# Patient Record
Sex: Female | Born: 1940
Health system: Southern US, Community
[De-identification: ages and names within clinical notes are randomized; demographics above are authoritative.]

## PROBLEM LIST (undated history)

## (undated) DIAGNOSIS — M25561 Pain in right knee: Secondary | ICD-10-CM

## (undated) DIAGNOSIS — I5181 Takotsubo syndrome: Secondary | ICD-10-CM

## (undated) DIAGNOSIS — R002 Palpitations: Secondary | ICD-10-CM

## (undated) DIAGNOSIS — H269 Unspecified cataract: Secondary | ICD-10-CM

## (undated) DIAGNOSIS — M1990 Unspecified osteoarthritis, unspecified site: Secondary | ICD-10-CM

## (undated) DIAGNOSIS — T7840XA Allergy, unspecified, initial encounter: Secondary | ICD-10-CM

## (undated) DIAGNOSIS — D649 Anemia, unspecified: Secondary | ICD-10-CM

## (undated) DIAGNOSIS — N3000 Acute cystitis without hematuria: Secondary | ICD-10-CM

## (undated) DIAGNOSIS — R778 Other specified abnormalities of plasma proteins: Secondary | ICD-10-CM

## (undated) DIAGNOSIS — E785 Hyperlipidemia, unspecified: Secondary | ICD-10-CM

## (undated) DIAGNOSIS — G9389 Other specified disorders of brain: Secondary | ICD-10-CM

## (undated) DIAGNOSIS — I10 Essential (primary) hypertension: Secondary | ICD-10-CM

## (undated) DIAGNOSIS — A86 Unspecified viral encephalitis: Secondary | ICD-10-CM

## (undated) DIAGNOSIS — I340 Nonrheumatic mitral (valve) insufficiency: Secondary | ICD-10-CM

## (undated) DIAGNOSIS — I071 Rheumatic tricuspid insufficiency: Secondary | ICD-10-CM

## (undated) DIAGNOSIS — I2510 Atherosclerotic heart disease of native coronary artery without angina pectoris: Secondary | ICD-10-CM

## (undated) DIAGNOSIS — J45909 Unspecified asthma, uncomplicated: Secondary | ICD-10-CM

## (undated) DIAGNOSIS — R7989 Other specified abnormal findings of blood chemistry: Secondary | ICD-10-CM

## (undated) HISTORY — DX: Other specified disorders of brain: G93.89

## (undated) HISTORY — DX: Nonrheumatic mitral (valve) insufficiency: I34.0

## (undated) HISTORY — DX: Unspecified asthma, uncomplicated: J45.909

## (undated) HISTORY — PX: EYE SURGERY: SHX253

## (undated) HISTORY — PX: CHOLECYSTECTOMY: SHX55

## (undated) HISTORY — DX: Allergy, unspecified, initial encounter: T78.40XA

## (undated) HISTORY — DX: Takotsubo syndrome: I51.81

## (undated) HISTORY — DX: Anemia, unspecified: D64.9

## (undated) HISTORY — DX: Pain in right knee: M25.561

## (undated) HISTORY — DX: Acute cystitis without hematuria: N30.00

## (undated) HISTORY — DX: Unspecified cataract: H26.9

## (undated) HISTORY — PX: TUBAL LIGATION: SHX77

## (undated) HISTORY — PX: APPENDECTOMY: SHX54

## (undated) HISTORY — DX: Rheumatic tricuspid insufficiency: I07.1

## (undated) HISTORY — DX: Palpitations: R00.2

## (undated) HISTORY — DX: Essential (primary) hypertension: I10

## (undated) HISTORY — DX: Unspecified viral encephalitis: A86

---

## 1998-08-10 ENCOUNTER — Ambulatory Visit (HOSPITAL_COMMUNITY): Admission: RE | Admit: 1998-08-10 | Discharge: 1998-08-10 | Payer: Self-pay | Admitting: *Deleted

## 1998-08-10 ENCOUNTER — Encounter: Payer: Self-pay | Admitting: *Deleted

## 1998-08-25 ENCOUNTER — Encounter: Payer: Self-pay | Admitting: Surgery

## 1998-08-27 ENCOUNTER — Encounter: Payer: Self-pay | Admitting: Surgery

## 1998-08-27 ENCOUNTER — Observation Stay (HOSPITAL_COMMUNITY): Admission: RE | Admit: 1998-08-27 | Discharge: 1998-08-28 | Payer: Self-pay | Admitting: Surgery

## 1998-08-28 ENCOUNTER — Encounter: Payer: Self-pay | Admitting: Surgery

## 2009-01-09 HISTORY — PX: DOPPLER ECHOCARDIOGRAPHY: SHX263

## 2011-04-29 ENCOUNTER — Ambulatory Visit (INDEPENDENT_AMBULATORY_CARE_PROVIDER_SITE_OTHER): Payer: PRIVATE HEALTH INSURANCE | Admitting: Family Medicine

## 2011-04-29 VITALS — BP 104/70 | HR 102 | Temp 98.6°F | Resp 16 | Ht 59.0 in | Wt 94.0 lb

## 2011-04-29 DIAGNOSIS — J45909 Unspecified asthma, uncomplicated: Secondary | ICD-10-CM

## 2011-04-29 DIAGNOSIS — J683 Other acute and subacute respiratory conditions due to chemicals, gases, fumes and vapors: Secondary | ICD-10-CM

## 2011-04-29 DIAGNOSIS — J019 Acute sinusitis, unspecified: Secondary | ICD-10-CM

## 2011-04-29 DIAGNOSIS — J329 Chronic sinusitis, unspecified: Secondary | ICD-10-CM

## 2011-04-29 MED ORDER — METHYLPREDNISOLONE 4 MG PO TABS: 4.0000 mg | ORAL_TABLET | Freq: Two times a day (BID) | ORAL | Status: AC

## 2011-04-29 MED ORDER — LEVOFLOXACIN 500 MG PO TABS: 500.0000 mg | ORAL_TABLET | Freq: Every day | ORAL | Status: AC

## 2011-04-29 MED ORDER — HYDROCODONE-HOMATROPINE 5-1.5 MG/5ML PO SYRP: 5.0000 mL | ORAL_SOLUTION | Freq: Three times a day (TID) | ORAL | Status: AC | PRN

## 2011-04-29 NOTE — Progress Notes (Signed)
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  Coughs to point of retching. h/o asthma O: NAD HEENT:  Unremarkable except for nasal congestion and small amt left epistaxis Neck: supple Chest:  Exp wheezes Heart:  Reg Ext: no edema  A: sinusitis, allergies with asthma  P: levaquin 500 mg qd x 7 Medrol 4 mg bid x 5 Hydromet

## 2011-04-29 NOTE — Patient Instructions (Signed)
Asthma, Acute Bronchospasm Your exam shows you have asthma, or acute bronchospasm that acts like asthma. Bronchospasm means your air passages become narrowed. These conditions are due to inflammation and airway spasm that cause narrowing of the bronchial tubes in the lungs. This causes you to have wheezing and shortness of breath. CAUSES  Respiratory infections and allergies most often bring on these attacks. Smoking, air pollution, cold air, emotional upsets, and vigorous exercise can also bring them on.  TREATMENT   Treatment is aimed at making the narrowed airways larger. Mild asthma/bronchospasm is usually controlled with inhaled medicines. Albuterol is a common medicine that you breathe in to open spastic or narrowed airways. Some trade names for albuterol are Ventolin or Proventil. Steroid medicine is also used to reduce the inflammation when an attack is moderate or severe. Antibiotics (medications used to kill germs) are only used if a bacterial infection is present.   If you are pregnant and need to use Albuterol (Ventolin or Proventil), you can expect the baby to move more than usual shortly after the medicine is used.  HOME CARE INSTRUCTIONS   Rest.   Drink plenty of liquids. This helps the mucus to remain thin and easily coughed up. Do not use caffeine or alcohol.   Do not smoke. Avoid being exposed to second-hand smoke.   You play a critical role in keeping yourself in good health. Avoid exposure to things that cause you to wheeze. Avoid exposure to things that cause you to have breathing problems. Keep your medications up-to-date and available. Carefully follow your doctor's treatment plan.   When pollen or pollution is bad, keep windows closed and use an air conditioner go to places with air conditioning. If you are allergic to furry pets or birds, find new homes for them or keep them outside.   Take your medicine exactly as prescribed.   Asthma requires careful medical  attention. See your caregiver for follow-up as advised. If you are more than [redacted] weeks pregnant and you were prescribed any new medications, let your Obstetrician know about the visit and how you are doing. Arrange a recheck.  SEEK IMMEDIATE MEDICAL CARE IF:   You are getting worse.   You have trouble breathing. If severe, call 911.   You develop chest pain or discomfort.   You are throwing up or not drinking fluids.   You are not getting better within 24 hours.   You are coughing up yellow, green, brown, or bloody sputum.   You develop a fever over 102 F (38.9 C).   You have trouble swallowing.  MAKE SURE YOU:   Understand these instructions.   Will watch your condition.   Will get help right away if you are not doing well or get worse.  Document Released: 04/12/2006 Document Revised: 12/15/2010 Document Reviewed: 12/10/2006 [facility] Patient Information 2012 [place], [place].Sinusitis Sinuses are air pockets within the bones of your face. The growth of bacteria within a sinus leads to infection. The infection prevents the sinuses from draining. This infection is called sinusitis. SYMPTOMS  There will be different areas of pain depending on which sinuses have become infected.  The maxillary sinuses often produce pain beneath the eyes.   Frontal sinusitis may cause pain in the middle of the forehead and above the eyes.  Other problems (symptoms) include:  Toothaches.   Colored, pus-like (purulent) drainage from the nose.   Swelling, warmth, and tenderness over the sinus areas may be signs of infection.  TREATMENT  Sinusitis  is most often determined by an exam.X-rays may be taken. If x-rays have been taken, make sure you obtain your results or find out how you are to obtain them. Your caregiver may give you medications (antibiotics). These are medications that will help kill the bacteria causing the infection. You may also be given a medication (decongestant) that helps to  reduce sinus swelling.  HOME CARE INSTRUCTIONS   Only take over-the-counter or prescription medicines for pain, discomfort, or fever as directed by your caregiver.   Drink extra fluids. Fluids help thin the mucus so your sinuses can drain more easily.   Applying either moist heat or ice packs to the sinus areas may help relieve discomfort.   Use saline nasal sprays to help moisten your sinuses. The sprays can be found at your local drugstore.  SEEK IMMEDIATE MEDICAL CARE IF:  You have a fever.   You have increasing pain, severe headaches, or toothache.   You have nausea, vomiting, or drowsiness.   You develop unusual swelling around the face or trouble seeing.  MAKE SURE YOU:   Understand these instructions.   Will watch your condition.   Will get help right away if you are not doing well or get worse.  Document Released: 12/26/2004 Document Revised: 12/15/2010 Document Reviewed: 07/25/2006 [facility] Patient Information 2012 [place], [place].

## 2011-07-12 ENCOUNTER — Ambulatory Visit: Payer: PRIVATE HEALTH INSURANCE

## 2011-07-12 ENCOUNTER — Ambulatory Visit (INDEPENDENT_AMBULATORY_CARE_PROVIDER_SITE_OTHER): Payer: PRIVATE HEALTH INSURANCE | Admitting: Family Medicine

## 2011-07-12 VITALS — BP 118/82 | HR 82 | Temp 97.9°F | Resp 16 | Ht <= 58 in | Wt 92.2 lb

## 2011-07-12 DIAGNOSIS — M25559 Pain in unspecified hip: Secondary | ICD-10-CM

## 2011-07-12 DIAGNOSIS — M79673 Pain in unspecified foot: Secondary | ICD-10-CM

## 2011-07-12 DIAGNOSIS — M79609 Pain in unspecified limb: Secondary | ICD-10-CM

## 2011-07-12 DIAGNOSIS — M25579 Pain in unspecified ankle and joints of unspecified foot: Secondary | ICD-10-CM

## 2011-07-12 NOTE — Patient Instructions (Signed)
Your xrays did not indicate a fracture today, however an MRI may be needed to completely exclude this.  Use a cane and keep as much weight off of the R leg for the next 4-5 days, then follow up to discuss next step in treatment.  If any worsening of pain - return sooner to myself or Dr. Cleta Alberts.   Your foot swelling could be a flair of arthritis or sprain. Occasionally superficial thrombophlebitis, or inflammation of the veins on the top of the foot can cause these symptoms. You cna try relative rest, elevation as needed, and warm compresses to this area.  If needed, tylenol or Ibuprofen over the counter temporarily is ok.  Return to the clinic or go to the nearest emergency room if any of your symptoms worsen or new symptoms occur.

## 2011-07-12 NOTE — Progress Notes (Signed)
  Subjective:    Patient ID: Julie Durham, female    DOB: [birth year], 71 y.o.   MRN: [id number]  HPI Julie Durham is a 71 y.o. female S/p fall 1 week ago - fell about 10 days ago working in yard.  Fell backward, landed on side of R hip and caught self with R arm. Foot may may have been turned out when fell.  Straightened out on own.   Able to get up and walk  - no initial pain. Some bruising next day. Soreness in R hip started about a week later.  Pain in front of hip - moves down part of . Has been using cane at times.  No known osteoporosis, last bone density 5-10 years ago.  Very active.    L ankle/foot swelling, sore on top of L foot past 4-5 days.  More swollen yesterday, and sore in top of foot since yesterday.  No known hx of Gout. No known injury.  No calf pain.    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   Review of Systems     Objective:   Physical Exam  Constitutional: She is oriented to person, place, and time. She appears well-developed and well-nourished.  HENT:  Head: Normocephalic and atraumatic.  Cardiovascular: Normal rate, regular rhythm, normal heart sounds and intact distal pulses.   Pulmonary/Chest: Effort normal and breath sounds normal.  Musculoskeletal:       Right hip: She exhibits tenderness. She exhibits normal range of motion (however has pain with terminal hip flexion and internal rotation - reproduces radiating pain from hip to upper leg.) and normal strength.       Legs:      Feet:  Neurological: She is alert and oriented to person, place, and time. No sensory deficit.       NVI distally.   Skin: Skin is warm and dry. No rash noted. No erythema.  Psychiatric: She has a normal mood and affect. Her behavior is normal. Thought content normal.     [facility] reading (PRIMARY) by  Dr. Neva Seat:  R hip - faint linear marking in femoral neck-  perpendicular to cortex.  No apparent fx or displacement otherwise.   L foot/ankle: negative for acute fx.    X ray reports  reviewed.  No fracture.      Assessment & Plan:  Julie Durham is a 71 y.o. female 1. Foot pain  DG Foot Complete Left  2. Hip pain  DG Hip Complete Right  3. Ankle pain  DG Ankle Complete Left   Hip pain. Concerning for fracture based on symptoms, however has been walking on this for the past week to 10 days with minimal limp. Initial x-ray appears negative,  But recommended MRI to rule out fracture.  Patient like to wait for 4-5 more days and if symptoms not improving consider MRI at that point. Discussed risks of possible fracture without treatment.,  understanding expressed.  She plans on minimal activity for the next 4 or 5 days and then a recheck, but will return to clinic in the next few days if any increase in pain.   Ankle pain, foot pain and swelling. Possible flare of degenerative joint disease or strain. Doubt gout no apparent fracture. Trial of postop shoe Tylenol and/or ibuprofen over-the-counter short-term. Recheck for 5 days

## 2011-07-17 ENCOUNTER — Telehealth: Payer: Self-pay

## 2011-07-17 NOTE — Telephone Encounter (Signed)
PT STATES DR Neva Seat WANTED HER TO CALL HIM ABOUT HER HAVING AN MRI AND ALSO ABOUT HER HIP AND FOOT ARE DURING PLEASE CALL [phone]

## 2011-07-18 NOTE — Telephone Encounter (Signed)
Pt reports that she is doing better than when she was here. She is able to put some weight on her hip now, but is still using the cane to continue to give it rest. Also her swelling is gone in her foot, but still has some pain on the top of her foot and will cont to wear the foot support for at least a week. Pt wanted to req that Dr Neva Seat look at her xray again since he thought he saw a possible hairline fx even thought the radiologist didn't see one, and would like a copy of her xray. She would like a CB after Dr Neva Seat rechecks her xray and to let her know if he has to recheck her even though she has improved (she only remembered that she was supposed to call him w/update, not that he wanted to have her RTC. Asking XRAY for a copy for pt.

## 2011-07-18 NOTE — Telephone Encounter (Signed)
LMOM to CB. Xray and report are ready for p/up, and give her message from Dr Neva Seat.

## 2011-07-18 NOTE — Telephone Encounter (Signed)
Pt CB. Gave her message from Dr Neva Seat and she agreed to RTC if she conts to have pain to be assessed for need for MRI. Advised pt that her xray and report are ready for p/up

## 2011-07-18 NOTE — Telephone Encounter (Signed)
X ray report did not indicate a fracture, but with where she was having pain, that is why I recommended an MRI.  If she is still having pain in this area - especially with weightbearing - I would recommend an MRI.  We can provide her a copy of the last xray and report if needed.

## 2011-07-20 ENCOUNTER — Telehealth: Payer: Self-pay

## 2011-07-20 NOTE — Telephone Encounter (Signed)
LMOM for pt at [facility] that handicapped parking application is ready for p/up

## 2012-04-13 ENCOUNTER — Ambulatory Visit (INDEPENDENT_AMBULATORY_CARE_PROVIDER_SITE_OTHER): Payer: PRIVATE HEALTH INSURANCE | Admitting: Emergency Medicine

## 2012-04-13 VITALS — BP 123/82 | HR 112 | Temp 98.0°F | Resp 18 | Wt 85.0 lb

## 2012-04-13 DIAGNOSIS — I479 Paroxysmal tachycardia, unspecified: Secondary | ICD-10-CM

## 2012-04-13 LAB — COMPREHENSIVE METABOLIC PANEL
ALT: 13 U/L (ref 0–35)
AST: 18 U/L (ref 0–37)
Albumin: 4.3 g/dL (ref 3.5–5.2)
Alkaline Phosphatase: 106 U/L (ref 39–117)
BUN: 12 mg/dL (ref 6–23)
CO2: 30 mEq/L (ref 19–32)
Calcium: 9.5 mg/dL (ref 8.4–10.5)
Chloride: 99 mEq/L (ref 96–112)
Creat: 0.65 mg/dL (ref 0.50–1.10)
Glucose, Bld: 93 mg/dL (ref 70–99)
Potassium: 3.8 mEq/L (ref 3.5–5.3)
Sodium: 139 mEq/L (ref 135–145)
Total Bilirubin: 0.4 mg/dL (ref 0.3–1.2)
Total Protein: 7 g/dL (ref 6.0–8.3)

## 2012-04-13 LAB — POCT CBC
Granulocyte percent: 66.9 %G (ref 37–80)
HCT, POC: 43.4 % (ref 37.7–47.9)
Hemoglobin: 13.9 g/dL (ref 12.2–16.2)
Lymph, poc: 1.9 (ref 0.6–3.4)
MCH, POC: 28.8 pg (ref 27–31.2)
MCHC: 32 g/dL (ref 31.8–35.4)
MCV: 89.8 fL (ref 80–97)
MID (cbc): 0.5 (ref 0–0.9)
MPV: 8.3 fL (ref 0–99.8)
POC Granulocyte: 4.7 (ref 2–6.9)
POC LYMPH PERCENT: 26.5 %L (ref 10–50)
POC MID %: 6.6 %M (ref 0–12)
Platelet Count, POC: 339 10*3/uL (ref 142–424)
RBC: 4.83 M/uL (ref 4.04–5.48)
RDW, POC: 12 %
WBC: 7 10*3/uL (ref 4.6–10.2)

## 2012-04-13 LAB — TSH: TSH: 2.265 u[IU]/mL (ref 0.350–4.500)

## 2012-04-13 NOTE — Patient Instructions (Addendum)
Nonspecific Tachycardia  Tachycardia is a faster than normal heartbeat (more than 100 beats per minute). In adults, the heart normally beats between 60 and 100 times a minute. A fast heartbeat may be a normal response to exercise or stress. It does not necessarily mean that something is wrong. However, sometimes when your heart beats too fast it may not be able to pump enough blood to the rest of your body. This can result in chest pain, shortness of breath, dizziness, and even fainting. Nonspecific tachycardia means that the specific cause or pattern of your tachycardia is unknown.  CAUSES   Tachycardia may be harmless or it may be due to a more serious underlying cause. Possible causes of tachycardia include:   Exercise or exertion.   Fever.   Pain or injury.   Infection.   Loss of body fluids (dehydration).   Overactive thyroid.   Lack of red blood cells (anemia).   Anxiety and stress.   Alcohol.   Caffeine.   Tobacco products.   Diet pills.   Illegal drugs.   Heart disease.  SYMPTOMS   Rapid or irregular heartbeat (palpitations).   Suddenly feeling your heart beating (cardiac awareness).   Dizziness.   Tiredness (fatigue).   Shortness of breath.   Chest pain.   Nausea.   Fainting.  DIAGNOSIS   Your caregiver will perform a physical exam and take your medical history. In some cases, a heart specialist (cardiologist) may be consulted. Your caregiver may also order:   Blood tests.   Electrocardiography. This test records the electrical activity of your heart.   A heart monitoring test.  TREATMENT   Treatment will depend on the likely cause of your tachycardia. The goal is to treat the underlying cause of your tachycardia. Treatment methods may include:   Replacement of fluids or blood through an intravenous (IV) tube for moderate to severe dehydration or anemia.   New medicines or changes in your current medicines.   Diet and lifestyle changes.   Treatment for certain  infections.   Stress relief or relaxation methods.  HOME CARE INSTRUCTIONS    Rest.   Drink enough fluids to keep your urine clear or pale yellow.   Do not smoke.   Avoid:   Caffeine.   Tobacco.   Alcohol.   Chocolate.   Stimulants such as over-the-counter diet pills or pills that help you stay awake.   Situations that cause anxiety or stress.   Illegal drugs such as marijuana, phencyclidine (PCP), and cocaine.   Only take medicine as directed by your caregiver.   Keep all follow-up appointments as directed by your caregiver.  SEEK IMMEDIATE MEDICAL CARE IF:    You have pain in your chest, upper arms, jaw, or neck.   You become weak, dizzy, or feel faint.   You have palpitations that will not go away.   You vomit, have diarrhea, or pass blood in your stool.   Your skin is cool, pale, and wet.   You have a fever that will not go away with rest, fluids, and medicine.  MAKE SURE YOU:    Understand these instructions.   Will watch your condition.   Will get help right away if you are not doing well or get worse.  Document Released: 02/03/2004 Document Revised: 03/20/2011 Document Reviewed: 12/06/2010  [employer] Patient Information 2013 [employer].

## 2012-04-13 NOTE — Progress Notes (Signed)
[facility] [address] [place] [address] [phone]  Date:  04/13/2012   Name:  Julie Durham   DOB:  February 20, 1940   MRN:  [id number]  PCP:  Elvina Sidle, MD    Chief Complaint: Hypertension   History of Present Illness:  Julie Durham is a 72 y.o. very pleasant female patient who presents with the following:  Says has intermittent episodes of tachycardia and increased blood pressure.  Says that she has experienced increased symptoms of asthma and shortness of breath. Relies on her rescue inhaler more frequently and thinks that may be related to her symptoms.  Denies antecedent illness or fever or chills. Denies cough or sputum production.  No nausea or vomiting.  No stool change or rash.  No improvement with over the counter medications or other home remedies. Denies other complaint or health concern today.   There is no problem list on file for this patient.   No past medical history on file.  No past surgical history on file.  History  Substance Use Topics  . Smoking status: Never Smoker   . Smokeless tobacco: Not on file  . Alcohol Use: Not on file    No family history on file.  Allergies  Allergen Reactions  . Demerol   . Sulfa Antibiotics     Medication list has been reviewed and updated.  Current Outpatient Prescriptions on File Prior to Visit  Medication Sig Dispense Refill  . albuterol (PROVENTIL HFA;VENTOLIN HFA) 108 (90 BASE) MCG/ACT inhaler Inhale 2 puffs into the lungs every 6 (six) hours as needed.      Marland Kitchen amLODipine (NORVASC) 2.5 MG tablet Take 2.5 mg by mouth daily.      . beclomethasone (QVAR) 40 MCG/ACT inhaler Inhale 2 puffs into the lungs 2 (two) times daily.      . hydrochlorothiazide (MICROZIDE) 12.5 MG capsule Take 12.5 mg by mouth daily.      . valsartan (DIOVAN) 160 MG tablet Take 160 mg by mouth daily.       No current facility-administered medications on file prior to visit.    Review of Systems:  As  per HPI, otherwise negative.    Physical Examination: Filed Vitals:   04/13/12 1106  BP: 123/82  Pulse: 112  Temp: 98 F (36.7 C)  Resp: 18   Filed Vitals:   04/13/12 1106  Weight: 85 lb (38.556 kg)   Body mass index is 17.77 kg/(m^2). Ideal Body Weight:    GEN: WDWN, NAD, Non-toxic, A & O x 3 HEENT: Atraumatic, Normocephalic. Neck supple. No masses, No LAD. Ears and Nose: No external deformity. CV: RRR, No M/G/R. No JVD. No thrill. No extra heart sounds. PULM: CTA B, no wheezes, crackles, rhonchi. No retractions. No resp. distress. No accessory muscle use. ABD: S, NT, ND, +BS. No rebound. No HSM. EXTR: No c/c/e NEURO Normal gait.  PSYCH: Normally interactive. Conversant. Not depressed or anxious appearing.  Calm demeanor.    Assessment and Plan: Sinus tachycardia Follow up after lab results   Signed,  Phillips Odor, MD   Results for orders placed in visit on 04/13/12  POCT CBC      Result Value Range   WBC 7.0  4.6 - 10.2 K/uL   Lymph, poc 1.9  0.6 - 3.4   POC LYMPH PERCENT 26.5  10 - 50 %L   MID (cbc) 0.5  0 - 0.9   POC MID % 6.6  0 - 12 %M  POC Granulocyte 4.7  2 - 6.9   Granulocyte percent 66.9  37 - 80 %G   RBC 4.83  4.04 - 5.48 M/uL   Hemoglobin 13.9  12.2 - 16.2 g/dL   HCT, POC 16.1  09.6 - 47.9 %   MCV 89.8  80 - 97 fL   MCH, POC 28.8  27 - 31.2 pg   MCHC 32.0  31.8 - 35.4 g/dL   RDW, POC 04.5     Platelet Count, POC 339  142 - 424 K/uL   MPV 8.3  0 - 99.8 fL

## 2012-04-15 ENCOUNTER — Telehealth: Payer: Self-pay

## 2012-04-15 NOTE — Telephone Encounter (Signed)
Pt is returning our call, she thinks we may have called her with her lab results Please call pt to advise

## 2012-04-15 NOTE — Telephone Encounter (Signed)
Labs normal, patient to follow up with cardiology. She has appt on Wed for this.

## 2012-04-24 ENCOUNTER — Other Ambulatory Visit (HOSPITAL_COMMUNITY): Payer: Self-pay | Admitting: Physician Assistant

## 2012-04-24 DIAGNOSIS — R0789 Other chest pain: Secondary | ICD-10-CM

## 2012-05-01 ENCOUNTER — Ambulatory Visit (HOSPITAL_COMMUNITY)
Admission: RE | Admit: 2012-05-01 | Discharge: 2012-05-01 | Disposition: A | Discharge status: Home / Self Care | Payer: PRIVATE HEALTH INSURANCE | Source: Ambulatory Visit | Attending: Physician Assistant | Admitting: Physician Assistant

## 2012-05-01 DIAGNOSIS — R0789 Other chest pain: Secondary | ICD-10-CM

## 2012-05-01 MED ORDER — TECHNETIUM TC 99M SESTAMIBI GENERIC - CARDIOLITE
10.5000 | Freq: Once | INTRAVENOUS | Status: AC | PRN
  Administered 2012-05-01: 11 via INTRAVENOUS

## 2012-05-01 MED ORDER — TECHNETIUM TC 99M SESTAMIBI GENERIC - CARDIOLITE
31.7000 | Freq: Once | INTRAVENOUS | Status: AC | PRN
  Administered 2012-05-01: 31.7 via INTRAVENOUS

## 2012-05-01 NOTE — Procedures (Addendum)
[facility] [address] [place] [address] [place] [address] [phone]  Cardiology Nuclear Med Study  Julie Durham is a 72 y.o. female     MRN : [id number]     DOB: 03-27-1940  Procedure Date: 05/01/2012  Nuclear Med Background Indication for Stress Test:  Evaluation for Ischemia and Abnormal EKG History:  Asthma Cardiac Risk Factors: Family History - CAD, Hypertension and Lipids  Symptoms:  Chest Pain, Dizziness, Fatigue, Light-Headedness, Palpitations and SOB   Nuclear Pre-Procedure Caffeine/Decaff Intake:  7:00pm NPO After: 5:00am   IV Site: R Antecubital  IV 0.9% NS with Angio Cath:  22g  Chest Size (in):  N/A IV Started by: Emmit Pomfret, RN  Height: 5' (1.524 m)  Cup Size: B  BMI:  Body mass index is 16.99 kg/(m^2). Weight:  87 lb (39.463 kg)   Tech Comments:  N/A    Nuclear Med Study 1 or 2 day study: 1 day  Stress Test Type:  Stress  Order Authorizing Provider:  Nanetta Batty, MD   Resting Radionuclide: Technetium 80m Sestamibi  Resting Radionuclide Dose: 10.5 mCi   Stress Radionuclide:  Technetium 64m Sestamibi  Stress Radionuclide Dose: 31.7 mCi           Stress Protocol Rest HR: 93 Stress HR:157  Rest BP: 152/76 Stress BP: 162/93  Exercise Time (min): 5:01 METS: 7.00   Predicted Max HR: 148 bpm % Max HR: 106.08 bpm Rate Pressure Product: 21308  Dose of Adenosine (mg):  n/a Dose of Lexiscan: n/a mg  Dose of Atropine (mg): n/a Dose of Dobutamine: n/a mcg/kg/min (at max HR)  Stress Test Technologist: Ernestene Mention, CCT Nuclear Technologist: Gonzella Lex, CNMT   Rest Procedure:  Myocardial perfusion imaging was performed at rest 45 minutes following the intravenous administration of Technetium 63m Sestamibi. Stress Procedure:  The patient performed treadmill exercise using a Bruce  Protocol for 5 minutes and 1 second. The patient stopped due to shortness of breath and fatigue. Patient denied any chest pain.   There were no significant ST-T wave changes.  Technetium 57m Sestamibi was injected at peak exercise and myocardial perfusion imaging was performed after a brief delay.  Transient Ischemic Dilatation (Normal <1.22):  1.07 Lung/Heart Ratio (Normal <0.45):  0.28 QGS EDV:  28 ml QGS ESV:  2 ml LV Ejection Fraction: 92%  Signed by       Rest ECG: NSR - Normal EKG  Stress ECG: No significant change from baseline ECG  QPS Raw Data Images:  Normal; no motion artifact; normal heart/lung ratio. Stress Images:  Normal homogeneous uptake in all areas of the myocardium. Rest Images:  Normal homogeneous uptake in all areas of the myocardium. Subtraction (SDS):  No evidence of ischemia.  Impression Exercise Capacity:  Good exercise capacity. BP Response:  Normal blood pressure response. Clinical Symptoms:  No significant symptoms noted. ECG Impression:  No significant ST segment change suggestive of ischemia. Comparison with Prior Nuclear Study: No significant change from previous study  Overall Impression:  Normal stress nuclear study.  LV Wall Motion:  NL LV Function; NL Wall Motion   Runell Gess, MD  05/01/2012 3:58 PM

## 2012-08-12 ENCOUNTER — Ambulatory Visit (INDEPENDENT_AMBULATORY_CARE_PROVIDER_SITE_OTHER): Payer: PRIVATE HEALTH INSURANCE | Admitting: Emergency Medicine

## 2012-08-12 VITALS — BP 102/68 | HR 74 | Temp 98.3°F | Resp 16 | Ht 58.25 in | Wt 83.2 lb

## 2012-08-12 DIAGNOSIS — H9209 Otalgia, unspecified ear: Secondary | ICD-10-CM

## 2012-08-12 DIAGNOSIS — H6990 Unspecified Eustachian tube disorder, unspecified ear: Secondary | ICD-10-CM

## 2012-08-12 DIAGNOSIS — H6991 Unspecified Eustachian tube disorder, right ear: Secondary | ICD-10-CM

## 2012-08-12 DIAGNOSIS — H9202 Otalgia, left ear: Secondary | ICD-10-CM

## 2012-08-12 MED ORDER — FLUTICASONE PROPIONATE 50 MCG/ACT NA SUSP: 2.0000 | Freq: Every day | NASAL | Status: DC

## 2012-08-12 NOTE — Patient Instructions (Addendum)
Barotitis Media Barotitis media is soreness (inflammation) of the area behind the eardrum (middle ear). This occurs when the auditory tube (Eustachian tube) leading from the back of the throat to the eardrum is blocked. When it is blocked air cannot move in and out of the middle ear to equalize pressure changes. These pressure changes come from changes in altitude when:  Flying.  Driving in the mountains.  Diving. Problems are more likely to occur with pressure changes during times when you are congested as from:  Hay fever.  Upper respiratory infection.  A cold. Damage or hearing loss (barotrauma) caused by this may be permanent. HOME CARE INSTRUCTIONS   Use medicines as recommended by your caregiver. Over the counter medicines will help unblock the canal and can help during times of air travel.  Do not put anything into your ears to clean or unplug them. Eardrops will not be helpful.  Do not swim, dive, or fly until your caregiver says it is all right to do so. If these activities are necessary, chewing gum with frequent swallowing may help. It is also helpful to hold your nose and gently blow to pop your ears for equalizing pressure changes. This forces air into the Eustachian tube.  For little ones with problems, give your baby a bottle of water or juice during periods when pressure changes would be anticipated such as during take offs and landings associated with air travel.  Only take over-the-counter or prescription medicines for pain, discomfort, or fever as directed by your caregiver.  A decongestant may be helpful in de-congesting the middle ear and make pressure equalization easier. This can be even more effective if the drops (spray) are delivered with the head lying over the edge of a bed with the head tilted toward the ear on the affected side.  If your caregiver has given you a follow-up appointment, it is very important to keep that appointment. Not keeping the  appointment could result in a chronic or permanent injury, pain, hearing loss and disability. If there is any problem keeping the appointment, you must call back to this facility for assistance. SEEK IMMEDIATE MEDICAL CARE IF:   You develop a severe headache, dizziness, severe ear pain, or bloody or pus-like drainage from your ears.  An oral temperature above 102 F (38.9 C) develops.  Your problems do not improve or become worse. MAKE SURE YOU:   Understand these instructions.  Will watch your condition.  Will get help right away if you are not doing well or get worse. Document Released: 12/24/1999 Document Revised: 03/20/2011 Document Reviewed: 08/01/2007 [employer] Patient Information 2014 [employer].  

## 2012-08-12 NOTE — Progress Notes (Signed)
[facility] [address] [place] [address] [phone]  Date:  08/12/2012   Name:  Julie Durham   DOB:  06/06/1940   MRN:  [id number]  PCP:  Elvina Sidle, MD    Chief Complaint: Otalgia   History of Present Illness:  Julie Durham is a 72 y.o. very pleasant female patient who presents with the following:  Pain in LEFT ear.  No cough or coryza nor fever or chills. No nausea or vomiting.  No improvement with over the counter medications or other home remedies. Denies other complaint or health concern today.   There are no active problems to display for this patient.   Past Medical History  Diagnosis Date  . Hypertension   . Allergy   . Anemia   . Asthma   . Cataract     Past Surgical History  Procedure Laterality Date  . Appendectomy    . Cholecystectomy    . Eye surgery    . Tubal ligation      History  Substance Use Topics  . Smoking status: Never Smoker   . Smokeless tobacco: Not on file  . Alcohol Use: Not on file    Family History  Problem Relation Age of Onset  . Alzheimer's disease Mother   . Heart attack Father   . Heart disease Brother   . Diabetes Son   . Sensorineural hearing loss Sister   . Heart disease Brother   . Heart disease Brother   . Thyroid disease Brother     Allergies  Allergen Reactions  . Shellfish Allergy Anaphylaxis  . Demerol   . Sulfa Antibiotics     Medication list has been reviewed and updated.  Current Outpatient Prescriptions on File Prior to Visit  Medication Sig Dispense Refill  . albuterol (PROVENTIL HFA;VENTOLIN HFA) 108 (90 BASE) MCG/ACT inhaler Inhale 2 puffs into the lungs every 6 (six) hours as needed.      Marland Kitchen amLODipine (NORVASC) 2.5 MG tablet Take 2.5 mg by mouth daily.      . beclomethasone (QVAR) 40 MCG/ACT inhaler Inhale 2 puffs into the lungs 2 (two) times daily.      . hydrochlorothiazide (MICROZIDE) 12.5 MG capsule Take 12.5 mg by mouth daily.      . valsartan  (DIOVAN) 160 MG tablet Take 160 mg by mouth daily.       No current facility-administered medications on file prior to visit.    Review of Systems:  As per HPI, otherwise negative.    Physical Examination: Filed Vitals:   08/12/12 1021  BP: 102/68  Pulse: 74  Temp: 98.3 F (36.8 C)  Resp: 16   Filed Vitals:   08/12/12 1021  Height: 4' 10.25" (1.48 m)  Weight: 83 lb 3.2 oz (37.739 kg)   Body mass index is 17.23 kg/(m^2). Ideal Body Weight: Weight in (lb) to have BMI = 25: 120.4  GEN: WDWN, NAD, Non-toxic, A & O x 3 HEENT: Atraumatic, Normocephalic. Neck supple. No masses, No LAD. Ears and Nose: No external deformity.  RIGHT valsalva negative CV: RRR, No M/G/R. No JVD. No thrill. No extra heart sounds. PULM: CTA B, no wheezes, crackles, rhonchi. No retractions. No resp. distress. No accessory muscle use. ABD: S, NT, ND, +BS. No rebound. No HSM. EXTR: No c/c/e NEURO Normal gait.  PSYCH: Normally interactive. Conversant. Not depressed or anxious appearing.  Calm demeanor.    Assessment and Plan: Eustachian tube dysfunction Coricidin HBP flonase  Signed,  Ellison Carwin, MD

## 2012-08-27 ENCOUNTER — Other Ambulatory Visit: Payer: Self-pay | Admitting: Cardiovascular Disease

## 2012-10-05 ENCOUNTER — Other Ambulatory Visit: Payer: Self-pay | Admitting: Cardiovascular Disease

## 2012-10-07 NOTE — Telephone Encounter (Signed)
Rx was sent to pharmacy electronically. 

## 2012-11-18 ENCOUNTER — Encounter: Payer: Self-pay | Admitting: Cardiovascular Disease

## 2012-11-18 ENCOUNTER — Ambulatory Visit (INDEPENDENT_AMBULATORY_CARE_PROVIDER_SITE_OTHER): Payer: PRIVATE HEALTH INSURANCE | Admitting: Cardiovascular Disease

## 2012-11-18 VITALS — BP 160/88 | HR 67 | Ht 59.0 in | Wt 84.7 lb

## 2012-11-18 DIAGNOSIS — I10 Essential (primary) hypertension: Secondary | ICD-10-CM | POA: Insufficient documentation

## 2012-11-18 DIAGNOSIS — R002 Palpitations: Secondary | ICD-10-CM | POA: Insufficient documentation

## 2012-11-18 MED ORDER — VALSARTAN 160 MG PO TABS: 160.0000 mg | ORAL_TABLET | Freq: Every day | ORAL | Status: DC

## 2012-11-18 MED ORDER — METOPROLOL TARTRATE 25 MG PO TABS: 12.5000 mg | ORAL_TABLET | Freq: Two times a day (BID) | ORAL | Status: DC

## 2012-11-18 NOTE — Assessment & Plan Note (Signed)
She has been off her Norvasc and hydrochlorothiazide since August. Blood pressure is under reasonable control on low dose beta blocker and Diovan.

## 2012-11-18 NOTE — Patient Instructions (Signed)
Your physician wants you to follow-up in: 1 year with Dr Berry. You will receive a reminder letter in the mail two months in advance. If you don't receive a letter, please call our office to schedule the follow-up appointment.  

## 2012-11-18 NOTE — Progress Notes (Signed)
11/18/2012 Julie Durham   01/25/1940  [id number]  Primary Physician Elvina Sidle, MD Primary Cardiologist: Runell Gess MD Julie Durham   HPI:  The patient is a 72 year old, thin and fit appearing, Caucasian female mother of 2, grandmother to 2 grandchildren who has worked in [employer] department of [employer] for the last 20 years. I saw her a year ago. Her problems include mild hyperlipidemia, hypertension and a strong family history for heart disease. Her father died of an MI at age 95, and 2 brothers had MIs in their 72s and 100s, as well as a sister who had stents. She has never had a heart attack or stroke and denies chest pain or shortness of breath. She had a Myoview performed a year ago which was normal, and Dopplers involving her renal arteries, carotids and lower extremities all of which were normal as well. Recent lab work revealed a total cholesterol of 173, LDL of 101, HDL of 48.since I saw her back a year ago she's been asymptomatic patient hasn't tachypalpitations that was begun a low-dose beta blocker. She has not taken her hydrochlorothiazide or low-dose amlodipine since August and feels currently better.     Current Outpatient Prescriptions  Medication Sig Dispense Refill  . albuterol (PROVENTIL HFA;VENTOLIN HFA) 108 (90 BASE) MCG/ACT inhaler Inhale 2 puffs into the lungs every 6 (six) hours as needed.      Marland Kitchen aspirin EC 81 MG tablet Take 81 mg by mouth daily.      . beclomethasone (QVAR) 40 MCG/ACT inhaler Inhale 2 puffs into the lungs 2 (two) times daily.      . fluticasone (FLONASE) 50 MCG/ACT nasal spray Place 2 sprays into the nose daily.  16 g  12  . loratadine (CLARITIN) 10 MG tablet Take 10 mg by mouth daily as needed for allergies.      . metoprolol tartrate (LOPRESSOR) 25 MG tablet Take 12.5 mg by mouth 2 (two) times daily.      . Multiple Vitamins-Minerals (PRESERVISION AREDS 2) CAPS Take 1 capsule by mouth 2 (two) times daily.      .  naproxen sodium (ANAPROX) 220 MG tablet Take 220 mg by mouth as needed.      Marland Kitchen amLODipine (NORVASC) 2.5 MG tablet TAKE 1 TABLET DAILY.  30 tablet  6  . hydrochlorothiazide (MICROZIDE) 12.5 MG capsule TAKE 1 CAPSULE ONCE DAILY.  30 capsule  6   No current facility-administered medications for this visit.    Allergies  Allergen Reactions  . Shellfish Allergy Anaphylaxis  . Demerol   . Sulfa Antibiotics     History   Social History  . Marital Status: Legally Separated    Spouse Name: N/A    Number of Children: N/A  . Years of Education: N/A   Occupational History  . Not on file.   Social History Main Topics  . Smoking status: Never Smoker   . Smokeless tobacco: Never Used  . Alcohol Use: Not on file  . Drug Use: Not on file  . Sexual Activity: Not on file   Other Topics Concern  . Not on file   Social History Narrative  . No narrative on file     Review of Systems: General: negative for chills, fever, night sweats or weight changes.  Cardiovascular: negative for chest pain, dyspnea on exertion, edema, orthopnea, palpitations, paroxysmal nocturnal dyspnea or shortness of breath Dermatological: negative for rash Respiratory: negative for cough or wheezing Urologic: negative for hematuria  Abdominal: negative for nausea, vomiting, diarrhea, bright red blood per rectum, melena, or hematemesis Neurologic: negative for visual changes, syncope, or dizziness All other systems reviewed and are otherwise negative except as noted above.    Blood pressure 160/88, pulse 67, height 4\' 11"  (1.499 m), weight 84 lb 11.2 oz (38.42 kg).  General appearance: alert and no distress Neck: no adenopathy, no carotid bruit, no JVD, supple, symmetrical, trachea midline and thyroid not enlarged, symmetric, no tenderness/mass/nodules Lungs: clear to auscultation bilaterally Heart: regular rate and rhythm, S1, S2 normal, no murmur, click, rub or gallop Extremities: extremities normal,  atraumatic, no cyanosis or edema  EKG normal sinus rhythm at 67 with septal Q waves  ASSESSMENT AND PLAN:   Palpitations clinically improved on low-dose beta blocker  Essential hypertension She has been off her Norvasc and hydrochlorothiazide since August. Blood pressure is under reasonable control on low dose beta blocker and Diovan.      Runell Gess MD FACP,FACC,FAHA, [facility] 11/18/2012 5:13 PM

## 2012-11-18 NOTE — Assessment & Plan Note (Signed)
clinically improved on low-dose beta blocker

## 2012-11-19 ENCOUNTER — Encounter: Payer: Self-pay | Admitting: Cardiovascular Disease

## 2013-08-20 ENCOUNTER — Other Ambulatory Visit: Payer: Self-pay | Admitting: Emergency Medicine

## 2013-11-01 ENCOUNTER — Other Ambulatory Visit: Payer: Self-pay | Admitting: Cardiovascular Disease

## 2013-11-03 NOTE — Telephone Encounter (Signed)
Rx was sent to pharmacy electronically. 

## 2013-11-18 ENCOUNTER — Other Ambulatory Visit: Payer: Self-pay | Admitting: Cardiovascular Disease

## 2013-11-28 ENCOUNTER — Other Ambulatory Visit: Payer: Self-pay | Admitting: Cardiovascular Disease

## 2013-11-28 NOTE — Telephone Encounter (Signed)
Rx was sent to pharmacy electronically. OV 01/23/2014

## 2013-12-03 ENCOUNTER — Ambulatory Visit: Payer: PRIVATE HEALTH INSURANCE | Admitting: Cardiovascular Disease

## 2013-12-16 ENCOUNTER — Other Ambulatory Visit: Payer: Self-pay | Admitting: Cardiovascular Disease

## 2013-12-16 NOTE — Telephone Encounter (Signed)
Rx was sent to pharmacy electronically. OV 01/13/14

## 2014-01-18 ENCOUNTER — Other Ambulatory Visit: Payer: Self-pay | Admitting: Cardiovascular Disease

## 2014-01-22 ENCOUNTER — Encounter: Payer: Self-pay | Admitting: Cardiovascular Disease

## 2014-01-22 ENCOUNTER — Ambulatory Visit (INDEPENDENT_AMBULATORY_CARE_PROVIDER_SITE_OTHER): Payer: Medicare Other | Admitting: Cardiovascular Disease

## 2014-01-22 DIAGNOSIS — I10 Essential (primary) hypertension: Secondary | ICD-10-CM | POA: Diagnosis not present

## 2014-01-22 MED ORDER — METOPROLOL TARTRATE 25 MG PO TABS: 12.5000 mg | ORAL_TABLET | Freq: Two times a day (BID) | ORAL | Status: DC

## 2014-01-22 MED ORDER — VALSARTAN 160 MG PO TABS: ORAL_TABLET | ORAL | Status: DC

## 2014-01-22 NOTE — Patient Instructions (Signed)
Your physician wants you to follow-up in 1 year with Dr. Berry. You will receive a reminder letter in the mail 2 months in advance. If you do not receive a letter, please call our office to schedule the follow-up appointment.  

## 2014-01-22 NOTE — Progress Notes (Signed)
01/22/2014 Julie Durham   [birth year]  [id number]  Primary Physician Elvina Sidle, MD Primary Cardiologist: Runell Gess MD [name]   HPI:  The patient is a 74 year old, thin and fit appearing, Caucasian female mother of 2, grandmother to 2 grandchildren who has worked in [employer] department of [employer] for the last 23 years and she recently retired.. I saw her a year ago. Her problems include mild hyperlipidemia, hypertension and a strong family history for heart disease. Her father died of an MI at age 34, and 2 brothers had MIs in their 47s and 68s, as well as a sister who had stents. She has never had a heart attack or stroke and denies chest pain or shortness of breath. She had a Myoview performed a year ago which was normal, and Dopplers involving her renal arteries, carotids and lower extremities all of which were normal as well. Since I saw her back a year ago she's been asymptomatic patient hasn't tachypalpitations that was begun a low-dose beta blocker. She has not taken her hydrochlorothiazide or low-dose amlodipine since August and feels currently better.   Current Outpatient Prescriptions  Medication Sig Dispense Refill  . aspirin EC 81 MG tablet Take 81 mg by mouth daily.    Marland Kitchen DIOVAN 160 MG tablet TAKE 1 TABLET (160 MG TOTAL) BY MOUTH DAILY. 30 tablet 1  . loratadine (CLARITIN) 10 MG tablet Take 10 mg by mouth daily as needed for allergies.    . metoprolol tartrate (LOPRESSOR) 25 MG tablet Take 0.5 tablets (12.5 mg total) by mouth 2 (two) times daily. 30 tablet 1  . Multiple Vitamins-Minerals (PRESERVISION AREDS 2) CAPS Take 1 capsule by mouth 2 (two) times daily.     No current facility-administered medications for this visit.    Allergies  Allergen Reactions  . Shellfish Allergy Anaphylaxis  . Demerol   . Sulfa Antibiotics     History   Social History  . Marital Status: Legally Separated    Spouse Name: N/A    Number of  Children: N/A  . Years of Education: N/A   Occupational History  . Not on file.   Social History Main Topics  . Smoking status: Never Smoker   . Smokeless tobacco: Never Used  . Alcohol Use: Not on file  . Drug Use: Not on file  . Sexual Activity: Not on file   Other Topics Concern  . Not on file   Social History Narrative     Review of Systems: General: negative for chills, fever, night sweats or weight changes.  Cardiovascular: negative for chest pain, dyspnea on exertion, edema, orthopnea, palpitations, paroxysmal nocturnal dyspnea or shortness of breath Dermatological: negative for rash Respiratory: negative for cough or wheezing Urologic: negative for hematuria Abdominal: negative for nausea, vomiting, diarrhea, bright red blood per rectum, melena, or hematemesis Neurologic: negative for visual changes, syncope, or dizziness All other systems reviewed and are otherwise negative except as noted above.    Blood pressure 142/80, pulse 78, height  (1.499 m), weight 84 lb 11.2 oz (38.42 kg).  General appearance: alert and no distress Neck: no adenopathy, no carotid bruit, no JVD, supple, symmetrical, trachea midline and thyroid not enlarged, symmetric, no tenderness/mass/nodules Lungs: clear to auscultation bilaterally Heart: regular rate and rhythm, S1, S2 normal, no murmur, click, rub or gallop Extremities: extremities normal, atraumatic, no cyanosis or edema  EKG normal sinus rhythm at 78 with occasional PVC. I personally reviewed this EKG  ASSESSMENT  AND PLAN:   Palpitations On low-dose beta blocker. Occasional PVCs.   Essential hypertension Her blood pressure measured today was 142/80. She has a history of hypertension on Diovan and metoprolol. Continue current meds at current dosing       Runell GessJonathan J. Kelsen Celona MD [facility] HospitalFACP,FACC,FAHA, [facility] HospitalFSCAI 01/22/2014 4:06 PM

## 2014-01-22 NOTE — Assessment & Plan Note (Signed)
Her blood pressure measured today was 142/80. She has a history of hypertension on Diovan and metoprolol. Continue current meds at current dosing

## 2014-01-22 NOTE — Assessment & Plan Note (Signed)
On low-dose beta blocker. Occasional PVCs.

## 2014-01-23 ENCOUNTER — Ambulatory Visit: Payer: PRIVATE HEALTH INSURANCE | Admitting: Cardiovascular Disease

## 2014-04-06 DIAGNOSIS — H3531 Nonexudative age-related macular degeneration: Secondary | ICD-10-CM | POA: Diagnosis not present

## 2014-05-11 ENCOUNTER — Encounter: Payer: Self-pay | Admitting: *Deleted

## 2014-10-06 ENCOUNTER — Other Ambulatory Visit: Payer: Self-pay | Admitting: Cardiovascular Disease

## 2014-10-06 NOTE — Telephone Encounter (Signed)
REFILL 

## 2014-11-22 ENCOUNTER — Other Ambulatory Visit: Payer: Self-pay | Admitting: Cardiovascular Disease

## 2014-12-26 ENCOUNTER — Other Ambulatory Visit: Payer: Self-pay | Admitting: Cardiovascular Disease

## 2015-01-24 ENCOUNTER — Other Ambulatory Visit: Payer: Self-pay | Admitting: Cardiovascular Disease

## 2015-01-25 NOTE — Telephone Encounter (Signed)
REFILL 

## 2015-01-26 ENCOUNTER — Ambulatory Visit (INDEPENDENT_AMBULATORY_CARE_PROVIDER_SITE_OTHER): Payer: Medicare Other | Admitting: Cardiovascular Disease

## 2015-01-26 ENCOUNTER — Encounter: Payer: Self-pay | Admitting: Cardiovascular Disease

## 2015-01-26 VITALS — BP 124/80 | HR 74 | Ht 60.0 in | Wt 90.0 lb

## 2015-01-26 DIAGNOSIS — I10 Essential (primary) hypertension: Secondary | ICD-10-CM | POA: Diagnosis not present

## 2015-01-26 DIAGNOSIS — R002 Palpitations: Secondary | ICD-10-CM

## 2015-01-26 NOTE — Patient Instructions (Signed)
Medication Instructions:  Your physician recommends that you continue on your current medications as directed. Please refer to the Current Medication list given to you today.   Labwork: Your physician recommends that you return for lab work in: FASTING (lipid/liver) The lab can be found on the FIRST FLOOR of out building in [address]   Testing/Procedures: none  Follow-Up: Your physician wants you to follow-up in: 12 months with Dr. Berry. You will receive a reminder letter in the mail two months in advance. If you don't receive a letter, please call our office to schedule the follow-up appointment.   Any Other Special Instructions Will Be Listed Below (If Applicable).     If you need a refill on your cardiac medications before your next appointment, please call your pharmacy.   

## 2015-01-26 NOTE — Progress Notes (Signed)
01/26/2015 Julie Durham   01/17/1940  [id number]  Primary Physician Elvina Sidle, MD Primary Cardiologist: Runell Gess MD [name]   HPI:   The patient is a 75 year old, thin and fit appearing, Caucasian female mother of 2, grandmother to 2 grandchildren who has worked in [employer] department of [employer] for the last 23 years and she recently retired.. I saw her a year ago. Her problems include mild hyperlipidemia, hypertension and a strong family history for heart disease. Her father died of an MI at age 79, and 2 brothers had MIs in their 79s and 84s, as well as a sister who had stents. She has never had a heart attack or stroke and denies chest pain or shortness of breath. She had a Myoview performed a year ago which was normal, and Dopplers involving her renal arteries, carotids and lower extremities all of which were normal as well. Since I saw her back a year ago she's had 4-5 episodes of brief tachypalpitations which either spontaneously resolve resolve with taking a when necessary metoprolol.. She has not taken her hydrochlorothiazide or low-dose amlodipine since August and feels currently better.   Current Outpatient Prescriptions  Medication Sig Dispense Refill  . aspirin EC 81 MG tablet Take 81 mg by mouth daily.    Marland Kitchen loratadine (CLARITIN) 10 MG tablet Take 10 mg by mouth daily as needed for allergies.    . metoprolol tartrate (LOPRESSOR) 25 MG tablet TAKE 1/2 TABLET(12.5 MG) BY MOUTH TWICE DAILY 30 tablet 1  . Multiple Vitamins-Minerals (PRESERVISION AREDS 2) CAPS Take 1 capsule by mouth 2 (two) times daily.    . valsartan (DIOVAN) 160 MG tablet Take 1 tablet (160 mg total) by mouth daily. KEEP OV. 30 tablet 0   No current facility-administered medications for this visit.    Allergies  Allergen Reactions  . Shellfish Allergy Anaphylaxis  . Demerol   . Sulfa Antibiotics     Social History   Social History  . Marital Status: Legally  Separated    Spouse Name: N/A  . Number of Children: N/A  . Years of Education: N/A   Occupational History  . Not on file.   Social History Main Topics  . Smoking status: Never Smoker   . Smokeless tobacco: Never Used  . Alcohol Use: Not on file  . Drug Use: Not on file  . Sexual Activity: Not on file   Other Topics Concern  . Not on file   Social History Narrative     Review of Systems: General: negative for chills, fever, night sweats or weight changes.  Cardiovascular: negative for chest pain, dyspnea on exertion, edema, orthopnea, palpitations, paroxysmal nocturnal dyspnea or shortness of breath Dermatological: negative for rash Respiratory: negative for cough or wheezing Urologic: negative for hematuria Abdominal: negative for nausea, vomiting, diarrhea, bright red blood per rectum, melena, or hematemesis Neurologic: negative for visual changes, syncope, or dizziness All other systems reviewed and are otherwise negative except as noted above.    Blood pressure 124/80, pulse 74, height 5' (1.524 m), weight 90 lb (40.824 kg).  General appearance: alert and no distress Neck: no adenopathy, no carotid bruit, no JVD, supple, symmetrical, trachea midline and thyroid not enlarged, symmetric, no tenderness/mass/nodules Lungs: clear to auscultation bilaterally Heart: regular rate and rhythm, S1, S2 normal, no murmur, click, rub or gallop Extremities: extremities normal, atraumatic, no cyanosis or edema  EKG sinus rhythm at 74 with nonspecific ST and T-wave changes. I personally reviewed  this EKG  ASSESSMENT AND PLAN:   Palpitations History of palpitations somewhat improved with low-dose beta blocker. She has had 3-4 episodes of tachycardia palpitations over the last year. These usually resolve with taking a when necessary metoprolol.  Essential hypertension History of hypertension blood pressure measured at 124/80. She is on metoprolol and valsartan. Continue current meds  at current dosing      Runell Gess MD [facility], [facility] 01/26/2015 11:27 AM

## 2015-01-26 NOTE — Assessment & Plan Note (Signed)
History of hypertension blood pressure measured at 124/80. She is on metoprolol and valsartan. Continue current meds at current dosing

## 2015-01-26 NOTE — Assessment & Plan Note (Signed)
History of palpitations somewhat improved with low-dose beta blocker. She has had 3-4 episodes of tachycardia palpitations over the last year. These usually resolve with taking a when necessary metoprolol.

## 2015-01-27 LAB — LIPID PANEL
Cholesterol: 208 mg/dL — ABNORMAL HIGH (ref 125–200)
HDL: 47 mg/dL (ref >=46)
LDL Cholesterol: 139 mg/dL — ABNORMAL HIGH (ref <130)
Total CHOL/HDL Ratio: 4.4 Ratio (ref <=5.0)
Triglycerides: 108 mg/dL (ref <150)
VLDL: 22 mg/dL (ref <30)

## 2015-01-27 LAB — HEPATIC FUNCTION PANEL
ALT: 12 U/L (ref 6–29)
AST: 19 U/L (ref 10–35)
Albumin: 3.8 g/dL (ref 3.6–5.1)
Alkaline Phosphatase: 154 U/L — ABNORMAL HIGH (ref 33–130)
Bilirubin, Direct: 0.1 mg/dL (ref <=0.2)
Indirect Bilirubin: 0.4 mg/dL (ref 0.2–1.2)
Total Bilirubin: 0.5 mg/dL (ref 0.2–1.2)
Total Protein: 6.4 g/dL (ref 6.1–8.1)

## 2015-01-29 ENCOUNTER — Telehealth: Payer: Self-pay | Admitting: Cardiovascular Disease

## 2015-01-29 DIAGNOSIS — Z79899 Other long term (current) drug therapy: Secondary | ICD-10-CM

## 2015-01-29 MED ORDER — METOPROLOL TARTRATE 25 MG PO TABS: 12.5000 mg | ORAL_TABLET | Freq: Two times a day (BID) | ORAL | Status: DC

## 2015-01-29 MED ORDER — ATORVASTATIN CALCIUM 20 MG PO TABS: 20.0000 mg | ORAL_TABLET | Freq: Every day | ORAL | Status: DC

## 2015-01-29 MED ORDER — VALSARTAN 160 MG PO TABS: 160.0000 mg | ORAL_TABLET | Freq: Every day | ORAL | Status: DC

## 2015-01-29 NOTE — Addendum Note (Signed)
Addended by: Ladell Heads on: 01/29/2015 05:14 PM   Modules accepted: Orders

## 2015-01-29 NOTE — Telephone Encounter (Signed)
°*  STAT* If patient is at the pharmacy, call can be transferred to refill team.   1. Which medications need to be refilled? (please list name of each medication and dose if known) Diovan and Metoprolol  2. Which pharmacy/location (including street and city if local pharmacy) is medication to be sent to? [facility]   3. Do they need a 30 day or 90 day supply? 30  * She needs new prescriptions*

## 2015-01-29 NOTE — Telephone Encounter (Signed)
Pt advised of refills being sent, also results given for lipid tests - atorva started per Dr. Hazle Coca instructions, order sent, pt aware of how to take and advised to call if new concerns. Recheck for labs ordered, pt aware to present in March for this as well.

## 2015-03-02 ENCOUNTER — Other Ambulatory Visit: Payer: Self-pay | Admitting: Cardiovascular Disease

## 2015-03-02 NOTE — Telephone Encounter (Signed)
Rx request sent to pharmacy.  

## 2015-03-22 DIAGNOSIS — E785 Hyperlipidemia, unspecified: Secondary | ICD-10-CM | POA: Diagnosis not present

## 2015-03-22 DIAGNOSIS — M80051A Age-related osteoporosis with current pathological fracture, right femur, initial encounter for fracture: Secondary | ICD-10-CM | POA: Diagnosis not present

## 2015-03-22 DIAGNOSIS — M19041 Primary osteoarthritis, right hand: Secondary | ICD-10-CM | POA: Diagnosis not present

## 2015-03-22 DIAGNOSIS — S8991XA Unspecified injury of right lower leg, initial encounter: Secondary | ICD-10-CM | POA: Diagnosis not present

## 2015-03-22 DIAGNOSIS — S99921A Unspecified injury of right foot, initial encounter: Secondary | ICD-10-CM | POA: Diagnosis not present

## 2015-03-22 DIAGNOSIS — M8580 Other specified disorders of bone density and structure, unspecified site: Secondary | ICD-10-CM | POA: Diagnosis not present

## 2015-03-22 DIAGNOSIS — M79673 Pain in unspecified foot: Secondary | ICD-10-CM | POA: Diagnosis not present

## 2015-03-22 DIAGNOSIS — M25551 Pain in right hip: Secondary | ICD-10-CM | POA: Diagnosis not present

## 2015-03-22 DIAGNOSIS — Z791 Long term (current) use of non-steroidal anti-inflammatories (NSAID): Secondary | ICD-10-CM | POA: Diagnosis not present

## 2015-03-22 DIAGNOSIS — Z602 Problems related to living alone: Secondary | ICD-10-CM | POA: Diagnosis not present

## 2015-03-22 DIAGNOSIS — Z7982 Long term (current) use of aspirin: Secondary | ICD-10-CM | POA: Diagnosis not present

## 2015-03-22 DIAGNOSIS — S93601A Unspecified sprain of right foot, initial encounter: Secondary | ICD-10-CM | POA: Diagnosis not present

## 2015-03-22 DIAGNOSIS — M19042 Primary osteoarthritis, left hand: Secondary | ICD-10-CM | POA: Diagnosis not present

## 2015-03-22 DIAGNOSIS — M1711 Unilateral primary osteoarthritis, right knee: Secondary | ICD-10-CM | POA: Diagnosis not present

## 2015-03-22 DIAGNOSIS — Z882 Allergy status to sulfonamides status: Secondary | ICD-10-CM | POA: Diagnosis not present

## 2015-03-22 DIAGNOSIS — I10 Essential (primary) hypertension: Secondary | ICD-10-CM | POA: Diagnosis not present

## 2015-03-22 DIAGNOSIS — Z91013 Allergy to seafood: Secondary | ICD-10-CM | POA: Diagnosis not present

## 2015-03-22 DIAGNOSIS — S32591A Other specified fracture of right pubis, initial encounter for closed fracture: Secondary | ICD-10-CM | POA: Diagnosis not present

## 2015-03-22 DIAGNOSIS — S329XXA Fracture of unspecified parts of lumbosacral spine and pelvis, initial encounter for closed fracture: Secondary | ICD-10-CM | POA: Diagnosis not present

## 2015-03-22 DIAGNOSIS — R9082 White matter disease, unspecified: Secondary | ICD-10-CM | POA: Diagnosis not present

## 2015-03-22 DIAGNOSIS — M25471 Effusion, right ankle: Secondary | ICD-10-CM | POA: Diagnosis not present

## 2015-03-22 DIAGNOSIS — E876 Hypokalemia: Secondary | ICD-10-CM | POA: Diagnosis not present

## 2015-03-22 DIAGNOSIS — S60222A Contusion of left hand, initial encounter: Secondary | ICD-10-CM | POA: Diagnosis not present

## 2015-03-22 DIAGNOSIS — G319 Degenerative disease of nervous system, unspecified: Secondary | ICD-10-CM | POA: Diagnosis not present

## 2015-03-22 DIAGNOSIS — S6991XA Unspecified injury of right wrist, hand and finger(s), initial encounter: Secondary | ICD-10-CM | POA: Diagnosis not present

## 2015-03-22 DIAGNOSIS — T148 Other injury of unspecified body region: Secondary | ICD-10-CM | POA: Diagnosis not present

## 2015-03-22 DIAGNOSIS — I7090 Unspecified atherosclerosis: Secondary | ICD-10-CM | POA: Diagnosis not present

## 2015-03-22 DIAGNOSIS — H3530 Unspecified macular degeneration: Secondary | ICD-10-CM | POA: Diagnosis not present

## 2015-03-22 DIAGNOSIS — Z8249 Family history of ischemic heart disease and other diseases of the circulatory system: Secondary | ICD-10-CM | POA: Diagnosis not present

## 2015-03-22 DIAGNOSIS — S0990XA Unspecified injury of head, initial encounter: Secondary | ICD-10-CM | POA: Diagnosis not present

## 2015-03-22 NOTE — Nursing Note (Signed)
Adult Patient History Form-Text       Adult Patient History Entered On:  03/22/2015 15:29 EDT    Performed On:  03/22/2015 15:20 EDT by Rubye Oaks, RN, SUSAN               General Info   Preferred Name :   Kendra Dominguez   Admitted From :   ED   Mode of Arrival on Unit :   Stretcher   Accompanied By :   Family member   In Charge of News (ICON) Name :   Dixie Dials   [phone]   Information Given By :   Self   Primary Language :   English   Pregnancy Status :   N/A   Has the patient received chemotherapy or biotherapy within the last 48 hours? :   No   Is the patient currently (2-3 days) receiving radiation treatment? :   No   DICKERSON, RN, SUSAN - 03/22/2015 15:20 EDT   Problem History   (As Of: 03/22/2015 15:29:59 EDT)   Problems(Active)    Arthritis (SNOMED CT  :[id number] )  Name of Problem:   Arthritis ; Recorder:   GREGOR, RN, NANCY A; Confirmation:   Confirmed ; Classification:   Medical ; Code:   [id number] ; Contributor System:   PowerChart ; Last Updated:   03/22/2015 7:07 EDT ; Life Cycle Date:   03/22/2015 ; Life Cycle Status:   Active ; Vocabulary:   SNOMED CT        Hypertension (SNOMED CT  :[id number] )  Name of Problem:   Hypertension ; Recorder:   GREGOR, RN, NANCY A; Confirmation:   Confirmed ; Classification:   Medical ; Code:   88416606 ; Contributor System:   PowerChart ; Last Updated:   03/22/2015 6:59 EDT ; Life Cycle Date:   03/22/2015 ; Life Cycle Status:   Active ; Vocabulary:   SNOMED CT        Macular degeneration (SNOMED CT  :AZEGxwEmLzUcjdjvwKgAAg )  Name of Problem:   Macular degeneration ; Recorder:   GREGOR, RN, NANCY A; Confirmation:   Confirmed ; Classification:   Medical ; Code:   AZEGxwEmLzUcjdjvwKgAAg ; Contributor System:   Dietitian ; Last Updated:   03/22/2015 6:59 EDT ; Life Cycle Date:   03/22/2015 ; Life Cycle Status:   Active ; Vocabulary:   SNOMED CT        PVC (premature ventricular contraction) (SNOMED CT  :3016010932 )   Name of Problem:   PVC (premature ventricular contraction) ; Recorder:   Rubye Oaks, RN, SUSAN; Confirmation:   Confirmed ; Classification:   Patient Stated ; Code:   [id number] ; Contributor System:   Dietitian ; Last Updated:   03/22/2015 15:26 EDT ; Life Cycle Date:   03/22/2015 ; Life Cycle Status:   Active ; Vocabulary:   SNOMED CT          Diagnoses(Active)    Closed pelvic fracture  Date:   03/22/2015 ; Diagnosis Type:   Discharge ; Confirmation:   Confirmed ; Clinical Dx:   Closed pelvic fracture ; Classification:   Medical ; Clinical Service:   Non-Specified ; Code:   ICD-10-CM ; Probability:   0 ; Diagnosis Code:   S32.9XXA      Fall  Date:   03/22/2015 ; Diagnosis Type:   Reason For Visit ; Confirmation:   Complaint of ; Clinical Dx:   Fall ; Classification:   Medical ; Clinical  Service:   Emergency medicine ; Code:   PNED ; Probability:   0 ; Diagnosis Code:   [id number]      Fall  Date:   03/22/2015 ; Diagnosis Type:   Discharge ; Confirmation:   Confirmed ; Clinical Dx:   Fall ; Classification:   Medical ; Clinical Service:   Non-Specified ; Code:   ICD-10-CM ; Probability:   0 ; Diagnosis Code:   M84W19.XXXA      Foot injury - Minor  Date:   03/22/2015 ; Diagnosis Type:   Reason For Visit ; Confirmation:   Complaint of ; Clinical Dx:   Foot injury - Minor ; Classification:   Medical ; Clinical Service:   Emergency medicine ; Code:   PNED ; Probability:   0 ; Diagnosis Code:   [id number]      Hand contusion  Date:   03/22/2015 ; Diagnosis Type:   Discharge ; Confirmation:   Confirmed ; Clinical Dx:   Hand contusion ; Classification:   Medical ; Clinical Service:   Non-Specified ; Code:   ICD-10-CM ; Probability:   0 ; Diagnosis Code:   E33.295JS60.229A      Hip injury - Minor  Date:   03/22/2015 ; Diagnosis Type:   Reason For Visit ; Confirmation:   Complaint of ; Clinical Dx:   Hip injury - Minor ; Classification:   Medical ; Clinical Service:   Emergency medicine ;  Code:   PNED ; Probability:   0 ; Diagnosis Code:   [id number]      Inability to walk  Date:   03/22/2015 ; Diagnosis Type:   Discharge ; Confirmation:   Confirmed ; Clinical Dx:   Inability to walk ; Classification:   Medical ; Clinical Service:   Non-Specified ; Code:   ICD-10-CM ; Probability:   0 ; Diagnosis Code:   R26.2      Pain in right knee  Date:   03/22/2015 ; Diagnosis Type:   Discharge ; Confirmation:   Confirmed ; Clinical Dx:   Pain in right knee ; Classification:   Medical ; Clinical Service:   Non-Specified ; Code:   ICD-10-CM ; Probability:   0 ; Diagnosis Code:   M25.561      Right foot sprain  Date:   03/22/2015 ; Diagnosis Type:   Discharge ; Confirmation:   Confirmed ; Clinical Dx:   Right foot sprain ; Classification:   Medical ; Clinical Service:   Non-Specified ; Code:   ICD-10-CM ; Probability:   0 ; Diagnosis Code:   S93.601A        Allergy   (As Of: 03/22/2015 15:29:59 EDT)   Allergies (Active)   shellfish  Estimated Onset Date:   Unspecified ; Reactions:   ANAPHYLACTIC ; Created By:   Karolee OhsGREGOR, RN, NANCY A; Reaction Status:   Active ; Category:   Drug ; Substance:   shellfish ; Type:   Allergy ; Severity:   Severe ; Updated By:   Karolee OhsGREGOR, RN, Erma HeritageNANCY A; Reviewed Date:   03/22/2015 7:02 EDT      sulfa drugs  Estimated Onset Date:   Unspecified ; Reactions:   HIVES ; Created By:   Karolee OhsGREGOR, RN, NANCY A; Reaction Status:   Active ; Category:   Drug ; Substance:   sulfa drugs ; Type:   Allergy ; Severity:   Moderate ; Updated By:   Karolee OhsGREGOR, RN, Erma HeritageNANCY A; Reviewed Date:   03/22/2015 7:02 EDT  Immunizations   Last Tetanus :   Unknown   Influenza Vaccine Status :   Received prior to admission, during current flu season, Not received   Influenza Vaccine Refused :   No   DICKERSON, RN, SUSAN - 03/22/2015 15:20 EDT   ID Risk Screen   Patient Recent Travel History :   No recent travel   Family Member Travel History :   No recent travel   MRSA/VRE Screening :   No   DICKERSON, RN,  SUSAN - 03/22/2015 15:20 EDT   Chills :   No   Cough (Any Duration) :   No   Fever :   No   Hemoptysis (Blood in Sputum) :   No   Night Sweats :   No   Weight Loss Greater Than 10 Pounds :   No   Hx of TB Now or at Any Time In the Past (Even if on Meds) :   No   DICKERSON, RN, SUSAN - 03/22/2015 15:20 EDT   Does the Patient Have any of the Following Conditions That Compromise the Immune System :   None   DICKERSON, RN, SUSAN - 03/22/2015 15:20 EDT   C. diff Screen   Have you had 3 or more loose/watery stool in 24 hours? :   No   DICKERSON, RN, SUSAN - 03/22/2015 15:20 EDT   Procedure History        -    Procedure History   (As Of: 03/22/2015 15:29:59 EDT)     Anesthesia Minutes:   0 ; Procedure Name:   Appendectomy ; Procedure Minutes:   0            Anesthesia Minutes:   0 ; Procedure Name:   Cholecystectomy ; Procedure Minutes:   0            Anesthesia/Sedation   Anesthesia History :   Prior general anesthesia   Anesthesia Reaction :   None   Moderate Sedation History :   Prior sedation for procedure   Previous Problem With Sedation :   None   DICKERSON, RN, SUSAN - 03/22/2015 15:20 EDT   Transfusion/Bloodless Med   Transfusion History :   No prior transfusion   Will Patient Accept Blood Transfusion and/or Blood Products :   Yes   DICKERSON, RN, SUSAN - 03/22/2015 15:20 EDT   Nutrition   Home Diet :   Regular   Appetite :   Good   Feeding Ability :   Complete independence   DICKERSON, RN, SUSAN - 03/22/2015 15:20 EDT   Functional   Cognitive Function Concerns Prior to Admission :   None reported   Ability to Ambulate Prior to Admission :   Independent   Ability to Move in Bed Prior to Admission :   Independent   ADLs :   Complete assist   DICKERSON, RN, SUSAN - 03/22/2015 15:20 EDT   Functional Assessment   Bathing :   Dependent (0)   Dressing :   Dependent (0)   Toileting :   Dependent (0)   Transferring Bed or Chair :   Requires assistance (1)   Continence :   Requires assistance (1)   Feeding :   Independent (2)   ADL  Index Score :   4    DICKERSON, RN, SUSAN - 03/22/2015 15:20 EDT   Living and Resources   Living Situation :   Home independently   DICKERSON, RN, SUSAN - 03/22/2015 15:20  EDT   Social History   Social History   (As Of: 03/22/2015 15:29:59 EDT)   Tobacco:        Never smoker, Cigarettes   (Last Updated: 03/22/2015 07:14:38 EDT by Neil Crouch, RN, NICOLA)          Alcohol:        Denies   (Last Updated: 03/22/2015 15:28:10 EDT by Rubye Oaks, RN, SUSAN)          Substance Abuse:        Denies   (Last Updated: 03/22/2015 15:28:18 EDT by Rubye Oaks, RN, SUSAN)            Sexual Assault/Domestic Violence Screen   Have You Ever Been Emotionally or Physically Abused by Your Partner :   No   Have You Been Physically Hurt by Someone Within the Past Year :   No   Within the Last Year, Has Anyone Forced You to Have Sexual Activity :   No   DICKERSON, RN, SUSAN - 03/22/2015 15:20 EDT   Spiritual   Do You Receive Comfort From Spiritual Practices :   No   Religious Preference :   [employer]   Spiritual Advisor/Minister to be Notified :   No   DICKERSON, RN, SUSAN - 03/22/2015 15:20 EDT   Advance Directive   *Advance Directive :   No   Patient Wishes to Receive Further Information on Advance Directives :   No   DICKERSON, RN, SUSAN - 03/22/2015 15:20 EDT   Educ Needs   Patient/Family Learning Style Preferences   Patient :   None   Family :   None   DICKERSON, RN, SUSAN - 03/22/2015 15:20 EDT   Barriers to Learning :   None evident   Preventative Measures Information Given :   Hand Hygiene, Respiratory Hygiene, Environmental Safety, Rapid Response Team (RRT) program, Pain Scale, Unit/Room Orientation   DICKERSON, RN, SUSAN - 03/22/2015 15:20 EDT   DC Needs   Discharge To :   Home with family support, Skilled Nursing Facility - Short Term   Home Caregiver Name/Relationship :     Hermelinda Medicus- Sister   Home Caregiver Phone Number :     # [phone]   Needs Assistance with Transportation :   No   Needs Assistance At Home Upon Discharge :   No   Rubye Oaks,  RN, SUSAN - 03/22/2015 15:20 EDT

## 2015-03-22 NOTE — Nursing Note (Signed)
Basic Admission Information - Text       Basic Admission Information Adult Entered On:  03/22/2015 15:20 EDT    Performed On:  03/22/2015 15:18 EDT by Rubye OaksICKERSON, RN, SUSAN               Height/Weight   Height/Length Measured :   154 cm(Converted to: 5 ft 1 in, 5.05 ft, 60.63 in)    Ideal Body Weight Calculated :   46.949 kg   DICKERSON, RN, SUSAN - 03/22/2015 15:18 EDT   Safety   Environmental Safety in Place :   Adequate room lighting, Bed exit alarm, Bed in low position, Call device within reach, Personal items within reach   Demonstrates Ability to Use Call Light :   Yes   Patient Identified :   Identification band   Room Orientation/Facility Policy Reviewed :   Yes   Room Orientation/Facility Policy Reviewed With :   Patient   Rubye OaksDICKERSON, RDarl Pikes, SUSAN - 03/22/2015 15:18 EDT   Valuables/Belongings   Patient Search Completed :   Not done   Rubye OaksICKERSON, RN, SUSAN - 03/22/2015 15:18 EDT   Rubye OaksICKERSON, RN, SUSAN - 03/22/2015 15:18 EDT   Clothing Valuables Belongings Grid     Electronic Devices, Patient Valuables  Personal Devices, Patient Valuables        At Bedside :    1   (Comment: CELL PHONE [place][DICKERSON, RN, SUSAN - 03/22/2015 15:30 EDT] )  1   (Comment: glasses Rubye Oaks[DICKERSON, RN, SUSAN - 03/22/2015 15:18 EDT] )            Rubye OaksICKERSON, RN, SUSAN - 03/22/2015 15:30 EDT  Rubye OaksICKERSON, RN, SUSAN - 03/22/2015 15:18 EDT        Total Number :   1    DICKERSON, RN, SUSAN - 03/22/2015 15:18 EDT

## 2015-03-22 NOTE — Nursing Note (Signed)
Basic Admission Information - Text       Basic Admission Information Adult Entered On:  03/22/2015 17:03 EDT    Performed On:  03/22/2015 17:02 EDT by Janey GreaserFOREMAN, RN, REBECCA H               Vital Signs   Temperature Oral :   36.8 degC   Peripheral Pulse Rate :   102 bpm (HI)    Respiratory Rate :   18 br/min   Systolic/ Diastolic BP :   139 mmHg   Diastolic Blood Pressure :   77 mmHg   SpO2 :   94 %   O2 Therapy :   Room air   FOREMAN, RN, REBECCA H - 03/22/2015 17:02 EDT   Height/Weight   Weight Measured :   40.9 kg(Converted to: 90 lb 3 oz, 90.169 lb)    Height/Length Measured :   154 cm(Converted to: 5 ft 1 in, 5.05 ft, 60.63 in)    BSA Measured :   1.34 m2   Body Mass Index Measured :   17.25 kg/m2   Ideal Body Weight Calculated :   46.949 kg   FOREMAN, RN, REBECCA H - 03/22/2015 17:02 EDT   Safety   Environmental Safety in Place :   Adequate room lighting, Attend patient with toileting, Bed exit alarm, Bed in low position   Demonstrates Ability to Use Call Light :   Yes   Patient Identified :   Identification band   Room Orientation/Facility Policy Reviewed :   Yes   Room Orientation/Facility Policy Reviewed With :   Patient, Family   Janey GreaserFOREMAN, RN, REBECCA H - 03/22/2015 17:02 EDT

## 2015-03-22 NOTE — Case Communication (Signed)
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  EQUIPMENT/TREATMENT NEEDS:No discharge data available.     Previously Documented Benefits Information :   No discharge data available.     Discharge To :   Home with family support, Skilled Nursing Facility - Short Term   Home Caregiver Name/Relationship :     Hermelinda MedicusHazel Shull- Sister   Home Caregiver Phone Number :     # [phone]   CM Initial Note :   03/22/2015  I met with patient at bedside. Pt is a [age] year old lady visiting [place] with her sister. She lives in [place] and states that she is independant with her ADL's, walks on her own and drives. She fell down a flight of steps and has been unable to walk. She has a closed Pelvic fracture amd Dr Judith BlonderHawk is admitting her for this. Patient is awaiting bed assignment and I will SBAR the appropriate CM.  Gwenith SpitzIone Sack LMSW     Gwenith SpitzSACK,  IONE - 03/22/2015 12:08 EDT   Discharge Needs II   Needs Assistance with Transportation :   No   Needs Assistance at Home Upon Discharge :   No   Discharge Planning Time Spent :   20 minutes   SACK,  IONE - 03/22/2015 12:08 EDT   Benefits   Insurance Information :   Medicare   Gwenith SpitzSACK,  IONE - 03/22/2015 12:08 EDT   Advance Directive   *Advance  Directive :   No   Medical Decision Maker :   Son Onalee HuaDavid has HPOA ( Lives in [place])   Patient Wishes to Receive Further Information on Advance Directives :   No   SACK,  IONE - 03/22/2015 12:08 EDT

## 2015-03-22 NOTE — H&P (Signed)
History and Physical                 [facility]. Judith Blonder, MD  Admission Date: 03/22/2015    CHIEF COMPLAINT:  Fall.    HISTORY OF PRESENT ILLNESS:  Kendra Dominguez is a pleasant 75 year old  woman who is generally healthy and active.  She is visiting [place]  from [place], [place].  She fell from a height of 2 steps in  her vacation rental last night.  This happened in the evening, and her  sister helped her to the sofa.  In the middle of the night, she was  unable to get to the bathroom on her own, essentially her sister  dragged her there.  She subsequently was unable to stand and so EMS  was activated.  After extensive series of x-rays, she was found to  have a pelvis fracture, and was referred to the hospitalist service  for admission.  The patient continues to complain of pain mostly in  the area of the right groin and right buttocks region.  She is fairly  comfortable at rest, but with weightbearing reports extreme pain.  She  has not urinated or had a bowel movement since 2:00 a.m., but has not  been given any food yet.  She denies dizziness or syncope during this  fall, her knee \\E\\"gave out\\E\\" while she walked down stairs.    PAST MEDICAL HISTORY:  1.  Hypertension.  2.  "Irregular" heart rate, possibly PVCs.  3.  Reportedly normal previous stress test.  4.  Osteoarthritis.    5.  Macular degeneration.    PAST SURGICAL HISTORY:  1.  Cholecystectomy.  2.  Appendectomy.  3.  Esophageal dilation.    SOCIAL HISTORY:  She lives alone in [place].  She  was visiting her sister in [place] and they came to  [place] together for a trip.    FAMILY HISTORY:  She and her sister are the only 2 living siblings out  of an initial group of 7 with coronary artery disease being prominent  in the family.  Denies other familial medical disease.    ALLERGIES:  SULFA AND SHELLFISH.    HOME MEDICATIONS:  1.  Metoprolol tartrate 12.5 mg p.o. b.i.d.  2.  Atorvastatin 20 mg  daily.  3.  Aspirin 81 mg daily.  4.  Multivitamin b.i.d.  5.  Valsartan 160 mg daily.    REVIEW OF SYSTEMS:  Complete review of systems was performed.   Positives noted above.  Generally she is active working in the garden  and driving.  Denies significant visual problems currently or  cognitive problems.  No dizziness or lightheadedness or syncope.  No  GI complaints such as nausea, vomiting or diarrhea.  No rashes or skin  lesions.  No fevers or chills.  No lower extremity edema.  She has  chronic arthritis especially affecting her hands bilaterally, also her  right knee.  Review of systems otherwise negative.    PHYSICAL EXAMINATION:  VITAL SIGNS:  Temperature 37.1, heart rate  initial 107, current 86, respirations 16, blood pressure 170/98  initial, current 136/75, O2 sat 97% on room air.  GENERAL:   Well-developed, well-nourished woman in no distress, at rest.  HEAD:   Atraumatic, normocephalic.  EYES:  PERRLA, EOMI, anicteric.  ENT:   Good dentition with no oral lesions.  NECK:  Supple, with good range  of motion and no masses or thyromegaly.  CARDIOVASCULAR:  Regular rate  and rhythm.  No audible murmur.  LUNGS:  Clear bilaterally without  rhonchi or wheezing.  ABDOMEN:  Scaphoid, soft and nontender.   EXTREMITIES:  She has got some swelling and edema of the right foot  and ankle, with good range of motion in that ankle.  MUSCULOSKELETAL:   Good range of motion in the bilateral knees and hips, but right  buttock and groin pain with weightbearing on the right leg.  There is  some bruising over the right hip.  NEUROLOGIC:  She is awake, alert  and oriented.    DIAGNOSTIC STUDIES:  1.  CT scan of the hip:  Nondisplaced fracture of the right superior  pubic ramus and mildly displaced fracture of the right inferior pubic  ramus.  There is no evidence of fracture of the hip.  2.  CT scan of the head shows no acute abnormality.  Mild atrophy and  white matter disease.  3.  X-ray of the right knee shows no acute  fracture, extensive  osteoarthritis is present.  4.  X-ray of the left hand also shows no acute fracture but extensive  osteoarthritis.  5.  X-ray of the foot is negative for fracture.  6.  A 12-lead EKG:  Sinus rhythm.  Q-wave in V1 and V2, age unknown.    IMPRESSION:  661.  A 75 year old woman with acute pelvis fracture after a fall,  mildly displaced on inferior pubic ramus.  2.  Acute pelvic fractures:  She will need to be admitted to the  hospital for pain management and to initiate rehabilitation efforts.   Clearly, she is unable to stand or bear significant weight in her  current state.  We will schedule Tylenol and then have additional  analgesics as needed.  Physical therapy and rehabilitation  consultation.  I will request an orthopedic consultation to evaluate  the pelvis, rule out any need for surgical management, and also to  evaluate her right foot and ankle which is quite swollen but is  negative for fracture.  This is a fragility fracture and she will need  to start vitamin D and calcium supplements.  We will check vitamin D  levels.  3.  Hypertension:  Continue her home medications.    NEXT OF KIN:  Her sister Bubba CampHazell Shull, phone number [phone].   FULL code.      Orson GearJames S Norely Schlick, MD  TR: *n DD: 03/22/2015 15:42 TD: 03/22/2015 16:16 Job#: [id number]  \\[id number]\\DOC#: [id number]  \\[id number]\\\\[id number]\\    Signature Line     Electronically Signed on 03/30/2015 06:56 PM EDT   ________________________________________________   Colleen CanHAWK-MD,  Marizol Borror S               Modified by: Colleen CanHAWK-MD,  Ennio Houp S on 03/30/2015 06:56 PM EDT

## 2015-03-23 ENCOUNTER — Telehealth: Payer: Self-pay | Admitting: Family Medicine

## 2015-03-23 DIAGNOSIS — S32591A Other specified fracture of right pubis, initial encounter for closed fracture: Secondary | ICD-10-CM | POA: Diagnosis not present

## 2015-03-23 DIAGNOSIS — M25571 Pain in right ankle and joints of right foot: Secondary | ICD-10-CM | POA: Diagnosis not present

## 2015-03-23 DIAGNOSIS — E785 Hyperlipidemia, unspecified: Secondary | ICD-10-CM | POA: Diagnosis not present

## 2015-03-23 DIAGNOSIS — M25551 Pain in right hip: Secondary | ICD-10-CM | POA: Diagnosis not present

## 2015-03-23 DIAGNOSIS — I10 Essential (primary) hypertension: Secondary | ICD-10-CM | POA: Diagnosis not present

## 2015-03-23 DIAGNOSIS — S329XXA Fracture of unspecified parts of lumbosacral spine and pelvis, initial encounter for closed fracture: Secondary | ICD-10-CM | POA: Diagnosis not present

## 2015-03-23 DIAGNOSIS — E876 Hypokalemia: Secondary | ICD-10-CM | POA: Diagnosis not present

## 2015-03-23 NOTE — Telephone Encounter (Signed)
Left a message for patent to return call about the flu shot.  If they have had it where and when, if not they need to come by and receive it. 

## 2015-03-23 NOTE — Nursing Note (Signed)
Medication Administration Follow Up-Text       Medication Administration Follow Up Entered On:  03/23/2015 23:16 EDT    Performed On:  03/23/2015 8:32 EDT by Hollice EspyGIBSON, RN, LYNDSEY R      Intervention Information:     morphine  Performed by Midge MiniumLANTZ, RN, Alycia RossettiYAN T on 03/23/2015 08:17:00 EDT       morphine,2mg   IV Push,Antecubital, Left,severe pain (8-10)       Medication Effectiveness Evaluation   Medication Administration Reason :   Pain   Medication Effective :   Yes   Medication Response :   Symptoms improved, Continue to observe for symptoms   Hollice EspyGIBSON, RN, Jorge NyLYNDSEY R - 03/23/2015 23:15 EDT

## 2015-03-23 NOTE — Progress Notes (Signed)
PT Time Spent with Patient Acute/OP-Text       PT Time Spent With Patient Outpatient/Acute Entered On:  03/23/2015 10:23 EDT    Performed On:  03/23/2015 10:23 EDT by Vedia PereyraWALTERS,  WHITNEY C               Time Spent With Patient   PT Units Cancelled Missed     PT Units Lost #1          Reason :    Other: Pt with 2 pubic fx not assessed by ortho yet. Awaiting ortho consult and weight bearing status to proceed with safe evaluation. Will check back tomorrow or as soon as possible                Vedia PereyraWALTERS,  WHITNEY C - 03/23/2015 10:23 EDT

## 2015-03-23 NOTE — Case Communication (Signed)
CM Discharge Planning Assessment - Text       CM Discharge Planning Assessment Entered On:  03/23/2015 15:47 EDT    Performed On:  03/23/2015 15:35 EDT by Kelby Aline               Home Environment   Living Environment :   Living Situation: Home independently  Current Home Treatments:   Home Devices/Equipment   Professional Skilled Services:   Therapist, sports and Community Resources:   Sensory Deficits:   Performed by: Drema Balzarine A-03/23/15 09:55:00    Living Situation: Home independently  Current Home Treatments:   Home Devices/Equipment   Professional Skilled Services:   Therapist, sports and Community Resources:   Sensory Deficits:   Performed byEvelena Leyden,  IONE-03/22/15 12:08:00       Affect/Behavior :   Appropriate   Lives With :   Alone   Lives In :   Single level home   Living Situation :   Home independently   Job Responsibilities :     Patient is retired   Armed forces technical officer Information :   Has a PCP in [place] where she lives   Home Health Provider :   N/A   Barriers at Home :   None   Kelby Aline - 03/23/2015 15:35 EDT   Home Environment   ADLs :   Moderate assist   Kelby Aline - 03/23/2015 15:35 EDT   Discharge Needs I   Previously Documented Discharge Needs :   DISCHARGE PLAN/NEEDS:  Discharge To, Anticipated: Home with family support, Skilled Nursing Facility - Short Term - Rubye Oaks, RN, [place] - 03/22/15 15:20:00  Needs Assistance with Transportation: No - Rubye Oaks, RN, [place] - 03/22/15 15:20:00  EQUIPMENT/TREATMENT NEEDS:  Needs Assistance at Home Upon Discharge: No Rubye Oaks, RN, [place] - 03/22/15 15:20:00       Previously Documented Benefits Information :   Performed By: Gwenith Spitz  - 03/22/15 12:08:00       Anticipated Discharge Date :   03/25/2015 EDT   Anticipated Discharge Time Slot :   1400-1600   Discharge To :   Home with family support   Home Caregiver Name/Relationship :     Hermelinda Medicus- Sister   Home Caregiver Phone Number :     # [phone]   Kelby Aline  - 03/23/2015 15:35 EDT   CM Initial Note :   03/22/2015  I met with patient at bedside. Pt is a 75 year old lady visiting [place] with her sister. She lives in [place] and states that she is independant with her ADL's, walks on her own and drives. She fell down a flight of steps and has been unable to walk. She has a closed Pelvic fracture amd Dr Judith Blonder is admitting her for this. Patient is awaiting bed assignment and I will SBAR the appropriate CM.  Gwenith Spitz LMSW      03/23/2015  - TS - Met with patient and her sister this AM - to discuss DC plans. Currently, patient is waiting for orto eval so that PT can eval her. CM provided her with a list of Acute and Sub Acute rehab list. Patient states she is hopeful she will get well enough to go home to [place] and have OP therapy with her kids assistance. CM will follow up again after eval and PT eval. Patient will need at the least possibly a walker at time of DC  if she decides to go home. CM is following, Referral was [facility] to [facility] for review.      Kelby Aline - 03/23/2015 15:47 EDT     Discharge Needs II   Home Equipment Rehab :   Therapist, music Skilled Services :   Physical Therapy   Needs Assistance with Transportation :   Yes   Needs Assistance at Home Upon Discharge :   Yes   Discharge Planning Time Spent :   30 minutes   Kelby Aline - 03/23/2015 15:35 EDT   Benefits   Insurance Information :   Medicare   Kelby Aline - 03/23/2015 15:35 EDT   Advance Directive   *Advance Directive :   No   Patient Wishes to Receive Further Information on Advance Directives :   No   Kelby Aline - 03/23/2015 15:35 EDT

## 2015-03-23 NOTE — Progress Notes (Signed)
Chaplaincy Note - Text       Chaplaincy Note Entered On:  03/23/2015 14:47 EDT    Performed On:  03/23/2015 13:55 EDT by ARP,  ROBERT K               Chaplaincy Consult   Faith/Denomination :   [employer]   Importance of Faith :   Moderately important   Religious Spiritual Resources :   Hope, Spiritual well being   Religious Spiritual Practices :   Prayer   Religious Spiritual Concerns :   Loss of control   Religious Family Issues :   Pt had a close friend with her for support.   Additional Information, Chaplaincy :   Pt was pleasant and alert. Engaged chaplain. Pt was familar with hospital chaplaincy. Pt was consoled- chaplain provided empathic listening and informed pt and her care giver of pastoral care services.   ARP,  ROBERT K - 03/23/2015 14:43 EDT

## 2015-03-23 NOTE — Nursing Note (Signed)
Medication Administration Follow Up-Text       Medication Administration Follow Up Entered On:  03/23/2015 6:45 EDT    Performed On:  03/22/2015 22:00 EDT by Hollice EspyGIBSON, RN, LYNDSEY R      Intervention Information:     ibuprofen  Performed by Hollice EspyGIBSON, RN, LYNDSEY R on 03/22/2015 21:00:00 EDT       ibuprofen,400mg   Oral,mild pain (1-3)       Medication Effectiveness Evaluation   Medication Administration Reason :   Pain   Medication Effective :   Yes   Medication Response :   Symptoms improved   Hollice EspyGIBSON, RN, LYNDSEY R - 03/23/2015 6:44 EDT

## 2015-03-23 NOTE — Progress Notes (Signed)
 Inpatient OT Evaluation - Text       Inpatient OT Evaluation Entered On:  03/23/2015 13:32 EDT    Performed On:  03/23/2015 9:55 EDT by NICHOLAUS ROSELLA A               Reason for Treatment   Subjective Statement :   pt reporting I PTA, she is from [place], [place] and was vacationing with her sister. My leg just gave out     pt lives alone in [place], but reporting supportive family, has cane she uses for longer distances      *Reason for Referral :   s/o fall w/ pelvic fx, R ankle sprain, R knee pain    pt with activity orders QID to recliner or BSC, no WBing, so had pt complete toe touch WBing with rolling walker. Will need further clarification for WBing, per RN plan is no surgery for pelvis.       hx of arthritis (B hands & knees)      DAVIS,  CHERYL A - 03/23/2015 13:12 EDT   General Information   Occupational Therapy Orders :   OT Evaluation and Treatment Inpatient Acute - 03/22/15 16:00:00 EDT, Stop date 03/22/15 16:00:00 EDT     Precautions RTF :   No qualifying data available.     Pain Present :   Yes actual or suspected pain   DAVIS,  CHERYL A - 03/23/2015 13:12 EDT   Pain Assessment   Pain Location :   Other: with bed mobility, pt unable to tolerate rolling to R side, pt reporting no pain while sitting EBO or in recliner.    Self Report Pain :   Numeric rating scale   DAVIS,  CHERYL A - 03/23/2015 13:12 EDT   Home Environment   Living Environment :   Home Environment  Living Situation:  Home independently  Performed By:  AISHA, RN, SUSAN 03/22/2015  Lives In:  Single level home  Performed By:  JACKLINE OLDS 03/22/2015  Lives With:  Alone  Performed By:  SACK,  IONE 03/22/2015     Living Situation :   Home independently   Lives With :   Alone   Lives In :   Single level home   DAVIS,  CHERYL A - 03/23/2015 13:12 EDT   OT Basic ADL   Basic ADL Grid   Grooming :   Supervision or setup   (Comment: EOB  [DAVIS,  CHERYL A - 03/23/2015 13:12 EDT] )   Bathing :   Minimal contact assistance   UE Dressing :   Supervision or  setup   (Comment: EOB  [DAVIS,  CHERYL A - 03/23/2015 13:12 EDT] )   LE Dressing :   Minimal contact assistance   Toileting :   Minimal contact assistance   Transfer Toilet :   Minimal contact assistance   DAVIS,  CHERYL A - 03/23/2015 13:12 EDT   ADL Comments :   pt supine>sit w/ Min A and increased time, EOB sitting able to tolerate UB & grooming ADLs with items in reach at bedside table, pt educated in use of rolling walker and completed toe touch WBing for walking around bed to recliner chair. Min A needed to don slippers, anticipate Min A for pulling up in LB dressing 2' need to maintain contact with RW. Pt in recliner at end of session w/ call light, TABs on, RN notified      Limiting Factors :   Pain  DAVIS,  CHERYL A - 03/23/2015 13:12 EDT   Cognition   Cognition :   Within functional limits   DAVIS,  CHERYL A - 03/23/2015 13:12 EDT   UE Strength/ROM   Upper Extremity Overall ROM Grid   Left Upper Extremity Passive Range :   Within functional limits   Left Upper Extremity Active Range :   Within functional limits   Right Upper Extremity Passive Range :   Within functional limits   Right Upper Extremity Active Range :   Within functional limits   DAVIS,  CHERYL A - 03/23/2015 13:12 EDT   Left Upper Extremity Strength Grid   Scapular Elevation :   4+   Shoulder Flexion :   4+   Shoulder Extension :   4+   Shoulder Abduction :   4+   Shoulder Adduction :   4+   Shoulder External Rotation :   4+   Shoulder Internal Rotation :   4+   Elbow Flexion :   4+   Elbow Extension :   4+   Forearm Pronation :   4+   Forearm Supination :   4+   Wrist Flexion :   4+   Wrist Extension :   4+   Finger Flexion :   4+   Finger Extension :   4+   DAVIS,  CHERYL A - 03/23/2015 13:12 EDT   Right Upper Extremity Strength Grid   Scapular Elevation :   4+   Shoulder Flexion :   4+   Shoulder Extension :   4+   Shoulder Abduction :   4+   Shoulder Adduction :   4+   Shoulder External Rotation :   4+   Shoulder Internal Rotation :   4+    Elbow Flexion :   4+   Elbow Extension :   4+   Forearm Pronation :   4+   Forearm Supination :   4+   Wrist Flexion :   4+   Wrist Extension :   4+   Finger Flexion :   4+   Finger Extension :   4+   DAVIS,  CHERYL A - 03/23/2015 13:12 EDT   UE Sensation   Left Sensation Grid   Light Touch :   Intact   DAVIS,  CHERYL A - 03/23/2015 13:12 EDT   Right Sensation Grid   Light Touch :   Intact   DAVIS,  CHERYL A - 03/23/2015 13:12 EDT   Education   Teaching Method :   Demonstration, Explanation   DAVIS,  CHERYL A - 03/23/2015 13:12 EDT   Occupational Therapy Education Grid   Activity of Daily Living Training :   Needs further teaching, Needs practice/supervision   Bed Positioning :   Needs practice/supervision, Needs further teaching   Bed to Chair Transfers :   Needs further teaching, Needs practice/supervision   Body Mechanics :   Needs further teaching, Needs practice/supervision   Exercise Program :   Needs further teaching, Needs practice/supervision   Home Safety :   Needs further teaching, Needs practice/supervision   Plan of Care :   Verbalizes understanding   DAVIS,  CHERYL A - 03/23/2015 13:12 EDT   Long Term Goals   Patient/Caregiver Goals :   Return to prior level of I.    OT LT Goals Reviewed :   Yes   DAVIS,  CHERYL A - 03/23/2015 13:12 EDT   Short Term  Goals   Other OT Goal Grid     Goal #1  Goal #2  Goal #3  Goal #4    Goal :    LB dressing, SPV with AE PRN   LB bathing, SPV with AE PRN   toileting & toilet t/f, SPV   Pt willl demo ability to complete x15 min standing activity, rest breaks as needed, SPV.      Status :    Initial   Initial   Initial   Initial       DAVIS,  CHERYL A - 03/31/2015 13:12 EDT  DAVIS,  CHERYL A - 03-31-15 13:12 EDT  DAVIS,  CHERYL A - 03/31/15 13:12 EDT  DAVIS,  CHERYL A - Mar 31, 2015 13:12 EDT      OT ST Goals Reviewed :   Yes   DAVIS,  CHERYL A - 31-Mar-2015 13:12 EDT   Plan   OT Frequency Acute :   Mon/Tu/We/Th/Fr   Duration :   2    Duration Unit :   Weeks   Planned Treatments :    Balance training, Basic Activities of Daily Living, Energy conservation training, Mobility training, Patient education, Safety education, Therapeutic activities, Therapeutic exercises, Therapeutic exercises for strengthening and ROM   Treatment Plan/Goals Established With Patient/Caregiver :   Yes   OT Evaluation Complete :   Yes   DAVIS,  CHERYL A - 03/31/2015 13:12 EDT   Time Spent With Patient   OT Evaluation Units, Low Complexity :   1 Unit   OT Individual Eval Time, Low Complexity :   15 minutes   OT ADL TRAINING 15 MIN :   2    OT ADL Training Minutes :   30 minutes   OT Total Individual Therapy Time :   45 minutes   OT Total Timed Code Treatment Units :   2 units   OT Total Timed Code Treatment Minutes :   30 minutes   OT Total Untimed Code Treatment Minutes :   15 minutes   OT Total Treatment Time Rehab :   45 minutes   DAVIS,  CHERYL A - 2015-03-31 13:12 EDT   Assessment   OT Impairments or Limitations :   Balance deficits, Basic activity of daily living deficits, Endurance deficits, Mobility deficits   OT Discharge Recommendations :   Pt would benefit from d/c to rehab prior to home for improved I in ADLs & functional mobility.      OT Treatment Recommendations :   Pt s/p fall with pelvic fx, presenting with impaired balance, decreased ADL tolerance, needing Min A for LB ADL tasks. Pt would benefit from cont OT during IP stay for maximizing I.      DAVIS,  CHERYL A - Mar 31, 2015 13:12 EDT   OT G-Codes and Modifiers   Primary Functional Limitation Current Status (ref) :   Self-Care   Self-Care Current Status (H-1012) :   CK At least 40% but less than 60% impaired   Self-Care Goal Status (H-1011) :   CI At least 1% but less than 20% impaired   DAVIS,  CHERYL A - Mar 31, 2015 13:12 EDT

## 2015-03-24 DIAGNOSIS — I10 Essential (primary) hypertension: Secondary | ICD-10-CM | POA: Diagnosis not present

## 2015-03-24 DIAGNOSIS — M25571 Pain in right ankle and joints of right foot: Secondary | ICD-10-CM | POA: Diagnosis not present

## 2015-03-24 DIAGNOSIS — E785 Hyperlipidemia, unspecified: Secondary | ICD-10-CM | POA: Diagnosis not present

## 2015-03-24 DIAGNOSIS — M25474 Effusion, right foot: Secondary | ICD-10-CM | POA: Diagnosis not present

## 2015-03-24 DIAGNOSIS — S329XXA Fracture of unspecified parts of lumbosacral spine and pelvis, initial encounter for closed fracture: Secondary | ICD-10-CM | POA: Diagnosis not present

## 2015-03-24 DIAGNOSIS — E876 Hypokalemia: Secondary | ICD-10-CM | POA: Diagnosis not present

## 2015-03-24 NOTE — Progress Notes (Signed)
Inpatient PT Examination - Text       Inpatient PT Examination Entered On:  03/24/2015 12:03 EDT    Performed On:  03/24/2015 9:14 EDT by Vedia Pereyra C               Reason for Treatment   Subjective Statement :   RN and pt agreeable to evluation. Pt finishing up withOT and was just medicated. She reports 8/10 pain, increase near end of walk to 9/10 pain. Eager to get home safely    Pt is visiting [place] from [place]. She [place] a couple [place]. Sister saw this happen and assisted her to the couch. She was fine, but when she attempted to get up to go to the bathroom in the middle of the night she couldn't walk. Sister had to drag her on a towel to bathroom and then called EMS     *Reason for Referral :   WB status clarified under consults for PT - Lowery orders full weight bearing as tolerated     PT consult - weakness, fall risk    osteopenic occult fx to R foot (Ortho MD order post op shoe for comfort)  superior and inferior pubic rami fx on R side  possible sacral fx    PMH: macular degeneration     Vedia Pereyra C - 03/24/2015 11:43 EDT   General Info   Physical Therapy Orders :   PT Evaluation and Treatment Acute - 03/23/15 9:18:00 EDT, AM performed - prior to noon, Eval for SNF/Rehab, Stop date 03/23/15 9:18:00 EDT, please see patient today for roper rehab referral     Precautions RTF :    Communication Order, 03/24/15 10:19:00 EDT, Please get patient a postoperative shoe from the recovery room on the first floor. This is for her right foot.    I will notify them and try to help out., 03/24/15 10:19:00 EDT, 03/24/15 10:19:00 EDT, Ordered   Weight Bearing, 03/23/15 13:35:00 EDT, Right Lower Extremity, please clarify weightbearing status for PT & OT. Thank you!, Discontinued   Home Med Hx Update Notification, 03/22/15 17:14:00 EDT, 03/22/15 17:14:00 EDT, 03/22/15 17:14:00 EDT, Ordered   Communication Order, 03/22/15 15:59:00 EDT, RD may manage/modify diet order and/or  enteral nutrition per approved MNT protocol, 03/22/15 15:59:00 EDT, 03/22/15 15:59:00 EDT, Ordered   Hospitalist Team Color, 03/22/15 15:59:00 EDT, Blue, Constant Indicator, Ordered   Notify Rapid Response Team, 03/22/15 15:59:00 EDT, For concerns regarding patient condition & notify MD, 03/22/15 15:59:00 EDT, 03/22/15 15:59:00 EDT, Ordered   Change attending to, 03/22/15 11:49:00 EDT, HAWK-MD,  Adelene Amas, Ordered     Orientation Assessment :   Oriented x 4   Affect/Behavior :   Appropriate, Cooperative   Basic Command Following :   Intact   Safety/Judgment :   Impaired   (Comment: cues for safety [WALTERS,  WHITNEY C - 03/24/2015 11:43 EDT] )   Pain Present :   Yes actual or suspected pain   Laurine Blazer,  WHITNEY C - 03/24/2015 11:43 EDT   Problem List   (As Of: 03/24/2015 12:03:17 EDT)   Problems(Active)    Arthritis (SNOMED CT  :[id number] )  Name of Problem:   Arthritis ; Recorder:   GREGOR, RN, NANCY A; Confirmation:   Confirmed ; Classification:   Medical ; Code:   [id number] ; Contributor System:   PowerChart ; Last Updated:   03/22/2015 7:07 EDT ; Life Cycle Date:   03/22/2015 ;  Life Cycle Status:   Active ; Vocabulary:   SNOMED CT        At risk of pressure ulcer (SNOMED CT  :[id number] )  Name of Problem:   At risk of pressure ulcer ; Recorder:   SYSTEM,  SYSTEM; Confirmation:   Confirmed ; Classification:   Nursing ; Code:   [id number] ; Last Updated:   03/24/2015 9:18 EDT ; Life Cycle Date:   03/24/2015 ; Life Cycle Status:   Active ; Vocabulary:   SNOMED CT        Fall (SNOMED CT  :[id number] )  Name of Problem:   Fall ; Onset Date:   03/24/2015 ; Recorder:   SYSTEM,  SYSTEM; Confirmation:   Confirmed ; Code:   [id number]:   [id number] ; Last Updated:   03/24/2015 9:18 EDT ; Life Cycle Date:   03/24/2015 ; Life Cycle Status:   Active ; Vocabulary:   SNOMED CT   ; Comments:        03/24/2015 9:18 - SYSTEM,  SYSTEM  Problem added by Discern Expert      Hypertension (SNOMED CT  :[id number] )   Name of Problem:   Hypertension ; Recorder:   GREGOR, RN, NANCY A; Confirmation:   Confirmed ; Classification:   Medical ; Code:   [id number] ; Contributor System:   PowerChart ; Last Updated:   03/22/2015 6:59 EDT ; Life Cycle Date:   03/22/2015 ; Life Cycle Status:   Active ; Vocabulary:   SNOMED CT        Macular degeneration (SNOMED CT  :AZEGxwEmLzUcjdjvwKgAAg )  Name of Problem:   Macular degeneration ; Recorder:   GREGOR, RN, NANCY A; Confirmation:   Confirmed ; Classification:   Medical ; Code:   AZEGxwEmLzUcjdjvwKgAAg ; Contributor System:   DietitianowerChart ; Last Updated:   03/22/2015 6:59 EDT ; Life Cycle Date:   03/22/2015 ; Life Cycle Status:   Active ; Vocabulary:   SNOMED CT        PVC (premature ventricular contraction) (SNOMED CT  :[phone] )  Name of Problem:   PVC (premature ventricular contraction) ; Recorder:   Rubye OaksICKERSON, RN, SUSAN; Confirmation:   Confirmed ; Classification:   Patient Stated ; Code:   [phone] ; Contributor System:   DietitianowerChart ; Last Updated:   03/22/2015 15:26 EDT ; Life Cycle Date:   03/22/2015 ; Life Cycle Status:   Active ; Vocabulary:   SNOMED CT          Diagnoses(Active)    Closed pelvic fracture  Date:   03/22/2015 ; Diagnosis Type:   Discharge ; Confirmation:   Confirmed ; Clinical Dx:   Closed pelvic fracture ; Classification:   Medical ; Clinical Service:   Non-Specified ; Code:   ICD-10-CM ; Probability:   0 ; Diagnosis Code:   S32.9XXA      Fall  Date:   03/22/2015 ; Diagnosis Type:   Reason For Visit ; Confirmation:   Complaint of ; Clinical Dx:   Fall ; Classification:   Medical ; Clinical Service:   Emergency medicine ; Code:   PNED ; Probability:   0 ; Diagnosis Code:   [id number]      Fall  Date:   03/22/2015 ; Diagnosis Type:   Discharge ; Confirmation:   Confirmed ; Clinical Dx:   Fall ; Classification:   Medical ; Clinical Service:   Non-Specified ; Code:   ICD-10-CM ; Probability:   0 ; Diagnosis Code:   [id number]  Foot injury - Minor   Date:   03/22/2015 ; Diagnosis Type:   Reason For Visit ; Confirmation:   Complaint of ; Clinical Dx:   Foot injury - Minor ; Classification:   Medical ; Clinical Service:   Emergency medicine ; Code:   PNED ; Probability:   0 ; Diagnosis Code:   [id number]      Gait instability  Date:   03/24/2015 ; Diagnosis Type:   Reason For Visit ; Confirmation:   Confirmed ; Clinical Dx:   Gait instability ; Classification:   Interdisciplinary ; Clinical Service:   Non-Specified ; Code:   ICD-10-CM ; Probability:   0 ; Diagnosis Code:   R26.81      Hand contusion  Date:   03/22/2015 ; Diagnosis Type:   Discharge ; Confirmation:   Confirmed ; Clinical Dx:   Hand contusion ; Classification:   Medical ; Clinical Service:   Non-Specified ; Code:   ICD-10-CM ; Probability:   0 ; Diagnosis Code:   Q46.962X      Hip injury - Minor  Date:   03/22/2015 ; Diagnosis Type:   Reason For Visit ; Confirmation:   Complaint of ; Clinical Dx:   Hip injury - Minor ; Classification:   Medical ; Clinical Service:   Emergency medicine ; Code:   PNED ; Probability:   0 ; Diagnosis Code:   [id number]      Inability to walk  Date:   03/22/2015 ; Diagnosis Type:   Discharge ; Confirmation:   Confirmed ; Clinical Dx:   Inability to walk ; Classification:   Medical ; Clinical Service:   Non-Specified ; Code:   ICD-10-CM ; Probability:   0 ; Diagnosis Code:   R26.2      Pain in right knee  Date:   03/22/2015 ; Diagnosis Type:   Discharge ; Confirmation:   Confirmed ; Clinical Dx:   Pain in right knee ; Classification:   Medical ; Clinical Service:   Non-Specified ; Code:   ICD-10-CM ; Probability:   0 ; Diagnosis Code:   M25.561      Right foot sprain  Date:   03/22/2015 ; Diagnosis Type:   Discharge ; Confirmation:   Confirmed ; Clinical Dx:   Right foot sprain ; Classification:   Medical ; Clinical Service:   Non-Specified ; Code:   ICD-10-CM ; Probability:   0 ; Diagnosis Code:   S93.601A        Pain  Assessment   Duration :   points to R ischium as point of most pain. Reports she didn't have pain in R foot until after ortho MD palpated (not overly painful with weight bearing). pain slightly increased with mobility   Self Report Pain :   Numeric rating scale   Dene Gentry - 03/24/2015 11:43 EDT   Home Environment   Living Environment :   Home Environment  Devices/Equipment at Home:  Dan Humphreys - Rolling  Performed By:  Kelby Aline 03/23/2015  Lives In:  Single level home  Performed By:  Kelby Aline 03/23/2015  Lives With:  Alone  Performed By:  Kelby Aline 03/23/2015  Living Situation:  Home independently  Performed By:  Kelby Aline 03/23/2015     Living Situation :   Home independently   Lives With :   Alone   Lives In :   Single level home   Patient's Responsibilities :  Community mobility, Hospital doctor, Leisure/Play/Hobbies, Sales executive, Personal ADL, Shopping   Detail Areas of Responsibilities :   Pt is visiting [place] on vacation with her sister from [place]. Pt normally lives in [place] in [facility] with 4 steps+ rail to enter. She doesn't use an AD for ambulation and doesn't require assist for ADLs. She is still driving and is a community    (Comment: ambulator. Son lives nearby and comes over a couple times a week. She has a very supportive group of friends/neighbors as well.  Vedia Pereyra C - 03/24/2015 11:43 EDT] )   Vedia Pereyra C - 03/24/2015 11:43 EDT   Home Environment II   Living Environment :   Living Situation: Home independently  Current Home Treatments:   Home Devices/Equipment   Professional Skilled Services:   Therapist, sports and Community Resources:   Sensory Deficits:   Performed by: Drema Balzarine A-03/23/15 09:55:00    Living Situation: Home independently  Current Home Treatments:   Home Devices/Equipment   Professional Skilled Services:   Therapist, sports and Community Resources:   Sensory Deficits:   Performed byEvelena Leyden,  IONE-03/22/15  12:08:00       Devices/Equipment :   Cathren Laine,  WHITNEY C - 03/24/2015 11:43 EDT   Prior Functional Status   ADL :   Independent   Mobility :   Independent   Instrumental ADL :   Independent   Cognitive-Communication Skills :   Prince Rome C - 03/24/2015 11:43 EDT   LE Range/Strength   LE Overall Range of Motion Grid   Left Lower Extremity Passive Range :   Within functional limits   Left Lower Extremity Active Range :   Within functional limits   Right Lower Extremity Passive Range :   Within functional limits   Right Lower Extremity Active Range :   Within functional limits   WALTERS,  WHITNEY C - 03/24/2015 11:43 EDT   Left Lower Extremity Strength Grid   Hip Flexion :   4   Knee Extension :   4   Ankle Dorsiflexion :   4   Ankle Plantarflexion :   4   WALTERS,  WHITNEY C - 03/24/2015 11:43 EDT   Right Lower Extremity Strength Grid   Hip Flexion :   3   Knee Extension :   3+   Ankle Dorsiflexion :   4-   Ankle Plantarflexion :   4-   (Comment: able to tolerate mild resistance. no heavy resistance applied.  Laurine Blazer,  WHITNEY C - 03/24/2015 11:43 EDT] )   UE Strength/ROM   Upper Extremity Overall ROM Grid   Left Upper Extremity Passive Range :   Within functional limits   Left Upper Extremity Active Range :   Within functional limits   Right Upper Extremity Passive Range :   Within functional limits   Right Upper Extremity Active Range :   Within functional limits   Laurine Blazer,  WHITNEY C - 03/24/2015 11:43 EDT   Mobility   Mobility Grid   Transfer Sit to Stand :   Rehab Minimal assistance   (Comment: cues for hand placement with RW [WALTERS,  WHITNEY C - 03/24/2015 11:43 EDT] )   Transfer Stand to Sit :   Rehab Minimal assistance   (Comment: cues to reach back to sit down [WALTERS,  WHITNEY C - 03/24/2015 11:43 EDT] )   Vedia Pereyra C - 03/24/2015 11:43  EDT   Ambulation Level :   Minimal contact assistance   Ambulation Quality :   minA for ambulation with RW. extremely small, short  steps, but not antalgic gait pattern. Able to support herself with upper body. no buckling or LOB   Ambulation Distance :   75 ft   Device :   Gait belt, Rolling walker   WALTERS,  WHITNEY C - 03/24/2015 11:43 EDT   Assessment   PT Impairments or Limitations :   Ambulation deficits, Balance deficits, Endurance deficits, Equipment training, Pain limiting function, Range of motion deficits, Safety awareness deficits, Strength deficits, Transfer deficits   Barriers to Safe Discharge PT :   Safety awareness, Severity of deficits   Discharge Recommendations :   Pt would benefit form short rehab stay to improve safety and moblity independnce prior to returning home.     Pt is wanting to go home rather than go to rehab since she is from out of town and will have increased family/social support there. If she decides to do so, she will require 24/7 assist, RW and some sort of physical therapy services at d/c. SHe may also benefit from using w/c for long distances and community mobiltiy (MD visits) due to significant pain increased with even short mobility. Per CM Lenor Derrick, she will have daughter and granddaughter to assist her at home 24/7     PT Treatment Recommendations :   TREATMENT: Pt found sitting EOB with OT finishing session; continue to sit EOB with SBA for balance for PT assesment and history; sit to stand with minA and cues for hand placement with RW; amb 75 ft with RW minA with cues for limited twisting/pivoting, upright posture and RW use; return to room and seated on recliner with waffle cushion; BLE elevated, instruct 10xB ankle pumps, glut/quad sets. All needs in reach    ASSESSMENT: Pt s/p mechanical fall down stairs with resulting R foot fx, 2 pelvic fx on R, and possible sacral fx. Ortho has declared her non-op and WBAT. She is able to tolerate ambulation and mobility in room and hallway with RW to assist in offloading RLE for pain relief. She is steady with RW, but extremely slow and taking short  steps to avoid painful movements. SHe would continue to benefit from skilled IPPT to progress strength, gait/transfer/stair training, safety education, caregiver training, and balance training. Pt will require increased assist from family and friends once home after rehab.      Vedia Pereyra C - 03/24/2015 11:43 EDT   DME   PT Equipment Anticipated or Recommended :   Rolling walker   Vedia Pereyra C - 03/24/2015 11:43 EDT   Long Term Goals   Bed, Chair, Wheelchair Goal   Bed, Chair, Wheelchair Goal :   Supervision or setup   Toilet Transfer Goal :   Supervision or setup   Ambulation Level Surfaces Goal :   Supervision or setup   Wheelchair Mobility Level Surfaces Goal :   Supervision or setup   Stairs Ambulation :   Supervision or setup   Dene Gentry - 03/24/2015 11:43 EDT   Mode of Locomotion Goal :   Walk   PT LT Goals Reviewed :   Loletta Parish,  WHITNEY C - 03/24/2015 11:43 EDT   Short Term Goals   Other PT Goals Grid     Goal #1  Goal #2  Goal #3  Goal #4    Goal :  bed mobility SBA   transfers SBA RW   amb 200 ft, RW SBA WBAT   stairs CGA     Status :    Initial   Initial   Initial   Initial       Laurine Blazer,  WHITNEY C - 04/05/2015 11:43 EDT  Vedia Pereyra C - 04/05/15 11:43 EDT  Vedia Pereyra C - 05-Apr-2015 11:43 EDT  Vedia Pereyra C - 04-05-15 11:43 EDT      PT ST Goals Reviewed :   Loletta Parish,  WHITNEY C - 04-05-15 11:43 EDT   Plan   PT Frequency Acute :   Daily   Duration :   7    PT Duration Unit Rehab :   Days   Treatments Planned :   Balance training, Basic activities of daily living, Bed mobility training, Caregiver training, Equipment training, Functional training, Gait training, Posture/Body mechanics training, Patient education, Stair training, Therapeutic activities, Therapeutic exercises, Transfer training   Treatment Plan/Goals Established With Patient/Caregiver :   Yes   Evaluation Complete :   Yes   Vedia Pereyra C - 2015-04-05 11:43 EDT   Time Spent With Patient    PT Evaluation Units, Low Complexity :   1 Unit   PT Individual Eval Time, Low Complexity :   14 minutes   PT Therapeutic Activity Units :   1 units   PT Therapeutic Activity Time :   20 minutes   PT Total Individual Min :   14    PT Total Timed Code Treatment Units :   1 units   PT Total Timed Code Min :   20    PT Total Untimed Min Ac/Outp :   14    PT Total Treatment Time Ac/Outp :   34    WALTERS,  WHITNEY C - 2015-04-05 11:43 EDT   PT G-Codes and Modifiers   Primary Functional Limitation Current Status (ref) :   Mobility   Mobility Current Status [phone]) :   CK At least 40% but less than 60% impaired   Current Status Selection Method :   Used clinical judgment   Primary Functional Limitation Goal Status (ref) :   Mobility   Mobility Goal Status [phone]) :   CI At least 1% but less than 20% impaired   WALTERS,  WHITNEY C - Apr 05, 2015 11:43 EDT

## 2015-03-24 NOTE — Nursing Note (Signed)
Medication Administration Follow Up-Text       Medication Administration Follow Up Entered On:  03/24/2015 21:46 EDT    Performed On:  03/24/2015 9:23 EDT by Earlene PlaterAVIS, RN, Luellen PuckerJANIE M      Intervention Information:     morphine  Performed by Paris LoreWILEY, RN, Cristy FriedlanderVALERIE M on 03/24/2015 09:08:00 EDT       morphine,2mg   IV Push,Antecubital, Left,severe pain (8-10)       Medication Effectiveness Evaluation   Medication Administration Reason :   Pain   Medication Effective :   Yes   Medication Response :   Symptoms improved   DAVIS, RN, Luellen PuckerJANIE M - 03/24/2015 21:46 EDT

## 2015-03-24 NOTE — Progress Notes (Signed)
Inpatient OT Daily Documentation - Text       Inpatient OT Daily Documentation Entered On:  03/24/2015 9:54 EDT    Performed On:  03/24/2015 9:48 EDT by Drema Balzarine A               Review/Treatments Provided   OT Goals :   OT Short Term Goals    03/23/2015  Other Goal #1: LB dressing, SPV with AE PRN; Initial  Other Goal #2: LB bathing, SPV with AE PRN; Initial  Other Goal #3: toileting & toilet t/f, SPV; Initial  Other Goal #4: Pt willl demo ability to complete x15 min standing activity, rest breaks as needed, SPV.; Initial     OT Plan :   Treatment Duration: 2 Performed By: Drema Balzarine A   03/23/2015  Planned Treatments: Balance training, Basic Activities of Daily Living, Energy conservation training, Mobility training, Patient education, Safety education, Therapeutic activities, Therapeutic exercises, Therapeutic exercises for strengthening and ROM Performed By: Drema Balzarine A   03/23/2015     Short Term Goals Reviewed :   Yes   Pain Present :   No actual or suspected pain   OT Therapeutic Exercise :   Yes   DAVIS,  CHERYL A - 03/24/2015 9:48 EDT   Therapeutic Exercise   OT Therapeutic Exercise Grid     Exercise 1  Exercise 2        Exercise :    Upper extremity strengthening   Cardiovascular/Conditioning           Completed :    Yes   Yes           Comments :    for increased BUE strengthening, pt completed bed level isometric strengthening 3x1 min shld abduction holds, 3x1 min outreached to midline shld holds at 90*, 3x1 min shld abduction alternating pronatio/supination   pt supine>sit w/ SPV to R side of bed  and sitting unsupported. Min A to don grippy socks. PT entering room & OT d/c'd to allow for further ambulation after medication just administered, pt with PT in room & all needs met             DAVIS,  CHERYL A - 03/24/2015 9:48 EDT  DAVIS,  CHERYL A - 03/24/2015 9:48 EDT        Short Term Goals   Other OT Goal Grid     Goal #1  Goal #2  Goal #3  Goal #4    Goal :    LB dressing, SPV with AE PRN   LB  bathing, SPV with AE PRN   toileting & toilet t/f, SPV   Pt willl demo ability to complete x15 min standing activity, rest breaks as needed, SPV.     Status :    Initial   Initial   Initial   Initial       DAVIS,  CHERYL A - 03/24/2015 9:48 EDT  DAVIS,  CHERYL A - 03/24/2015 9:48 EDT  DAVIS,  CHERYL A - 03/24/2015 9:48 EDT  DAVIS,  CHERYL A - 03/24/2015 9:48 EDT      OT ST Goals Reviewed :   Yes   DAVIS,  CHERYL A - 03/24/2015 9:48 EDT   Time Spent With Patient   OT Therapeutic Exercise Units :   2 units   OT Therapeutic Exercise Time :   24 minutes   OT Total Individual Therapy Time :   24 minutes   OT  Total Timed Code Treatment Units :   2 units   OT Total Timed Code Treatment Minutes :   24 minutes   OT Total Treatment Time Rehab :   24 minutes   DAVIS,  CHERYL A - 03/24/2015 9:48 EDT   Assessment   OT Impairments or Limitations :   Balance deficits, Basic activity of daily living deficits, Endurance deficits, Mobility deficits   OT Discharge Recommendations :   Pt would benefit from d/c to rehab prior to home for improved I in ADLs & functional mobility.      OT Treatment Recommendations :   Pt continue to progress with bed mobility & very motivated for return to I. Good tolerance & return demo of isometric BUE strengthening today, given pt will need increased reliance on upper body strength in using walker during recovery. Cont w/ POC.      Drema BalzarineDAVIS,  CHERYL A - 03/24/2015 9:48 EDT   OT G-Codes and Modifiers   G-Code Required :   No   DAVIS,  CHERYL A - 03/24/2015 9:48 EDT

## 2015-03-24 NOTE — Nursing Note (Signed)
PPD Reading - Text       PPD Reading Entered On:  03/24/2015 16:53 EDT    Performed On:  03/24/2015 16:53 EDT by Paris Lore RN, Cristy Friedlander               PPD Reading   PPD Interpretation :   Francia Greaves, RN, Cristy Friedlander - 03/24/2015 16:53 EDT

## 2015-03-24 NOTE — Case Communication (Signed)
CM Discharge Planning Assessment - Text       CM Discharge Planning Ongoing Assessment Entered On:  03/24/2015 14:34 EDT    Performed On:  03/24/2015 14:33 EDT by Adela Ports L               Discharge Needs I   Previously Documented Discharge Needs :   DISCHARGE PLAN/NEEDS:  Anticipated Discharge Date: 03/25/2015 - Kelby Aline - 03/23/15 15:35:00  Discharge To, Anticipated: Home with family support - Kelby Aline - 03/23/15 15:35:00  Needs Assistance with Transportation: Yes Kelby Aline - 03/23/15 15:35:00  EQUIPMENT/TREATMENT NEEDS:  Needs Assistance at Home Upon Discharge: Yes Kelby Aline - 03/23/15 15:35:00  Professional Skilled Services: Physical Therapy - Kelby Aline - 03/23/15 15:35:00       Previously Documented Benefits Information :   Performed By: Kelby Aline  - 03/23/15 15:35:00    Performed By: Gwenith Spitz  - 03/22/15 12:08:00       Anticipated Discharge Date :   03/25/2015 EDT   Anticipated Discharge Time Slot :   1400-1600   Discharge To :   Home with family support   Home Caregiver Name/Relationship :     Hermelinda Medicus- Sister   Home Caregiver Phone Number :     # [phone]   CM Initial Note :   03/22/2015  I met with patient at bedside. Pt is a 75 year old lady visiting [place] with her sister. She lives in [place] and states that she is independant with her ADL's, walks on her own and drives. She fell down a flight of steps and has been unable to walk. She has a closed Pelvic fracture amd Dr Judith Blonder is admitting her for this. Patient is awaiting bed assignment and I will SBAR the appropriate CM.  Gwenith Spitz LMSW      03/23/2015  - TS - Met with patient and her sister this AM - to discuss DC plans. Currently, patient is waiting for orto eval so that PT can eval her. CM provided her with a list of Acute and Sub Acute rehab list. Patient states she is hopeful she will get well enough to go home to [place] and have OP therapy with her kids  assistance. CM will follow up again after eval and PT eval. Patient will need at the least possibly a walker at time of DC if she decides to go home. CM is following, Referral was sent to [facility] for review.     03/24/15--JH:  patient seen by pt and ok to go home with sister to [place].  expect discharge in am. has followup visits with md arranged. will follow.      Adela Ports L - 03/24/2015 14:33 EDT

## 2015-03-25 DIAGNOSIS — E876 Hypokalemia: Secondary | ICD-10-CM | POA: Diagnosis not present

## 2015-03-25 DIAGNOSIS — I10 Essential (primary) hypertension: Secondary | ICD-10-CM | POA: Diagnosis not present

## 2015-03-25 DIAGNOSIS — S329XXA Fracture of unspecified parts of lumbosacral spine and pelvis, initial encounter for closed fracture: Secondary | ICD-10-CM | POA: Diagnosis not present

## 2015-03-25 DIAGNOSIS — E785 Hyperlipidemia, unspecified: Secondary | ICD-10-CM | POA: Diagnosis not present

## 2015-03-25 NOTE — Discharge Summary (Signed)
 Inpatient Patient Summary       ;        [facility]  [address]  [place], [place] [address]  [phone]  Patient Discharge Instructions     Name: Kendra Dominguez, Kendra Dominguez [facility]  Current Date: 03/25/2015 10:38:32  DOB: [birth year] MRN: [id number] FIN: [phone]  Patient Address: [address] [place] [address]  Patient Phone: [phone]  Primary Care Provider:  Name: Pcp, None  Phone:    Immunizations Provided:        Immunization(s) Given This Visit   Name Date   tuberculin purified protein derivative 03/22/15 17:18:00           Discharge Diagnosis: Closed pelvic fracture; Fall; Hand contusion; Inability to walk; Pain in right knee; Right foot sprain  Discharged To: TO, ANTICIPATED%>Home with family support  Home Treatments: TREATMENTS, ANTICIPATED%>  Devices/Equipment: EQUIPMENT REHAB%>[employer] - Clinical research associate Hospital Services: HOSPITAL SERVICES%>  Professional Skilled Services: SKILLED SERVICES%>  Therapist, sports and Community Resources:                SERV AND COMM RES, ANTICIPATED%>  Mode of Discharge Transportation: TRANSPORTATION%>  Discharge Orders         Discharge Patient 03/25/15 9:27:00 EDT         Comment:      Medications   During the course of your visit, your medication list was updated with the most current information. The details of those changes are reflected below:         New Medications  Other Medications  calcium carbonate (calcium carbonate 500 mg (200 mg elemental calcium) oral tablet, chewable) 1 Tabs Oral (given by mouth) 2 times a day.  Last Dose:____________________  HYDROcodone-acetaminophen (HYDROcodone-acetaminophen 5 mg-325 mg oral tablet) 1 Tabs Oral (given by mouth) every 6 hours as needed.  Last Dose:____________________  Medications That Were Updated - Follow Current Instructions  Other Medications  Current: aspirin (aspirin 81 mg oral tablet) 1 Tabs Oral (given by mouth) every day.  Last Dose:____________________    Current: metoprolol (Metoprolol Tartrate 25  mg oral tablet) 0.5 Tabs Oral (given by mouth) 2 times a day.  Last Dose:____________________    Current: valsartan (valsartan 160 mg oral tablet) 1 Tabs Oral (given by mouth) every day.  Last Dose:____________________    Medications That Have Not Changed  Other Medications  atorvastatin (atorvastatin 20 mg oral tablet) 1 Tabs Oral (given by mouth) every day.  Last Dose:____________________  cholecalciferol (cholecalciferol oral tablet) unknown dose Oral (given by mouth) every day. patient states she takes 1 tablet by mouth daily.  Last Dose:____________________  multivitamin with minerals (PreserVision AREDS 2) 1 Capsules Oral (given by mouth) 2 times a day.  Last Dose:____________________  naproxen (Aleve 220 mg oral tablet) 1 Tabs Oral (given by mouth) 2 times a day as needed.  Last Dose:____________________  ocular lubricant (Systane ophthalmic solution) 1 Drops Both eyes once a day (in the morning) as needed.,  THIS IS A PATIENT SPECIFIC MEDICATION IN YOUR PYXIS MACHINE   Last Dose:____________________  trolamine salicylate topical (Aspercreme Back & Body) 1 Patches Topical (on the skin) every day as needed.  Last Dose:____________________         [facility] would like to thank you for allowing us  to assist you with your healthcare needs. The following includes patient education materials and information regarding your injury/illness.     Dominguez, Kendra BRICKLE has been given the following list of follow-up instructions, prescriptions, and patient education materials:  Follow-up Instructions             With: Address: When:   Patient has received all documents to take to her appointment on Monday         With: Address: When:   Please call Hospitalist Services at [phone] with any questons concerning discharge instructions         With: Address: When:   Patient has a follow up at [facility] on Mar 29, 2015 at 3:45 pm phone is [phone]         With: Address: When:   None Pcp                          It is important to always keep an active list of medications available so that you can share with other providers and manage your medications appropriately. As an additional courtesy, we are also providing you with your final active medications list that you can keep with you.           aspirin (aspirin 81 mg oral tablet) 1 Tabs Oral (given by mouth) every day.  atorvastatin (atorvastatin 20 mg oral tablet) 1 Tabs Oral (given by mouth) every day.  calcium carbonate (calcium carbonate 500 mg (200 mg elemental calcium) oral tablet, chewable) 1 Tabs Oral (given by mouth) 2 times a day.  cholecalciferol (cholecalciferol oral tablet) unknown dose Oral (given by mouth) every day. patient states she takes 1 tablet by mouth daily.  HYDROcodone-acetaminophen (HYDROcodone-acetaminophen 5 mg-325 mg oral tablet) 1 Tabs Oral (given by mouth) every 6 hours as needed.  metoprolol (Metoprolol Tartrate 25 mg oral tablet) 0.5 Tabs Oral (given by mouth) 2 times a day.  multivitamin with minerals (PreserVision AREDS 2) 1 Capsules Oral (given by mouth) 2 times a day.  naproxen (Aleve 220 mg oral tablet) 1 Tabs Oral (given by mouth) 2 times a day as needed.  ocular lubricant (Systane ophthalmic solution) 1 Drops Both eyes once a day (in the morning) as needed.,  THIS IS A PATIENT SPECIFIC MEDICATION IN YOUR PYXIS MACHINE   trolamine salicylate topical (Aspercreme Back & Body) 1 Patches Topical (on the skin) every day as needed.  valsartan (valsartan 160 mg oral tablet) 1 Tabs Oral (given by mouth) every day.      Take only the medications listed above. Contact your doctor prior to taking any medications not on this list.        Discharge instructions, if any, will display below     Instructions for Diet: INSTRUCTIONS FOR DIET%>   Instructions for Supplements: SUPPLEMENT INSTRUCTIONS%>   Instructions for Activity: INSTRUCTIONS FOR ACTIVITY%>   Instructions for Wound Care: INSTRUCTIONS FOR WOUND CARE%>      Medication leaflets, if any, will display below         Patient education materials, if any, will display below        Pelvic Fracture, Stable   You have a break (fracture) of the pelvic bone. Your fracture is stable because the bones are not out of place and there are no signs of serious internal bleeding. No surgery or other special treatment will be needed. As long as your pain is controlled by oral medicine, you can be treated at home. A broken pelvis will take about 6 to 8 weeks to heal and can be painful with movement for the first three to four weeks.      Home Care:    Bed rest and  pain medicine is the only treatment required. Stay in bed for the first two to three days to reduce pain with movement. During this time, you will need help with bathing, toilet needs and meals. A bedpan or bedside commode may be easier to use than getting up to the bathroom. As soon as possible, begin sitting or walking to avoid problems with prolonged bed rest (muscle weakness, worsening back stiffness and pain, blood clots in the legs). A walker, crutches or cane will make walking easier in the first three weeks.    Home health care may be available to provide in-home nursing services. Check with your doctor, the hospital's social service department or through private nursing agencies to see if your insurance will cover this kind of care.    During the first two days after the injury there will probably be localized swelling and bruising on the skin over the pelvis. During this time apply an ICE PACK to the painful area for 20 minutes every 2-4 hours to reduce swelling and pain.    Take pain medicine as directed. Call your doctor if your pain is not well controlled. A dose change or stronger medicine may be needed.   Follow Up   with your doctor in one week, or as advised by our staff, to be sure the bone is healing properly.   [NOTE: If X-rays were taken, they will be reviewed by a radiologist. You will be notified of  any new findings that may affect your care.]   Get Prompt Medical Attention   if any of the following occur:    Pain becomes worse or you are unable to walk with assistance for more than three days.    Blood in your urine or bleeding from the urethra (the opening where urine comes out)    Difficulty passing urine or unable to pass stool due to pain    Fever of 100.45F (38C) or higher, or as directed by your healthcare provider    Swelling, pain or redness below your knee    Chest pain or shortness of breath      2000-2015 [employer] [address], [place], [place] [address]. All rights reserved. This information is not intended as a substitute for professional medical care. Always follow your healthcare professional's instructions.               IS IT A STROKE?  Act FAST and Check for these signs:     FACE                  Does the face look uneven?     ARM                    Does one arm drift down?     SPEECH             Does their speech sound strange?     TIME                   Call 9-1-1 at any sign of stroke  Heart Attack Signs  Chest discomfort: Most heart attacks involve discomfort in the center of the chest and lasts more than a few minutes, or goes away and comes back. It can feel like uncomfortable pressure, squeezing, fullness or pain.  Discomfort in upper body: Symptoms can include pain or discomfort in one or both arms, back, neck, jaw or stomach.  Shortness of breath: With or without  discomfort.  Other signs: Breaking out in a cold sweat, nausea, or lightheaded.  Remember, MINUTES DO MATTER. If you experience any of these heart attack warning signs, call 9-1-1 to get immediate medical attention!             Yes - Patient/Family/Caregiver demonstrates understanding of instructions given  ______________________________ ___________ ___________________ ___________  Patient/Family/ Caregiver Signature Date/Time          Provider Signature Date/Time

## 2015-03-25 NOTE — Discharge Summary (Signed)
 Inpatient Clinical Summary             [facility]  Post-Acute Care Transfer Instructions  PERSON INFORMATION   Name: Kendra Dominguez, Kendra Dominguez   MRN: [id number]    FIN#: [id number]   PHYSICIANS  Admitting Physician: KATY LYNWOOD RAMAN  Attending Physician: KATY LYNWOOD RAMAN   PCP: Pcp, None  Discharge Diagnosis: Closed pelvic fracture; Fall; Hand contusion; Inability to walk; Pain in right knee; Right foot sprain  Comment:       PATIENT EDUCATION INFORMATION  Instructions:             PELVIC FRACTURE  Medication Leaflets:               Follow-up:                          With: Address: When:   Patient has received all documents to take to her appointment on Monday         With: Address: When:   Please call Hospitalist Services at [phone] with any questons concerning discharge instructions         With: Address: When:   Patient has a follow up at [facility] on Mar 29, 2015 at 3:45 pm phone is [phone]         With: Address: When:   None Pcp                             MEDICATION LIST  Medication Reconciliation at Discharge:         New Medications  Other Medications  calcium carbonate (calcium carbonate 500 mg (200 mg elemental calcium) oral tablet, chewable) 1 Tabs Oral (given by mouth) 2 times a day.  Last Dose:____________________  HYDROcodone-acetaminophen (HYDROcodone-acetaminophen 5 mg-325 mg oral tablet) 1 Tabs Oral (given by mouth) every 6 hours as needed.  Last Dose:____________________  Medications That Were Updated - Follow Current Instructions  Other Medications  Current: aspirin (aspirin 81 mg oral tablet) 1 Tabs Oral (given by mouth) every day.  Last Dose:____________________    Current: metoprolol (Metoprolol Tartrate 25 mg oral tablet) 0.5 Tabs Oral (given by mouth) 2 times a day.  Last Dose:____________________    Current: valsartan (valsartan 160 mg oral tablet) 1 Tabs Oral (given by mouth) every day.  Last Dose:____________________    Medications That Have Not  Changed  Other Medications  atorvastatin (atorvastatin 20 mg oral tablet) 1 Tabs Oral (given by mouth) every day.  Last Dose:____________________  cholecalciferol (cholecalciferol oral tablet) unknown dose Oral (given by mouth) every day. patient states she takes 1 tablet by mouth daily.  Last Dose:____________________  multivitamin with minerals (PreserVision AREDS 2) 1 Capsules Oral (given by mouth) 2 times a day.  Last Dose:____________________  naproxen (Aleve 220 mg oral tablet) 1 Tabs Oral (given by mouth) 2 times a day as needed.  Last Dose:____________________  ocular lubricant (Systane ophthalmic solution) 1 Drops Both eyes once a day (in the morning) as needed.,  THIS IS A PATIENT SPECIFIC MEDICATION IN YOUR PYXIS MACHINE   Last Dose:____________________  trolamine salicylate topical (Aspercreme Back & Body) 1 Patches Topical (on the skin) every day as needed.  Last Dose:____________________         Patient's Final Home Medication List Upon Discharge:          aspirin (aspirin 81 mg oral tablet) 1 Tabs Oral (given by mouth)  every day.  atorvastatin (atorvastatin 20 mg oral tablet) 1 Tabs Oral (given by mouth) every day.  calcium carbonate (calcium carbonate 500 mg (200 mg elemental calcium) oral tablet, chewable) 1 Tabs Oral (given by mouth) 2 times a day.  cholecalciferol (cholecalciferol oral tablet) unknown dose Oral (given by mouth) every day. patient states she takes 1 tablet by mouth daily.  HYDROcodone-acetaminophen (HYDROcodone-acetaminophen 5 mg-325 mg oral tablet) 1 Tabs Oral (given by mouth) every 6 hours as needed.  metoprolol (Metoprolol Tartrate 25 mg oral tablet) 0.5 Tabs Oral (given by mouth) 2 times a day.  multivitamin with minerals (PreserVision AREDS 2) 1 Capsules Oral (given by mouth) 2 times a day.  naproxen (Aleve 220 mg oral tablet) 1 Tabs Oral (given by mouth) 2 times a day as needed.  ocular lubricant (Systane ophthalmic solution) 1 Drops Both eyes once a day (in the morning) as  needed.,  THIS IS A PATIENT SPECIFIC MEDICATION IN YOUR PYXIS MACHINE   trolamine salicylate topical (Aspercreme Back & Body) 1 Patches Topical (on the skin) every day as needed.  valsartan (valsartan 160 mg oral tablet) 1 Tabs Oral (given by mouth) every day.         Comment:       ORDERS         Order Name Order Details   Discharge Patient 03/25/15 9:27:00 EDT

## 2015-03-29 DIAGNOSIS — S329XXA Fracture of unspecified parts of lumbosacral spine and pelvis, initial encounter for closed fracture: Secondary | ICD-10-CM | POA: Diagnosis not present

## 2015-04-06 DIAGNOSIS — M79671 Pain in right foot: Secondary | ICD-10-CM | POA: Diagnosis not present

## 2015-04-06 DIAGNOSIS — S329XXD Fracture of unspecified parts of lumbosacral spine and pelvis, subsequent encounter for fracture with routine healing: Secondary | ICD-10-CM | POA: Diagnosis not present

## 2015-04-06 DIAGNOSIS — S329XXA Fracture of unspecified parts of lumbosacral spine and pelvis, initial encounter for closed fracture: Secondary | ICD-10-CM | POA: Diagnosis not present

## 2015-04-06 DIAGNOSIS — S86011A Strain of right Achilles tendon, initial encounter: Secondary | ICD-10-CM | POA: Diagnosis not present

## 2015-04-13 DIAGNOSIS — S86011D Strain of right Achilles tendon, subsequent encounter: Secondary | ICD-10-CM | POA: Diagnosis not present

## 2015-04-13 DIAGNOSIS — M79671 Pain in right foot: Secondary | ICD-10-CM | POA: Diagnosis not present

## 2015-04-30 DIAGNOSIS — S329XXS Fracture of unspecified parts of lumbosacral spine and pelvis, sequela: Secondary | ICD-10-CM | POA: Diagnosis not present

## 2015-05-24 DIAGNOSIS — S329XXS Fracture of unspecified parts of lumbosacral spine and pelvis, sequela: Secondary | ICD-10-CM | POA: Diagnosis not present

## 2015-06-14 DIAGNOSIS — H353132 Nonexudative age-related macular degeneration, bilateral, intermediate dry stage: Secondary | ICD-10-CM | POA: Diagnosis not present

## 2015-06-14 DIAGNOSIS — H43813 Vitreous degeneration, bilateral: Secondary | ICD-10-CM | POA: Diagnosis not present

## 2015-06-14 DIAGNOSIS — H524 Presbyopia: Secondary | ICD-10-CM | POA: Diagnosis not present

## 2015-07-16 ENCOUNTER — Ambulatory Visit (INDEPENDENT_AMBULATORY_CARE_PROVIDER_SITE_OTHER): Payer: Medicare Other | Admitting: Physician Assistant

## 2015-07-16 VITALS — BP 150/70 | HR 73 | Temp 97.7°F | Resp 16 | Ht <= 58 in | Wt 86.0 lb

## 2015-07-16 DIAGNOSIS — L03113 Cellulitis of right upper limb: Secondary | ICD-10-CM | POA: Diagnosis not present

## 2015-07-16 MED ORDER — CEPHALEXIN 500 MG PO CAPS: 500.0000 mg | ORAL_CAPSULE | Freq: Three times a day (TID) | ORAL | Status: DC

## 2015-07-16 NOTE — Progress Notes (Signed)
   07/16/2015 10:16 AM   DOB: 1940/09/20 / MRN: [id number]  SUBJECTIVE:  Julie Durham is a 75 y.o. female presenting for a wasp sting sustained to the right hand yesterday.  Reports she placed benadryl ointment on the hand after the event and this helped with itching.  States that she woke up this morning with a swollen, red, hot and painful (see picture below).  She came in because she was having pain about the hand.  She denies lip, throat, and tongue swelling.   She is allergic to shellfish allergy; demerol; and sulfa antibiotics.   She  has a past medical history of Hypertension; Allergy; Anemia; Asthma; Cataract; and Palpitations.    She  reports that she has never smoked. She has never used smokeless tobacco. She  has no sexual activity history on file. The patient  has past surgical history that includes Appendectomy; Cholecystectomy; Eye surgery; Tubal ligation; and doppler echocardiography (2011).  Her family history includes Alzheimer's disease in her mother; Diabetes in her son; Heart attack in her father; Heart disease in her brother, brother, and brother; Sensorineural hearing loss in her sister; Thyroid disease in her brother.  Review of Systems  Constitutional: Negative for fever and chills.  Musculoskeletal: Positive for joint pain.  Skin: Positive for rash. Negative for itching.  Neurological: Negative for dizziness.    Problem list and medications reviewed and updated by myself where necessary, and exist elsewhere in the encounter.   OBJECTIVE:  BP 150/70 mmHg  Pulse 73  Temp(Src) 97.7 F (36.5 C) (Oral)  Resp 16  Ht 4' 9.5" (1.461 m)  Wt 86 lb (39.009 kg)  BMI 18.28 kg/m2  SpO2 98%  Physical Exam  Constitutional: She is oriented to person, place, and time. She appears well-nourished. No distress.  Eyes: EOM are normal. Pupils are equal, round, and reactive to light.  Cardiovascular: Normal rate.   Pulses:      Radial pulses are 2+ on the right side, and 2+  on the left side.  Pulmonary/Chest: Effort normal.  Abdominal: She exhibits no distension.  Neurological: She is alert and oriented to person, place, and time. She has normal strength. No cranial nerve deficit. Gait normal.  Skin: Skin is dry. She is not diaphoretic.  Psychiatric: She has a normal mood and affect.  Vitals reviewed.       No results found.  ASSESSMENT AND PLAN  Julie Durham was seen today for insect bite.  Diagnoses and all orders for this visit:  Cellulitis of right upper extremity: Given the pain, warmth and redness I will prescribe.  She is to return tomorrow if not improving.   -     cephALEXin (KEFLEX) 500 MG capsule; Take 1 capsule (500 mg total) by mouth 3 (three) times daily. Take with food.    The patient was advised to call or return to clinic if she does not see an improvement in symptoms, or to seek the care of the closest emergency department if she worsens with the above plan.   Deliah BostonMichael Clark, MHS, PA-C [facility] 07/16/2015 10:16 AM

## 2015-07-16 NOTE — Patient Instructions (Addendum)
Come back to clinic if you are not improving in 24 hours.     IF you received an x-ray today, you will receive an invoice from [facility]. Please contact [facility] at [phone] with questions or concerns regarding your invoice.   IF you received labwork today, you will receive an invoice from [facility]. Please contact [facility] at [phone] with questions or concerns regarding your invoice.   Our billing staff will not be able to assist you with questions regarding bills from these companies.  You will be contacted with the lab results as soon as they are available. The fastest way to get your results is to activate your My Chart account. Instructions are located on the last page of this paperwork. If you have not heard from us regarding the results in 2 weeks, please contact this office.

## 2015-08-13 DIAGNOSIS — H1133 Conjunctival hemorrhage, bilateral: Secondary | ICD-10-CM | POA: Diagnosis not present

## 2016-01-06 ENCOUNTER — Other Ambulatory Visit: Payer: Self-pay | Admitting: Cardiovascular Disease

## 2016-01-06 NOTE — Telephone Encounter (Signed)
REFILL 

## 2016-02-02 ENCOUNTER — Other Ambulatory Visit: Payer: Self-pay | Admitting: Cardiovascular Disease

## 2016-02-08 ENCOUNTER — Ambulatory Visit: Payer: Medicare Other | Admitting: Cardiovascular Disease

## 2016-02-29 ENCOUNTER — Ambulatory Visit (INDEPENDENT_AMBULATORY_CARE_PROVIDER_SITE_OTHER): Payer: Medicare Other | Admitting: Cardiovascular Disease

## 2016-02-29 ENCOUNTER — Encounter: Payer: Self-pay | Admitting: Cardiovascular Disease

## 2016-02-29 ENCOUNTER — Other Ambulatory Visit: Payer: Self-pay | Admitting: Cardiovascular Disease

## 2016-02-29 VITALS — BP 158/90 | HR 89 | Ht <= 58 in | Wt 90.0 lb

## 2016-02-29 DIAGNOSIS — Z79899 Other long term (current) drug therapy: Secondary | ICD-10-CM

## 2016-02-29 DIAGNOSIS — I10 Essential (primary) hypertension: Secondary | ICD-10-CM

## 2016-02-29 NOTE — Patient Instructions (Signed)

## 2016-02-29 NOTE — Progress Notes (Signed)
02/29/2016 Julie Durham   Jun 26, 1940  [id number]  Primary Physician Julie Sidle, MD Primary Cardiologist: Julie Gess MD Julie Durham  HPI:  The patient is a 76 year old, thin and fit appearing, Caucasian female mother of 2, grandmother to 2 grandchildren who has worked in [employer] department of [employer] for the last 23 years and she recently retired.. I saw her in the office 01/26/15. Her problems include mild hyperlipidemia, hypertension and a strong family history for heart disease. Her father died of an MI at age 21, and 2 brothers had MIs in their 6s and 71s, as well as a sister who had stents. She has never had a heart attack or stroke and denies chest pain or shortness of breath. She had a Myoview performed a year ago which was normal, and Dopplers involving her renal arteries, carotids and lower extremities all of which were normal as well. Since I saw her back a year ago she's had 4-5 episodes of brief tachypalpitations which either spontaneously resolve resolve with taking a when necessary metoprolol.. She has not taken her hydrochlorothiazide or low-dose amlodipine since August and feels currently better. Since I saw her last she has fallen down while on vacation in [place] injuring her hip, fracturing several ribs and her foot. These have all resolved in the last several months.   Current Outpatient Prescriptions  Medication Sig Dispense Refill  . aspirin EC 81 MG tablet Take 81 mg by mouth 2 (two) times daily.     Marland Kitchen atorvastatin (LIPITOR) 20 MG tablet TAKE 1 TABLET(20 MG) BY MOUTH DAILY 30 tablet 0  . BIOTIN PO Take 1 tablet by mouth daily.    . Cholecalciferol (VITAMIN D PO) Take 1 tablet by mouth daily.    Marland Kitchen loratadine (CLARITIN) 10 MG tablet Take 10 mg by mouth daily as needed for allergies.    Marland Kitchen MAGNESIUM PO Take 1 tablet by mouth daily.    . metoprolol tartrate (LOPRESSOR) 25 MG tablet TAKE 1/2 TABLET(12.5 MG) BY MOUTH TWICE DAILY 90 tablet  3  . Multiple Vitamins-Minerals (PRESERVISION AREDS 2) CAPS Take 1 capsule by mouth 2 (two) times daily.    . TURMERIC PO Take 1 tablet by mouth daily.    . valsartan (DIOVAN) 160 MG tablet Take 1 tablet (160 mg total) by mouth daily. 90 tablet 3   No current facility-administered medications for this visit.     Allergies  Allergen Reactions  . Shellfish Allergy Anaphylaxis  . Demerol   . Sulfa Antibiotics     Social History   Social History  . Marital status: Legally Separated    Spouse name: N/A  . Number of children: N/A  . Years of education: N/A   Occupational History  . Not on file.   Social History Main Topics  . Smoking status: Never Smoker  . Smokeless tobacco: Never Used  . Alcohol use Not on file  . Drug use: Unknown  . Sexual activity: Not on file   Other Topics Concern  . Not on file   Social History Narrative  . No narrative on file     Review of Systems: General: negative for chills, fever, night sweats or weight changes.  Cardiovascular: negative for chest pain, dyspnea on exertion, edema, orthopnea, palpitations, paroxysmal nocturnal dyspnea or shortness of breath Dermatological: negative for rash Respiratory: negative for cough or wheezing Urologic: negative for hematuria Abdominal: negative for nausea, vomiting, diarrhea, bright red blood per rectum, melena,  or hematemesis Neurologic: negative for visual changes, syncope, or dizziness All other systems reviewed and are otherwise negative except as noted above.    Blood pressure (!) 158/90, pulse 89, height 4\' 9"  (1.448 m), weight 90 lb (40.8 kg).  General appearance: alert and no distress Neck: no adenopathy, no carotid bruit, no JVD, supple, symmetrical, trachea midline and thyroid not enlarged, symmetric, no tenderness/mass/nodules Lungs: clear to auscultation bilaterally Heart: regular rate and rhythm, S1, S2 normal, no murmur, click, rub or gallop Extremities: extremities normal,  atraumatic, no cyanosis or edema  EKG sinus rhythm at 89 without ST or T-wave changes. I personally reviewed this EKG  ASSESSMENT AND PLAN:   Essential hypertension History of hypertension blood pressure measured at 158/90. She is on Lopressor and valsartan. Current meds at current dose      Julie GessJonathan J. Emon Miggins MD [facility]FACP,FACC,FAHA, [facility] 02/29/2016 9:49 AM

## 2016-02-29 NOTE — Assessment & Plan Note (Signed)
History of hypertension blood pressure measured at 158/90. She is on Lopressor and valsartan. Current meds at current dose

## 2016-03-01 DIAGNOSIS — Z79899 Other long term (current) drug therapy: Secondary | ICD-10-CM | POA: Diagnosis not present

## 2016-03-01 LAB — LIPID PANEL
Cholesterol: 122 mg/dL (ref <200)
HDL: 46 mg/dL — ABNORMAL LOW (ref >50)
LDL Cholesterol: 56 mg/dL (ref <100)
Total CHOL/HDL Ratio: 2.7 Ratio (ref <5.0)
Triglycerides: 98 mg/dL (ref <150)
VLDL: 20 mg/dL (ref <30)

## 2016-03-01 LAB — HEPATIC FUNCTION PANEL
ALT: 17 U/L (ref 6–29)
AST: 18 U/L (ref 10–35)
Albumin: 3.9 g/dL (ref 3.6–5.1)
Alkaline Phosphatase: 148 U/L — ABNORMAL HIGH (ref 33–130)
Bilirubin, Direct: 0.1 mg/dL (ref <=0.2)
Indirect Bilirubin: 0.3 mg/dL (ref 0.2–1.2)
Total Bilirubin: 0.4 mg/dL (ref 0.2–1.2)
Total Protein: 6.5 g/dL (ref 6.1–8.1)

## 2016-03-01 NOTE — Telephone Encounter (Signed)
Rx request sent to pharmacy.  

## 2016-03-02 ENCOUNTER — Encounter: Payer: Self-pay | Admitting: Cardiovascular Disease

## 2016-03-09 ENCOUNTER — Telehealth: Payer: Self-pay | Admitting: Cardiovascular Disease

## 2016-03-09 NOTE — Telephone Encounter (Signed)
Returned call to patient-made aware of results. Patient aware and verbalized understanding.  Notes Recorded by Runell GessJonathan J Berry, MD on 03/01/2016 at 1:18 PM EST Markedly improved test and lipid profile compared to one year ago. Excellent!

## 2016-03-09 NOTE — Telephone Encounter (Signed)
New message    Pt is calling for blood work results from last week.

## 2016-03-15 ENCOUNTER — Other Ambulatory Visit: Payer: Self-pay | Admitting: Cardiovascular Disease

## 2016-03-16 NOTE — Telephone Encounter (Signed)
Rx(s) sent to pharmacy electronically.  

## 2016-04-24 ENCOUNTER — Ambulatory Visit (INDEPENDENT_AMBULATORY_CARE_PROVIDER_SITE_OTHER): Payer: Medicare Other | Admitting: Urgent Care

## 2016-04-24 VITALS — BP 162/84 | HR 78 | Temp 98.0°F | Resp 17 | Ht <= 58 in | Wt 91.0 lb

## 2016-04-24 DIAGNOSIS — M79642 Pain in left hand: Secondary | ICD-10-CM | POA: Diagnosis not present

## 2016-04-24 DIAGNOSIS — R030 Elevated blood-pressure reading, without diagnosis of hypertension: Secondary | ICD-10-CM

## 2016-04-24 DIAGNOSIS — T63461A Toxic effect of venom of wasps, accidental (unintentional), initial encounter: Secondary | ICD-10-CM

## 2016-04-24 DIAGNOSIS — I10 Essential (primary) hypertension: Secondary | ICD-10-CM | POA: Diagnosis not present

## 2016-04-24 DIAGNOSIS — R2232 Localized swelling, mass and lump, left upper limb: Secondary | ICD-10-CM | POA: Diagnosis not present

## 2016-04-24 NOTE — Patient Instructions (Addendum)
Please schedule Tylenol 510m every 6 hours. Take this with benadryl 263mevery 6 hours. If you develop fever, redness, worsening hand swelling do not wait until tomorrow to come back for a recheck. Report to the ER if it is past 5:00pm or you can come back here if it is before then. We will re-evaluate you tomorrow otherwise and decide if you need more treatment.    Bee, Wasp, or [employer] Cowasps, and hornets are part of a family of insects that can sting people. These stings can cause pain and inflammation, but they are usually not serious. However, some people may have an allergic reaction to a sting. This can cause the symptoms to be more severe.  SYMPTOMS  Common symptoms of this condition include:   A red lump in the skin that sometimes has a tiny hole in the center. In some cases, a stinger may be in the center of the wound.  Pain and itching at the sting site.  Redness and swelling around the sting site. If you have an allergic reaction (localized allergic reaction), the swelling and redness may spread out from the sting site. In some cases, this reaction can continue to develop over the next 12-36 hours. In rare cases, a person may have a severe allergic reaction (anaphylactic reaction) to a sting. Symptoms of an anaphylactic reaction may include:   Wheezing or difficulty breathing.  Raised, itchy, red patches on the skin.  Nausea or vomiting.  Abdominal cramping.  Diarrhea.  Chest pain.  Fainting.  Redness of the face (flushing). DIAGNOSIS  This condition is usually diagnosed based on symptoms, medical history, and a physical exam. TREATMENT  Most stings can be treated with:   Icing to reduce swelling.  Medicines (antihistamines) to treat itching or an allergic reaction.  Medicines to help reduce pain. These may be medicines that you take by mouth, or medicated creams or lotions that you apply to your skin. If you were stung by a bee, the stinger and a small  sac of poison may be in the wound. This may be removed by brushing across it with a flat card, such as a credit card. Another method is to pinch the area and pull it out. These methods can help reduce the severity of the body's reaction to the sting.  HOME CARE INSTRUCTIONS   Wash the sting site daily with soap and water as told by your health care provider.  Apply or take over-the-counter and prescription medicines only as told by your health care provider.  If directed, apply ice to the sting area.  Put ice in a plastic bag.  Place a towel between your skin and the bag.  Leave the ice on for 20 minutes, 2-3 times per day.  Do not scratch the sting area.  To lessen pain, try using a paste that is made of water and baking soda. Rub the paste on the sting area and leave it on for 5 minutes.  If you had a severe allergic reaction to a sting, you may need:  To wear a medical bracelet or necklace that lists the allergy.  To learn when and how to use an anaphylaxis kit or epinephrine injection. Your family members may also need to learn this.  To carry an anaphylaxis kit with you at all times. SEEK MEDICAL CARE IF:   Your symptoms do not get better in 2-3 days.  You have redness, swelling, or pain that spreads beyond the area of the  sting.  You have a fever. SEEK IMMEDIATE MEDICAL CARE IF:  You have symptoms of a severe allergic reaction. These include:   Wheezing or difficulty breathing.  Chest pain.  Light-headedness or fainting.  Itchy, raised, red patches on the skin.  Nausea or vomiting.  Abdominal cramping.  Diarrhea. This information is not intended to replace advice given to you by your health care provider. Make sure you discuss any questions you have with your health care provider. Document Released: 12/26/2004 Document Revised: 09/16/2014 Document Reviewed: 05/13/2014 Elsevier Interactive Patient Education  2017 [employer].     IF you received an  x-ray today, you will receive an invoice from [facility]. Please contact [facility] at [phone] with questions or concerns regarding your invoice.   IF you received labwork today, you will receive an invoice from [facility]. Please contact [facility] at [phone] with questions or concerns regarding your invoice.   Our billing staff will not be able to assist you with questions regarding bills from these companies.  You will be contacted with the lab results as soon as they are available. The fastest way to get your results is to activate your My Chart account. Instructions are located on the last page of this paperwork. If you have not heard from [place] regarding the results in 2 weeks, please contact this office.

## 2016-04-24 NOTE — Progress Notes (Signed)
  MRN: [id number] DOB: 1940/12/17  Subjective:   Julie Durham is a 76 y.o. female presenting for chief complaint of Insect Bite (This am at 10:00/ left hand)  Reports suffering a wasp sting to her left hand today. She cleaned her hand with soap and water. However, she is very concerned that the stinger is still there, has a foreign body sensation. Started taking benadryl. Admits history of severe allergic reaction to wasp sting. Denies fever, chest tightness, facial swelling, tongue swelling, shob, n/v, abdominal pain.   Julie Durham has a current medication list which includes the following prescription(s): aspirin ec, atorvastatin, biotin, cholecalciferol, loratadine, magnesium, metoprolol tartrate, preservision areds 2, turmeric, and valsartan. Also is allergic to shellfish allergy; demerol; and sulfa antibiotics.  Julie Durham  has a past medical history of Allergy; Anemia; Asthma; Cataract; Hypertension; and Palpitations. Also  has a past surgical history that includes Appendectomy; Cholecystectomy; Eye surgery; Tubal ligation; and doppler echocardiography (2011).  Objective:   Vitals: BP (!) 160/82 (BP Location: Left Arm, Patient Position: Sitting, Cuff Size: Normal)   Pulse 78   Temp 98 F (36.7 C) (Oral)   Resp 17   Ht  (1.448 m)   Wt 91 lb (41.3 kg)   SpO2 96%   BMI 19.69 kg/m   Physical Exam  Constitutional: She is oriented to person, place, and time. She appears well-developed and well-nourished.  HENT:  Mouth/Throat: Oropharynx is clear and moist.  Cardiovascular: Normal rate, regular rhythm and intact distal pulses.  Exam reveals no gallop and no friction rub.   No murmur heard. Pulmonary/Chest: No respiratory distress. She has no wheezes. She has no rales.  Neurological: She is alert and oriented to person, place, and time. Coordination normal.  Skin: Skin is warm and dry. Capillary refill takes less than 2 seconds.  Psychiatric: She has a normal mood and affect.    PROCEDURE - Verbal consent obtained. Alcohol prep pad used, 1cc of 2% lidocaine injected for local anaesthesia. An 18g needle was used for foreign body removal over site of wound. No foreign body was removed. Hemostasis achieved with pressure. Cleansed and dressed.  Assessment and Plan :   1. Wasp sting, accidental or unintentional, initial encounter 2. Left hand pain 3. Localized swelling on left hand - Will manage as contact dermatitis, localized allergic reaction to wasp sting. Return-to-clinic precautions discussed, patient verbalized understanding. Consider antibiotic course if signs and symptoms of infection develop.  4. Essential hypertension 5. Elevated blood pressure reading - Monitor, maintain blood pressure medication regimen.  Wallis Bamberg, PA-C Primary Care at [facility] [phone] 04/24/2016  11:25 AM

## 2016-04-25 ENCOUNTER — Ambulatory Visit (INDEPENDENT_AMBULATORY_CARE_PROVIDER_SITE_OTHER): Payer: Medicare Other | Admitting: Physician Assistant

## 2016-04-25 VITALS — BP 162/84 | HR 70 | Temp 97.7°F | Resp 16 | Ht <= 58 in | Wt 91.0 lb

## 2016-04-25 DIAGNOSIS — R030 Elevated blood-pressure reading, without diagnosis of hypertension: Secondary | ICD-10-CM | POA: Diagnosis not present

## 2016-04-25 DIAGNOSIS — M79642 Pain in left hand: Secondary | ICD-10-CM | POA: Diagnosis not present

## 2016-04-25 DIAGNOSIS — M7989 Other specified soft tissue disorders: Secondary | ICD-10-CM

## 2016-04-25 DIAGNOSIS — T63461A Toxic effect of venom of wasps, accidental (unintentional), initial encounter: Secondary | ICD-10-CM

## 2016-04-25 MED ORDER — CLOBETASOL PROPIONATE 0.05 % EX CREA: 1.0000 "application " | TOPICAL_CREAM | Freq: Two times a day (BID) | CUTANEOUS | 0 refills | Status: DC

## 2016-04-25 NOTE — Progress Notes (Signed)
PRIMARY CARE AT [facility] [address] [place] [address] [phone]  Date:  04/25/2016   Name:  Julie Durham   DOB:  1940/11/07   MRN:  [id number]  PCP:  No PCP Per Patient    History of Present Illness:  Julie Durham is a 76 y.o. female patient who presents to PCP with  Chief Complaint  Patient presents with  . Follow-up    bee sting     --patient is following up of hand insect bite and swelling.  Patient was seen by [facility] yesterday after a wasp bite.  Advised tylenol and benadryl q6 hours.  She states that it is doing ok.  She noticed increased hand swelling this morning when she woke up, however she applied ice and it quickly dissipated.  She has no numbness or tingling, red streaking, nausea, or fever.  Denies drowsiness to the benadryl.   No sob or dyspnea  Currently taking both anti-hypertensives.  Generally is much lower, she reports.  Patient Active Problem List   Diagnosis Date Noted  . Essential hypertension 11/18/2012  . Palpitations 11/18/2012    Past Medical History:  Diagnosis Date  . Allergy   . Anemia   . Asthma   . Cataract   . Hypertension   . Palpitations     Past Surgical History:  Procedure Laterality Date  . APPENDECTOMY    . CHOLECYSTECTOMY    . DOPPLER ECHOCARDIOGRAPHY  2011  . EYE SURGERY    . TUBAL LIGATION      Social History  Substance Use Topics  . Smoking status: Never Smoker  . Smokeless tobacco: Never Used  . Alcohol use Not on file    Family History  Problem Relation Age of Onset  . Alzheimer's disease Mother   . Heart attack Father   . Heart disease Brother   . Diabetes Son   . Sensorineural hearing loss Sister   . Heart disease Brother   . Heart disease Brother   . Thyroid disease Brother     Allergies  Allergen Reactions  . Shellfish Allergy Anaphylaxis  . Demerol   . Sulfa Antibiotics     Medication list has been reviewed and updated.  Current Outpatient Prescriptions on File Prior to Visit   Medication Sig Dispense Refill  . aspirin EC 81 MG tablet Take 81 mg by mouth 2 (two) times daily.     Marland Kitchen atorvastatin (LIPITOR) 20 MG tablet TAKE 1 TABLET(20 MG) BY MOUTH DAILY 30 tablet 11  . BIOTIN PO Take 1 tablet by mouth daily.    . Cholecalciferol (VITAMIN D PO) Take 1 tablet by mouth daily.    Marland Kitchen loratadine (CLARITIN) 10 MG tablet Take 10 mg by mouth daily as needed for allergies.    Marland Kitchen MAGNESIUM PO Take 1 tablet by mouth daily.    . metoprolol tartrate (LOPRESSOR) 25 MG tablet TAKE 1/2 TABLET(12.5 MG) BY MOUTH TWICE DAILY 90 tablet 3  . Multiple Vitamins-Minerals (PRESERVISION AREDS 2) CAPS Take 1 capsule by mouth 2 (two) times daily.    . TURMERIC PO Take 1 tablet by mouth daily.    . valsartan (DIOVAN) 160 MG tablet TAKE 1 TABLET(160 MG) BY MOUTH DAILY 30 tablet 10   No current facility-administered medications on file prior to visit.     ROS ROS otherwise unremarkable unless listed above.  Physical Examination: BP (!) 162/84 (BP Location: Right Arm, Patient Position: Sitting, Cuff Size: Normal)   Pulse 70  Temp 97.7 F (36.5 C) (Oral)   Resp 16   Ht  (1.448 m)   Wt 91 lb (41.3 kg)   SpO2 95%   BMI 19.69 kg/m  Ideal Body Weight: Weight in (lb) to have BMI = 25: 115.3  Physical Exam  Constitutional: She is oriented to person, place, and time. She appears well-developed and well-nourished. No distress.  HENT:  Head: Normocephalic and atraumatic.  Right Ear: External ear normal.  Left Ear: External ear normal.  Eyes: Conjunctivae and EOM are normal. Pupils are equal, round, and reactive to light.  Cardiovascular: Normal rate.   Pulses:      Radial pulses are 2+ on the right side, and 2+ on the left side.  Pulmonary/Chest: Effort normal. No respiratory distress.  Musculoskeletal:  Left hand swelling at the 5th metacarpal near punctured site.  Sanguinous appearing papule present without drianage.  Swelling mildly spreads at the dorsum for 5th to 3rd.  Normal rom.   No pain upon palpation.  Strength appears baseline with arthritic deformities.    Neurological: She is alert and oriented to person, place, and time.  Skin: She is not diaphoretic.  Psychiatric: She has a normal mood and affect. Her behavior is normal.     Assessment and Plan: [name] is a 76 y.o. female who is here today for follow up of the wasp sting and hand swelling. This appears as a local reaction.  No drowsiness with the benadryl.  She can continue this with the tylenol.  Given topical clobetasol for short term use as discussed.  Advised elevation and cold compresses.  She voiced understanding.  rtc if bp does not reach below 140/90, after 2 weeks rtc as needed.  Hand pain, left - Plan: clobetasol cream (TEMOVATE) 0.05 %  Wasp sting, accidental or unintentional, initial encounter - Plan: clobetasol cream (TEMOVATE) 0.05 %  Swelling of left hand  Trena Platt, PA-C [facility] 4/17/20181:51 PM

## 2016-04-25 NOTE — Patient Instructions (Addendum)
I want you to continue the tylenol and the benadryl that [facility] had instructed yesterday.  Continue to do the ice compressions. You can also elevate the hand over head.   Return tomorrow if your symptoms are not improving.   You can apply the hand cream around the site, but not on the open wound.     IF you received an x-ray today, you will receive an invoice from [facility]. Please contact [facility] at [phone] with questions or concerns regarding your invoice.   IF you received labwork today, you will receive an invoice from [place]. Please contact [facility] at [phone] with questions or concerns regarding your invoice.   Our billing staff will not be able to assist you with questions regarding bills from these companies.  You will be contacted with the lab results as soon as they are available. The fastest way to get your results is to activate your My Chart account. Instructions are located on the last page of this paperwork. If you have not heard from [place] regarding the results in 2 weeks, please contact this office.

## 2016-05-11 ENCOUNTER — Other Ambulatory Visit: Payer: Self-pay | Admitting: Cardiovascular Disease

## 2016-05-29 DIAGNOSIS — H353112 Nonexudative age-related macular degeneration, right eye, intermediate dry stage: Secondary | ICD-10-CM | POA: Diagnosis not present

## 2016-05-30 DIAGNOSIS — H353221 Exudative age-related macular degeneration, left eye, with active choroidal neovascularization: Secondary | ICD-10-CM | POA: Diagnosis not present

## 2016-05-30 DIAGNOSIS — H43813 Vitreous degeneration, bilateral: Secondary | ICD-10-CM | POA: Diagnosis not present

## 2016-05-30 DIAGNOSIS — H353112 Nonexudative age-related macular degeneration, right eye, intermediate dry stage: Secondary | ICD-10-CM | POA: Diagnosis not present

## 2016-05-30 DIAGNOSIS — H35033 Hypertensive retinopathy, bilateral: Secondary | ICD-10-CM | POA: Diagnosis not present

## 2016-06-27 DIAGNOSIS — H353112 Nonexudative age-related macular degeneration, right eye, intermediate dry stage: Secondary | ICD-10-CM | POA: Diagnosis not present

## 2016-06-27 DIAGNOSIS — H43813 Vitreous degeneration, bilateral: Secondary | ICD-10-CM | POA: Diagnosis not present

## 2016-06-27 DIAGNOSIS — H353221 Exudative age-related macular degeneration, left eye, with active choroidal neovascularization: Secondary | ICD-10-CM | POA: Diagnosis not present

## 2016-06-27 DIAGNOSIS — H35033 Hypertensive retinopathy, bilateral: Secondary | ICD-10-CM | POA: Diagnosis not present

## 2016-06-29 ENCOUNTER — Ambulatory Visit (INDEPENDENT_AMBULATORY_CARE_PROVIDER_SITE_OTHER): Payer: Medicare Other | Admitting: Emergency Medicine

## 2016-06-29 ENCOUNTER — Encounter: Payer: Self-pay | Admitting: Emergency Medicine

## 2016-06-29 VITALS — BP 177/73 | HR 68 | Temp 98.6°F | Resp 16 | Ht <= 58 in | Wt 88.2 lb

## 2016-06-29 DIAGNOSIS — M795 Residual foreign body in soft tissue: Secondary | ICD-10-CM

## 2016-06-29 DIAGNOSIS — W57XXXA Bitten or stung by nonvenomous insect and other nonvenomous arthropods, initial encounter: Secondary | ICD-10-CM | POA: Diagnosis not present

## 2016-06-29 MED ORDER — DOXYCYCLINE HYCLATE 100 MG PO TABS: 100.0000 mg | ORAL_TABLET | Freq: Two times a day (BID) | ORAL | 1 refills | Status: DC

## 2016-06-29 NOTE — Patient Instructions (Addendum)
   IF you received an x-ray today, you will receive an invoice from [facility]. Please contact [facility] at [phone] with questions or concerns regarding your invoice.   IF you received labwork today, you will receive an invoice from [facility]. Please contact [facility] at [phone] with questions or concerns regarding your invoice.   Our billing staff will not be able to assist you with questions regarding bills from these companies.  You will be contacted with the lab results as soon as they are available. The fastest way to get your results is to activate your My Chart account. Instructions are located on the last page of this paperwork. If you have not heard from us regarding the results in 2 weeks, please contact this office.     Wound Care, Adult Taking care of your wound properly can help to prevent pain and infection. It can also help your wound to heal more quickly. How is this treated? Wound care  Follow instructions from your health care provider about how to take care of your wound. Make sure you: ? Wash your hands with soap and water before you change the bandage (dressing). If soap and water are not available, use hand sanitizer. ? Change your dressing as told by your health care provider. ? Leave stitches (sutures), skin glue, or adhesive strips in place. These skin closures may need to stay in place for 2 weeks or longer. If adhesive strip edges start to loosen and curl up, you may trim the loose edges. Do not remove adhesive strips completely unless your health care provider tells you to do that.  Check your wound area every day for signs of infection. Check for: ? More redness, swelling, or pain. ? More fluid or blood. ? Warmth. ? Pus or a bad smell.  Ask your health care provider if you should clean the wound with mild soap and water. Doing this may include: ? Using a clean towel to pat the wound dry after cleaning it. Do not rub or scrub  the wound. ? Applying a cream or ointment. Do this only as told by your health care provider. ? Covering the incision with a clean dressing.  Ask your health care provider when you can leave the wound uncovered. Medicines   If you were prescribed an antibiotic medicine, cream, or ointment, take or use the antibiotic as told by your health care provider. Do not stop taking or using the antibiotic even if your condition improves.  Take over-the-counter and prescription medicines only as told by your health care provider. If you were prescribed pain medicine, take it at least 30 minutes before doing any wound care or as told by your health care provider. General instructions  Return to your normal activities as told by your health care provider. Ask your health care provider what activities are safe.  Do not scratch or pick at the wound.  Keep all follow-up visits as told by your health care provider. This is important.  Eat a diet that includes protein, vitamin A, vitamin C, and other nutrient-rich foods. These help the wound heal: ? Protein-rich foods include meat, dairy, beans, nuts, and other sources. ? Vitamin A-rich foods include carrots and dark green, leafy vegetables. ? Vitamin C-rich foods include citrus, tomatoes, and other fruits and vegetables. ? Nutrient-rich foods have protein, carbohydrates, fat, vitamins, or minerals. Eat a variety of healthy foods including vegetables, fruits, and whole grains. Contact a health care provider if:  You received a   tetanus shot and you have swelling, severe pain, redness, or bleeding at the injection site.  Your pain is not controlled with medicine.  You have more redness, swelling, or pain around the wound.  You have more fluid or blood coming from the wound.  Your wound feels warm to the touch.  You have pus or a bad smell coming from the wound.  You have a fever or chills.  You are nauseous or you vomit.  You are dizzy. Get  help right away if:  You have a red streak going away from your wound.  The edges of the wound open up and separate.  Your wound is bleeding and the bleeding does not stop with gentle pressure.  You have a rash.  You faint.  You have trouble breathing. This information is not intended to replace advice given to you by your health care provider. Make sure you discuss any questions you have with your health care provider. Document Released: 10/05/2007 Document Revised: 08/25/2015 Document Reviewed: 07/13/2015 Elsevier Interactive Patient Education  2017 [employer]  

## 2016-06-29 NOTE — Progress Notes (Signed)
Julie Durham 76 y.o.   Chief Complaint  Patient presents with  . Insect Bite    Wasp sting on left hand in april - spot on hand red/hard     HISTORY OF PRESENT ILLNESS: This is a 76 y.o. female stung by wasp last April; got better but now c/o red and tender spot where she got bit; suspecting retained FB.  HPI   Prior to Admission medications   Medication Sig Start Date End Date Taking? Authorizing Provider  aspirin EC 81 MG tablet Take 81 mg by mouth 2 (two) times daily.    Yes [provider]  atorvastatin (LIPITOR) 20 MG tablet TAKE 1 TABLET(20 MG) BY MOUTH DAILY 03/01/16  Yes Runell Gess, MD  BIOTIN PO Take 1 tablet by mouth daily.   Yes [provider]  Cholecalciferol (VITAMIN D PO) Take 1 tablet by mouth daily.   Yes [provider]  loratadine (CLARITIN) 10 MG tablet Take 10 mg by mouth daily as needed for allergies.   Yes [provider]  MAGNESIUM PO Take 1 tablet by mouth daily.   Yes [provider]  metoprolol tartrate (LOPRESSOR) 25 MG tablet TAKE 1/2 TABLET(12.5 MG) BY MOUTH TWICE DAILY 05/11/16  Yes Runell Gess, MD  Multiple Vitamins-Minerals (PRESERVISION AREDS 2) CAPS Take 1 capsule by mouth 2 (two) times daily.   Yes [provider]  TURMERIC PO Take 1 tablet by mouth daily.   Yes [provider]  valsartan (DIOVAN) 160 MG tablet TAKE 1 TABLET(160 MG) BY MOUTH DAILY 03/16/16  Yes Runell Gess, MD  clobetasol cream (TEMOVATE) 0.05 % Apply 1 application topically 2 (two) times daily. Patient not taking: Reported on 06/29/2016 04/25/16   Trena Platt D, PA  doxycycline (VIBRA-TABS) 100 MG tablet Take 1 tablet (100 mg total) by mouth 2 (two) times daily. 06/29/16 07/06/16  Georgina Quint, MD    Allergies  Allergen Reactions  . Shellfish Allergy Anaphylaxis  . Demerol   . Sulfa Antibiotics     Patient Active Problem List   Diagnosis Date Noted  . Infected insect bite  06/29/2016  . Retained foreign body in soft tissue 06/29/2016  . Essential hypertension 11/18/2012  . Palpitations 11/18/2012    Past Medical History:  Diagnosis Date  . Allergy   . Anemia   . Asthma   . Cataract   . Hypertension   . Palpitations     Past Surgical History:  Procedure Laterality Date  . APPENDECTOMY    . CHOLECYSTECTOMY    . DOPPLER ECHOCARDIOGRAPHY  2011  . EYE SURGERY    . TUBAL LIGATION      Social History   Social History  . Marital status: Legally Separated    Spouse name: N/A  . Number of children: N/A  . Years of education: N/A   Occupational History  . Not on file.   Social History Main Topics  . Smoking status: Never Smoker  . Smokeless tobacco: Never Used  . Alcohol use Not on file  . Drug use: Unknown  . Sexual activity: Not on file   Other Topics Concern  . Not on file   Social History Narrative  . No narrative on file    Family History  Problem Relation Age of Onset  . Alzheimer's disease Mother   . Heart attack Father   . Heart disease Brother   . Diabetes Son   . Sensorineural hearing loss Sister   .  Heart disease Brother   . Heart disease Brother   . Thyroid disease Brother      Review of Systems  Constitutional: Negative for chills and fever.  HENT: Negative.   Eyes: Negative.   Respiratory: Negative.  Negative for shortness of breath.   Cardiovascular: Negative.  Negative for chest pain.  Gastrointestinal: Negative.  Negative for nausea and vomiting.  Neurological: Negative.  Negative for dizziness and headaches.  Endo/Heme/Allergies: Negative.   All other systems reviewed and are negative.  Vitals:   06/29/16 1019  BP: (!) 177/73  Pulse: 68  Resp: 16  Temp: 98.6 F (37 C)     Physical Exam  Constitutional: She is oriented to person, place, and time. She appears well-developed and well-nourished.  HENT:  Head: Normocephalic and atraumatic.  Eyes: EOM are normal. Pupils are equal, round, and  reactive to light.  Neck: Normal range of motion.  Cardiovascular: Normal rate.   Pulmonary/Chest: Effort normal.  Musculoskeletal:  Left hand: ulnar palmar side lateral aspect shows red spot suspicious for FB reaction.  Neurological: She is alert and oriented to person, place, and time.  Skin: Skin is warm and dry. Capillary refill takes less than 2 seconds.  Psychiatric: She has a normal mood and affect. Her behavior is normal.  Vitals reviewed.  Area injected with 1 cc Lidocaine, opened, and explored; small amount of debri removed with forceps; irrigated and Bactroban and Bandaid applied.   ASSESSMENT & PLAN: Larina was seen today for insect bite.  Diagnoses and all orders for this visit:  Retained foreign body in soft tissue  Bug bite with infection, initial encounter  Other orders -     doxycycline (VIBRA-TABS) 100 MG tablet; Take 1 tablet (100 mg total) by mouth 2 (two) times daily.    Patient Instructions       IF you received an x-ray today, you will receive an invoice from [facility]. Please contact [facility] at [phone] with questions or concerns regarding your invoice.   IF you received labwork today, you will receive an invoice from [facility]. Please contact [facility] at [phone] with questions or concerns regarding your invoice.   Our billing staff will not be able to assist you with questions regarding bills from these companies.  You will be contacted with the lab results as soon as they are available. The fastest way to get your results is to activate your My Chart account. Instructions are located on the last page of this paperwork. If you have not heard from [place] regarding the results in 2 weeks, please contact this office.      Wound Care, Adult Taking care of your wound properly can help to prevent pain and infection. It can also help your wound to heal more quickly. How is this treated? Wound care  Follow instructions  from your health care provider about how to take care of your wound. Make sure you: ? Wash your hands with soap and water before you change the bandage (dressing). If soap and water are not available, use hand sanitizer. ? Change your dressing as told by your health care provider. ? Leave stitches (sutures), skin glue, or adhesive strips in place. These skin closures may need to stay in place for 2 weeks or longer. If adhesive strip edges start to loosen and curl up, you may trim the loose edges. Do not remove adhesive strips completely unless your health care provider tells you to do that.  Check your wound area every  day for signs of infection. Check for: ? More redness, swelling, or pain. ? More fluid or blood. ? Warmth. ? Pus or a bad smell.  Ask your health care provider if you should clean the wound with mild soap and water. Doing this may include: ? Using a clean towel to pat the wound dry after cleaning it. Do not rub or scrub the wound. ? Applying a cream or ointment. Do this only as told by your health care provider. ? Covering the incision with a clean dressing.  Ask your health care provider when you can leave the wound uncovered. Medicines   If you were prescribed an antibiotic medicine, cream, or ointment, take or use the antibiotic as told by your health care provider. Do not stop taking or using the antibiotic even if your condition improves.  Take over-the-counter and prescription medicines only as told by your health care provider. If you were prescribed pain medicine, take it at least 30 minutes before doing any wound care or as told by your health care provider. General instructions  Return to your normal activities as told by your health care provider. Ask your health care provider what activities are safe.  Do not scratch or pick at the wound.  Keep all follow-up visits as told by your health care provider. This is important.  Eat a diet that includes protein,  vitamin A, vitamin C, and other nutrient-rich foods. These help the wound heal: ? Protein-rich foods include meat, dairy, beans, nuts, and other sources. ? Vitamin A-rich foods include carrots and dark green, leafy vegetables. ? Vitamin C-rich foods include citrus, tomatoes, and other fruits and vegetables. ? Nutrient-rich foods have protein, carbohydrates, fat, vitamins, or minerals. Eat a variety of healthy foods including vegetables, fruits, and whole grains. Contact a health care provider if:  You received a tetanus shot and you have swelling, severe pain, redness, or bleeding at the injection site.  Your pain is not controlled with medicine.  You have more redness, swelling, or pain around the wound.  You have more fluid or blood coming from the wound.  Your wound feels warm to the touch.  You have pus or a bad smell coming from the wound.  You have a fever or chills.  You are nauseous or you vomit.  You are dizzy. Get help right away if:  You have a red streak going away from your wound.  The edges of the wound open up and separate.  Your wound is bleeding and the bleeding does not stop with gentle pressure.  You have a rash.  You faint.  You have trouble breathing. This information is not intended to replace advice given to you by your health care provider. Make sure you discuss any questions you have with your health care provider. Document Released: 10/05/2007 Document Revised: 08/25/2015 Document Reviewed: 07/13/2015 Elsevier Interactive Patient Education  2017 Elsevier Inc.      Edwina BarthMiguel Sofya Moustafa, MD [facility]

## 2016-07-01 ENCOUNTER — Encounter: Payer: Self-pay | Admitting: Family Medicine

## 2016-07-01 ENCOUNTER — Ambulatory Visit (INDEPENDENT_AMBULATORY_CARE_PROVIDER_SITE_OTHER): Payer: Medicare Other | Admitting: Family Medicine

## 2016-07-01 VITALS — BP 192/90 | HR 68 | Resp 18 | Ht <= 58 in | Wt 87.4 lb

## 2016-07-01 DIAGNOSIS — T63461S Toxic effect of venom of wasps, accidental (unintentional), sequela: Secondary | ICD-10-CM | POA: Diagnosis not present

## 2016-07-01 DIAGNOSIS — R0789 Other chest pain: Secondary | ICD-10-CM | POA: Diagnosis not present

## 2016-07-01 DIAGNOSIS — R0602 Shortness of breath: Secondary | ICD-10-CM

## 2016-07-01 MED ORDER — DIPHENHYDRAMINE HCL 50 MG/ML IJ SOLN
12.5000 mg | Freq: Once | INTRAMUSCULAR | Status: AC
  Administered 2016-07-01: 12.5 mg via INTRAMUSCULAR

## 2016-07-01 MED ORDER — VALSARTAN 320 MG PO TABS: 320.0000 mg | ORAL_TABLET | Freq: Every day | ORAL | 1 refills | Status: DC

## 2016-07-01 MED ORDER — METHYLPREDNISOLONE SODIUM SUCC 125 MG IJ SOLR
125.0000 mg | Freq: Once | INTRAMUSCULAR | Status: AC
  Administered 2016-07-01: 125 mg via INTRAMUSCULAR

## 2016-07-01 NOTE — Patient Instructions (Addendum)
  Take valsartan 160mg  two tablets daily until you finish your current prescription Then start valsartan 320mg  from the pharmacy   It is okay to crush this pill because pills can cause esophagitis   IF you received an x-ray today, you will receive an invoice from [facility]. Please contact [facility] at [phone] with questions or concerns regarding your invoice.   IF you received labwork today, you will receive an invoice from [facility]. Please contact [facility] at [phone] with questions or concerns regarding your invoice.   Our billing staff will not be able to assist you with questions regarding bills from these companies.  You will be contacted with the lab results as soon as they are available. The fastest way to get your results is to activate your My Chart account. Instructions are located on the last page of this paperwork. If you have not heard from us regarding the results in 2 weeks, please contact this office.

## 2016-07-01 NOTE — Progress Notes (Signed)
Chief Complaint  Patient presents with  . Shortness of Breath    HPI   Patient reports that this morning she was taking her medication and felt like she could hardly swallow She reports that she felt like her throat was swelling up on her She reports that it is hard to get a deep breath She was here on 06/29/2016 for removal of a wasp stinger and was worried that she might be having difficulty swallowing her pills She swallowed her breakfast just fine She ate pancakes and sausage  She reports that right now she still has the same sensation She states that it feels like she is swallowing sand with some pressure up to the ears No chest pains  No palpitations No shortness of breath She reports that she just went to the bathroom to throw up    BP Readings from Last 3 Encounters:  07/01/16 (!) 192/90  06/29/16 (!) 177/73  04/25/16 (!) 162/84    Past Medical History:  Diagnosis Date  . Allergy   . Anemia   . Asthma   . Cataract   . Hypertension   . Palpitations     Current Outpatient Prescriptions  Medication Sig Dispense Refill  . aspirin EC 81 MG tablet Take 81 mg by mouth 2 (two) times daily.     Marland Kitchen atorvastatin (LIPITOR) 20 MG tablet TAKE 1 TABLET(20 MG) BY MOUTH DAILY 30 tablet 11  . BIOTIN PO Take 1 tablet by mouth daily.    . Cholecalciferol (VITAMIN D PO) Take 1 tablet by mouth daily.    Marland Kitchen doxycycline (VIBRA-TABS) 100 MG tablet Take 1 tablet (100 mg total) by mouth 2 (two) times daily. 14 tablet 1  . loratadine (CLARITIN) 10 MG tablet Take 10 mg by mouth daily as needed for allergies.    Marland Kitchen MAGNESIUM PO Take 1 tablet by mouth daily.    . metoprolol tartrate (LOPRESSOR) 25 MG tablet TAKE 1/2 TABLET(12.5 MG) BY MOUTH TWICE DAILY 90 tablet 2  . Multiple Vitamins-Minerals (PRESERVISION AREDS 2) CAPS Take 1 capsule by mouth 2 (two) times daily.    Marland Kitchen tobramycin (TOBREX) 0.3 % ophthalmic solution   5  . TURMERIC PO Take 1 tablet by mouth daily.    . valsartan (DIOVAN) 160  MG tablet TAKE 1 TABLET(160 MG) BY MOUTH DAILY 30 tablet 10  . clobetasol cream (TEMOVATE) 0.05 % Apply 1 application topically 2 (two) times daily. (Patient not taking: Reported on 06/29/2016) 15 g 0   Current Facility-Administered Medications  Medication Dose Route Frequency Provider Last Rate Last Dose  . diphenhydrAMINE (BENADRYL) injection 12.5 mg  12.5 mg Intramuscular Once Stallings, Zoe A, MD      . methylPREDNISolone sodium succinate (SOLU-MEDROL) 125 mg/2 mL injection 125 mg  125 mg Intramuscular Once Doristine Bosworth, MD        Allergies:  Allergies  Allergen Reactions  . Shellfish Allergy Anaphylaxis  . Demerol   . Sulfa Antibiotics     Past Surgical History:  Procedure Laterality Date  . APPENDECTOMY    . CHOLECYSTECTOMY    . DOPPLER ECHOCARDIOGRAPHY  2011  . EYE SURGERY    . TUBAL LIGATION      Social History   Social History  . Marital status: Legally Separated    Spouse name: N/A  . Number of children: N/A  . Years of education: N/A   Social History Main Topics  . Smoking status: Never Smoker  . Smokeless tobacco: Never Used  .  Alcohol use None  . Drug use: Unknown  . Sexual activity: Not Asked   Other Topics Concern  . None   Social History Narrative  . None    ROS  Objective: Vitals:   07/01/16 1135  BP: (!) 199/100  Pulse: 68  Resp: 18  SpO2: 96%  Weight: 87 lb 6.4 oz (39.6 kg)  Height: 4\' 10"  (1.473 m)    Physical Exam  General: alert, oriented, in NAD Head: normocephalic, atraumatic, no sinus tenderness Eyes: EOM intact, no scleral icterus or conjunctival injection Ears: TM clear bilaterally Nose: mucosa nonerythematous, nonedematous Throat: no pharyngeal exudate or erythema Lymph: no posterior auricular, submental or cervical lymph adenopathy Heart: normal rate, normal sinus rhythm, no murmurs Lungs: clear to auscultation bilaterally, no wheezing  Hand with superficial wound   ECG nsr, no twave changes   Assessment  and Plan Julie Durham was seen today for shortness of breath.  Diagnoses and all orders for this visit:  Shortness of breath -     EKG 12-Lead -     diphenhydrAMINE (BENADRYL) injection 12.5 mg; Inject 0.25 mLs (12.5 mg total) into the muscle once. -     methylPREDNISolone sodium succinate (SOLU-MEDROL) 125 mg/2 mL injection 125 mg; Inject 2 mLs (125 mg total) into the muscle once.  Chest tightness -     EKG 12-Lead  Wasp sting, accidental or unintentional, sequela  Normal ecg Gave treatment for allergic reaction  Likely symptoms are due to panic and anxiety but will treat with steroid and antihistamine  A total of 25 minutes were spent face-to-face with the patient during this encounter and over half of that time was spent on counseling and coordination of care.    Zoe A Stallings

## 2016-07-04 ENCOUNTER — Encounter: Payer: Self-pay | Admitting: Family Medicine

## 2016-07-04 ENCOUNTER — Ambulatory Visit (INDEPENDENT_AMBULATORY_CARE_PROVIDER_SITE_OTHER): Payer: Medicare Other | Admitting: Family Medicine

## 2016-07-04 VITALS — BP 145/70 | HR 66 | Temp 98.0°F | Resp 16 | Ht <= 58 in | Wt 86.8 lb

## 2016-07-04 DIAGNOSIS — I10 Essential (primary) hypertension: Secondary | ICD-10-CM

## 2016-07-04 DIAGNOSIS — R0789 Other chest pain: Secondary | ICD-10-CM

## 2016-07-04 DIAGNOSIS — H1132 Conjunctival hemorrhage, left eye: Secondary | ICD-10-CM

## 2016-07-04 DIAGNOSIS — K208 Other esophagitis without bleeding: Secondary | ICD-10-CM

## 2016-07-04 DIAGNOSIS — T887XXD Unspecified adverse effect of drug or medicament, subsequent encounter: Secondary | ICD-10-CM

## 2016-07-04 DIAGNOSIS — T50905A Adverse effect of unspecified drugs, medicaments and biological substances, initial encounter: Secondary | ICD-10-CM

## 2016-07-04 DIAGNOSIS — T50905D Adverse effect of unspecified drugs, medicaments and biological substances, subsequent encounter: Secondary | ICD-10-CM

## 2016-07-04 NOTE — Progress Notes (Signed)
Chief Complaint  Patient presents with  . Follow-up    HPI   Pt here to follow up for hypertension and chest tightness  Drug reaction and pill esophagitis Patient reports that she discussed the medication with the pharmacist and it was found that her chest tightness, sense of closed throat was due to doxycycline She reports that she has stopped her large capsules and supplement  Hypertension: Patient here for follow-up of elevated blood pressure. She reports that her bp has improved She is only taking valsartan and metoprolol She reports that she is feeling much better and no longer has chest pain BP Readings from Last 3 Encounters:  07/04/16 (!) 145/70  07/01/16 (!) 192/90  06/29/16 (!) 177/73   Chest tightness Pt reports that she no longer has been having the chest tightness She said after she left clinic she drank 8 eight oz glasses of water and it flushed down the swelling in the throat and calmed down her chest tightness She thinks the chest tightness was from aggravation of her gerd from vomiting from the doxycycline  Past Medical History:  Diagnosis Date  . Allergy   . Anemia   . Asthma   . Cataract   . Hypertension   . Palpitations     Current Outpatient Prescriptions  Medication Sig Dispense Refill  . aspirin EC 81 MG tablet Take 81 mg by mouth 2 (two) times daily.     Marland Kitchen atorvastatin (LIPITOR) 20 MG tablet TAKE 1 TABLET(20 MG) BY MOUTH DAILY 30 tablet 11  . BIOTIN PO Take 1 tablet by mouth daily.    . Cholecalciferol (VITAMIN D PO) Take 1 tablet by mouth daily.    . clobetasol cream (TEMOVATE) 0.05 % Apply 1 application topically 2 (two) times daily. 15 g 0  . loratadine (CLARITIN) 10 MG tablet Take 10 mg by mouth daily as needed for allergies.    Marland Kitchen MAGNESIUM PO Take 1 tablet by mouth daily.    . metoprolol tartrate (LOPRESSOR) 25 MG tablet TAKE 1/2 TABLET(12.5 MG) BY MOUTH TWICE DAILY 90 tablet 2  . Multiple Vitamins-Minerals (PRESERVISION AREDS 2) CAPS Take  1 capsule by mouth 2 (two) times daily.    Marland Kitchen tobramycin (TOBREX) 0.3 % ophthalmic solution   5  . TURMERIC PO Take 1 tablet by mouth daily.    . valsartan (DIOVAN) 320 MG tablet Take 1 tablet (320 mg total) by mouth daily. 90 tablet 1   No current facility-administered medications for this visit.     Allergies:  Allergies  Allergen Reactions  . Shellfish Allergy Anaphylaxis  . Demerol   . Doxycycline Hyclate     Closed throat, chest pain  . Sulfa Antibiotics     Past Surgical History:  Procedure Laterality Date  . APPENDECTOMY    . CHOLECYSTECTOMY    . DOPPLER ECHOCARDIOGRAPHY  2011  . EYE SURGERY    . TUBAL LIGATION      Social History   Social History  . Marital status: Legally Separated    Spouse name: N/A  . Number of children: N/A  . Years of education: N/A   Social History Main Topics  . Smoking status: Never Smoker  . Smokeless tobacco: Never Used  . Alcohol use None  . Drug use: Unknown  . Sexual activity: Not Asked   Other Topics Concern  . None   Social History Narrative  . None    ROS Review of Systems See HPI Constitution: No fevers or chills No  malaise No diaphoresis Skin: No rash or itching Eyes: no blurry vision, no double vision GU: no dysuria or hematuria Neuro: no dizziness or headaches  Objective: Vitals:   07/04/16 0942 07/04/16 1006  BP: (!) 160/78 (!) 145/70  Pulse: 66   Resp: 16   Temp: 98 F (36.7 C)   TempSrc: Oral   SpO2: 95%   Weight: 86 lb 12.8 oz (39.4 kg)   Height: 4\' 10"  (1.473 m)     Physical Exam  Constitutional: She is oriented to person, place, and time. She appears well-developed and well-nourished.  HENT:  Head: Normocephalic and atraumatic.  Eyes: EOM are normal.  Conjunctival hemorrhage to the lateral aspect of the eye after injection for macular degeneration  Cardiovascular: Normal rate, regular rhythm and normal heart sounds.   Pulmonary/Chest: Effort normal and breath sounds normal. No  respiratory distress. She has no wheezes.  Neurological: She is alert and oriented to person, place, and time.  Psychiatric: She has a normal mood and affect. Her behavior is normal. Judgment and thought content normal.    Assessment and Plan Britta MccreedyBarbara was seen today for follow-up.  Diagnoses and all orders for this visit:  Pill esophagitis Continue taking her medication but to cut them to cut down on her risk for esophagitis Discussed that she should hold her supplements for a month  Chest tightness- resolved after stopping doxycycline  Essential hypertension- bp improved with doubling of the valsartan Continue valsartan and lopressor  Adverse effect of drug, subsequent encounter-  Added doxycycline to her allergy list since it caused throat closing  Conjunctival hemorrhage, left Improving Sequelae of injection for macular degeneration Continue eye drops   Shella Lahman A Creta LevinStallings

## 2016-07-04 NOTE — Patient Instructions (Addendum)
IF you received an x-ray today, you will receive an invoice from [facility]. Please contact [facility] at [phone] with questions or concerns regarding your invoice.   IF you received labwork today, you will receive an invoice from [facility]. Please contact [facility] at [phone] with questions or concerns regarding your invoice.   Our billing staff will not be able to assist you with questions regarding bills from these companies.  You will be contacted with the lab results as soon as they are available. The fastest way to get your results is to activate your My Chart account. Instructions are located on the last page of this paperwork. If you have not heard from [place] regarding the results in 2 weeks, please contact this office.     Esophagitis Esophagitis is inflammation of the esophagus. The esophagus is the tube that carries food and liquids from your mouth to your stomach. Esophagitis can cause soreness or pain in the esophagus. This condition can make it difficult and painful to swallow. What are the causes? Most causes of esophagitis are not serious. Common causes of this condition include:  Gastroesophageal reflux disease (GERD). This is when stomach contents move back up into the esophagus (reflux).  Repeated vomiting.  An allergic-type reaction, especially caused by food allergies (eosinophilic esophagitis).  Injury to the esophagus by swallowing large pills with or without water, or swallowing certain types of medicines.  Swallowing (ingesting) harmful chemicals, such as household cleaning products.  Heavy alcohol use.  An infection of the esophagus.This most often occurs in people who have a weakened immune system.  Radiation or chemotherapy treatment for cancer.  Certain diseases such as sarcoidosis, Crohn disease, and scleroderma.  What are the signs or symptoms? Symptoms of this condition include:  Difficult or painful  swallowing.  Pain with swallowing acidic liquids, such as citrus juices.  Pain with burping.  Chest pain.  Difficulty breathing.  Nausea.  Vomiting.  Pain in the abdomen.  Weight loss.  Ulcers in the mouth.  Patches of white material in the mouth (candidiasis).  Fever.  Coughing up blood or vomiting blood.  Stool that is black, tarry, or bright red.  How is this diagnosed? Your health care provider will take a medical history and perform a physical exam. You may also have other tests, including:  An endoscopy to examine your stomach and esophagus with a small camera.  A test that measures the acidity level in your esophagus.  A test that measures how much pressure is on your esophagus.  A barium swallow or modified barium swallow to show the shape, size, and functioning of your esophagus.  Allergy tests.  How is this treated? Treatment for this condition depends on the cause of your esophagitis. In some cases, steroids or other medicines may be given to help relieve your symptoms or to treat the underlying cause of your condition. You may have to make some lifestyle changes, such as:  Avoiding alcohol.  Quitting smoking.  Changing your diet.  Exercising.  Changing your sleep habits and your sleep environment.  Follow these instructions at home: Take these actions to decrease your discomfort and to help avoid complications. Diet  Follow a diet as recommended by your health care provider. This may involve avoiding foods and drinks such as: ? Coffee and tea (with or without caffeine). ? Drinks that contain alcohol. ? Energy drinks and sports drinks. ? Carbonated drinks or sodas. ? Chocolate and cocoa. ? Peppermint and mint flavorings. ?  Garlic and onions. ? Horseradish. ? Spicy and acidic foods, including peppers, chili powder, curry powder, vinegar, hot sauces, and barbecue sauce. ? Citrus fruit juices and citrus fruits, such as oranges, lemons, and  limes. ? Tomato-based foods, such as red sauce, chili, salsa, and pizza with red sauce. ? Fried and fatty foods, such as donuts, french fries, potato chips, and high-fat dressings. ? High-fat meats, such as hot dogs and fatty cuts of red and white meats, such as rib eye steak, sausage, ham, and bacon. ? High-fat dairy items, such as whole milk, butter, and cream cheese.  Eat small, frequent meals instead of large meals.  Avoid drinking large amounts of liquid with your meals.  Avoid eating meals during the 2-3 hours before bedtime.  Avoid lying down right after you eat.  Do not exercise right after you eat.  Avoid foods and drinks that seem to make your symptoms worse. General instructions  Pay attention to any changes in your symptoms.  Take over-the-counter and prescription medicines only as told by your health care provider. Do not take aspirin, ibuprofen, or other NSAIDs unless your health care provider told you to do so.  If you have trouble taking pills, use a pill splitter to decrease the size of the pill. This will decrease the chance of the pill getting stuck or injuring your esophagus on the way down. Also, drink water after you take a pill.  Do not use any tobacco products, including cigarettes, chewing tobacco, and e-cigarettes. If you need help quitting, ask your health care provider.  Wear loose-fitting clothing. Do not wear anything tight around your waist that causes pressure on your abdomen.  Raise (elevate) the head of your bed about 6 inches (15 cm).  Try to reduce your stress, such as with yoga or meditation. If you need help reducing stress, ask your health care provider.  If you are overweight, reduce your weight to an amount that is healthy for you. Ask your health care provider for guidance about a safe weight loss goal.  Keep all follow-up visits as told by your health care provider. This is important. Contact a health care provider if:  You have new  symptoms.  You have unexplained weight loss.  You have difficulty swallowing, or it hurts to swallow.  You have wheezing or a persistent cough.  Your symptoms do not improve with treatment.  You have frequent heartburn for more than two weeks. Get help right away if:  You have severe pain in your arms, neck, jaw, teeth, or back.  You feel sweaty, dizzy, or light-headed.  You have chest pain or shortness of breath.  You vomit and your vomit looks like blood or coffee grounds.  Your stool is bloody or black.  You have a fever.  You cannot swallow, drink, or eat. This information is not intended to replace advice given to you by your health care provider. Make sure you discuss any questions you have with your health care provider. Document Released: 02/03/2004 Document Revised: 06/03/2015 Document Reviewed: 04/22/2014 Elsevier Interactive Patient Education  [employer]2018 Elsevier Inc.

## 2016-07-28 DIAGNOSIS — H353221 Exudative age-related macular degeneration, left eye, with active choroidal neovascularization: Secondary | ICD-10-CM | POA: Diagnosis not present

## 2016-07-28 DIAGNOSIS — H35033 Hypertensive retinopathy, bilateral: Secondary | ICD-10-CM | POA: Diagnosis not present

## 2016-07-28 DIAGNOSIS — H43813 Vitreous degeneration, bilateral: Secondary | ICD-10-CM | POA: Diagnosis not present

## 2016-07-28 DIAGNOSIS — H353112 Nonexudative age-related macular degeneration, right eye, intermediate dry stage: Secondary | ICD-10-CM | POA: Diagnosis not present

## 2016-08-03 ENCOUNTER — Telehealth: Payer: Self-pay | Admitting: Cardiovascular Disease

## 2016-08-03 MED ORDER — IRBESARTAN 150 MG PO TABS: 150.0000 mg | ORAL_TABLET | Freq: Every day | ORAL | 5 refills | Status: DC

## 2016-08-03 NOTE — Telephone Encounter (Signed)
Pt c/o medication issue:  1. Name of Medication: valsartan  2. How are you currently taking this medication (dosage and times per day)?  160mg  1day-am   3. Are you having a reaction (difficulty breathing--STAT)? no  4. What is your medication issue? The recall for the medication pt received the letter  Pt verbalized two weeks ago she was having issues from another medication not Valsartan   for a bee sting another physician prescribed it

## 2016-08-03 NOTE — Telephone Encounter (Signed)
Per protocol, no HF, switched to irbesartan 150 mg qd

## 2016-09-06 DIAGNOSIS — H43813 Vitreous degeneration, bilateral: Secondary | ICD-10-CM | POA: Diagnosis not present

## 2016-09-06 DIAGNOSIS — H353112 Nonexudative age-related macular degeneration, right eye, intermediate dry stage: Secondary | ICD-10-CM | POA: Diagnosis not present

## 2016-09-06 DIAGNOSIS — H353221 Exudative age-related macular degeneration, left eye, with active choroidal neovascularization: Secondary | ICD-10-CM | POA: Diagnosis not present

## 2016-09-06 DIAGNOSIS — H35033 Hypertensive retinopathy, bilateral: Secondary | ICD-10-CM | POA: Diagnosis not present

## 2016-10-04 ENCOUNTER — Encounter: Payer: Self-pay | Admitting: Family Medicine

## 2016-10-04 ENCOUNTER — Ambulatory Visit (INDEPENDENT_AMBULATORY_CARE_PROVIDER_SITE_OTHER): Payer: Medicare Other | Admitting: Family Medicine

## 2016-10-04 VITALS — BP 124/72 | HR 82 | Temp 97.7°F | Resp 16 | Ht <= 58 in | Wt 84.0 lb

## 2016-10-04 DIAGNOSIS — I10 Essential (primary) hypertension: Secondary | ICD-10-CM

## 2016-10-04 DIAGNOSIS — T50905A Adverse effect of unspecified drugs, medicaments and biological substances, initial encounter: Secondary | ICD-10-CM

## 2016-10-04 DIAGNOSIS — K208 Other esophagitis without bleeding: Secondary | ICD-10-CM | POA: Insufficient documentation

## 2016-10-04 DIAGNOSIS — R1310 Dysphagia, unspecified: Secondary | ICD-10-CM | POA: Insufficient documentation

## 2016-10-04 DIAGNOSIS — M71322 Other bursal cyst, left elbow: Secondary | ICD-10-CM

## 2016-10-04 DIAGNOSIS — R1312 Dysphagia, oropharyngeal phase: Secondary | ICD-10-CM

## 2016-10-04 HISTORY — DX: Adverse effect of unspecified drugs, medicaments and biological substances, initial encounter: T50.905A

## 2016-10-04 HISTORY — DX: Dysphagia, oropharyngeal phase: R13.12

## 2016-10-04 HISTORY — DX: Other bursal cyst, left elbow: M71.322

## 2016-10-04 NOTE — Progress Notes (Signed)
Chief Complaint  Patient presents with  . Blood Pressure Check    follow up  . left arm    has a bump on left arm she wants the doctor to check    HPI    Hypertension: Pt is here today for her bp  She is taking metoprolol and irbesartan.  Due to the valsartan recall she is no longer on this medication.  She is exercising and is adherent to low salt diet.  Blood pressure is well controlled at home. Cardiac symptoms none. Patient denies chest pain, claudication, dyspnea, exertional chest pressure/discomfort, irregular heart beat and palpitations.  Cardiovascular risk factors: advanced age (older than 58 for men, 63 for women) and hypertension. Use of agents associated with hypertension: none. History of target organ damage: none.  BP Readings from Last 3 Encounters:  10/04/16 124/72  07/04/16 (!) 145/70  07/01/16 (!) 192/90    Dysphagia Dysphagia: Patient presents with dysphagia. The patient describes multiple episode(s) with worsening onset, located in the back of mouth, high throat and mid chest, and symptoms lasting a few days. Patient reports any solid foods and pills have become stuck. Associated with the dysphagia, patient also complains of dysphagia and globus sensation. Patient denies hoarseness, post nasal drainage, hemoptysis, heartburn and aspiration.  Patient denies regurgitation of undigested food. She reports that some 20-30 years ago she had an esophageal stricture and underwent dilation  Bursal Cyst left elbow Pt with a small bump in the anticubital fossa of the left elbow that appeared after lifting a heavy bag of cat litter Nontender No weakness   Past Medical History:  Diagnosis Date  . Allergy   . Anemia   . Asthma   . Cataract   . Hypertension   . Palpitations     Current Outpatient Prescriptions  Medication Sig Dispense Refill  . aspirin EC 81 MG tablet Take 81 mg by mouth 2 (two) times daily.     Marland Kitchen atorvastatin (LIPITOR) 20 MG tablet TAKE 1 TABLET(20  MG) BY MOUTH DAILY 30 tablet 11  . BIOTIN PO Take 1 tablet by mouth daily.    . Cholecalciferol (VITAMIN D PO) Take 1 tablet by mouth daily.    . clobetasol cream (TEMOVATE) 0.05 % Apply 1 application topically 2 (two) times daily. 15 g 0  . irbesartan (AVAPRO) 150 MG tablet Take 1 tablet (150 mg total) by mouth daily. 30 tablet 5  . loratadine (CLARITIN) 10 MG tablet Take 10 mg by mouth daily as needed for allergies.    Marland Kitchen MAGNESIUM PO Take 1 tablet by mouth daily.    . metoprolol tartrate (LOPRESSOR) 25 MG tablet TAKE 1/2 TABLET(12.5 MG) BY MOUTH TWICE DAILY 90 tablet 2  . Multiple Vitamins-Minerals (PRESERVISION AREDS 2) CAPS Take 1 capsule by mouth 2 (two) times daily.    Marland Kitchen tobramycin (TOBREX) 0.3 % ophthalmic solution   5  . TURMERIC PO Take 1 tablet by mouth daily.     No current facility-administered medications for this visit.     Allergies:  Allergies  Allergen Reactions  . Shellfish Allergy Anaphylaxis  . Demerol   . Doxycycline Hyclate     Closed throat, chest pain  . Sulfa Antibiotics     Past Surgical History:  Procedure Laterality Date  . APPENDECTOMY    . CHOLECYSTECTOMY    . DOPPLER ECHOCARDIOGRAPHY  2011  . EYE SURGERY    . TUBAL LIGATION      Social History   Social History  .  Marital status: Legally Separated    Spouse name: N/A  . Number of children: N/A  . Years of education: N/A   Social History Main Topics  . Smoking status: Never Smoker  . Smokeless tobacco: Never Used  . Alcohol use None  . Drug use: Unknown  . Sexual activity: Not Asked   Other Topics Concern  . None   Social History Narrative  . None    ROS See hpi Review of Systems See HPI Constitution: No fevers or chills No malaise No diaphoresis Skin: No rash or itching Eyes: no blurry vision, no double vision GU: no dysuria or hematuria Neuro: no dizziness or headaches  Objective: Vitals:   10/04/16 0950  BP: 124/72  Pulse: 82  Resp: 16  Temp: 97.7 F (36.5 C)    TempSrc: Oral  SpO2: 93%  Weight: 84 lb (38.1 kg)  Height:  (1.473 m)    Physical Exam  Constitutional: She is oriented to person, place, and time. She appears well-developed and well-nourished.  HENT:  Head: Normocephalic and atraumatic.  Cardiovascular: Normal rate, regular rhythm and normal heart sounds.   Pulmonary/Chest: Effort normal and breath sounds normal. No respiratory distress. She has no wheezes.  Musculoskeletal: Normal range of motion. She exhibits no edema.       Left elbow: She exhibits normal range of motion, no swelling, no effusion, no deformity and no laceration. No tenderness found.       Arms: Neurological: She is alert and oriented to person, place, and time.    Assessment and Plan Dakiya was seen today for blood pressure check and left arm.  Diagnoses and all orders for this visit:  Pill esophagitis -     Ambulatory referral to Gastroenterology  Essential hypertension- bp at goal, cpm  Oropharyngeal dysphagia- will refer to GI because of dysphagia and pill esophagitis And given history of previous esphageal stricture -     Ambulatory referral to Gastroenterology  Other bursal cyst, left elbow  Other orders -     Cancel: Flu Vaccine QUAD 36+ mos IM -     Cancel: Tdap vaccine greater than or equal to 7yo IM     Zoe A [employer]

## 2016-10-04 NOTE — Patient Instructions (Addendum)
Please set patient up for a medicare wellness exam    IF you received an x-ray today, you will receive an invoice from [facility]. Please contact [facility] at [phone] with questions or concerns regarding your invoice.   IF you received labwork today, you will receive an invoice from [place]. Please contact [facility] at [phone] with questions or concerns regarding your invoice.   Our billing staff will not be able to assist you with questions regarding bills from these companies.  You will be contacted with the lab results as soon as they are available. The fastest way to get your results is to activate your My Chart account. Instructions are located on the last page of this paperwork. If you have not heard from [place] regarding the results in 2 weeks, please contact this office.     Dysphagia Dysphagia is trouble swallowing. This condition occurs when solids and liquids stick in a person's throat on the way down to the stomach, or when food takes longer to get to the stomach. You may have problems swallowing food, liquids, or both. You may also have pain while trying to swallow. It may take you more time and effort to swallow something. What are the causes? This condition is caused by:  Problems with the muscles. They may make it difficult for you to move food and liquids through the tube that connects your mouth to your stomach (esophagus). You may have ulcers, scar tissue, or inflammation that blocks the normal passage of food and liquids. Causes of these problems include: ? Acid reflux from your stomach into your esophagus (gastroesophageal reflux). ? Infections. ? Radiation treatment for cancer. ? Medicines taken without enough fluids to wash them down into your stomach.  Nerve problems. These prevent signals from being sent to the muscles of your esophagus to squeeze (contract) and move what you swallow down to your stomach.  Globus pharyngeus. This is a  common problem that involves feeling like something is stuck in the throat or a sense of trouble with swallowing even though nothing is wrong with the swallowing passages.  Stroke. This can affect the nerves and make it difficult to swallow.  Certain conditions, such as cerebral palsy or Parkinson disease.  What are the signs or symptoms? Common symptoms of this condition include:  A feeling that solids or liquids are stuck in your throat on the way down to the stomach.  Food taking too long to get to the stomach.  Other symptoms include:  Food moving back from your stomach to your mouth (regurgitation).  Noises coming from your throat.  Chest discomfort with swallowing.  A feeling of fullness when swallowing.  Drooling, especially when the throat is blocked.  Pain while swallowing.  Heartburn.  Coughing or gagging while trying to swallow.  How is this diagnosed? This condition is diagnosed by:  Barium X-ray. In this test, you swallow a white substance (contrast medium)that sticks to the inside of your esophagus. X-ray images are then taken.  Endoscopy. In this test, a flexible telescope is inserted down your throat to look at your esophagus and your stomach.  CT scans and MRI.  How is this treated? Treatment for dysphagia depends on the cause of the condition:  If the dysphagia is caused by acid reflux or infection, medicines may be used. They may include antibiotics and heartburn medicines.  If the dysphagia is caused by problems with your muscles, swallowing therapy may be used to help you strengthen your swallowing muscles.  You may have to do specific exercises to strengthen the muscles or stretch them.  If the dysphagia is caused by a blockage or mass, procedures to remove the blockage may be done. You may need surgery and a feeding tube.  You may need to make diet changes. Ask your health care provider for specific instructions. Follow these instructions at  home: Eating and drinking  Try to eat soft food that is easier to swallow.  Follow any diet changes as told by your health care provider.  Cut your food into small pieces and eat slowly.  Eat and drink only when you are sitting upright.  Do not drink alcohol or caffeine. If you need help quitting, ask your health care provider. General instructions  Check your weight every day to make sure you are not losing weight.  Take over-the-counter and prescription medicines only as told by your health care provider.  If you were prescribed an antibiotic medicine, take it as told by your health care provider. Do not stop taking the antibiotic even if you start to feel better.  Do not use any products that contain nicotine or tobacco, such as cigarettes and e-cigarettes. If you need help quitting, ask your health care provider.  Keep all follow-up visits as told by your health care provider. This is important. Contact a health care provider if:  You lose weight because you cannot swallow.  You cough when you drink liquids (aspiration).  You cough up partially digested food. Get help right away if:  You cannot swallow your saliva.  You have shortness of breath or a fever, or both.  You have a hoarse voice and also have trouble swallowing. Summary  Dysphagia is trouble swallowing. This condition occurs when solids and liquids stick in a person's throat on the way down to the stomach, or when food takes longer to get to the stomach.  Dysphagia has many possible causes and symptoms.  Treatment for dysphagia depends on the cause of the condition. This information is not intended to replace advice given to you by your health care provider. Make sure you discuss any questions you have with your health care provider. Document Released: 12/24/1999 Document Revised: 12/16/2015 Document Reviewed: 12/16/2015 Elsevier Interactive Patient Education  2017 [employer].

## 2016-11-01 DIAGNOSIS — H353112 Nonexudative age-related macular degeneration, right eye, intermediate dry stage: Secondary | ICD-10-CM | POA: Diagnosis not present

## 2016-11-01 DIAGNOSIS — H35033 Hypertensive retinopathy, bilateral: Secondary | ICD-10-CM | POA: Diagnosis not present

## 2016-11-01 DIAGNOSIS — H353221 Exudative age-related macular degeneration, left eye, with active choroidal neovascularization: Secondary | ICD-10-CM | POA: Diagnosis not present

## 2016-11-01 DIAGNOSIS — H43813 Vitreous degeneration, bilateral: Secondary | ICD-10-CM | POA: Diagnosis not present

## 2016-11-21 ENCOUNTER — Encounter: Payer: Self-pay | Admitting: Family Medicine

## 2016-12-12 ENCOUNTER — Other Ambulatory Visit: Payer: Self-pay | Admitting: Cardiovascular Disease

## 2017-01-24 ENCOUNTER — Other Ambulatory Visit: Payer: Self-pay | Admitting: Cardiovascular Disease

## 2017-01-24 NOTE — Telephone Encounter (Signed)
REFILL 

## 2017-01-25 ENCOUNTER — Other Ambulatory Visit: Payer: Self-pay | Admitting: Cardiovascular Disease

## 2017-02-28 ENCOUNTER — Ambulatory Visit: Payer: Medicare Other | Admitting: Cardiovascular Disease

## 2017-02-28 ENCOUNTER — Encounter: Payer: Self-pay | Admitting: Cardiovascular Disease

## 2017-02-28 DIAGNOSIS — E7800 Pure hypercholesterolemia, unspecified: Secondary | ICD-10-CM

## 2017-02-28 DIAGNOSIS — I10 Essential (primary) hypertension: Secondary | ICD-10-CM

## 2017-02-28 DIAGNOSIS — E785 Hyperlipidemia, unspecified: Secondary | ICD-10-CM | POA: Insufficient documentation

## 2017-02-28 NOTE — Progress Notes (Signed)
02/28/2017 JNYAH BRAZEE   August 20, 1940  [id number]  Primary Physician Doristine Bosworth, MD Primary Cardiologist: Runell Gess MD Nicholes Cal[place], [place]  HPI:  Julie Durham is a 77 y.o.  thin and fit appearing, Caucasian female mother of 2, grandmother to 2 grandchildren who has worked in [employer] department of [employer] for the last 23 years and she recently retired.. I saw her in the office 02/29/16. Her problems include mild hyperlipidemia, hypertension and a strong family history for heart disease. Her father died of an MI at age 74, and 2 brothers had MIs in their 53s and 13s, as well as a sister who had stents. She has never had a heart attack or stroke and denies chest pain or shortness of breath. She had a Myoview performed a year ago which was normal, and Dopplers involving her renal arteries, carotids and lower extremities all of which were normal as well. Since I saw her back a year ago she's had 4-5 episodes of brief tachypalpitations which either spontaneously resolve resolve with taking a when necessary metoprolol.. She has not taken her hydrochlorothiazide or low-dose amlodipine since August and feels currently better. Since I saw her last she has fallen down while on vacation in [place] injuring her hip, fracturing several ribs and her foot. These have all resolved in the last several months.  Since I saw her a year ago she's remained completely asymptomatic denying chest pain or shortness of breath. She still cuts her own lawn.    Current Meds  Medication Sig  . aspirin EC 81 MG tablet Take 81 mg by mouth 2 (two) times daily.   Marland Kitchen atorvastatin (LIPITOR) 20 MG tablet TAKE 1 TABLET(20 MG) BY MOUTH DAILY  . BIOTIN PO Take 1 tablet by mouth daily.  . Cholecalciferol (VITAMIN D PO) Take 1 tablet by mouth daily.  . irbesartan (AVAPRO) 150 MG tablet TAKE 1 TABLET(150 MG) BY MOUTH DAILY  . loratadine (CLARITIN) 10 MG tablet Take 10 mg by mouth daily as needed for  allergies.  Marland Kitchen MAGNESIUM PO Take 1 tablet by mouth daily.  . metoprolol tartrate (LOPRESSOR) 25 MG tablet Take 0.5 tablets (12.5 mg total) by mouth 2 (two) times daily. KEEP OV.  . Multiple Vitamins-Minerals (PRESERVISION AREDS 2) CAPS Take 1 capsule by mouth 2 (two) times daily.  . TURMERIC PO Take 1 tablet by mouth daily.  . [DISCONTINUED] clobetasol cream (TEMOVATE) 0.05 % Apply 1 application topically 2 (two) times daily.  . [DISCONTINUED] tobramycin (TOBREX) 0.3 % ophthalmic solution      Allergies  Allergen Reactions  . Shellfish Allergy Anaphylaxis  . Demerol   . Doxycycline Hyclate     Closed throat, chest pain  . Sulfa Antibiotics     Social History   Socioeconomic History  . Marital status: Legally Separated    Spouse name: Not on file  . Number of children: Not on file  . Years of education: Not on file  . Highest education level: Not on file  Social Needs  . Financial resource strain: Not on file  . Food insecurity - worry: Not on file  . Food insecurity - inability: Not on file  . Transportation needs - medical: Not on file  . Transportation needs - non-medical: Not on file  Occupational History  . Not on file  Tobacco Use  . Smoking status: Never Smoker  . Smokeless tobacco: Never Used  Substance and Sexual Activity  . Alcohol  use: Not on file  . Drug use: Not on file  . Sexual activity: Not on file  Other Topics Concern  . Not on file  Social History Narrative  . Not on file     Review of Systems: General: negative for chills, fever, night sweats or weight changes.  Cardiovascular: negative for chest pain, dyspnea on exertion, edema, orthopnea, palpitations, paroxysmal nocturnal dyspnea or shortness of breath Dermatological: negative for rash Respiratory: negative for cough or wheezing Urologic: negative for hematuria Abdominal: negative for nausea, vomiting, diarrhea, bright red blood per rectum, melena, or hematemesis Neurologic: negative for  visual changes, syncope, or dizziness All other systems reviewed and are otherwise negative except as noted above.    Blood pressure (!) 171/93, pulse 71, height 4\' 10"  (1.473 m), weight 82 lb 6.4 oz (37.4 kg).  General appearance: alert and no distress Neck: no adenopathy, no carotid bruit, no JVD, supple, symmetrical, trachea midline and thyroid not enlarged, symmetric, no tenderness/mass/nodules Lungs: clear to auscultation bilaterally Heart: regular rate and rhythm, S1, S2 normal, no murmur, click, rub or gallop Extremities: extremities normal, atraumatic, no cyanosis or edema Pulses: 2+ and symmetric Skin: Skin color, texture, turgor normal. No rashes or lesions Neurologic: Alert and oriented X 3, normal strength and tone. Normal symmetric reflexes. Normal coordination and gait  EKG sinus rhythm at 71 with septal Q waves and nonspecific ST and T-wave changes. Personally reviewed this EKG.  ASSESSMENT AND PLAN:   Essential hypertension History of essential hypertension with blood pressure measured 171/93. She is on enalapril and metoprolol. Continue current meds current dosing.  Hyperlipidemia History of hyperlipidemia on statin therapy. We will recheck a lipid and liver profile.      Runell GessJonathan J. Sanyla Summey MD FACP,FACC,FAHA, [facility] 02/28/2017 10:28 AM

## 2017-02-28 NOTE — Patient Instructions (Signed)

## 2017-02-28 NOTE — Assessment & Plan Note (Signed)
History of essential hypertension with blood pressure measured 171/93. She is on enalapril and metoprolol. Continue current meds current dosing.

## 2017-02-28 NOTE — Assessment & Plan Note (Signed)
History of hyperlipidemia on statin therapy. We will recheck a lipid and liver profile 

## 2017-03-01 LAB — HEPATIC FUNCTION PANEL
ALT: 20 IU/L (ref 0–32)
AST: 23 IU/L (ref 0–40)
Albumin: 4.2 g/dL (ref 3.5–4.8)
Alkaline Phosphatase: 98 IU/L (ref 39–117)
Bilirubin Total: 0.4 mg/dL (ref 0.0–1.2)
Bilirubin, Direct: 0.12 mg/dL (ref 0.00–0.40)
Total Protein: 6.6 g/dL (ref 6.0–8.5)

## 2017-03-01 LAB — LIPID PANEL
Chol/HDL Ratio: 2.2 ratio (ref 0.0–4.4)
Cholesterol, Total: 126 mg/dL (ref 100–199)
HDL: 58 mg/dL (ref >39)
LDL Calculated: 47 mg/dL (ref 0–99)
Triglycerides: 103 mg/dL (ref 0–149)
VLDL Cholesterol Cal: 21 mg/dL (ref 5–40)

## 2017-04-11 ENCOUNTER — Encounter: Payer: Self-pay | Admitting: Physician Assistant

## 2017-05-11 ENCOUNTER — Other Ambulatory Visit: Payer: Self-pay | Admitting: Cardiovascular Disease

## 2017-05-21 ENCOUNTER — Ambulatory Visit: Payer: Medicare Other | Admitting: Family Medicine

## 2017-05-21 ENCOUNTER — Encounter: Payer: Self-pay | Admitting: Family Medicine

## 2017-05-21 VITALS — BP 148/70 | HR 96 | Temp 98.0°F | Resp 16 | Ht <= 58 in | Wt 79.4 lb

## 2017-05-21 DIAGNOSIS — I999 Unspecified disorder of circulatory system: Secondary | ICD-10-CM

## 2017-05-21 DIAGNOSIS — I10 Essential (primary) hypertension: Secondary | ICD-10-CM

## 2017-05-21 NOTE — Patient Instructions (Signed)
Vascular Ultrasound An ultrasound, also called sonography or ultrasonography, uses harmless sound waves to take pictures of the inside of your body. The pictures are taken with a device called a transducer that is held up against your body. The continually changing pictures can be recorded on videotape or film. A vascular ultrasound is a painless test to see if you have blood flow problems or clots in your blood vessels. It may be done to look at blood vessels almost anywhere in the body. There are several types of ultrasounds that can be done to look at the blood vessels. They include:  Continuous wave Doppler ultrasound. This type of ultrasound uses the change in pitch of sound waves to provide information about blood flow through a blood vessel. During the test, a health care provider listens to the sounds produced by the transducer.  Duplex ultrasound. This type of ultrasound uses standard ultrasound methods to produce a picture of a blood vessel and surrounding organs. In addition, a computer provides information about the speed and direction of blood flow through the blood vessel. With this type of ultrasound it is possible to see the structures inside the body and to evaluate blood flow within those structures at the same time.  Color Doppler ultrasound. This type of ultrasound uses standard ultrasound methods to produce a picture of a blood vessel. In addition, a computer converts the Doppler sounds into colors that are overlaid on the picture of the blood vessel. These colors represent the speed and direction of blood flow through the vessel.  Power Doppler ultrasound. This type of ultrasound is up to five times more sensitive than color Doppler ultrasound. Power Doppler ultrasound can also get pictures that are difficult or impossible to get using standard color Doppler ultrasound. Power Doppler ultrasound is most commonly used to evaluate blood flow through vessels within organs, such as the  liver or kidneys.  Transcranial Doppler ultrasound. This type of ultrasound looks at blood flow in blood vessels throughout the brain. It can reveal the presence of narrow arteries, clots blocking the vessels, or malformed blood vessels.  What are the risks? There are no known risks or complications of having an ultrasound. What happens before the procedure?  If the ultrasound scan involves your upper abdomen, you may be directed not to eat, smoke, or chew gum the morning of your exam. Follow your health care provider's instructions.  During the test, a gel will be applied to your skin. Wear clothing that is easily washable in case the gel gets on your clothes. What happens during the procedure?  A gel will be applied to your skin. It may feel cool.  The transducer will be placed on the area to be examined.  Pictures will be taken. They will be displayed on one or more monitors that look like small television screens. What happens after the procedure?  You can safely drive home and return to regular activities immediately after your exam.  Keep follow-up visits as directed by your health care provider.  Ask when your test results will be ready. It is your responsibility to get your test results. This information is not intended to replace advice given to you by your health care provider. Make sure you discuss any questions you have with your health care provider. Document Released: 01/07/2004 Document Revised: 06/03/2015 Document Reviewed: 03/20/2013 Elsevier Interactive Patient Education  2018 Elsevier Inc.  

## 2017-05-21 NOTE — Progress Notes (Signed)
Chief Complaint  Patient presents with  . ? blood clots in left arm (2) of them.      family hx of blood clots, sister died of clots, per pt told they were cyst but per pt she has had cyst before and these don't look like cyst.  Per pt lightheadedness upon waking up and allergies flared.    HPI   Patient reports that she has been having small "cysts"  States that her children on Mother's Day told her it looked like a clot She states that her sister had a stroke from a blood clot She has no left arm pain, edema or erythema She reports that she has been having cold hands and feet and wondering if she had any problems with circulation    Past Medical History:  Diagnosis Date  . Allergy   . Anemia   . Asthma   . Cataract   . Hypertension   . Palpitations     Current Outpatient Medications  Medication Sig Dispense Refill  . aspirin EC 81 MG tablet Take 81 mg by mouth 2 (two) times daily.     Marland Kitchen atorvastatin (LIPITOR) 20 MG tablet TAKE 1 TABLET(20 MG) BY MOUTH DAILY 90 tablet 0  . BIOTIN PO Take 1 tablet by mouth daily.    . Cholecalciferol (VITAMIN D PO) Take 1 tablet by mouth daily.    . irbesartan (AVAPRO) 150 MG tablet TAKE 1 TABLET(150 MG) BY MOUTH DAILY 30 tablet 4  . loratadine (CLARITIN) 10 MG tablet Take 10 mg by mouth daily as needed for allergies.    Marland Kitchen MAGNESIUM PO Take 1 tablet by mouth daily.    . metoprolol tartrate (LOPRESSOR) 25 MG tablet TAKE 1/2 TABLET BY MOUTH TWICE DAILY. MUST KEEP OFFICE VISIT 90 tablet 0  . Multiple Vitamins-Minerals (PRESERVISION AREDS 2) CAPS Take 1 capsule by mouth 2 (two) times daily.    . TURMERIC PO Take 1 tablet by mouth daily.     No current facility-administered medications for this visit.     Allergies:  Allergies  Allergen Reactions  . Shellfish Allergy Anaphylaxis  . Demerol   . Doxycycline Hyclate     Closed throat, chest pain  . Sulfa Antibiotics     Past Surgical History:  Procedure Laterality Date  . APPENDECTOMY     . CHOLECYSTECTOMY    . DOPPLER ECHOCARDIOGRAPHY  2011  . EYE SURGERY    . TUBAL LIGATION      Social History   Socioeconomic History  . Marital status: Legally Separated    Spouse name: Not on file  . Number of children: Not on file  . Years of education: Not on file  . Highest education level: Not on file  Occupational History  . Not on file  Social Needs  . Financial resource strain: Not on file  . Food insecurity:    Worry: Not on file    Inability: Not on file  . Transportation needs:    Medical: Not on file    Non-medical: Not on file  Tobacco Use  . Smoking status: Never Smoker  . Smokeless tobacco: Never Used  Substance and Sexual Activity  . Alcohol use: Not on file  . Drug use: Not on file  . Sexual activity: Not on file  Lifestyle  . Physical activity:    Days per week: Not on file    Minutes per session: Not on file  . Stress: Not on file  Relationships  .  Social connections:    Talks on phone: Not on file    Gets together: Not on file    Attends religious service: Not on file    Active member of club or organization: Not on file    Attends meetings of clubs or organizations: Not on file    Relationship status: Not on file  Other Topics Concern  . Not on file  Social History Narrative  . Not on file    Family History  Problem Relation Age of Onset  . Alzheimer's disease Mother   . Heart attack Father   . Heart disease Brother   . Diabetes Son   . Sensorineural hearing loss Sister   . Heart disease Brother   . Heart disease Brother   . Thyroid disease Brother      ROS Review of Systems See HPI Constitution: No fevers or chills No malaise No diaphoresis Skin: No rash or itching Eyes: no blurry vision, no double vision GU: no dysuria or hematuria Neuro: no dizziness or headaches * all others reviewed and negative   Objective: Vitals:   05/21/17 1037 05/21/17 1145  BP: (!) 176/93 (!) 148/70  Pulse: 96   Resp: 16   Temp: 98 F  (36.7 C)   TempSrc: Oral   SpO2: 96%   Weight: 79 lb 6.4 oz (36 kg)   Height:  (1.473 m)    BP Readings from Last 3 Encounters:  05/21/17 (!) 148/70  02/28/17 (!) 171/93  10/04/16 124/72     Physical Exam  Constitutional: She is oriented to person, place, and time. She appears well-developed and well-nourished.  HENT:  Head: Normocephalic and atraumatic.  Eyes: Conjunctivae and EOM are normal.  Cardiovascular: Normal rate, regular rhythm and normal heart sounds.  Pulmonary/Chest: Effort normal and breath sounds normal. No stridor. No respiratory distress.  Musculoskeletal:       Arms: Neurological: She is alert and oriented to person, place, and time.  Psychiatric: Her behavior is normal. Judgment and thought content normal.  anxious affect  left arm evaluation: small cyst along the left arm in the antecubital fossa running along the vein    Assessment and Plan Julie Durham was seen today for ? blood clots in left arm (2) of them.  .  Diagnoses and all orders for this visit:  Disorder of blood vessel- will get ultrasound to rule out DVT  DVT is not highly likely thus outpatient referral placed -     Cancel: VAS [place] UPPER EXTREMITY VENOUS DUPLEX; Future -     VAS [place] UPPER EXTREMITY VENOUS DUPLEX; Future  Essential hypertension- bp improved on recheck after reassurance     Zoe A Creta Levin

## 2017-05-22 ENCOUNTER — Ambulatory Visit (HOSPITAL_COMMUNITY)
Admission: RE | Admit: 2017-05-22 | Discharge: 2017-05-22 | Disposition: A | Discharge status: Home / Self Care | Payer: Medicare Other | Source: Ambulatory Visit | Attending: Family Medicine | Admitting: Family Medicine

## 2017-05-22 DIAGNOSIS — I999 Unspecified disorder of circulatory system: Secondary | ICD-10-CM | POA: Insufficient documentation

## 2017-05-22 NOTE — Progress Notes (Signed)
Preliminary results by tech - Venous Duplex Upper Ext. Left Completed. Negative for deep and superficial vein thrombosis. Marilynne Halsted, BS, RDMS, RVT

## 2017-05-23 DIAGNOSIS — H353112 Nonexudative age-related macular degeneration, right eye, intermediate dry stage: Secondary | ICD-10-CM | POA: Diagnosis not present

## 2017-05-23 DIAGNOSIS — H43813 Vitreous degeneration, bilateral: Secondary | ICD-10-CM | POA: Diagnosis not present

## 2017-05-23 DIAGNOSIS — H353223 Exudative age-related macular degeneration, left eye, with inactive scar: Secondary | ICD-10-CM | POA: Diagnosis not present

## 2017-05-23 DIAGNOSIS — H35033 Hypertensive retinopathy, bilateral: Secondary | ICD-10-CM | POA: Diagnosis not present

## 2017-06-06 ENCOUNTER — Encounter: Payer: Self-pay | Admitting: Family Medicine

## 2017-06-06 ENCOUNTER — Other Ambulatory Visit: Payer: Self-pay | Admitting: Cardiovascular Disease

## 2017-08-01 ENCOUNTER — Other Ambulatory Visit: Payer: Self-pay | Admitting: Cardiovascular Disease

## 2017-08-01 NOTE — Telephone Encounter (Signed)
Rx sent to pharmacy   

## 2017-08-22 DIAGNOSIS — H353112 Nonexudative age-related macular degeneration, right eye, intermediate dry stage: Secondary | ICD-10-CM | POA: Diagnosis not present

## 2017-08-22 DIAGNOSIS — H353223 Exudative age-related macular degeneration, left eye, with inactive scar: Secondary | ICD-10-CM | POA: Diagnosis not present

## 2017-08-28 ENCOUNTER — Inpatient Hospital Stay (HOSPITAL_COMMUNITY): Admit: 2017-08-28 | Payer: Self-pay

## 2017-09-05 DIAGNOSIS — H353221 Exudative age-related macular degeneration, left eye, with active choroidal neovascularization: Secondary | ICD-10-CM | POA: Diagnosis not present

## 2017-09-12 ENCOUNTER — Ambulatory Visit (INDEPENDENT_AMBULATORY_CARE_PROVIDER_SITE_OTHER): Payer: Medicare Other

## 2017-09-12 ENCOUNTER — Ambulatory Visit: Payer: Medicare Other | Admitting: Physician Assistant

## 2017-09-12 ENCOUNTER — Encounter: Payer: Self-pay | Admitting: Physician Assistant

## 2017-09-12 ENCOUNTER — Other Ambulatory Visit: Payer: Self-pay

## 2017-09-12 VITALS — BP 152/80 | HR 95 | Temp 99.0°F | Resp 18 | Ht <= 58 in | Wt 77.2 lb

## 2017-09-12 DIAGNOSIS — M549 Dorsalgia, unspecified: Secondary | ICD-10-CM | POA: Diagnosis not present

## 2017-09-12 DIAGNOSIS — M545 Low back pain, unspecified: Secondary | ICD-10-CM

## 2017-09-12 DIAGNOSIS — S22000A Wedge compression fracture of unspecified thoracic vertebra, initial encounter for closed fracture: Secondary | ICD-10-CM | POA: Diagnosis not present

## 2017-09-12 DIAGNOSIS — M8580 Other specified disorders of bone density and structure, unspecified site: Secondary | ICD-10-CM

## 2017-09-12 DIAGNOSIS — S32020A Wedge compression fracture of second lumbar vertebra, initial encounter for closed fracture: Secondary | ICD-10-CM | POA: Diagnosis not present

## 2017-09-12 DIAGNOSIS — M546 Pain in thoracic spine: Secondary | ICD-10-CM | POA: Diagnosis not present

## 2017-09-12 MED ORDER — TRAMADOL HCL 50 MG PO TABS: 50.0000 mg | ORAL_TABLET | Freq: Every evening | ORAL | 0 refills | Status: DC | PRN

## 2017-09-12 NOTE — Patient Instructions (Signed)
° ° ° °  If you have lab work done today you will be contacted with your lab results within the next 2 weeks.  If you have not heard from us then please contact us. The fastest way to get your results is to register for My Chart. ° ° °IF you received an x-ray today, you will receive an invoice from [facility]. Please contact [facility] at [phone] with questions or concerns regarding your invoice.  ° °IF you received labwork today, you will receive an invoice from [facility]. Please contact [facility] at [phone] with questions or concerns regarding your invoice.  ° °Our billing staff will not be able to assist you with questions regarding bills from these companies. ° °You will be contacted with the lab results as soon as they are available. The fastest way to get your results is to activate your My Chart account. Instructions are located on the last page of this paperwork. If you have not heard from us regarding the results in 2 weeks, please contact this office. °  ° ° ° °

## 2017-09-12 NOTE — Progress Notes (Signed)
JHERICA KRICHBAUM  MRN: [id number] DOB: 1940/12/06  PCP: Doristine Bosworth, MD  Chief Complaint  Patient presents with  . Back Pain    right hip hurts believes she pulled a muscle picking up a box this weekend     Subjective:  Pt presents to clinic for mid back pain.  She has had no falls recently.  She has been cleaning out her house and she has been moving - pushing/pulling boxes. Movement makes her hurt the most.  Bending down causes the worse pain. Some pain into her pelvis.  She also has decreased sensation in her legs for the last week better.  It is new that when she touched her legs they felt numb.Marland Kitchen  She wears knee braces but they are not new.  She has had no specific injuries that she knows of.  Pt has rheumatoid arthritis.  History is obtained by patient.  Review of Systems  Musculoskeletal: Positive for back pain and gait problem (Walking with cane today due to unsteadiness, this is new for patient over the last week or so.).    Patient Active Problem List   Diagnosis Date Noted  . Hyperlipidemia 02/28/2017  . Oropharyngeal dysphagia 10/04/2016  . Other bursal cyst, left elbow 10/04/2016  . Pill esophagitis 10/04/2016  . Infected insect bite 06/29/2016  . Retained foreign body in soft tissue 06/29/2016  . Essential hypertension 11/18/2012  . Palpitations 11/18/2012    Current Outpatient Medications on File Prior to Visit  Medication Sig Dispense Refill  . aspirin EC 81 MG tablet Take 81 mg by mouth 2 (two) times daily.     Marland Kitchen atorvastatin (LIPITOR) 20 MG tablet TAKE 1 TABLET(20 MG) BY MOUTH DAILY 90 tablet 0  . BIOTIN PO Take 1 tablet by mouth daily.    . Cholecalciferol (VITAMIN D PO) Take 1 tablet by mouth daily.    . irbesartan (AVAPRO) 150 MG tablet TAKE 1 TABLET(150 MG) BY MOUTH DAILY 30 tablet 10  . loratadine (CLARITIN) 10 MG tablet Take 10 mg by mouth daily as needed for allergies.    Marland Kitchen MAGNESIUM PO Take 1 tablet by mouth daily.    . metoprolol tartrate  (LOPRESSOR) 25 MG tablet Take 0.5 tablets (12.5 mg total) by mouth 2 (two) times daily. 90 tablet 1  . Multiple Vitamins-Minerals (PRESERVISION AREDS 2) CAPS Take 1 capsule by mouth 2 (two) times daily.    . TURMERIC PO Take 1 tablet by mouth daily.     No current facility-administered medications on file prior to visit.     Allergies  Allergen Reactions  . Shellfish Allergy Anaphylaxis  . Demerol   . Doxycycline Hyclate     Closed throat, chest pain  . Sulfa Antibiotics     Past Medical History:  Diagnosis Date  . Allergy   . Anemia   . Asthma   . Cataract   . Hypertension   . Palpitations    Social History   Social History Narrative  . Not on file   Social History   Tobacco Use  . Smoking status: Never Smoker  . Smokeless tobacco: Never Used  Substance Use Topics  . Alcohol use: Not on file  . Drug use: Not on file   family history includes Alzheimer's disease in her mother; Diabetes in her son; Heart attack in her father; Heart disease in her brother, brother, and brother; Sensorineural hearing loss in her sister; Thyroid disease in her brother.     Objective:  BP (!) 152/80   Pulse 95   Temp 99 F (37.2 C) (Oral)   Resp 18   Ht 4\' 10"  (1.473 m)   Wt 77 lb 3.2 oz (35 kg)   SpO2 98%   BMI 16.13 kg/m  Body mass index is 16.13 kg/m.  Wt Readings from Last 3 Encounters:  09/12/17 77 lb 3.2 oz (35 kg)  05/21/17 79 lb 6.4 oz (36 kg)  02/28/17 82 lb 6.4 oz (37.4 kg)    Physical Exam  Musculoskeletal:       Right hip: She exhibits tenderness (Over muscles of lateral thigh upper anterior thigh). She exhibits normal range of motion and normal strength.       Left hip: She exhibits tenderness (With palpation along anterior and lateral thigh). She exhibits normal range of motion and normal strength.       Thoracic back: She exhibits tenderness (Along the right para thoracic lumbar spine) and bony tenderness (Lower T-spine). She exhibits normal range of motion  and no spasm.       Lumbar back: She exhibits tenderness (Along lumbar spine).  Neurological: She has normal strength and normal reflexes. No sensory deficit (Monofilament testing on lower extremities equal bilateral and patient can feel sensation).  Good bilateral dorsalis pedis pulses   Dg Thoracic Spine 2 View  Result Date: 09/12/2017 CLINICAL DATA:  77 year old female with 5 days of back pain which increases with activity. Tender to palpation over the lower thoracic region. EXAM: THORACIC SPINE 2 VIEWS COMPARISON:  Lumbar radiographs today reported separately. FINDINGS: Thoracic segmentation appears normal. There are multiple thoracic compression fractures noted at T6, T8, and to a lesser extent T9. There is also an L2 compression fracture, reported separately. Underlying osteopenia. Exaggerated thoracic kyphosis. Relatively preserved thoracic disc spaces. Grossly intact posterior ribs and negative thoracic visceral contours aside from evidence of left atrial enlargement. Calcified aortic atherosclerosis. Cholecystectomy clips in the right upper quadrant. Negative visible bowel gas pattern. IMPRESSION: 1. Osteopenia with multilevel thoracic compression fractures that are age indeterminate: T6, T8, T9. If specific therapy such as vertebroplasty is desired, thoracic MRI or Nuclear Medicine Whole-body Bone Scan would best determine acuity. 2.  Aortic Atherosclerosis (ICD10-I70.0). Electronically Signed   By: Odessa Fleming M.D.   On: 09/12/2017 17:33   Dg Lumbar Spine Complete  Result Date: 09/12/2017 CLINICAL DATA:  77 year old female with 5 days of back pain which increases with activity. Tender to palpation over the lower thoracic region. EXAM: LUMBAR SPINE - COMPLETE 4+ VIEW COMPARISON:  Thoracic radiographs today reported separately. FINDINGS: Normal lumbar segmentation. There is a moderate L2 superior endplate compression fracture with mild endplate sclerosis. Minimal retropulsion suspected. The remaining  lumbar vertebral bodies appear intact. Underlying osteopenia. Grossly intact sacral ala and SI joints. Calcified aortic atherosclerosis. Cholecystectomy clips. Negative visible bowel gas pattern. IMPRESSION: 1. Age indeterminate moderate L2 compression fracture with minimal retropulsion. If specific therapy such as vertebroplasty is desired, Lumbar MRI or Nuclear Medicine Whole-body Bone Scan would best determine acuity. 2. Osteopenia.  Calcified aortic atherosclerosis. Electronically Signed   By: Odessa Fleming M.D.   On: 09/12/2017 17:34    Assessment and Plan :  Mid-back pain, acute - Plan: DG Thoracic Spine 2 View, traMADol (ULTRAM) 50 MG tablet  Acute midline low back pain without sciatica - Plan: DG Lumbar Spine Complete, traMADol (ULTRAM) 50 MG tablet  Osteopenia, unspecified location  Closed compression fracture of thoracic vertebra, initial encounter ([name])  Closed compression fracture of second lumbar  vertebra, initial encounter [facility])   Patient is relatively active, she is recently increased her activity while cleaning out of her house she has a lot of muscle soreness related to that.  Her back pain may be related to multiple compression fractures that were seen on x-ray, though unsure if these are acute.  Will contact patient tomorrow to see how she would like to proceed in regards of MRI for possible kyphoplasty versus monitoring it.  She also has osteopenia and likely osteoporosis with fractire which she did not know about and she needs a bone density -- she was instructed to f/u with PCP.  Patient verbalized to me that they understand the following: diagnosis, what is being done for them, what to expect and what should be done at home.  Their questions have been answered.  See after visit summary for patient specific instructions.  Benny Lennert PA-C  Primary Care at [facility] 09/12/2017 8:55 PM  Please note: Portions of this report may have been transcribed using  dragon voice recognition software. Every effort was made to ensure accuracy; however, inadvertent computerized transcription errors may be present.

## 2017-09-14 ENCOUNTER — Telehealth: Payer: Self-pay | Admitting: Family Medicine

## 2017-09-14 NOTE — Telephone Encounter (Signed)
Copied from CRM [id number]. Topic: Quick Communication - See Telephone Encounter >> Sep 14, 2017 12:35 PM Terisa Starr wrote: CRM for notification. See Telephone encounter for: 09/14/17.  Patient called and said that Dr Sallee Lange @ [facility] would like to see her on Monday @ 3:30. He would like to have her xray results before then. If she needs to pick then up she will be glad to pick them up but needs to know in advance as she can not drive right now. Please advise.

## 2017-09-17 ENCOUNTER — Telehealth: Payer: Self-pay | Admitting: Family Medicine

## 2017-09-17 DIAGNOSIS — M545 Low back pain: Secondary | ICD-10-CM | POA: Diagnosis not present

## 2017-09-17 NOTE — Telephone Encounter (Signed)
Called pt this morning  and informed her that I have sent copies of X-ray to [facility] today and received confirmation. She verbalized understanding.

## 2017-09-17 NOTE — Telephone Encounter (Signed)
Called pt to let her know that [facility] would like for her to set up an appt. With Dr. Creta Levin to discuss  discuss bone density and osteopenia and compression fracture. Left VM for her to call the office and set the appt up at her convenience.   When pt calls back, please set her up for an OV with Dr. Creta Levin to:  discuss bone density and osteopenia and compression fracture.   Thank you!

## 2017-10-08 ENCOUNTER — Other Ambulatory Visit: Payer: Self-pay | Admitting: Cardiovascular Disease

## 2017-10-31 DIAGNOSIS — H353112 Nonexudative age-related macular degeneration, right eye, intermediate dry stage: Secondary | ICD-10-CM | POA: Diagnosis not present

## 2017-10-31 DIAGNOSIS — H35033 Hypertensive retinopathy, bilateral: Secondary | ICD-10-CM | POA: Diagnosis not present

## 2017-10-31 DIAGNOSIS — H43813 Vitreous degeneration, bilateral: Secondary | ICD-10-CM | POA: Diagnosis not present

## 2017-10-31 DIAGNOSIS — H353221 Exudative age-related macular degeneration, left eye, with active choroidal neovascularization: Secondary | ICD-10-CM | POA: Diagnosis not present

## 2017-12-26 DIAGNOSIS — H353112 Nonexudative age-related macular degeneration, right eye, intermediate dry stage: Secondary | ICD-10-CM | POA: Diagnosis not present

## 2017-12-26 DIAGNOSIS — H43813 Vitreous degeneration, bilateral: Secondary | ICD-10-CM | POA: Diagnosis not present

## 2017-12-26 DIAGNOSIS — H353221 Exudative age-related macular degeneration, left eye, with active choroidal neovascularization: Secondary | ICD-10-CM | POA: Diagnosis not present

## 2017-12-26 DIAGNOSIS — H35033 Hypertensive retinopathy, bilateral: Secondary | ICD-10-CM | POA: Diagnosis not present

## 2018-01-07 ENCOUNTER — Other Ambulatory Visit: Payer: Self-pay | Admitting: Cardiovascular Disease

## 2018-03-06 DIAGNOSIS — H353113 Nonexudative age-related macular degeneration, right eye, advanced atrophic without subfoveal involvement: Secondary | ICD-10-CM | POA: Diagnosis not present

## 2018-03-06 DIAGNOSIS — H353221 Exudative age-related macular degeneration, left eye, with active choroidal neovascularization: Secondary | ICD-10-CM | POA: Diagnosis not present

## 2018-03-06 DIAGNOSIS — H35033 Hypertensive retinopathy, bilateral: Secondary | ICD-10-CM | POA: Diagnosis not present

## 2018-03-06 DIAGNOSIS — H43813 Vitreous degeneration, bilateral: Secondary | ICD-10-CM | POA: Diagnosis not present

## 2018-04-16 ENCOUNTER — Other Ambulatory Visit: Payer: Self-pay | Admitting: Cardiovascular Disease

## 2018-04-16 NOTE — Telephone Encounter (Signed)
Atorvastatin refilled.  

## 2018-04-28 ENCOUNTER — Other Ambulatory Visit: Payer: Self-pay | Admitting: Cardiovascular Disease

## 2018-04-29 NOTE — Telephone Encounter (Signed)
Irbesartan 150 mg refilled.

## 2018-05-29 DIAGNOSIS — H353221 Exudative age-related macular degeneration, left eye, with active choroidal neovascularization: Secondary | ICD-10-CM | POA: Diagnosis not present

## 2018-06-06 ENCOUNTER — Other Ambulatory Visit: Payer: Self-pay | Admitting: Cardiovascular Disease

## 2018-08-06 ENCOUNTER — Other Ambulatory Visit: Payer: Self-pay | Admitting: Cardiovascular Disease

## 2018-08-08 DIAGNOSIS — H43813 Vitreous degeneration, bilateral: Secondary | ICD-10-CM | POA: Diagnosis not present

## 2018-08-08 DIAGNOSIS — H35033 Hypertensive retinopathy, bilateral: Secondary | ICD-10-CM | POA: Diagnosis not present

## 2018-08-08 DIAGNOSIS — H353231 Exudative age-related macular degeneration, bilateral, with active choroidal neovascularization: Secondary | ICD-10-CM | POA: Diagnosis not present

## 2018-08-10 DIAGNOSIS — K5730 Diverticulosis of large intestine without perforation or abscess without bleeding: Secondary | ICD-10-CM | POA: Diagnosis not present

## 2018-08-10 DIAGNOSIS — R011 Cardiac murmur, unspecified: Secondary | ICD-10-CM | POA: Diagnosis not present

## 2018-08-10 DIAGNOSIS — R2681 Unsteadiness on feet: Secondary | ICD-10-CM | POA: Diagnosis not present

## 2018-08-10 DIAGNOSIS — R21 Rash and other nonspecific skin eruption: Secondary | ICD-10-CM | POA: Diagnosis not present

## 2018-08-10 DIAGNOSIS — Z8669 Personal history of other diseases of the nervous system and sense organs: Secondary | ICD-10-CM | POA: Diagnosis not present

## 2018-08-10 DIAGNOSIS — R531 Weakness: Secondary | ICD-10-CM | POA: Diagnosis not present

## 2018-08-10 DIAGNOSIS — Z7982 Long term (current) use of aspirin: Secondary | ICD-10-CM | POA: Diagnosis not present

## 2018-08-10 DIAGNOSIS — Z79899 Other long term (current) drug therapy: Secondary | ICD-10-CM | POA: Diagnosis not present

## 2018-08-10 DIAGNOSIS — Z881 Allergy status to other antibiotic agents status: Secondary | ICD-10-CM | POA: Diagnosis not present

## 2018-08-10 DIAGNOSIS — B459 Cryptococcosis, unspecified: Secondary | ICD-10-CM | POA: Diagnosis not present

## 2018-08-10 DIAGNOSIS — H3530 Unspecified macular degeneration: Secondary | ICD-10-CM | POA: Diagnosis not present

## 2018-08-10 DIAGNOSIS — R918 Other nonspecific abnormal finding of lung field: Secondary | ICD-10-CM | POA: Diagnosis not present

## 2018-08-10 DIAGNOSIS — J90 Pleural effusion, not elsewhere classified: Secondary | ICD-10-CM | POA: Diagnosis not present

## 2018-08-10 DIAGNOSIS — G939 Disorder of brain, unspecified: Secondary | ICD-10-CM | POA: Diagnosis not present

## 2018-08-10 DIAGNOSIS — I10 Essential (primary) hypertension: Secondary | ICD-10-CM | POA: Diagnosis not present

## 2018-08-10 DIAGNOSIS — J9811 Atelectasis: Secondary | ICD-10-CM | POA: Diagnosis not present

## 2018-08-10 DIAGNOSIS — Z91013 Allergy to seafood: Secondary | ICD-10-CM | POA: Diagnosis not present

## 2018-08-10 DIAGNOSIS — B020 Zoster encephalitis: Secondary | ICD-10-CM | POA: Diagnosis not present

## 2018-08-10 DIAGNOSIS — K8689 Other specified diseases of pancreas: Secondary | ICD-10-CM | POA: Diagnosis not present

## 2018-08-10 DIAGNOSIS — B029 Zoster without complications: Secondary | ICD-10-CM | POA: Diagnosis not present

## 2018-08-10 DIAGNOSIS — G319 Degenerative disease of nervous system, unspecified: Secondary | ICD-10-CM | POA: Diagnosis not present

## 2018-08-10 DIAGNOSIS — I6782 Cerebral ischemia: Secondary | ICD-10-CM | POA: Diagnosis not present

## 2018-08-10 DIAGNOSIS — N390 Urinary tract infection, site not specified: Secondary | ICD-10-CM | POA: Diagnosis not present

## 2018-08-10 DIAGNOSIS — G9340 Encephalopathy, unspecified: Secondary | ICD-10-CM | POA: Diagnosis not present

## 2018-08-10 DIAGNOSIS — N281 Cyst of kidney, acquired: Secondary | ICD-10-CM | POA: Diagnosis not present

## 2018-08-11 DIAGNOSIS — I10 Essential (primary) hypertension: Secondary | ICD-10-CM | POA: Diagnosis not present

## 2018-08-11 DIAGNOSIS — B029 Zoster without complications: Secondary | ICD-10-CM | POA: Diagnosis not present

## 2018-08-11 DIAGNOSIS — R836 Abnormal cytological findings in cerebrospinal fluid: Secondary | ICD-10-CM | POA: Diagnosis not present

## 2018-08-11 DIAGNOSIS — H3530 Unspecified macular degeneration: Secondary | ICD-10-CM | POA: Diagnosis not present

## 2018-08-11 DIAGNOSIS — N390 Urinary tract infection, site not specified: Secondary | ICD-10-CM | POA: Diagnosis not present

## 2018-08-11 DIAGNOSIS — R011 Cardiac murmur, unspecified: Secondary | ICD-10-CM | POA: Diagnosis not present

## 2018-08-11 DIAGNOSIS — B459 Cryptococcosis, unspecified: Secondary | ICD-10-CM | POA: Diagnosis not present

## 2018-08-11 DIAGNOSIS — Z7982 Long term (current) use of aspirin: Secondary | ICD-10-CM | POA: Diagnosis not present

## 2018-08-11 DIAGNOSIS — Z79899 Other long term (current) drug therapy: Secondary | ICD-10-CM | POA: Diagnosis not present

## 2018-08-11 DIAGNOSIS — J90 Pleural effusion, not elsewhere classified: Secondary | ICD-10-CM | POA: Diagnosis not present

## 2018-08-11 DIAGNOSIS — K5730 Diverticulosis of large intestine without perforation or abscess without bleeding: Secondary | ICD-10-CM | POA: Diagnosis not present

## 2018-08-11 DIAGNOSIS — G319 Degenerative disease of nervous system, unspecified: Secondary | ICD-10-CM | POA: Diagnosis not present

## 2018-08-11 DIAGNOSIS — R2681 Unsteadiness on feet: Secondary | ICD-10-CM | POA: Diagnosis not present

## 2018-08-11 DIAGNOSIS — G9340 Encephalopathy, unspecified: Secondary | ICD-10-CM | POA: Diagnosis not present

## 2018-08-11 DIAGNOSIS — I6782 Cerebral ischemia: Secondary | ICD-10-CM | POA: Diagnosis not present

## 2018-08-11 DIAGNOSIS — R531 Weakness: Secondary | ICD-10-CM | POA: Diagnosis not present

## 2018-08-11 DIAGNOSIS — Z881 Allergy status to other antibiotic agents status: Secondary | ICD-10-CM | POA: Diagnosis not present

## 2018-08-11 DIAGNOSIS — G939 Disorder of brain, unspecified: Secondary | ICD-10-CM | POA: Diagnosis not present

## 2018-08-11 DIAGNOSIS — Z8669 Personal history of other diseases of the nervous system and sense organs: Secondary | ICD-10-CM | POA: Diagnosis not present

## 2018-08-11 DIAGNOSIS — N281 Cyst of kidney, acquired: Secondary | ICD-10-CM | POA: Diagnosis not present

## 2018-08-11 DIAGNOSIS — Z91013 Allergy to seafood: Secondary | ICD-10-CM | POA: Diagnosis not present

## 2018-08-12 DIAGNOSIS — J90 Pleural effusion, not elsewhere classified: Secondary | ICD-10-CM | POA: Diagnosis not present

## 2018-08-12 DIAGNOSIS — Z881 Allergy status to other antibiotic agents status: Secondary | ICD-10-CM | POA: Diagnosis not present

## 2018-08-12 DIAGNOSIS — J9811 Atelectasis: Secondary | ICD-10-CM | POA: Diagnosis not present

## 2018-08-12 DIAGNOSIS — Z8669 Personal history of other diseases of the nervous system and sense organs: Secondary | ICD-10-CM | POA: Diagnosis not present

## 2018-08-12 DIAGNOSIS — I10 Essential (primary) hypertension: Secondary | ICD-10-CM | POA: Diagnosis not present

## 2018-08-12 DIAGNOSIS — K8689 Other specified diseases of pancreas: Secondary | ICD-10-CM | POA: Diagnosis not present

## 2018-08-12 DIAGNOSIS — N281 Cyst of kidney, acquired: Secondary | ICD-10-CM | POA: Diagnosis not present

## 2018-08-12 DIAGNOSIS — B029 Zoster without complications: Secondary | ICD-10-CM | POA: Diagnosis not present

## 2018-08-12 DIAGNOSIS — R531 Weakness: Secondary | ICD-10-CM | POA: Diagnosis not present

## 2018-08-12 DIAGNOSIS — Z91013 Allergy to seafood: Secondary | ICD-10-CM | POA: Diagnosis not present

## 2018-08-12 DIAGNOSIS — K5730 Diverticulosis of large intestine without perforation or abscess without bleeding: Secondary | ICD-10-CM | POA: Diagnosis not present

## 2018-08-12 DIAGNOSIS — R2681 Unsteadiness on feet: Secondary | ICD-10-CM | POA: Diagnosis not present

## 2018-08-12 DIAGNOSIS — G939 Disorder of brain, unspecified: Secondary | ICD-10-CM | POA: Diagnosis not present

## 2018-08-12 DIAGNOSIS — R011 Cardiac murmur, unspecified: Secondary | ICD-10-CM | POA: Diagnosis not present

## 2018-08-12 DIAGNOSIS — G319 Degenerative disease of nervous system, unspecified: Secondary | ICD-10-CM | POA: Diagnosis not present

## 2018-08-12 DIAGNOSIS — H3530 Unspecified macular degeneration: Secondary | ICD-10-CM | POA: Diagnosis not present

## 2018-08-12 DIAGNOSIS — G9340 Encephalopathy, unspecified: Secondary | ICD-10-CM | POA: Diagnosis not present

## 2018-08-12 DIAGNOSIS — R918 Other nonspecific abnormal finding of lung field: Secondary | ICD-10-CM | POA: Diagnosis not present

## 2018-08-12 DIAGNOSIS — N390 Urinary tract infection, site not specified: Secondary | ICD-10-CM | POA: Diagnosis not present

## 2018-08-12 DIAGNOSIS — Z79899 Other long term (current) drug therapy: Secondary | ICD-10-CM | POA: Diagnosis not present

## 2018-08-12 DIAGNOSIS — Z7982 Long term (current) use of aspirin: Secondary | ICD-10-CM | POA: Diagnosis not present

## 2018-08-12 DIAGNOSIS — I6782 Cerebral ischemia: Secondary | ICD-10-CM | POA: Diagnosis not present

## 2018-08-13 DIAGNOSIS — R011 Cardiac murmur, unspecified: Secondary | ICD-10-CM | POA: Diagnosis not present

## 2018-08-13 DIAGNOSIS — Z8669 Personal history of other diseases of the nervous system and sense organs: Secondary | ICD-10-CM | POA: Diagnosis not present

## 2018-08-13 DIAGNOSIS — Z7982 Long term (current) use of aspirin: Secondary | ICD-10-CM | POA: Diagnosis not present

## 2018-08-13 DIAGNOSIS — N281 Cyst of kidney, acquired: Secondary | ICD-10-CM | POA: Diagnosis not present

## 2018-08-13 DIAGNOSIS — R531 Weakness: Secondary | ICD-10-CM | POA: Diagnosis not present

## 2018-08-13 DIAGNOSIS — G9389 Other specified disorders of brain: Secondary | ICD-10-CM | POA: Diagnosis not present

## 2018-08-13 DIAGNOSIS — K5730 Diverticulosis of large intestine without perforation or abscess without bleeding: Secondary | ICD-10-CM | POA: Diagnosis not present

## 2018-08-13 DIAGNOSIS — G9340 Encephalopathy, unspecified: Secondary | ICD-10-CM | POA: Diagnosis not present

## 2018-08-13 DIAGNOSIS — R2681 Unsteadiness on feet: Secondary | ICD-10-CM | POA: Diagnosis not present

## 2018-08-13 DIAGNOSIS — B029 Zoster without complications: Secondary | ICD-10-CM | POA: Diagnosis not present

## 2018-08-13 DIAGNOSIS — J90 Pleural effusion, not elsewhere classified: Secondary | ICD-10-CM | POA: Diagnosis not present

## 2018-08-13 DIAGNOSIS — Z7189 Other specified counseling: Secondary | ICD-10-CM | POA: Diagnosis not present

## 2018-08-13 DIAGNOSIS — I6782 Cerebral ischemia: Secondary | ICD-10-CM | POA: Diagnosis not present

## 2018-08-13 DIAGNOSIS — G319 Degenerative disease of nervous system, unspecified: Secondary | ICD-10-CM | POA: Diagnosis not present

## 2018-08-13 DIAGNOSIS — I10 Essential (primary) hypertension: Secondary | ICD-10-CM | POA: Diagnosis not present

## 2018-08-13 DIAGNOSIS — Z515 Encounter for palliative care: Secondary | ICD-10-CM | POA: Diagnosis not present

## 2018-08-13 DIAGNOSIS — Z91013 Allergy to seafood: Secondary | ICD-10-CM | POA: Diagnosis not present

## 2018-08-13 DIAGNOSIS — N390 Urinary tract infection, site not specified: Secondary | ICD-10-CM | POA: Diagnosis not present

## 2018-08-13 DIAGNOSIS — Z881 Allergy status to other antibiotic agents status: Secondary | ICD-10-CM | POA: Diagnosis not present

## 2018-08-13 DIAGNOSIS — G939 Disorder of brain, unspecified: Secondary | ICD-10-CM | POA: Diagnosis not present

## 2018-08-13 DIAGNOSIS — Z79899 Other long term (current) drug therapy: Secondary | ICD-10-CM | POA: Diagnosis not present

## 2018-08-13 DIAGNOSIS — H3530 Unspecified macular degeneration: Secondary | ICD-10-CM | POA: Diagnosis not present

## 2018-08-17 ENCOUNTER — Other Ambulatory Visit: Payer: Self-pay | Admitting: Cardiovascular Disease

## 2018-08-20 ENCOUNTER — Telehealth: Payer: Self-pay | Admitting: Cardiovascular Disease

## 2018-08-20 NOTE — Telephone Encounter (Signed)
Could not leave a message on cell phone as voicemail is not set up.  Home phone is constantly busy.

## 2018-08-20 NOTE — Telephone Encounter (Signed)
Pt overdue for 12 month f/u. Please contact pt for future appointment. Pt needing refills. 

## 2018-08-21 DIAGNOSIS — H35033 Hypertensive retinopathy, bilateral: Secondary | ICD-10-CM | POA: Diagnosis not present

## 2018-08-21 DIAGNOSIS — H43813 Vitreous degeneration, bilateral: Secondary | ICD-10-CM | POA: Diagnosis not present

## 2018-08-21 DIAGNOSIS — H353231 Exudative age-related macular degeneration, bilateral, with active choroidal neovascularization: Secondary | ICD-10-CM | POA: Diagnosis not present

## 2018-08-22 ENCOUNTER — Other Ambulatory Visit: Payer: Self-pay | Admitting: Cardiovascular Disease

## 2018-08-29 ENCOUNTER — Telehealth: Payer: Self-pay

## 2018-08-29 NOTE — Telephone Encounter (Signed)
Fax from [facility] for refill of Tramadol.  Call to pt.  Pt states she is still pt at [facility] but hasn't been in because she's been "as healthy as a horse".  Tramadol is for back pain but she believes it was supposed to be faxed to another provider.  Discussed need for CPE, labs, appt prior to any refills form [place].  Pt will c/b to schedule once she has upcoming eval of her eye.  Discarding refill request.

## 2018-09-18 ENCOUNTER — Encounter (HOSPITAL_COMMUNITY): Payer: Self-pay | Admitting: Emergency Medicine

## 2018-09-18 ENCOUNTER — Ambulatory Visit (HOSPITAL_COMMUNITY)
Admission: EM | Admit: 2018-09-18 | Discharge: 2018-09-18 | Disposition: A | Discharge status: Home / Self Care | Payer: Medicare Other | Attending: Family Medicine | Admitting: Family Medicine

## 2018-09-18 ENCOUNTER — Other Ambulatory Visit: Payer: Self-pay

## 2018-09-18 DIAGNOSIS — M545 Low back pain, unspecified: Secondary | ICD-10-CM

## 2018-09-18 MED ORDER — MELOXICAM 7.5 MG PO TABS: 7.5000 mg | ORAL_TABLET | Freq: Every day | ORAL | 0 refills | Status: AC

## 2018-09-18 MED ORDER — TRAMADOL HCL 50 MG PO TABS: 50.0000 mg | ORAL_TABLET | Freq: Four times a day (QID) | ORAL | 0 refills | Status: DC | PRN

## 2018-09-18 MED ORDER — DEXAMETHASONE SODIUM PHOSPHATE 10 MG/ML IJ SOLN
10.0000 mg | Freq: Once | INTRAMUSCULAR | Status: AC
  Administered 2018-09-18: 10 mg via INTRAMUSCULAR

## 2018-09-18 MED ORDER — DEXAMETHASONE SODIUM PHOSPHATE 10 MG/ML IJ SOLN
INTRAMUSCULAR | Status: AC
  Filled 2018-09-18: qty 1

## 2018-09-18 MED ORDER — MELOXICAM 7.5 MG PO TABS: 7.5000 mg | ORAL_TABLET | Freq: Every day | ORAL | 0 refills | Status: DC

## 2018-09-18 NOTE — ED Triage Notes (Addendum)
Complains of pain from mid back to low back.  This is chronic pain.  Patient says it is worse today.  Says she cannot stand it any longer.  Patient has a history of falls.  [facility]

## 2018-09-18 NOTE — ED Provider Notes (Signed)
[facility]    CSN: [id number] Arrival date & time: 09/18/18  1525      History   Chief Complaint Chief Complaint  Patient presents with  . Back Pain    HPI Julie BelfastBarbara E Nusser is a 78 y.o. female history of hypertension, osteopenia, presenting today for evaluation of back pain.  Patient states that she has had chronic back pain for a while, but of recently her pain has worsened and has become less bearable.  She typically uses topical rubs and Aspercreme.  She recently went to primary care at [facility] on 9/4 and had T-spine and L-spine notable for compression fractures in T-spine as well as at L2.  States that she has the pain bilaterally and will radiate into bilateral hips and legs.  Denies issues controlling urination or bowel movements.  Denies numbness or tingling.  Denies any recent falls.  States that her last fall which she believes caused the fractures was in 2016.  HPI  Past Medical History:  Diagnosis Date  . Allergy   . Anemia   . Asthma   . Cataract   . Hypertension   . Palpitations     Patient Active Problem List   Diagnosis Date Noted  . Hyperlipidemia 02/28/2017  . Oropharyngeal dysphagia 10/04/2016  . Other bursal cyst, left elbow 10/04/2016  . Pill esophagitis 10/04/2016  . Infected insect bite 06/29/2016  . Retained foreign body in soft tissue 06/29/2016  . Essential hypertension 11/18/2012  . Palpitations 11/18/2012    Past Surgical History:  Procedure Laterality Date  . APPENDECTOMY    . CHOLECYSTECTOMY    . DOPPLER ECHOCARDIOGRAPHY  2011  . EYE SURGERY    . TUBAL LIGATION      OB History   No obstetric history on file.      Home Medications    Prior to Admission medications   Medication Sig Start Date End Date Taking? Authorizing Provider  aspirin EC 81 MG tablet Take 81 mg by mouth 2 (two) times daily.    Yes [provider]  metoprolol tartrate (LOPRESSOR) 25 MG tablet TAKE 1/2 TABLET(12.5 MG) BY MOUTH TWICE DAILY  06/06/18  Yes Runell GessBerry, Jonathan J, MD  Multiple Vitamins-Minerals (PRESERVISION AREDS 2) CAPS Take 1 capsule by mouth 2 (two) times daily.   Yes [provider]  irbesartan (AVAPRO) 150 MG tablet TAKE 1 TABLET BY MOUTH DAILY Patient taking differently: Patient not sure 08/22/18   Runell GessBerry, Jonathan J, MD  loratadine (CLARITIN) 10 MG tablet Take 10 mg by mouth daily as needed for allergies. Patient does not know    [provider]  MAGNESIUM PO Take 1 tablet by mouth daily.    [provider]  meloxicam (MOBIC) 7.5 MG tablet Take 1 tablet (7.5 mg total) by mouth daily for 10 days. Take in the morning, with food. 09/18/18 09/28/18  Wieters, Hallie C, PA-C  traMADol (ULTRAM) 50 MG tablet Take 1 tablet (50 mg total) by mouth every 6 (six) hours as needed for severe pain. 09/18/18   Wieters, Hallie C, PA-C  atorvastatin (LIPITOR) 20 MG tablet TAKE 1 TABLET BY MOUTH DAILY 04/16/18 09/18/18  Runell GessBerry, Jonathan J, MD    Family History Family History  Problem Relation Age of Onset  . Alzheimer's disease Mother   . Heart attack Father   . Heart disease Brother   . Diabetes Son   . Sensorineural hearing loss Sister   . Heart disease Brother   . Heart disease Brother   .  Thyroid disease Brother     Social History Social History   Tobacco Use  . Smoking status: Never Smoker  . Smokeless tobacco: Never Used  Substance Use Topics  . Alcohol use: Not on file  . Drug use: Not on file     Allergies   Shellfish allergy, Demerol, Doxycycline hyclate, and Sulfa antibiotics   Review of Systems Review of Systems  Constitutional: Negative for fatigue and fever.  Eyes: Negative for visual disturbance.  Respiratory: Negative for shortness of breath.   Cardiovascular: Negative for chest pain.  Gastrointestinal: Negative for abdominal pain, nausea and vomiting.  Musculoskeletal: Positive for back pain. Negative for arthralgias and joint swelling.  Skin: Negative for color change, rash and  wound.  Neurological: Negative for dizziness, weakness, light-headedness and headaches.     Physical Exam Triage Vital Signs ED Triage Vitals  Enc Vitals Group     BP 09/18/18 1613 (!) 184/87     Pulse Rate 09/18/18 1613 87     Resp 09/18/18 1613 (!) 24     Temp 09/18/18 1613 100 F (37.8 C)     Temp Source 09/18/18 1613 Temporal     SpO2 09/18/18 1613 98 %     Weight --      Height --      Head Circumference --      Peak Flow --      Pain Score 09/18/18 1607 10     Pain Loc --      Pain Edu? --      Excl. in [place]? --    No data found.  Updated Vital Signs BP (!) 186/81 (BP Location: Left Arm) Comment: small cuff  Pulse 87   Temp 100 F (37.8 C) (Temporal)   Resp (!) 24   SpO2 98%   BP rechecked 181/82  Visual Acuity Right Eye Distance:   Left Eye Distance:   Bilateral Distance:    Right Eye Near:   Left Eye Near:    Bilateral Near:     Physical Exam Vitals signs and nursing note reviewed.  Constitutional:      Appearance: She is well-developed.     Comments: No acute distress; thin elderly female  HENT:     Head: Normocephalic and atraumatic.     Nose: Nose normal.  Eyes:     Conjunctiva/sclera: Conjunctivae normal.  Neck:     Musculoskeletal: Neck supple.  Cardiovascular:     Rate and Rhythm: Normal rate.  Pulmonary:     Effort: Pulmonary effort is normal. No respiratory distress.  Abdominal:     General: There is no distension.  Musculoskeletal: Normal range of motion.     Comments: Tenderness diffusely throughout bilateral lower thoracic and lumbar spine, no specific midline tenderness  Full active range of motion of upper and lower extremities Strength at hips 5/5 and equal bilaterally in all directions Knee strength 5/5 and equal bilaterally  Skin:    General: Skin is warm and dry.  Neurological:     Mental Status: She is alert and oriented to person, place, and time.      UC Treatments / Results  Labs (all labs ordered are listed,  but only abnormal results are displayed) Labs Reviewed - No data to display  EKG   Radiology No results found. DG Lumbar Spine Complete CLINICAL DATA:  78 year old female with 5 days of back pain which increases with activity. Tender to palpation over the lower thoracic region.  EXAM: LUMBAR SPINE -  COMPLETE 4+ VIEW  COMPARISON:  Thoracic radiographs today reported separately.  FINDINGS: Normal lumbar segmentation. There is a moderate L2 superior endplate compression fracture with mild endplate sclerosis. Minimal retropulsion suspected. The remaining lumbar vertebral bodies appear intact. Underlying osteopenia. Grossly intact sacral ala and SI joints. Calcified aortic atherosclerosis. Cholecystectomy clips. Negative visible bowel gas pattern.  IMPRESSION: 1. Age indeterminate moderate L2 compression fracture with minimal retropulsion. If specific therapy such as vertebroplasty is desired, Lumbar MRI or Nuclear Medicine Whole-body Bone Scan would best determine acuity. 2. Osteopenia.  Calcified aortic atherosclerosis.  Electronically Signed   By: Julie Durham M.D.   On: 09/12/2017 17:34 DG Thoracic Spine 2 View CLINICAL DATA:  78 year old female with 5 days of back pain which increases with activity. Tender to palpation over the lower thoracic region.  EXAM: THORACIC SPINE 2 VIEWS  COMPARISON:  Lumbar radiographs today reported separately.  FINDINGS: Thoracic segmentation appears normal. There are multiple thoracic compression fractures noted at T6, T8, and to a lesser extent T9. There is also an L2 compression fracture, reported separately. Underlying osteopenia. Exaggerated thoracic kyphosis. Relatively preserved thoracic disc spaces.  Grossly intact posterior ribs and negative thoracic visceral contours aside from evidence of left atrial enlargement. Calcified aortic atherosclerosis. Cholecystectomy clips in the right upper quadrant. Negative visible bowel gas  pattern.  IMPRESSION: 1. Osteopenia with multilevel thoracic compression fractures that are age indeterminate: T6, T8, T9. If specific therapy such as vertebroplasty is desired, thoracic MRI or Nuclear Medicine Whole-body Bone Scan would best determine acuity. 2.  Aortic Atherosclerosis (ICD10-I70.0).  Electronically Signed   By: Julie Durham M.D.   On: 09/12/2017 17:33   Procedures Procedures (including critical care time)  Medications Ordered in UC Medications  dexamethasone (DECADRON) injection 10 mg (has no administration in time range)    Initial Impression / Assessment and Plan / UC Course  I have reviewed the triage vital signs and the nursing notes.  Pertinent labs & imaging results that were available during my care of the patient were reviewed by me and considered in my medical decision making (see chart for details).     Given history of osteopenia, will defer course of steroids and provide one-time dose of Decadron in clinic.  Will continue on Mobic 7.5 mg daily.  Provided tramadol to use for severe pain.  Advised to follow-up with orthopedics for further monitoring of compression fractures.  No neuro deficits.Discussed strict return precautions. Patient verbalized understanding and is agreeable with plan.  Final Clinical Impressions(s) / UC Diagnoses   Final diagnoses:  Acute bilateral low back pain without sciatica     Discharge Instructions     We gave you a shot of Decadron (steroid) Continue with mobic daily with food for 10 days May use tramadol for severe pain/night time pain- will cause drowsiness  Follow up with orthopedics for further monitoring of fractures of spine    ED Prescriptions    Medication Sig Dispense Auth. Provider   meloxicam (MOBIC) 7.5 MG tablet Take 1 tablet (7.5 mg total) by mouth daily for 10 days. Take in the morning, with food. 10 tablet Wieters, Hallie C, PA-C   traMADol (ULTRAM) 50 MG tablet Take 1 tablet (50 mg total) by  mouth every 6 (six) hours as needed for severe pain. 12 tablet Wieters, [place] C, PA-C     Controlled Substance Prescriptions [place] Controlled Substance Registry consulted? Not Applicable   Lew Dawes, [place] 09/18/18 1707

## 2018-09-18 NOTE — Discharge Instructions (Signed)
We gave you a shot of Decadron (steroid) Continue with mobic daily with food for 10 days May use tramadol for severe pain/night time pain- will cause drowsiness  Follow up with orthopedics for further monitoring of fractures of spine

## 2018-09-26 DIAGNOSIS — M545 Low back pain: Secondary | ICD-10-CM | POA: Diagnosis not present

## 2018-09-26 DIAGNOSIS — M4856XA Collapsed vertebra, not elsewhere classified, lumbar region, initial encounter for fracture: Secondary | ICD-10-CM | POA: Diagnosis not present

## 2018-10-02 DIAGNOSIS — H353231 Exudative age-related macular degeneration, bilateral, with active choroidal neovascularization: Secondary | ICD-10-CM | POA: Diagnosis not present

## 2018-10-05 DIAGNOSIS — M545 Low back pain: Secondary | ICD-10-CM | POA: Diagnosis not present

## 2018-10-11 DIAGNOSIS — M545 Low back pain: Secondary | ICD-10-CM | POA: Diagnosis not present

## 2018-10-16 ENCOUNTER — Other Ambulatory Visit: Payer: Self-pay | Admitting: Neurosurgery

## 2018-10-16 ENCOUNTER — Other Ambulatory Visit: Payer: Self-pay | Admitting: Radiation Therapy

## 2018-10-16 DIAGNOSIS — S32010A Wedge compression fracture of first lumbar vertebra, initial encounter for closed fracture: Secondary | ICD-10-CM | POA: Diagnosis not present

## 2018-10-16 DIAGNOSIS — S32000A Wedge compression fracture of unspecified lumbar vertebra, initial encounter for closed fracture: Secondary | ICD-10-CM | POA: Diagnosis not present

## 2018-10-16 DIAGNOSIS — S32040A Wedge compression fracture of fourth lumbar vertebra, initial encounter for closed fracture: Secondary | ICD-10-CM | POA: Diagnosis not present

## 2018-10-16 DIAGNOSIS — G939 Disorder of brain, unspecified: Secondary | ICD-10-CM | POA: Diagnosis not present

## 2018-10-17 ENCOUNTER — Telehealth: Payer: Self-pay | Admitting: Cardiovascular Disease

## 2018-10-17 NOTE — Telephone Encounter (Signed)
I s/w pt to let her know she will need an appt before she can be cleared for her surgery on 10/22/18. Pt asked for [place] to please reach out to her daughter Leverne Humbles. I tried x 3 to reach [employer] though a loud static comes on the line. I hung up then tried the pt's son who is also on the DPR, Darden Dates. I left a message to call back to schedule an appt. I was calling to offer Melina Copa, [facility] 10/21/18 @ either 9:30 am or 1:30 pm. This is a Dr. Gwenlyn Found pt though there are no openings at the [facility] office.

## 2018-10-17 NOTE — Telephone Encounter (Signed)
° ° °  ° °  [facility] Pre-operative Risk Assessment    Request for surgical clearance:  1. What type of surgery is being performed? KYPHOPLASTY LUMBAR 1, LUMBAR 4  2. When is this surgery scheduled? 10/22/18  3. What type of clearance is required (medical clearance vs. Pharmacy clearance to hold med vs. Both)? BOTH  4. Are there any medications that need to be held prior to surgery and how long? 5. Practice name and name of physician performing surgery? Consuella Lose, MD  6. What is your office phone number [phone] [phone]   7.   What is your office fax number [phone]  8.   Anesthesia type (None, local, MAC, general) ? GENERAL   Laurier Nancy 10/17/2018, 3:08 PM  _________________________________________________________________   (provider comments below)

## 2018-10-17 NOTE — Telephone Encounter (Signed)
   Primary Cardiologist:Jonathan Gwenlyn Found, MD  Chart reviewed as part of pre-operative protocol coverage. Because of Julie Durham's past medical history and time since last visit, he/she will require a follow-up visit in order to better assess preoperative cardiovascular risk.  Pre-op covering staff: - Please schedule appointment and call patient to inform them. - Please contact requesting surgeon's office via preferred method (i.e, phone, fax) to inform them of need for appointment prior to surgery.  If applicable, this message will also be routed to pharmacy pool and/or primary cardiologist for input on holding anticoagulant/antiplatelet agent as requested below so that this information is available at time of patient's appointment.   Kathyrn Drown, NP  10/17/2018, 3:27 PM

## 2018-10-18 ENCOUNTER — Other Ambulatory Visit: Payer: Self-pay

## 2018-10-18 ENCOUNTER — Other Ambulatory Visit: Payer: Self-pay | Admitting: Neurosurgery

## 2018-10-18 ENCOUNTER — Encounter (HOSPITAL_COMMUNITY): Payer: Self-pay | Admitting: *Deleted

## 2018-10-18 NOTE — Progress Notes (Signed)
Spoke with pt's daughter, Eustaquio Maize for pre-op call. DPR on file. Pt sees Dr. Gwenlyn Found for HTN and "palpitations". Will see on of the PA's on Monday for surgery clearance. [name] states pt is not diabetic. Pt stopped her Aspirin on 10/15/18.   Pt will go for her Covid test on Monday, 10/21/18 at 1:00 PM. Instructed [name] about quarantine.  [name] was made aware of visitation policy.

## 2018-10-18 NOTE — Telephone Encounter (Signed)
Follow Up  Appointment for preop clearance has been scheduled with Dayna Dunn PA-C on 10/21/18 at 1:30 pm at [facility].

## 2018-10-18 NOTE — Progress Notes (Addendum)
Anesthesia Chart Review: SAME DAY WORK-UP   Case: [id number] Date/Time: 10/22/18 0715   Procedure: KYPHOPLASTY LUMBAR 1, LUMBAR 4 (Bilateral ) - KYPHOPLASTY LUMBAR 1, LUMBAR 4   Anesthesia type: General   Pre-op diagnosis: COMPRESSION FRACTURE OF LUMBAR 1 VERTEBRA   Location: [facility] OR ROOM 19 / [facility]   Surgeon: Lisbeth Renshaw, MD      DISCUSSION: Patient is a 78 year old female scheduled for the above procedure.  History includes never smoker, HTN, asthma, anemia, arthritis, palpitations.  - ED visit 09/18/18 for back pain.  - Hospitalization 08/10/18-08/13/18 at [facility] for acute delirium with LUE rash--likely prodrome VZV.  LP done, cultures negative, but positive for Varicella Zoster Virus (VZV) DNA and Cryptococcus neoformans/gattii detected (Cryptococcal Antigen negative 08/10/18 and 08/11/18, so thought to be a false Cryptococcal positive). She had been treated with acyclovir for the rash and started on ceftriaxone empirically for UTI--Urine culture negative.  Mild encephalopathy on EEG. 08/11/18 brain MRI 08/11/18 showed a 1.0 x 1.0 x 1.5 cm mass of the posterior medulla oblongata and proximal cervical spinal cord concerning for a primary CNS neoplasm such as an ependymoma, oligodendroglioma, or astrocytoma. (CSF showed occasional monocytes, negative for malignancy 08/11/18.). She was referred for out-patient Neurosurgery evaluation. Discharged on Valtrex for further VZV treatment.By discharge summary, patient refused to be discharged to her son or daughter's homes and her daughter said there was no room for anyone to stay at her mom's due to "hoarder" behavior. Psychiatry apparently had to be consulted for competency evaluation and felt patient was able to make her own decisions.   Last ASA 10/15/18. Patient is being seen on 10/21/18 for cardiology pre-operative evaluation. COVID-19 pre-procedure test is also scheduled for 10/21/18. Will follow-up.   ADDENDUM 10/21/18 2:28  PM: Patient seen by Marjie Skiff, PA-C at [facility] for follow-up and preoperative evaluation. EKG done. Note is not yet signed, but preliminary details state, "Patient has had worsening chronic back pain and is lumbar kyphoplasty on 10/22/2018.  - Per Revised Cardiac Risk Index, considered very low risk for MACE. Based on ACC/AHA guidelines, patient would be at acceptable risk for the planned procedure without further cardiovascular testing. I will route this recommendation to the requesting party via Epic fax function. OK to hold Aspirin as needed prior to procedure." A follow-up echo is planned to re-evaluate for valvular disease, elevated pulmonary pressures (last done in 2011), "but this does not need to be done prior to surgery."    PROVIDERS: Patient, No Pcp Per Nanetta Batty, MD is cardiologist   LABS: She will need updated labs prior to surgery. According to labs in [facility], as of 08/13/18, H/H 11.6/34.9, PLT 237, K 3.1, Cr 0.30, Ca 8.5, ALT 12, AST 15. A1c 5.0% on 08/11/18.    OTHER: EEG (awake & drowsy) 08/12/18 ([facility]): - INTERPRETATION: This is an abnormal EEG demonstrating continuous and generalized slowing of the background in the theta frequency. - CLINICAL CORRELATION: Findings indicate a mild encephalopathy (diffuse or multifocal cerebral dysfunction), which may be attributable to toxic, metabolic, infectious, and/or vascular etiologies.   IMAGES: CT Chest 08/12/18 ([facility]): IMPRESSION: 1. No source of primary malignancy. 2.  Old granulomatous disease. 3. Trace left pleural effusion. 4. Sigmoid diverticulosis without diverticulitis. 5. Tiny bilateral renal cysts. 6. Multilevel thoracolumbar vertebral body compression deformities likely remote. Addendum by Modena Jansky, MD on 08/12/2018  3:13 PM Small pancreatic cyst measuring up to 8 mm in the uncinate process. Recommend contrast enhanced  MRI in 2 years [employer]  Recommendations, 2017).   MRI Head 08/11/18 ([facility]): IMPRESSION: 1. 1.0 x 1.0 x 1.5 cm heterogeneously enhancing exophytic intramedullary mass of the posterior junction of the medulla oblongata and proximal cervical spinal cord, with partial extension of the mass into the distal cerebral aqueduct. 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 2. No convincing evidence for hydrocephalus. 3. No acute intracranial abnormality is identified. 4. Mild cerebral atrophy. 5. Moderate small vessel ischemic disease changes.   Lumbar/Thoracic Spine Xray 09/12/17: IMPRESSION: 1. Age indeterminate moderate L2 compression fracture with minimal retropulsion. If specific therapy such as vertebroplasty is desired, Lumbar MRI or Nuclear Medicine Whole-body Bone Scan would best determine acuity. 2. Osteopenia.  Calcified aortic atherosclerosis.    EKG: Currently, last EKG is > 17 year old. Hopefully updated tracing at 10/21/18 cardiology visit.  UPDATE: According to 10/21/18 office note by Sande Rives, PA-C, EKG done that day showed: "normal sinus rhythm, rate 79 bpm, with some underlying artifact, LAFB,left axis deviation, and poor R wave progression but no acute ST/T changes compared to prior tracing."    CV: Nuclear stress test 05/01/12: Overall Impression:  Normal stress nuclear study. LV Wall Motion:  NL LV Function; NL Wall Motion. LVEF 92%.   Echo 07/19/09: Summary: LV systolic function is normal.  Proximal septal thickening is noted.  The transmitral septal Doppler flow pattern is suggestive of impaired LV relaxation. EF > 55%. Mild mitral annular calcification.  Mild mitral regurgitation. Mild tricuspid regurgitation.  RVSP is elevated at 30-40 mmHg. The aortic valve appears to be mildly sclerotic. Mild pulmonic valvular regurgitation.   Past Medical History:  Diagnosis Date  . Allergy   . Anemia   . Arthritis    . Asthma   . Cataract   . Hypertension   . Palpitations     Past Surgical History:  Procedure Laterality Date  . APPENDECTOMY    . CHOLECYSTECTOMY    . DOPPLER ECHOCARDIOGRAPHY  2011  . EYE SURGERY    . TUBAL LIGATION      MEDICATIONS: No current facility-administered medications for this encounter.    Marland Kitchen aspirin EC 81 MG tablet  . irbesartan (AVAPRO) 150 MG tablet  . loratadine (CLARITIN) 10 MG tablet  . MAGNESIUM PO  . metoprolol tartrate (LOPRESSOR) 25 MG tablet  . Multiple Vitamins-Minerals (PRESERVISION AREDS 2) CAPS  . traMADol (ULTRAM) 50 MG tablet    Myra Gianotti, PA-C Surgical Short Stay/Anesthesiology [facility] Phone [phone] [address] [facility] Phone [phone] 10/18/2018 5:51 PM

## 2018-10-18 NOTE — Telephone Encounter (Signed)
Pt has appt 10/12 with Melina Copa, PAC. I will route to PA for appt and remove from call back pool.

## 2018-10-19 NOTE — Progress Notes (Signed)
Cardiology Office Note:    Date:  10/21/2018   ID:  Julie Durham, DOB December 15, 1940, MRN [id number]  PCP:  Patient, No Pcp Per  Cardiologist:  Julie Batty, MD  Electrophysiologist:  None   Referring MD: No ref. provider found   Chief Complaint: follow-up of hypertension and pre-operative evaluation  History of Present Illness:    Julie Durham is a 78 y.o. female with a history of palpitations, hypertension, hyperlipidemia, and strong family history of heart disease who is followed by Dr. Allyson Durham and presents today for follow-up.  Patient has been followed by Dr. Allyson Durham several years for hypertension, hypertension, and a strong family history of heart disease. Her father died from a MI at the age of 13. She has 2 brothers who had Mis in their 65s and 50s. She also has a sister with CAD with stents. Patient has never had a heart attack or stroke. Patient had a normal nuclear stress test in 2014. She has also had dopplers involving her renal arteries, carotids, and lower extremities all of which have been normal. She does have a history of tachypalpitations for which she was started on a beta-blocker. She last saw Dr. Allyson Durham in 02/2017 for follow-up at which time she remained completely asymptomatic. Her BP was elevated at that visit at 171/93. She was continued her home Enalapril and Metoprolol.  Patient was recently admitted to [facility] from 08/10/2018 to 08/13/2018 for acute delirium after presenting with altered mental status and hallucinations. On presentation, she was found to have a rash on her left hand extending up medial aspect of upper arm consistent with Shingles. She was started on IV Acyclovir.   Head CT showed no acute intracranial abnormalities. 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 She underwent lumbar puncture - protein was elevated at 50, glucose 65, essential 0 WBCs. Menigitis/encephalitis panel was postive for VZV and cryptococcus neoformans/Gatti (but repeat cryptococcal Ag was negative indicating a false positive. EEG showed mild encephalopathy could be attributable to toxic, metabolic, infections, and/or vascular etiologies. Patient was discharged on Valtrex and referred to neurosurgery.  Patient presents today for follow-up and pre-operative evaluation. Patient has been having significant  back pain that has been getting worse since the beginning of this year. She was found to have 2 stress fractures in her lower back. She is scheduled for a lumbar kyphoplasty on 10/22/2018. She done well from a cardiac standpoint since her last visit with Dr. Allyson Durham. She denies any chest pain, shortness of breath, lightheadedness, dizziness, syncope, orthopnea, PND, edema. Patient is not very active due to back pain. She walks around her house and denies any chest pain or shortness of breath but daughter states she does have some shortness of breath while ambulating. Up until the beginning of this year when her back pain started, she was still able to mow her lawn. Patient weighs 59 lbs in the office today down from 66 lbs in 08/2018. Patient states she has a large appetite but daughter disagrees. She eats protein bars but has never tried any of the protein/boost shakes. Her BP is very elevated today at 182/90 but she does not think she has taken her Irbesartan the last couple of days. No headaches, slurred speech, blurry vision, or unilateral weakness.  Past Medical History:  Diagnosis Date  Allergy    Anemia    Arthritis    Asthma    Brain mass    a. 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   Cataract     Encephalitis, viral    [facility]   Hypertension    Mild mitral regurgitation    a. Echo 2011: LVEF >55% w/ grade 1 DD, mild MR, mild TR, elevated RVSP 30-4540mmHg   Mild tricuspid regurgitation    Palpitations     Past Surgical History:  Procedure Laterality Date   APPENDECTOMY     CHOLECYSTECTOMY     DOPPLER ECHOCARDIOGRAPHY  2011   EYE SURGERY     TUBAL LIGATION      Current Medications: Current Meds  Medication Sig   acetaminophen (TYLENOL) 500 MG tablet Take 500 mg by mouth daily as needed.   metoprolol tartrate (LOPRESSOR) 25 MG tablet TAKE 1/2 TABLET(12.5 MG) BY MOUTH TWICE DAILY     Allergies:   Shellfish allergy, Demerol, Doxycycline hyclate, and Sulfa antibiotics   Social History   Socioeconomic History   Marital status: Legally Separated    Spouse name: Not on file   Number of children: Not on file   Years of education: Not on file   Highest education level: Not on file  Occupational History   Not on file  Social Needs   Financial resource strain: Not on file   Food insecurity    Worry: Not on file    Inability: Not on file   Transportation needs    Medical: Not on file    Non-medical: Not on file  Tobacco Use   Smoking status: Never Smoker   Smokeless tobacco: Never Used  Substance and Sexual Activity   Alcohol use: Never    Frequency: Never   Drug use: Never   Sexual activity: Not on file  Lifestyle   Physical activity    Days per week: Not on file    Minutes per session: Not on file   Stress: Not on file  Relationships   Social connections    Talks on phone: Not on file    Gets together: Not on file    Attends religious service: Not on file    Active member of club or organization: Not on file    Attends meetings of clubs or organizations: Not on file    Relationship status: Not on file  Other Topics Concern   Not on file  Social History Narrative   Not on file       Family History: The patient's family history includes Alzheimer's disease in her mother; Diabetes in her son; Heart attack in her father; Heart disease in her brother, brother, and brother; Sensorineural hearing loss in her sister; Thyroid disease in her brother.  ROS:   Please see the history of present illness.    All other systems reviewed and are negative.  EKGs/Labs/Other Studies Reviewed:    The following studies were reviewed today:  Exercise Myoview 05/01/2012: Impression Exercise Capacity:  Good exercise capacity. BP Response:  Normal blood pressure response. Clinical Symptoms:  No significant symptoms noted. ECG Impression:  No significant ST segment change suggestive of ischemia. Comparison with Prior Nuclear Study: No significant change from previous study  Overall Impression:  Normal stress nuclear study.  EKG:  EKG  ordered today. EKG was personally reviewed and demonstrates normal sinus rhythm, rate 79 bpm, with some underlying artifact, LAFB,left axis deviation, and poor R wave progression but no acute ST/T changes compared to prior tracing.  Recent Labs: No results found for requested labs within last 8760 hours.  Recent Lipid Panel    Component Value Date/Time   CHOL 126 03/01/2017 0921   TRIG 103 03/01/2017 0921   HDL 58 03/01/2017 0921   CHOLHDL 2.2 03/01/2017 0921   CHOLHDL 2.7 03/01/2016 0826   VLDL 20 03/01/2016 0826   LDLCALC 47 03/01/2017 0921    Physical Exam:    Vital Signs:  BP (!) 182/90    Ht 4\' 10"  (1.473 m)    Wt 59 lb (26.8 kg)    SpO2 98%    BMI 12.33 kg/m     Wt Readings from Last 3 Encounters:  10/21/18 59 lb (26.8 kg)  09/12/17 77 lb 3.2 oz (35 kg)  05/21/17 79 lb 6.4 oz (36 kg)    General: 78 y.o. extremely thin and frail female  in no acute distress. HEENT: Normocephalic and atraumatic. Sclera clear.  Neck: Supple. No carotid bruits. No JVD. Heart: RRR. Distinct S1 and S2. No murmurs, gallops, or rubs. Radial pulses and  posterior tibial pulses 2+ and equal bilaterally. Lungs: No increased work of breathing. Clear to ausculation bilaterally. No wheezes, rhonchi, or rales.  Abdomen: Soft, non-distended, and non-tender to palpation. Bowel sounds present. MSK: Normal strength and tone for age. Extremities: No clubbing, cyanosis, or edema.    Skin: Warm and dry. Neuro: Alert and oriented x3. No focal deficits. Psych: Normal affect. Responds appropriately.   ASSESSMENT:    1. Pre-op evaluation   2. Mitral valve insufficiency, unspecified etiology   3. Tricuspid valve insufficiency, unspecified etiology   4. Essential hypertension   5. Hyperlipidemia, unspecified hyperlipidemia type   6. Hypokalemia   7. Anemia, unspecified type   8. Adult BMI <19 kg/sq m   9. Brain mass    PLAN:    Pre-Operative Evaluation - Patient has had worsening chronic back pain and is lumbar kyphoplasty on 10/22/2018.  - Difficult to truly assess current functional status given back pain; however, patient states she was able to cut her own grass before back pain started at the beginning of this year.  Per Revised Cardiac Risk Index, considered very low risk for MACE.Based on ACC/AHA guidelines, patient would be at acceptable risk for the planned procedure without further cardiovascular testing. I will route this recommendation to the requesting party via Epic fax function. OK to hold Aspirin as needed prior to procedure.  Mild Mitral/Tricuspid/Pulmonic Valve Regurgitation - Noted on Echo in 2011. - Will recheck Echo but this does not need to be done prior to surgery.   Elevated Pulmonary Pressures - RVSP elevated at 30-40 mmHg on Echo in 2011.  - Will recheck Echo as above.  Hypertension - BP very elevated at 182/90 today. I personally rechecked and confirmed this. Patient asymptomatic with this. No stroke symptoms. - On Irbesartan 150mg  dialy and Lopressor 12.5mg  twice daily at home. However, she does not think she has any  Irbesartan at home and does not think she has taken it the last several days.  - Daughter is going to go and check her pill box and let us know. If patient has not taken Irbesartan today, she was instructed to take this when she gets home. If she has taken Irbesartan, will likely need to add  Amlodipine - given how thin and frail she is would start at 2.5mg  daily. - Also recommended purchasing new BP cuff at home and keeping BP log.  Hyperlipidemia - Most recent lipid panel from 02/2017: Total Cholesterol 126, Triglycerides 103, HDL 58, LDL 47. - Patient previously on Lipitor 20mg  daily but states she no longer takes this. Unclear exactly why. - Can recheck lipids at next visit if fasting.  Hypokalemia - Potassium 3.1 on discharge from [facility] in 08/2018. - Will recheck BMET and Magnesium today.  Anemia - Hemoglobin 11.6 on discharge from [facility] in 08/2018. - Will recheck CBC today.   BMI 12.33 - Patient weighs 59 lbs today. She states she has always been very thin but it does appear that she has been losing weight recently. - Recommend protein/Boost shakes with each meal.   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 - Patient followed up with Neurosurgery who is planning on repeat brain MRI next month.  Social Concerns - Patient lives alone and takes care of all of her medications by herself. Daughter thinks patient would benefit from assistance but states no one would be able to get in her house as she is a 09/2018.  - Patient also does not have a PCP and has been using an Urgent Care as her primary doctor. Discussed the importance of a PCP and daughter stated "you are not going to win this battle." Gave number for hotline to find local PCP in [place]. - I think patient would benefit  from remote health referral to assist with medication reconciliation, vitals check, and assessment of social concerns. When we call with lab results, we will see if patient/daughter are agreeable to this.    Disposition: Follow up with Dr. Chartered loss adjuster in 2-3 months. If patient not agreeable to remote health referral, will need to come back in a couple of weeks for BP check.  Medication Adjustments/Labs and Tests Ordered: Current medicines are reviewed at length with the patient today.  Concerns regarding medicines are outlined above.  Orders Placed This Encounter  Procedures   Basic metabolic panel   CBC with Differential/Platelet   Magnesium   EKG 12-Lead   Meds ordered this encounter  Medications   irbesartan (AVAPRO) 150 MG tablet    Sig: Take 1 tablet (150 mg total) by mouth daily.    Dispense:  90 tablet    Refill:  3    Patient needs to make an appointment for future refills  **Patient requests 90 days supply**    Patient Instructions  Medication Instructions:  Your physician recommends that you continue on your current medications as directed. Please refer to the Current Medication list given to you today. Check and see if you took the Irbesartan today for blood pressure.  If not, then please take 1 today.  A new prescription has been [facility] into [facility].   Monitor your blood pressure and contact the office if it is still running higher than 130/80.  If you need a refill on your cardiac medications before your next appointment, please call your pharmacy.   Lab work: TODAY:  BMET, CBC, & MAG  If you have labs (blood work) drawn today and your tests are completely normal, you will receive your results only by:  MyChart Message (if you have MyChart) OR  A  paper copy in the mail If you have any lab test that is abnormal or we need to change your treatment, we will call you to review the results.  Testing/Procedures: None ordered  Follow-Up: At [facility], you and your  health needs are our priority.  As part of our continuing mission to provide you with exceptional heart care, we have created designated Provider Care Teams.  These Care Teams include your primary Cardiologist (physician) and Advanced Practice Providers (APPs -  Physician Assistants and Nurse Practitioners) who all work together to provide you with the care you need, when you need it. You will need a follow up appointment in 3 months.  Please call our office 2 months in advance to schedule this appointment.  You may see Julie Batty, MD or one of the following Advanced Practice Providers on your designated Care Team:   Corine Shelter, PA-C Judy Pimple, PA-C  Marjie Skiff, [place]  Any Other Special Instructions Will Be Listed Below (If Applicable).  You can call [phone] and find a Primary Care Physician    Signed, Corrin Parker, PA-C  10/21/2018 5:34 PM    [facility]

## 2018-10-20 ENCOUNTER — Encounter: Payer: Self-pay | Admitting: Physician Assistant

## 2018-10-21 ENCOUNTER — Other Ambulatory Visit: Payer: Self-pay

## 2018-10-21 ENCOUNTER — Other Ambulatory Visit (HOSPITAL_COMMUNITY)
Admission: RE | Admit: 2018-10-21 | Discharge: 2018-10-21 | Disposition: A | Discharge status: Home / Self Care | Payer: Medicare Other | Source: Ambulatory Visit | Attending: Neurosurgery | Admitting: Neurosurgery

## 2018-10-21 ENCOUNTER — Encounter: Payer: Self-pay | Admitting: Physician Assistant

## 2018-10-21 ENCOUNTER — Ambulatory Visit: Payer: Medicare Other | Admitting: Student

## 2018-10-21 VITALS — BP 182/90 | Ht <= 58 in | Wt <= 1120 oz

## 2018-10-21 DIAGNOSIS — I214 Non-ST elevation (NSTEMI) myocardial infarction: Secondary | ICD-10-CM | POA: Diagnosis not present

## 2018-10-21 DIAGNOSIS — I10 Essential (primary) hypertension: Secondary | ICD-10-CM

## 2018-10-21 DIAGNOSIS — Z681 Body mass index (BMI) 19 or less, adult: Secondary | ICD-10-CM

## 2018-10-21 DIAGNOSIS — I2510 Atherosclerotic heart disease of native coronary artery without angina pectoris: Secondary | ICD-10-CM | POA: Diagnosis not present

## 2018-10-21 DIAGNOSIS — D649 Anemia, unspecified: Secondary | ICD-10-CM | POA: Diagnosis not present

## 2018-10-21 DIAGNOSIS — E876 Hypokalemia: Secondary | ICD-10-CM

## 2018-10-21 DIAGNOSIS — Z01818 Encounter for other preprocedural examination: Secondary | ICD-10-CM | POA: Diagnosis not present

## 2018-10-21 DIAGNOSIS — Z981 Arthrodesis status: Secondary | ICD-10-CM | POA: Diagnosis not present

## 2018-10-21 DIAGNOSIS — Z23 Encounter for immunization: Secondary | ICD-10-CM | POA: Diagnosis not present

## 2018-10-21 DIAGNOSIS — I519 Heart disease, unspecified: Secondary | ICD-10-CM | POA: Diagnosis not present

## 2018-10-21 DIAGNOSIS — Z888 Allergy status to other drugs, medicaments and biological substances status: Secondary | ICD-10-CM | POA: Diagnosis not present

## 2018-10-21 DIAGNOSIS — M4856XA Collapsed vertebra, not elsewhere classified, lumbar region, initial encounter for fracture: Secondary | ICD-10-CM | POA: Diagnosis not present

## 2018-10-21 DIAGNOSIS — I700 Atherosclerosis of aorta: Secondary | ICD-10-CM | POA: Diagnosis not present

## 2018-10-21 DIAGNOSIS — Z79899 Other long term (current) drug therapy: Secondary | ICD-10-CM | POA: Diagnosis not present

## 2018-10-21 DIAGNOSIS — M4856XD Collapsed vertebra, not elsewhere classified, lumbar region, subsequent encounter for fracture with routine healing: Secondary | ICD-10-CM | POA: Diagnosis not present

## 2018-10-21 DIAGNOSIS — Z9889 Other specified postprocedural states: Secondary | ICD-10-CM | POA: Diagnosis not present

## 2018-10-21 DIAGNOSIS — Z91013 Allergy to seafood: Secondary | ICD-10-CM | POA: Diagnosis not present

## 2018-10-21 DIAGNOSIS — I5181 Takotsubo syndrome: Secondary | ICD-10-CM | POA: Diagnosis not present

## 2018-10-21 DIAGNOSIS — E785 Hyperlipidemia, unspecified: Secondary | ICD-10-CM

## 2018-10-21 DIAGNOSIS — I071 Rheumatic tricuspid insufficiency: Secondary | ICD-10-CM

## 2018-10-21 DIAGNOSIS — Z882 Allergy status to sulfonamides status: Secondary | ICD-10-CM | POA: Diagnosis not present

## 2018-10-21 DIAGNOSIS — M8580 Other specified disorders of bone density and structure, unspecified site: Secondary | ICD-10-CM | POA: Diagnosis not present

## 2018-10-21 DIAGNOSIS — I340 Nonrheumatic mitral (valve) insufficiency: Secondary | ICD-10-CM

## 2018-10-21 DIAGNOSIS — Z8249 Family history of ischemic heart disease and other diseases of the circulatory system: Secondary | ICD-10-CM | POA: Diagnosis not present

## 2018-10-21 DIAGNOSIS — M4846XA Fatigue fracture of vertebra, lumbar region, initial encounter for fracture: Secondary | ICD-10-CM | POA: Diagnosis not present

## 2018-10-21 DIAGNOSIS — I351 Nonrheumatic aortic (valve) insufficiency: Secondary | ICD-10-CM | POA: Diagnosis not present

## 2018-10-21 DIAGNOSIS — J449 Chronic obstructive pulmonary disease, unspecified: Secondary | ICD-10-CM | POA: Diagnosis not present

## 2018-10-21 DIAGNOSIS — Z7982 Long term (current) use of aspirin: Secondary | ICD-10-CM | POA: Diagnosis not present

## 2018-10-21 DIAGNOSIS — G9389 Other specified disorders of brain: Secondary | ICD-10-CM

## 2018-10-21 DIAGNOSIS — Z20828 Contact with and (suspected) exposure to other viral communicable diseases: Secondary | ICD-10-CM | POA: Diagnosis not present

## 2018-10-21 DIAGNOSIS — M8088XA Other osteoporosis with current pathological fracture, vertebra(e), initial encounter for fracture: Secondary | ICD-10-CM | POA: Diagnosis not present

## 2018-10-21 DIAGNOSIS — S32010A Wedge compression fracture of first lumbar vertebra, initial encounter for closed fracture: Secondary | ICD-10-CM | POA: Diagnosis not present

## 2018-10-21 LAB — SARS CORONAVIRUS 2 (TAT 6-24 HRS): SARS Coronavirus 2: NEGATIVE

## 2018-10-21 MED ORDER — IRBESARTAN 150 MG PO TABS: 150.0000 mg | ORAL_TABLET | Freq: Every day | ORAL | 3 refills | Status: DC

## 2018-10-21 NOTE — Anesthesia Preprocedure Evaluation (Addendum)
Anesthesia Evaluation  Patient identified by MRN, date of birth, ID band Patient awake    Reviewed: Allergy & Precautions, NPO status , Patient's Chart, lab work & pertinent test results  Airway Mallampati: II  TM Distance: >3 FB Neck ROM: Full    Dental no notable dental hx. (+) Teeth Intact, Dental Advisory Given   Pulmonary neg pulmonary ROS,    Pulmonary exam normal breath sounds clear to auscultation       Cardiovascular hypertension, Pt. on medications and Pt. on home beta blockers Normal cardiovascular exam+ Valvular Problems/Murmurs MR  Rhythm:Regular Rate:Normal     Neuro/Psych negative neurological ROS  negative psych ROS   GI/Hepatic negative GI ROS, Neg liver ROS,   Endo/Other  negative endocrine ROS  Renal/GU negative Renal ROS  negative genitourinary   Musculoskeletal negative musculoskeletal ROS (+)   Abdominal   Peds negative pediatric ROS (+)  Hematology negative hematology ROS (+)   Anesthesia Other Findings   Reproductive/Obstetrics negative OB ROS                           Anesthesia Physical Anesthesia Plan  ASA: III  Anesthesia Plan: General   Post-op Pain Management:    Induction: Intravenous  PONV Risk Score and Plan: 3 and Ondansetron, Dexamethasone and Treatment may vary due to age or medical condition  Airway Management Planned: Oral ETT  Additional Equipment:   Intra-op Plan:   Post-operative Plan: Extubation in OR  Informed Consent: I have reviewed the patients History and Physical, chart, labs and discussed the procedure including the risks, benefits and alternatives for the proposed anesthesia with the patient or authorized representative who has indicated his/her understanding and acceptance.     Dental advisory given  Plan Discussed with: CRNA and Surgeon  Anesthesia Plan Comments: (PAT note written by Myra Gianotti, PA-C. )       Anesthesia Quick Evaluation

## 2018-10-21 NOTE — Patient Instructions (Addendum)
Medication Instructions:  Your physician recommends that you continue on your current medications as directed. Please refer to the Current Medication list given to you today. Check and see if you took the Irbesartan today for blood pressure.  If not, then please take 1 today.  [facility]   Monitor your blood pressure and contact the office if it is still running higher than 130/80.  If you need a refill on your cardiac medications before your next appointment, please call your pharmacy.   Lab work: TODAY:  BMET, CBC, & MAG  If you have labs (blood work) drawn today and your tests are completely normal, you will receive your results only by: Marland Kitchen MyChart Message (if you have MyChart) OR . A paper copy in the mail If you have any lab test that is abnormal or we need to change your treatment, we will call you to review the results.  Testing/Procedures: None ordered  Follow-Up: At [facility], you and your health needs are our priority.  As part of our continuing mission to provide you with exceptional heart care, we have created designated Provider Care Teams.  These Care Teams include your primary Cardiologist (physician) and Advanced Practice Providers (APPs -  Physician Assistants and Nurse Practitioners) who all work together to provide you with the care you need, when you need it. You will need a follow up appointment in 3 months.  Please call our office 2 months in advance to schedule this appointment.  You may see Quay Burow, MD or one of the following Advanced Practice Providers on your designated Care Team:   Kerin Ransom, PA-C Roby Lofts, [place] . Sande Rives, PA-C  Any Other Special Instructions Will Be Listed Below (If Applicable).  You can call [phone] and find a Primary Care Physician

## 2018-10-21 NOTE — Telephone Encounter (Signed)
preop visit scheduled this afternoon

## 2018-10-21 NOTE — H&P (Signed)
Chief Complaint   Back pain  HPI   HPI: Julie Durham is a 78 y.o. female With relatively severe low back pain secondary to acute compression fractures at L1 and L4.  Pain is so severe that she has difficulties with standing and walking.  She denies any radiation of pain into legs.  She denies numbness and tingling in the lower extremities, bowel or bladder dysfunction.  She presents today for kyphoplasty at L1 and L4.  She is without any concerns.  Patient Active Problem List   Diagnosis Date Noted  . Hyperlipidemia 02/28/2017  . Oropharyngeal dysphagia 10/04/2016  . Other bursal cyst, left elbow 10/04/2016  . Pill esophagitis 10/04/2016  . Infected insect bite 06/29/2016  . Retained foreign body in soft tissue 06/29/2016  . Essential hypertension 11/18/2012  . Palpitations 11/18/2012    PMH: Past Medical History:  Diagnosis Date  . Allergy   . Anemia   . Arthritis   . Asthma   . Cataract   . Hypertension   . Mild mitral regurgitation   . Mild tricuspid regurgitation   . Palpitations     PSH: Past Surgical History:  Procedure Laterality Date  . APPENDECTOMY    . CHOLECYSTECTOMY    . DOPPLER ECHOCARDIOGRAPHY  2011  . EYE SURGERY    . TUBAL LIGATION      No medications prior to admission.    SH: Social History   Tobacco Use  . Smoking status: Never Smoker  . Smokeless tobacco: Never Used  Substance Use Topics  . Alcohol use: Never    Frequency: Never  . Drug use: Never    MEDS: Prior to Admission medications   Medication Sig Start Date End Date Taking? Authorizing Provider  aspirin EC 81 MG tablet Take 81 mg by mouth 2 (two) times daily.     [provider]  irbesartan (AVAPRO) 150 MG tablet TAKE 1 TABLET BY MOUTH DAILY Patient taking differently: Patient not sure 08/22/18   Runell Gess, MD  loratadine (CLARITIN) 10 MG tablet Take 10 mg by mouth daily as needed for allergies. Patient does not know    [provider]   MAGNESIUM PO Take 1 tablet by mouth daily.    [provider]  metoprolol tartrate (LOPRESSOR) 25 MG tablet TAKE 1/2 TABLET(12.5 MG) BY MOUTH TWICE DAILY 06/06/18   Runell Gess, MD  Multiple Vitamins-Minerals (PRESERVISION AREDS 2) CAPS Take 1 capsule by mouth 2 (two) times daily.    [provider]  traMADol (ULTRAM) 50 MG tablet Take 1 tablet (50 mg total) by mouth every 6 (six) hours as needed for severe pain. 09/18/18   Wieters, Hallie C, PA-C  atorvastatin (LIPITOR) 20 MG tablet TAKE 1 TABLET BY MOUTH DAILY 04/16/18 09/18/18  Runell Gess, MD    ALLERGY: Allergies  Allergen Reactions  . Shellfish Allergy Anaphylaxis  . Demerol   . Doxycycline Hyclate     Closed throat, chest pain  . Sulfa Antibiotics     Social History   Tobacco Use  . Smoking status: Never Smoker  . Smokeless tobacco: Never Used  Substance Use Topics  . Alcohol use: Never    Frequency: Never     Family History  Problem Relation Age of Onset  . Alzheimer's disease Mother   . Heart attack Father   . Heart disease Brother   . Diabetes Son   . Sensorineural hearing loss Sister   . Heart disease Brother   .  Heart disease Brother   . Thyroid disease Brother      ROS   ROS  Exam   There were no vitals filed for this visit. General appearance: WDWN, NAD Eyes: No scleral injection Cardiovascular: Regular rate and rhythm without murmurs, rubs, gallops. No edema or variciosities. Distal pulses normal. Pulmonary: Effort normal, non-labored breathing Musculoskeletal:     Muscle tone upper extremities: Normal    Muscle tone lower extremities: Normal    Motor exam: Upper Extremities Deltoid Bicep Tricep Grip  Right 5/5 5/5 5/5 5/5  Left 5/5 5/5 5/5 5/5   Lower Extremity IP Quad PF DF EHL  Right 5/5 5/5 5/5 5/5 5/5  Left 5/5 5/5 5/5 5/5 5/5   Neurological Mental Status:    - Patient is awake, alert, oriented to person, place, month, year, and situation    - Patient is  able to give a clear and coherent history.    - No signs of aphasia or neglect Cranial Nerves    - II: Visual Fields are full. PERRL    - III/IV/VI: EOMI without ptosis or diploplia.     - V: Facial sensation is grossly normal    - VII: Facial movement is symmetric.     - VIII: hearing is intact to voice    - X: Uvula elevates symmetrically    - XI: Shoulder shrug is symmetric.    - XII: tongue is midline without atrophy or fasciculations.  Sensory: Sensation grossly intact to LT  Results - Imaging/Labs   No results found for this or any previous visit (from the past 48 hour(s)).  No results found.  IMAGING: MRI of the lumbar spine was personally reviewed.  This demonstrates maintenance of lumbar lordosis.  There are acute compression fractures of L1 and L4 with greater than 50 percent height loss.  There is mild bony retropulsion without any significant canal stenosis.  There also more chronic appearing compression fractures of L2, as well as likely chronic sacral compression fracture.  Impression/Plan   78 y.o. female with low back pain consistent with acute compression fractures seen on MRI at L1 and L4.  We will proceed with kyphoplasty at L1 and L4.  While in the office risks, benefits and alternatives were discussed. Patient stated understanding and wished to proceed.  Ferne Reus, PA-C [facility] [employer]

## 2018-10-22 ENCOUNTER — Ambulatory Visit (HOSPITAL_COMMUNITY): Payer: Medicare Other

## 2018-10-22 ENCOUNTER — Ambulatory Visit (HOSPITAL_COMMUNITY): Payer: Medicare Other | Admitting: Vascular Surgery

## 2018-10-22 ENCOUNTER — Encounter (HOSPITAL_COMMUNITY): Payer: Self-pay | Admitting: Certified Registered Nurse Anesthetist

## 2018-10-22 ENCOUNTER — Other Ambulatory Visit: Payer: Self-pay | Admitting: Student

## 2018-10-22 ENCOUNTER — Encounter (HOSPITAL_COMMUNITY)
Admission: RE | Disposition: A | Discharge status: Home / Self Care | Payer: Self-pay | Source: Home / Self Care | Attending: Neurosurgery

## 2018-10-22 ENCOUNTER — Inpatient Hospital Stay (HOSPITAL_COMMUNITY)
Admission: RE | Admit: 2018-10-22 | Discharge: 2018-10-24 | DRG: 515 | Disposition: A | Discharge status: Home Health | Payer: Medicare Other | Source: Ambulatory Visit | Attending: Neurosurgery | Admitting: Neurosurgery

## 2018-10-22 ENCOUNTER — Other Ambulatory Visit: Payer: Self-pay

## 2018-10-22 DIAGNOSIS — I10 Essential (primary) hypertension: Secondary | ICD-10-CM | POA: Diagnosis present

## 2018-10-22 DIAGNOSIS — Z79899 Other long term (current) drug therapy: Secondary | ICD-10-CM

## 2018-10-22 DIAGNOSIS — I2510 Atherosclerotic heart disease of native coronary artery without angina pectoris: Secondary | ICD-10-CM | POA: Diagnosis present

## 2018-10-22 DIAGNOSIS — J449 Chronic obstructive pulmonary disease, unspecified: Secondary | ICD-10-CM | POA: Diagnosis present

## 2018-10-22 DIAGNOSIS — I700 Atherosclerosis of aorta: Secondary | ICD-10-CM | POA: Diagnosis present

## 2018-10-22 DIAGNOSIS — Z419 Encounter for procedure for purposes other than remedying health state, unspecified: Secondary | ICD-10-CM

## 2018-10-22 DIAGNOSIS — I340 Nonrheumatic mitral (valve) insufficiency: Secondary | ICD-10-CM | POA: Diagnosis not present

## 2018-10-22 DIAGNOSIS — Z8249 Family history of ischemic heart disease and other diseases of the circulatory system: Secondary | ICD-10-CM

## 2018-10-22 DIAGNOSIS — M4856XD Collapsed vertebra, not elsewhere classified, lumbar region, subsequent encounter for fracture with routine healing: Secondary | ICD-10-CM | POA: Diagnosis not present

## 2018-10-22 DIAGNOSIS — M4850XA Collapsed vertebra, not elsewhere classified, site unspecified, initial encounter for fracture: Secondary | ICD-10-CM | POA: Diagnosis present

## 2018-10-22 DIAGNOSIS — Z981 Arthrodesis status: Secondary | ICD-10-CM | POA: Diagnosis not present

## 2018-10-22 DIAGNOSIS — Z23 Encounter for immunization: Secondary | ICD-10-CM

## 2018-10-22 DIAGNOSIS — E785 Hyperlipidemia, unspecified: Secondary | ICD-10-CM | POA: Diagnosis present

## 2018-10-22 DIAGNOSIS — Z681 Body mass index (BMI) 19 or less, adult: Secondary | ICD-10-CM

## 2018-10-22 DIAGNOSIS — I5181 Takotsubo syndrome: Secondary | ICD-10-CM | POA: Diagnosis present

## 2018-10-22 DIAGNOSIS — Z91013 Allergy to seafood: Secondary | ICD-10-CM

## 2018-10-22 DIAGNOSIS — M8088XA Other osteoporosis with current pathological fracture, vertebra(e), initial encounter for fracture: Principal | ICD-10-CM | POA: Diagnosis present

## 2018-10-22 DIAGNOSIS — Z9889 Other specified postprocedural states: Secondary | ICD-10-CM

## 2018-10-22 DIAGNOSIS — R079 Chest pain, unspecified: Secondary | ICD-10-CM

## 2018-10-22 DIAGNOSIS — I519 Heart disease, unspecified: Secondary | ICD-10-CM

## 2018-10-22 DIAGNOSIS — S32000A Wedge compression fracture of unspecified lumbar vertebra, initial encounter for closed fracture: Secondary | ICD-10-CM

## 2018-10-22 DIAGNOSIS — M4846XA Fatigue fracture of vertebra, lumbar region, initial encounter for fracture: Secondary | ICD-10-CM

## 2018-10-22 DIAGNOSIS — R778 Other specified abnormalities of plasma proteins: Secondary | ICD-10-CM

## 2018-10-22 DIAGNOSIS — G9389 Other specified disorders of brain: Secondary | ICD-10-CM

## 2018-10-22 DIAGNOSIS — M8580 Other specified disorders of bone density and structure, unspecified site: Secondary | ICD-10-CM | POA: Diagnosis present

## 2018-10-22 DIAGNOSIS — S32010A Wedge compression fracture of first lumbar vertebra, initial encounter for closed fracture: Secondary | ICD-10-CM | POA: Diagnosis not present

## 2018-10-22 DIAGNOSIS — Z882 Allergy status to sulfonamides status: Secondary | ICD-10-CM

## 2018-10-22 DIAGNOSIS — I214 Non-ST elevation (NSTEMI) myocardial infarction: Secondary | ICD-10-CM | POA: Diagnosis not present

## 2018-10-22 DIAGNOSIS — Z20828 Contact with and (suspected) exposure to other viral communicable diseases: Secondary | ICD-10-CM | POA: Diagnosis present

## 2018-10-22 DIAGNOSIS — R636 Underweight: Secondary | ICD-10-CM | POA: Diagnosis present

## 2018-10-22 DIAGNOSIS — Z7982 Long term (current) use of aspirin: Secondary | ICD-10-CM

## 2018-10-22 DIAGNOSIS — Z888 Allergy status to other drugs, medicaments and biological substances status: Secondary | ICD-10-CM

## 2018-10-22 HISTORY — DX: Atherosclerotic heart disease of native coronary artery without angina pectoris: I25.10

## 2018-10-22 HISTORY — DX: Other specified abnormalities of plasma proteins: R77.8

## 2018-10-22 HISTORY — DX: Hyperlipidemia, unspecified: E78.5

## 2018-10-22 HISTORY — PX: KYPHOPLASTY: SHX5884

## 2018-10-22 HISTORY — DX: Wedge compression fracture of unspecified lumbar vertebra, initial encounter for closed fracture: S32.000A

## 2018-10-22 HISTORY — DX: Unspecified osteoarthritis, unspecified site: M19.90

## 2018-10-22 HISTORY — DX: Other specified abnormal findings of blood chemistry: R79.89

## 2018-10-22 HISTORY — DX: Takotsubo syndrome: I51.81

## 2018-10-22 LAB — BASIC METABOLIC PANEL
Anion gap: 12 (ref 5–15)
BUN/Creatinine Ratio: 28 (ref 12–28)
BUN: 16 mg/dL (ref 8–27)
BUN: 17 mg/dL (ref 8–23)
CO2: 25 mmol/L (ref 20–29)
CO2: 28 mmol/L (ref 22–32)
Calcium: 9.1 mg/dL (ref 8.7–10.3)
Calcium: 9 mg/dL (ref 8.9–10.3)
Chloride: 101 mmol/L (ref 96–106)
Chloride: 98 mmol/L (ref 98–111)
Creatinine, Ser: 0.58 mg/dL (ref 0.57–1.00)
Creatinine, Ser: 0.64 mg/dL (ref 0.44–1.00)
GFR calc Af Amer: 102 mL/min/{1.73_m2} (ref >59)
GFR calc Af Amer: >60 mL/min (ref >60)
GFR calc non Af Amer: 89 mL/min/{1.73_m2} (ref >59)
GFR calc non Af Amer: >60 mL/min (ref >60)
Glucose, Bld: 95 mg/dL (ref 70–99)
Glucose: 84 mg/dL (ref 65–99)
Potassium: 3.8 mmol/L (ref 3.5–5.2)
Potassium: 3.7 mmol/L (ref 3.5–5.1)
Sodium: 142 mmol/L (ref 134–144)
Sodium: 138 mmol/L (ref 135–145)

## 2018-10-22 LAB — CBC WITH DIFFERENTIAL/PLATELET
Basophils Absolute: 0 10*3/uL (ref 0.0–0.2)
Basos: 0 %
EOS (ABSOLUTE): 0 10*3/uL (ref 0.0–0.4)
Eos: 0 %
Hematocrit: 41.5 % (ref 34.0–46.6)
Hemoglobin: 13.9 g/dL (ref 11.1–15.9)
Immature Grans (Abs): 0 10*3/uL (ref 0.0–0.1)
Immature Granulocytes: 0 %
Lymphocytes Absolute: 1.4 10*3/uL (ref 0.7–3.1)
Lymphs: 24 %
MCH: 29.8 pg (ref 26.6–33.0)
MCHC: 33.5 g/dL (ref 31.5–35.7)
MCV: 89 fL (ref 79–97)
Monocytes Absolute: 0.3 10*3/uL (ref 0.1–0.9)
Monocytes: 5 %
Neutrophils Absolute: 4.3 10*3/uL (ref 1.4–7.0)
Neutrophils: 71 %
Platelets: 411 10*3/uL (ref 150–450)
RBC: 4.67 x10E6/uL (ref 3.77–5.28)
RDW: 12.2 % (ref 11.7–15.4)
WBC: 6 10*3/uL (ref 3.4–10.8)

## 2018-10-22 LAB — CBC
HCT: 40.9 % (ref 36.0–46.0)
Hemoglobin: 13.4 g/dL (ref 12.0–15.0)
MCH: 30 pg (ref 26.0–34.0)
MCHC: 32.8 g/dL (ref 30.0–36.0)
MCV: 91.7 fL (ref 80.0–100.0)
Platelets: 382 10*3/uL (ref 150–400)
RBC: 4.46 MIL/uL (ref 3.87–5.11)
RDW: 12.5 % (ref 11.5–15.5)
WBC: 5.3 10*3/uL (ref 4.0–10.5)
nRBC: 0 % (ref 0.0–0.2)

## 2018-10-22 LAB — MAGNESIUM: Magnesium: 2.1 mg/dL (ref 1.6–2.3)

## 2018-10-22 SURGERY — KYPHOPLASTY
Anesthesia: General | Site: Spine Lumbar | Laterality: Bilateral

## 2018-10-22 MED ORDER — FENTANYL CITRATE (PF) 250 MCG/5ML IJ SOLN
INTRAMUSCULAR | Status: AC
  Filled 2018-10-22: qty 5

## 2018-10-22 MED ORDER — LIDOCAINE 2% (20 MG/ML) 5 ML SYRINGE
INTRAMUSCULAR | Status: AC
  Filled 2018-10-22: qty 5

## 2018-10-22 MED ORDER — ACETAMINOPHEN 325 MG PO TABS: 650.0000 mg | ORAL_TABLET | ORAL | Status: DC | PRN

## 2018-10-22 MED ORDER — SODIUM CHLORIDE 0.9 % IV SOLN: 250.0000 mL | INTRAVENOUS | Status: DC

## 2018-10-22 MED ORDER — CEFAZOLIN SODIUM-DEXTROSE 2-4 GM/100ML-% IV SOLN
2.0000 g | INTRAVENOUS | Status: AC
  Administered 2018-10-22: 2 g via INTRAVENOUS
  Filled 2018-10-22: qty 100

## 2018-10-22 MED ORDER — OXYCODONE HCL 5 MG/5ML PO SOLN: 5.0000 mg | Freq: Once | ORAL | Status: DC | PRN

## 2018-10-22 MED ORDER — BUPIVACAINE HCL (PF) 0.5 % IJ SOLN
INTRAMUSCULAR | Status: AC
  Filled 2018-10-22: qty 30

## 2018-10-22 MED ORDER — BISACODYL 10 MG RE SUPP: 10.0000 mg | Freq: Every day | RECTAL | Status: DC | PRN

## 2018-10-22 MED ORDER — SODIUM CHLORIDE 0.9 % IV SOLN: INTRAVENOUS | Status: DC

## 2018-10-22 MED ORDER — IOPAMIDOL (ISOVUE-300) INJECTION 61%
INTRAVENOUS | Status: DC | PRN
  Administered 2018-10-22 (×2): 50 mL

## 2018-10-22 MED ORDER — PHENOL 1.4 % MT LIQD: 1.0000 | OROMUCOSAL | Status: DC | PRN

## 2018-10-22 MED ORDER — TRAMADOL HCL 50 MG PO TABS
50.0000 mg | ORAL_TABLET | Freq: Four times a day (QID) | ORAL | Status: DC | PRN
  Administered 2018-10-23 (×2): 50 mg via ORAL
  Filled 2018-10-22 (×3): qty 1

## 2018-10-22 MED ORDER — LIDOCAINE-EPINEPHRINE 1 %-1:100000 IJ SOLN
INTRAMUSCULAR | Status: DC | PRN
  Administered 2018-10-22: 5 mL

## 2018-10-22 MED ORDER — ONDANSETRON HCL 4 MG/2ML IJ SOLN: 4.0000 mg | Freq: Four times a day (QID) | INTRAMUSCULAR | Status: DC | PRN

## 2018-10-22 MED ORDER — PROMETHAZINE HCL 25 MG/ML IJ SOLN: 6.2500 mg | INTRAMUSCULAR | Status: DC | PRN

## 2018-10-22 MED ORDER — METHOCARBAMOL 500 MG PO TABS: 500.0000 mg | ORAL_TABLET | Freq: Four times a day (QID) | ORAL | Status: DC | PRN

## 2018-10-22 MED ORDER — ROCURONIUM BROMIDE 10 MG/ML (PF) SYRINGE
PREFILLED_SYRINGE | INTRAVENOUS | Status: DC | PRN
  Administered 2018-10-22: 5 mg via INTRAVENOUS
  Administered 2018-10-22: 10 mg via INTRAVENOUS
  Administered 2018-10-22: 20 mg via INTRAVENOUS
  Administered 2018-10-22: 5 mg via INTRAVENOUS

## 2018-10-22 MED ORDER — PHENYLEPHRINE 40 MCG/ML (10ML) SYRINGE FOR IV PUSH (FOR BLOOD PRESSURE SUPPORT)
PREFILLED_SYRINGE | INTRAVENOUS | Status: AC
  Filled 2018-10-22: qty 10

## 2018-10-22 MED ORDER — METOPROLOL TARTRATE 12.5 MG HALF TABLET
12.5000 mg | ORAL_TABLET | Freq: Every day | ORAL | Status: DC
  Administered 2018-10-23 – 2018-10-24 (×2): 12.5 mg via ORAL
  Filled 2018-10-22 (×2): qty 1

## 2018-10-22 MED ORDER — ACETAMINOPHEN 650 MG RE SUPP: 650.0000 mg | RECTAL | Status: DC | PRN

## 2018-10-22 MED ORDER — HYDRALAZINE HCL 20 MG/ML IJ SOLN
INTRAMUSCULAR | Status: DC | PRN
  Administered 2018-10-22 (×2): 2 mg via INTRAVENOUS

## 2018-10-22 MED ORDER — FLEET ENEMA 7-19 GM/118ML RE ENEM: 1.0000 | ENEMA | Freq: Once | RECTAL | Status: DC | PRN

## 2018-10-22 MED ORDER — IRBESARTAN 150 MG PO TABS
150.0000 mg | ORAL_TABLET | Freq: Every day | ORAL | Status: DC
  Administered 2018-10-23 – 2018-10-24 (×2): 150 mg via ORAL
  Filled 2018-10-22: qty 1

## 2018-10-22 MED ORDER — SODIUM CHLORIDE 0.9% FLUSH: 3.0000 mL | Freq: Two times a day (BID) | INTRAVENOUS | Status: DC

## 2018-10-22 MED ORDER — HYDROCODONE-ACETAMINOPHEN 5-325 MG PO TABS
1.0000 | ORAL_TABLET | ORAL | Status: DC | PRN
  Administered 2018-10-24: 1 via ORAL
  Filled 2018-10-22: qty 1

## 2018-10-22 MED ORDER — ONDANSETRON HCL 4 MG/2ML IJ SOLN
INTRAMUSCULAR | Status: AC
  Filled 2018-10-22: qty 2

## 2018-10-22 MED ORDER — MAGNESIUM OXIDE 400 (241.3 MG) MG PO TABS
200.0000 mg | ORAL_TABLET | Freq: Every day | ORAL | Status: DC
  Administered 2018-10-23 – 2018-10-24 (×2): 200 mg via ORAL
  Filled 2018-10-22 (×2): qty 1

## 2018-10-22 MED ORDER — LACTATED RINGERS IV SOLN
INTRAVENOUS | Status: DC | PRN
  Administered 2018-10-22: 07:00:00 via INTRAVENOUS

## 2018-10-22 MED ORDER — HYDROMORPHONE HCL 1 MG/ML IJ SOLN: 0.5000 mg | INTRAMUSCULAR | Status: DC | PRN

## 2018-10-22 MED ORDER — BUPIVACAINE HCL (PF) 0.5 % IJ SOLN
INTRAMUSCULAR | Status: DC | PRN
  Administered 2018-10-22: 5 mL

## 2018-10-22 MED ORDER — DEXAMETHASONE SODIUM PHOSPHATE 10 MG/ML IJ SOLN
INTRAMUSCULAR | Status: DC | PRN
  Administered 2018-10-22: 5 mg via INTRAVENOUS

## 2018-10-22 MED ORDER — SENNA 8.6 MG PO TABS
1.0000 | ORAL_TABLET | Freq: Two times a day (BID) | ORAL | Status: DC
  Administered 2018-10-22 – 2018-10-24 (×4): 8.6 mg via ORAL
  Filled 2018-10-22 (×4): qty 1

## 2018-10-22 MED ORDER — HYDROCODONE-ACETAMINOPHEN 5-325 MG PO TABS: 1.0000 | ORAL_TABLET | ORAL | Status: DC | PRN

## 2018-10-22 MED ORDER — LIDOCAINE-EPINEPHRINE 1 %-1:100000 IJ SOLN
INTRAMUSCULAR | Status: AC
  Filled 2018-10-22: qty 1

## 2018-10-22 MED ORDER — GABAPENTIN 300 MG PO CAPS: 300.0000 mg | ORAL_CAPSULE | Freq: Three times a day (TID) | ORAL | Status: DC

## 2018-10-22 MED ORDER — FENTANYL CITRATE (PF) 250 MCG/5ML IJ SOLN
INTRAMUSCULAR | Status: DC | PRN
  Administered 2018-10-22: 50 ug via INTRAVENOUS

## 2018-10-22 MED ORDER — PROPOFOL 10 MG/ML IV BOLUS
INTRAVENOUS | Status: AC
  Filled 2018-10-22: qty 20

## 2018-10-22 MED ORDER — OXYCODONE HCL 5 MG PO TABS: 5.0000 mg | ORAL_TABLET | ORAL | Status: DC | PRN

## 2018-10-22 MED ORDER — METHOCARBAMOL 1000 MG/10ML IJ SOLN
500.0000 mg | Freq: Four times a day (QID) | INTRAVENOUS | Status: DC | PRN
  Filled 2018-10-22: qty 5

## 2018-10-22 MED ORDER — ONDANSETRON HCL 4 MG PO TABS: 4.0000 mg | ORAL_TABLET | Freq: Four times a day (QID) | ORAL | Status: DC | PRN

## 2018-10-22 MED ORDER — CEFAZOLIN SODIUM-DEXTROSE 1-4 GM/50ML-% IV SOLN
1.0000 g | Freq: Three times a day (TID) | INTRAVENOUS | Status: AC
  Administered 2018-10-22 (×2): 1 g via INTRAVENOUS
  Filled 2018-10-22 (×2): qty 50

## 2018-10-22 MED ORDER — ACETAMINOPHEN 500 MG PO TABS
1000.0000 mg | ORAL_TABLET | Freq: Four times a day (QID) | ORAL | Status: AC
  Administered 2018-10-22 – 2018-10-23 (×4): 1000 mg via ORAL
  Filled 2018-10-22 (×4): qty 2

## 2018-10-22 MED ORDER — FENTANYL CITRATE (PF) 100 MCG/2ML IJ SOLN: 25.0000 ug | INTRAMUSCULAR | Status: DC | PRN

## 2018-10-22 MED ORDER — OXYCODONE HCL 5 MG PO TABS: 5.0000 mg | ORAL_TABLET | Freq: Once | ORAL | Status: DC | PRN

## 2018-10-22 MED ORDER — ONDANSETRON HCL 4 MG/2ML IJ SOLN
INTRAMUSCULAR | Status: DC | PRN
  Administered 2018-10-22: 4 mg via INTRAVENOUS

## 2018-10-22 MED ORDER — SODIUM CHLORIDE 0.9% FLUSH: 3.0000 mL | INTRAVENOUS | Status: DC | PRN

## 2018-10-22 MED ORDER — LORATADINE 10 MG PO TABS: 10.0000 mg | ORAL_TABLET | Freq: Every day | ORAL | Status: DC | PRN

## 2018-10-22 MED ORDER — PROPOFOL 10 MG/ML IV BOLUS
INTRAVENOUS | Status: DC | PRN
  Administered 2018-10-22: 80 mg via INTRAVENOUS

## 2018-10-22 MED ORDER — SUCCINYLCHOLINE CHLORIDE 200 MG/10ML IV SOSY
PREFILLED_SYRINGE | INTRAVENOUS | Status: AC
  Filled 2018-10-22: qty 10

## 2018-10-22 MED ORDER — LIDOCAINE 2% (20 MG/ML) 5 ML SYRINGE
INTRAMUSCULAR | Status: DC | PRN
  Administered 2018-10-22: 40 mg via INTRAVENOUS

## 2018-10-22 MED ORDER — CHLORHEXIDINE GLUCONATE CLOTH 2 % EX PADS: 6.0000 | MEDICATED_PAD | Freq: Once | CUTANEOUS | Status: DC

## 2018-10-22 MED ORDER — MENTHOL 3 MG MT LOZG
1.0000 | LOZENGE | OROMUCOSAL | Status: DC | PRN
  Filled 2018-10-22: qty 9

## 2018-10-22 MED ORDER — PHENYLEPHRINE 40 MCG/ML (10ML) SYRINGE FOR IV PUSH (FOR BLOOD PRESSURE SUPPORT)
PREFILLED_SYRINGE | INTRAVENOUS | Status: DC | PRN
  Administered 2018-10-22 (×4): 40 ug via INTRAVENOUS
  Administered 2018-10-22: 80 ug via INTRAVENOUS

## 2018-10-22 MED ORDER — 0.9 % SODIUM CHLORIDE (POUR BTL) OPTIME
TOPICAL | Status: DC | PRN
  Administered 2018-10-22: 1000 mL

## 2018-10-22 MED ORDER — SUGAMMADEX SODIUM 200 MG/2ML IV SOLN
INTRAVENOUS | Status: DC | PRN
  Administered 2018-10-22 (×2): 50 mg via INTRAVENOUS

## 2018-10-22 MED ORDER — ROCURONIUM BROMIDE 10 MG/ML (PF) SYRINGE
PREFILLED_SYRINGE | INTRAVENOUS | Status: AC
  Filled 2018-10-22: qty 10

## 2018-10-22 MED ORDER — SUCCINYLCHOLINE CHLORIDE 200 MG/10ML IV SOSY
PREFILLED_SYRINGE | INTRAVENOUS | Status: DC | PRN
  Administered 2018-10-22: 60 mg via INTRAVENOUS

## 2018-10-22 MED ORDER — SODIUM CHLORIDE (PF) 0.9 % IJ SOLN
INTRAMUSCULAR | Status: AC
  Filled 2018-10-22: qty 10

## 2018-10-22 MED ORDER — SENNOSIDES-DOCUSATE SODIUM 8.6-50 MG PO TABS: 1.0000 | ORAL_TABLET | Freq: Every evening | ORAL | Status: DC | PRN

## 2018-10-22 MED ORDER — DOCUSATE SODIUM 100 MG PO CAPS
100.0000 mg | ORAL_CAPSULE | Freq: Two times a day (BID) | ORAL | Status: DC
  Administered 2018-10-22 – 2018-10-24 (×4): 100 mg via ORAL
  Filled 2018-10-22 (×4): qty 1

## 2018-10-22 MED ORDER — HYDRALAZINE HCL 20 MG/ML IJ SOLN
INTRAMUSCULAR | Status: AC
  Filled 2018-10-22: qty 1

## 2018-10-22 MED ORDER — CEFAZOLIN SODIUM-DEXTROSE 2-4 GM/100ML-% IV SOLN: 2.0000 g | Freq: Three times a day (TID) | INTRAVENOUS | Status: DC

## 2018-10-22 SURGICAL SUPPLY — 39 items
ADH SKN CLS APL DERMABOND .7 (GAUZE/BANDAGES/DRESSINGS) ×1
BLADE CLIPPER SURG (BLADE) IMPLANT
BLADE SURG 15 STRL LF DISP TIS (BLADE) ×1 IMPLANT
BLADE SURG 15 STRL SS (BLADE) ×3
CEMENT BONE KYPHX HV R (Orthopedic Implant) ×4 IMPLANT
DERMABOND ADVANCED (GAUZE/BANDAGES/DRESSINGS) ×2
DERMABOND ADVANCED .7 DNX12 (GAUZE/BANDAGES/DRESSINGS) ×1 IMPLANT
DRAPE C-ARM 42X72 X-RAY (DRAPES) ×3 IMPLANT
DRAPE HALF SHEET 40X57 (DRAPES) ×3 IMPLANT
DRAPE INCISE IOBAN 66X45 STRL (DRAPES) ×3 IMPLANT
DRAPE LAPAROTOMY 100X72X124 (DRAPES) ×3 IMPLANT
DRAPE SURG 17X23 STRL (DRAPES) ×3 IMPLANT
DRAPE WARM FLUID 44X44 (DRAPES) ×3 IMPLANT
DURAPREP 26ML APPLICATOR (WOUND CARE) ×3 IMPLANT
GAUZE 4X4 16PLY RFD (DISPOSABLE) ×3 IMPLANT
GLOVE BIO SURGEON STRL SZ7 (GLOVE) IMPLANT
GLOVE BIOGEL PI IND STRL 7.0 (GLOVE) IMPLANT
GLOVE BIOGEL PI IND STRL 7.5 (GLOVE) ×1 IMPLANT
GLOVE BIOGEL PI INDICATOR 7.0 (GLOVE)
GLOVE BIOGEL PI INDICATOR 7.5 (GLOVE) ×2
GLOVE ECLIPSE 7.0 STRL STRAW (GLOVE) ×3 IMPLANT
GLOVE EXAM NITRILE XL STR (GLOVE) IMPLANT
GOWN STRL REUS W/ TWL LRG LVL3 (GOWN DISPOSABLE) ×2 IMPLANT
GOWN STRL REUS W/ TWL XL LVL3 (GOWN DISPOSABLE) IMPLANT
GOWN STRL REUS W/TWL 2XL LVL3 (GOWN DISPOSABLE) IMPLANT
GOWN STRL REUS W/TWL LRG LVL3 (GOWN DISPOSABLE) ×15
GOWN STRL REUS W/TWL XL LVL3 (GOWN DISPOSABLE)
KIT BASIN OR (CUSTOM PROCEDURE TRAY) ×3 IMPLANT
KIT TURNOVER KIT B (KITS) ×3 IMPLANT
NEEDLE HYPO 25X1 1.5 SAFETY (NEEDLE) ×3 IMPLANT
NS IRRIG 1000ML POUR BTL (IV SOLUTION) ×3 IMPLANT
PACK SURGICAL SETUP 50X90 (CUSTOM PROCEDURE TRAY) ×3 IMPLANT
PAD ARMBOARD 7.5X6 YLW CONV (MISCELLANEOUS) ×9 IMPLANT
SPECIMEN JAR SMALL (MISCELLANEOUS) IMPLANT
SUT VICRYL 3-0 RB1 18 ABS (SUTURE) ×3 IMPLANT
SYR CONTROL 10ML LL (SYRINGE) ×6 IMPLANT
TOWEL GREEN STERILE (TOWEL DISPOSABLE) ×3 IMPLANT
TOWEL GREEN STERILE FF (TOWEL DISPOSABLE) ×3 IMPLANT
TRAY KYPHOPAK 20/3 ONESTEP 1ST (MISCELLANEOUS) ×4 IMPLANT

## 2018-10-22 NOTE — Telephone Encounter (Signed)
Patient was cleared yesterday in the office

## 2018-10-22 NOTE — Transfer of Care (Signed)
Immediate Anesthesia Transfer of Care Note  Patient: Julie Durham  Procedure(s) Performed: KYPHOPLASTY LUMBAR ONE, LUMBAR FOUR (Bilateral Spine Lumbar)  Patient Location: PACU  Anesthesia Type:General  Level of Consciousness: awake and drowsy  Airway & Oxygen Therapy: Patient Spontanous Breathing  Post-op Assessment: Report given to RN, Post -op Vital signs reviewed and stable and Patient moving all extremities X 4  Post vital signs: Reviewed and stable  Last Vitals:  Vitals Value Taken Time  BP 152/79 10/22/18 0950  Temp    Pulse 82 10/22/18 0949  Resp 16 10/22/18 0949  SpO2 99 % 10/22/18 0949  Vitals shown include unvalidated device data.  Last Pain:  Vitals:   10/22/18 0643  PainSc: 10-Worst pain ever      Patients Stated Pain Goal: 2 (04/59/97 7414)  Complications: No apparent anesthesia complications

## 2018-10-22 NOTE — Evaluation (Signed)
Physical Therapy Evaluation Patient Details Name: Julie Durham MRN: [id number] DOB: 1940-01-22 Today's Date: 10/22/2018   History of Present Illness  Pt is a 78 y/o female s/p L1 and L4 kyphoplasty. PMH includes arthritis, asthma, and HTN.   Clinical Impression  Patient is s/p above surgery resulting in the deficits listed below (see PT Problem List). Pt requiring min to min guard A for mobility tasks this session using RW. Reviewed back precautions, however, pt requiring some cues to maintain precautions during bed mobility. Pt's daughter voiced concerns about pt returning home as she lives alone, however, pt adamantly refusing SNF. Educated about removing clutter in home to increase safety with mobility, as that was one of pt's daughter's main concerns. Patient will benefit from skilled PT to increase their independence and safety with mobility (while adhering to their precautions) to allow discharge to the venue listed below.     Follow Up Recommendations Home health PT (Pt refusing SNF)    Equipment Recommendations  None recommended by PT    Recommendations for Other Services       Precautions / Restrictions Precautions Precautions: Back Precaution Booklet Issued: Yes (comment) Precaution Comments: Reviewed back precautions with pt Restrictions Weight Bearing Restrictions: No      Mobility  Bed Mobility Overal bed mobility: Needs Assistance Bed Mobility: Rolling;Sidelying to Sit;Sit to Sidelying Rolling: Min assist Sidelying to sit: Min assist     Sit to sidelying: Min assist General bed mobility comments: Min A for assist with maintaining precautions during bed mobility. Required cues for sequencing using log roll technique.   Transfers Overall transfer level: Needs assistance Equipment used: Rolling walker (2 wheeled) Transfers: Sit to/from Stand Sit to Stand: Min guard         General transfer comment: Min guard for safety to stand. Cues for safe hand  placement.   Ambulation/Gait Ambulation/Gait assistance: Min guard Gait Distance (Feet): 150 Feet Assistive device: Rolling walker (2 wheeled) Gait Pattern/deviations: Step-through pattern;Decreased stride length;Trunk flexed Gait velocity: Decreased   General Gait Details: Slow, guarded gait. Pt very cautious and not wanting to fall. Overall required min guard A for safety. No overt LOB noted.   Stairs            Wheelchair Mobility    Modified Rankin (Stroke Patients Only)       Balance Overall balance assessment: Needs assistance Sitting-balance support: No upper extremity supported;Feet supported Sitting balance-Leahy Scale: Good     Standing balance support: Bilateral upper extremity supported;During functional activity Standing balance-Leahy Scale: Poor Standing balance comment: Reliant on BUE support                              Pertinent Vitals/Pain Pain Assessment: Faces Faces Pain Scale: Hurts a little bit Pain Location: back Pain Descriptors / Indicators: Aching;Operative site guarding Pain Intervention(s): Limited activity within patient's tolerance;Monitored during session;Repositioned    Home Living Family/patient expects to be discharged to:: Private residence Living Arrangements: Alone Available Help at Discharge: Family;Available PRN/intermittently Type of Home: House Home Access: Stairs to enter Entrance Stairs-Rails: Psychiatric nurse of Steps: 4-5 Home Layout: One level Home Equipment: Walker - 2 wheels;Cane - quad;Cane - single point Additional Comments: Per pt's daughter, pt's home very cluttered with multiple small pathways.     Prior Function Level of Independence: Independent with assistive device(s)         Comments: Uses cane. Has had falls at home  Hand Dominance        Extremity/Trunk Assessment   Upper Extremity Assessment Upper Extremity Assessment: Defer to OT evaluation    Lower  Extremity Assessment Lower Extremity Assessment: Generalized weakness    Cervical / Trunk Assessment Cervical / Trunk Assessment: Other exceptions Cervical / Trunk Exceptions: s/p kyphoplasty  Communication   Communication: No difficulties  Cognition Arousal/Alertness: Awake/alert Behavior During Therapy: WFL for tasks assessed/performed Overall Cognitive Status: Within Functional Limits for tasks assessed                                 General Comments: Pt very chatty. For basic tasks cognition seems [facility].       General Comments General comments (skin integrity, edema, etc.): Pt's daughter present and voiced concerns about pt returning home alone. Pt, however, adamantly refusing SNF at this time.     Exercises     Assessment/Plan    PT Assessment Patient needs continued PT services  PT Problem List Decreased strength;Decreased balance;Decreased mobility;Decreased knowledge of use of DME;Decreased knowledge of precautions;Pain;Decreased range of motion       PT Treatment Interventions Gait training;DME instruction;Functional mobility training;Therapeutic activities;Therapeutic exercise;Stair training;Balance training;Patient/family education    PT Goals (Current goals can be found in the Care Plan section)  Acute Rehab PT Goals Patient Stated Goal: to go home  PT Goal Formulation: With patient Time For Goal Achievement: 11/05/18 Potential to Achieve Goals: Good    Frequency Min 5X/week   Barriers to discharge Decreased caregiver support      Co-evaluation               AM-PAC PT "6 Clicks" Mobility  Outcome Measure Help needed turning from your back to your side while in a flat bed without using bedrails?: A Little Help needed moving from lying on your back to sitting on the side of a flat bed without using bedrails?: A Little Help needed moving to and from a bed to a chair (including a wheelchair)?: A Little Help needed standing up from a chair  using your arms (e.g., wheelchair or bedside chair)?: A Little Help needed to walk in hospital room?: A Little Help needed climbing 3-5 steps with a railing? : A Lot 6 Click Score: 17    End of Session Equipment Utilized During Treatment: Gait belt Activity Tolerance: Patient tolerated treatment well Patient left: in bed;with call bell/phone within reach;with family/visitor present Nurse Communication: Mobility status PT Visit Diagnosis: Other abnormalities of gait and mobility (R26.89);Pain Pain - part of body: (back)    Time: 9509-3267 PT Time Calculation (min) (ACUTE ONLY): 28 min   Charges:   PT Evaluation $PT Eval Low Complexity: 1 Low PT Treatments $Gait Training: 8-22 mins        Gladys Damme, PT, DPT  Acute Rehabilitation Services  Pager: [phone] Office: [phone]   Lehman Prom 10/22/2018, 3:04 PM

## 2018-10-22 NOTE — Anesthesia Procedure Notes (Signed)
Procedure Name: Intubation Date/Time: 10/22/2018 8:10 AM Performed by: Harden Mo, CRNA Pre-anesthesia Checklist: Patient identified, Emergency Drugs available, Suction available and Patient being monitored Patient Re-evaluated:Patient Re-evaluated prior to induction Oxygen Delivery Method: Circle System Utilized Preoxygenation: Pre-oxygenation with 100% oxygen Induction Type: IV induction and Rapid sequence Laryngoscope Size: Miller and 2 Grade View: Grade I Tube type: Oral Tube size: 7.0 mm Number of attempts: 1 Airway Equipment and Method: Stylet and Oral airway Placement Confirmation: ETT inserted through vocal cords under direct vision,  positive ETCO2 and breath sounds checked- equal and bilateral Secured at: 20 cm Tube secured with: Tape Dental Injury: Teeth and Oropharynx as per pre-operative assessment

## 2018-10-22 NOTE — Op Note (Signed)
  NEUROSURGERY OPERATIVE NOTE   PREOP DIAGNOSIS: L1, L4 compression fx   POSTOP DIAGNOSIS: Same  PROCEDURE: 1. L1 Kyphoplasty 2. L4 kyphoplasty  SURGEON: Dr. Consuella Lose, MD  ASSISTANT: Ferne Reus, PA-C  ANESTHESIA: GETA  EBL: Minimal  SPECIMENS: None  DRAINS: None  COMPLICATIONS: None immediate  CONDITION: Hemodynamically stable to PCAU  HISTORY: Julie Durham is a 78 y.o. yo female presented to the outpatient neurosurgery clinic with severe back pain limiting her ability to ambulate.  Work-up included x-rays demonstrating compression fractures at L1, L2, S2, and L4 and MRI demonstrating the L1 and L4 fractures to be acute/subacute.  She did not have improvement with reasonable conservative treatment and elected to proceed with kyphoplasty.  Risks and benefits of the procedure were reviewed in detail with the patient and her daughter.  After all questions were answered informed consent was obtained and witnessed.  PROCEDURE IN DETAIL: After informed consent was obtained and witnessed, the patient was brought to the operating room. After induction of anesthesia, the patient was positioned on the operative table in the prone position. All pressure points were meticulously padded. Fluoroscopy was used to mark out the projection of the L1 and L4 pedicles on the skin. Skin incision was then marked out and prepped and draped in the usual sterile fashion.  After time out was conducted, bilateral stab incisions were made overlying the L1 and L4 pedicles, and Jamshidi needles were introduced. Under AP and lateral fluoroscopic guidance, bilateral L1 and L4 pedicles were canulated. Drill was then used to create a channel and the kyphoplasty balloon was placed and expanded to create a cavity.  Balloons were noted to noticeably expand the compression fracture, and expanded medially nearly to the midline at both L1 and L4.  PMMA cement was injected in each side at both levels under  fluoroscopy, taking care to preserve the posterior cortex. The balloons were then removed, replaced with the trocars, and the Jamshidi needles removed.  I did note some hardening of the PMMA cement in the left L4 Jamshidi needle.  Once this needle was removed, there is a small tract of cement within the pedicle.  In addition, there was some PMMA cement within the pedicle on the right side at L4 as well however AP fluoroscopy did not demonstrate any of this to be within the canal.  Stab incisions were then closed with 3-0 vicryl suture and standard skin glue. The patient was then transferred to the stretcher and taken to the PACU in stable hemodynamic condition.  At the end of the case all sponge, needle, and instrument counts were correct.

## 2018-10-22 NOTE — Progress Notes (Signed)
  NEUROSURGERY PROGRESS NOTE   Patient seen and evaluated after surgery. Reports complete resolution in pre op pain. Up ambulating with minimal difficulties. Voiding normal  EXAM:  BP 132/79 (BP Location: Right Arm)   Pulse 82   Temp (!) 97.5 F (36.4 C) (Oral)   Resp 16   Ht 4\' 10"  (1.473 m)   Wt 26.8 kg   SpO2 94%   BMI 12.33 kg/m   Awake, alert, oriented  Speech fluent, appropriate  CN grossly intact  5/5 BUE/BLE  Incision c/d/i  IMPRESSION/PLAN 78 y.o. female s/p L1 and L4 kyphoplasty. Doing well. - continue to work on ambulation - d/c tomorrow am

## 2018-10-23 ENCOUNTER — Encounter (HOSPITAL_COMMUNITY): Payer: Self-pay | Admitting: Neurosurgery

## 2018-10-23 ENCOUNTER — Encounter (HOSPITAL_COMMUNITY)
Admission: RE | Disposition: A | Discharge status: Home / Self Care | Payer: Self-pay | Source: Home / Self Care | Attending: Neurosurgery

## 2018-10-23 ENCOUNTER — Inpatient Hospital Stay (HOSPITAL_COMMUNITY): Payer: Medicare Other

## 2018-10-23 ENCOUNTER — Observation Stay (HOSPITAL_COMMUNITY): Payer: Medicare Other

## 2018-10-23 DIAGNOSIS — I351 Nonrheumatic aortic (valve) insufficiency: Secondary | ICD-10-CM

## 2018-10-23 DIAGNOSIS — Z9889 Other specified postprocedural states: Secondary | ICD-10-CM | POA: Diagnosis not present

## 2018-10-23 DIAGNOSIS — I519 Heart disease, unspecified: Secondary | ICD-10-CM | POA: Diagnosis not present

## 2018-10-23 DIAGNOSIS — M8088XA Other osteoporosis with current pathological fracture, vertebra(e), initial encounter for fracture: Secondary | ICD-10-CM | POA: Diagnosis not present

## 2018-10-23 DIAGNOSIS — I5181 Takotsubo syndrome: Secondary | ICD-10-CM

## 2018-10-23 DIAGNOSIS — Z23 Encounter for immunization: Secondary | ICD-10-CM | POA: Diagnosis not present

## 2018-10-23 DIAGNOSIS — Z681 Body mass index (BMI) 19 or less, adult: Secondary | ICD-10-CM | POA: Diagnosis not present

## 2018-10-23 DIAGNOSIS — I214 Non-ST elevation (NSTEMI) myocardial infarction: Secondary | ICD-10-CM

## 2018-10-23 DIAGNOSIS — Z91013 Allergy to seafood: Secondary | ICD-10-CM | POA: Diagnosis not present

## 2018-10-23 DIAGNOSIS — R636 Underweight: Secondary | ICD-10-CM | POA: Diagnosis present

## 2018-10-23 DIAGNOSIS — I10 Essential (primary) hypertension: Secondary | ICD-10-CM

## 2018-10-23 DIAGNOSIS — J449 Chronic obstructive pulmonary disease, unspecified: Secondary | ICD-10-CM | POA: Diagnosis present

## 2018-10-23 DIAGNOSIS — Z882 Allergy status to sulfonamides status: Secondary | ICD-10-CM | POA: Diagnosis not present

## 2018-10-23 DIAGNOSIS — M4856XA Collapsed vertebra, not elsewhere classified, lumbar region, initial encounter for fracture: Secondary | ICD-10-CM | POA: Diagnosis present

## 2018-10-23 DIAGNOSIS — M8580 Other specified disorders of bone density and structure, unspecified site: Secondary | ICD-10-CM | POA: Diagnosis present

## 2018-10-23 DIAGNOSIS — M4846XA Fatigue fracture of vertebra, lumbar region, initial encounter for fracture: Secondary | ICD-10-CM | POA: Diagnosis not present

## 2018-10-23 DIAGNOSIS — I2510 Atherosclerotic heart disease of native coronary artery without angina pectoris: Secondary | ICD-10-CM

## 2018-10-23 DIAGNOSIS — Z79899 Other long term (current) drug therapy: Secondary | ICD-10-CM | POA: Diagnosis not present

## 2018-10-23 DIAGNOSIS — I700 Atherosclerosis of aorta: Secondary | ICD-10-CM | POA: Diagnosis present

## 2018-10-23 DIAGNOSIS — E785 Hyperlipidemia, unspecified: Secondary | ICD-10-CM | POA: Diagnosis present

## 2018-10-23 DIAGNOSIS — R079 Chest pain, unspecified: Secondary | ICD-10-CM | POA: Diagnosis not present

## 2018-10-23 DIAGNOSIS — Z8249 Family history of ischemic heart disease and other diseases of the circulatory system: Secondary | ICD-10-CM | POA: Diagnosis not present

## 2018-10-23 DIAGNOSIS — Z20828 Contact with and (suspected) exposure to other viral communicable diseases: Secondary | ICD-10-CM | POA: Diagnosis present

## 2018-10-23 DIAGNOSIS — Z7982 Long term (current) use of aspirin: Secondary | ICD-10-CM | POA: Diagnosis not present

## 2018-10-23 DIAGNOSIS — Z888 Allergy status to other drugs, medicaments and biological substances status: Secondary | ICD-10-CM | POA: Diagnosis not present

## 2018-10-23 HISTORY — PX: LEFT HEART CATH AND CORONARY ANGIOGRAPHY: CATH118249

## 2018-10-23 LAB — CBC
HCT: 35.8 % — ABNORMAL LOW (ref 36.0–46.0)
Hemoglobin: 12.1 g/dL (ref 12.0–15.0)
MCH: 30.6 pg (ref 26.0–34.0)
MCHC: 33.8 g/dL (ref 30.0–36.0)
MCV: 90.4 fL (ref 80.0–100.0)
Platelets: 378 10*3/uL (ref 150–400)
RBC: 3.96 MIL/uL (ref 3.87–5.11)
RDW: 12.7 % (ref 11.5–15.5)
WBC: 7.5 10*3/uL (ref 4.0–10.5)
nRBC: 0 % (ref 0.0–0.2)

## 2018-10-23 LAB — PROTIME-INR
INR: 1.1 (ref 0.8–1.2)
Prothrombin Time: 13.7 seconds (ref 11.4–15.2)

## 2018-10-23 LAB — APTT: aPTT: 26 seconds (ref 24–36)

## 2018-10-23 LAB — TROPONIN I (HIGH SENSITIVITY)
Troponin I (High Sensitivity): 866 ng/L (ref <18)
Troponin I (High Sensitivity): 1178 ng/L (ref <18)

## 2018-10-23 LAB — BASIC METABOLIC PANEL
Anion gap: 9 (ref 5–15)
BUN: 13 mg/dL (ref 8–23)
CO2: 26 mmol/L (ref 22–32)
Calcium: 9 mg/dL (ref 8.9–10.3)
Chloride: 102 mmol/L (ref 98–111)
Creatinine, Ser: 0.41 mg/dL — ABNORMAL LOW (ref 0.44–1.00)
GFR calc Af Amer: >60 mL/min (ref >60)
GFR calc non Af Amer: >60 mL/min (ref >60)
Glucose, Bld: 108 mg/dL — ABNORMAL HIGH (ref 70–99)
Potassium: 4.2 mmol/L (ref 3.5–5.1)
Sodium: 137 mmol/L (ref 135–145)

## 2018-10-23 SURGERY — LEFT HEART CATH AND CORONARY ANGIOGRAPHY
Anesthesia: LOCAL

## 2018-10-23 MED ORDER — SODIUM CHLORIDE 0.9% FLUSH: 3.0000 mL | INTRAVENOUS | Status: DC | PRN

## 2018-10-23 MED ORDER — FENTANYL CITRATE (PF) 100 MCG/2ML IJ SOLN
INTRAMUSCULAR | Status: AC
  Filled 2018-10-23: qty 2

## 2018-10-23 MED ORDER — ASPIRIN 81 MG PO CHEW
81.0000 mg | CHEWABLE_TABLET | ORAL | Status: AC
  Administered 2018-10-23: 81 mg via ORAL
  Filled 2018-10-23: qty 1

## 2018-10-23 MED ORDER — LIDOCAINE HCL (PF) 1 % IJ SOLN
INTRAMUSCULAR | Status: DC | PRN
  Administered 2018-10-23: 10 mL

## 2018-10-23 MED ORDER — LABETALOL HCL 5 MG/ML IV SOLN: 10.0000 mg | INTRAVENOUS | Status: AC | PRN

## 2018-10-23 MED ORDER — SODIUM CHLORIDE 0.9% FLUSH
3.0000 mL | Freq: Two times a day (BID) | INTRAVENOUS | Status: DC
  Administered 2018-10-24: 3 mL via INTRAVENOUS

## 2018-10-23 MED ORDER — SODIUM CHLORIDE 0.9 % IV SOLN
INTRAVENOUS | Status: AC
  Administered 2018-10-23: 17:00:00 via INTRAVENOUS

## 2018-10-23 MED ORDER — HYDRALAZINE HCL 20 MG/ML IJ SOLN: 10.0000 mg | INTRAMUSCULAR | Status: AC | PRN

## 2018-10-23 MED ORDER — SODIUM CHLORIDE 0.9 % WEIGHT BASED INFUSION
1.0000 mL/kg/h | INTRAVENOUS | Status: DC
  Administered 2018-10-23: 1 mL/kg/h via INTRAVENOUS

## 2018-10-23 MED ORDER — MIDAZOLAM HCL 2 MG/2ML IJ SOLN
INTRAMUSCULAR | Status: DC | PRN
  Administered 2018-10-23: 1 mg via INTRAVENOUS

## 2018-10-23 MED ORDER — INFLUENZA VAC A&B SA ADJ QUAD 0.5 ML IM PRSY
0.5000 mL | PREFILLED_SYRINGE | INTRAMUSCULAR | Status: AC
  Administered 2018-10-24: 0.5 mL via INTRAMUSCULAR
  Filled 2018-10-23: qty 0.5

## 2018-10-23 MED ORDER — ENSURE ENLIVE PO LIQD
237.0000 mL | Freq: Two times a day (BID) | ORAL | Status: DC
  Administered 2018-10-24: 237 mL via ORAL

## 2018-10-23 MED ORDER — NITROGLYCERIN 0.4 MG SL SUBL
0.4000 mg | SUBLINGUAL_TABLET | SUBLINGUAL | Status: DC | PRN
  Administered 2018-10-23: 0.4 mg via SUBLINGUAL
  Filled 2018-10-23: qty 1

## 2018-10-23 MED ORDER — HEPARIN (PORCINE) IN NACL 1000-0.9 UT/500ML-% IV SOLN
INTRAVENOUS | Status: DC | PRN
  Administered 2018-10-23: 500 mL

## 2018-10-23 MED ORDER — LIDOCAINE HCL (PF) 1 % IJ SOLN
INTRAMUSCULAR | Status: AC
  Filled 2018-10-23: qty 30

## 2018-10-23 MED ORDER — MIDAZOLAM HCL 2 MG/2ML IJ SOLN
INTRAMUSCULAR | Status: AC
  Filled 2018-10-23: qty 2

## 2018-10-23 MED ORDER — NITROGLYCERIN 0.4 MG SL SUBL
SUBLINGUAL_TABLET | SUBLINGUAL | Status: AC
  Administered 2018-10-23: 0.4 mg
  Filled 2018-10-23: qty 1

## 2018-10-23 MED ORDER — SODIUM CHLORIDE 0.9 % IV SOLN: 250.0000 mL | INTRAVENOUS | Status: DC | PRN

## 2018-10-23 MED ORDER — HEPARIN (PORCINE) 25000 UT/250ML-% IV SOLN
350.0000 [IU]/h | INTRAVENOUS | Status: DC
  Administered 2018-10-23: 350 [IU]/h via INTRAVENOUS
  Filled 2018-10-23: qty 250

## 2018-10-23 MED ORDER — SODIUM CHLORIDE 0.9 % WEIGHT BASED INFUSION
3.0000 mL/kg/h | INTRAVENOUS | Status: DC
  Administered 2018-10-23: 3 mL/kg/h via INTRAVENOUS

## 2018-10-23 MED ORDER — IOHEXOL 350 MG/ML SOLN
INTRAVENOUS | Status: DC | PRN
  Administered 2018-10-23: 30 mL

## 2018-10-23 MED ORDER — FENTANYL CITRATE (PF) 100 MCG/2ML IJ SOLN
INTRAMUSCULAR | Status: DC | PRN
  Administered 2018-10-23: 25 ug via INTRAVENOUS

## 2018-10-23 MED ORDER — HEPARIN (PORCINE) IN NACL 1000-0.9 UT/500ML-% IV SOLN
INTRAVENOUS | Status: AC
  Filled 2018-10-23: qty 1000

## 2018-10-23 MED ORDER — SODIUM CHLORIDE 0.9% FLUSH
3.0000 mL | Freq: Two times a day (BID) | INTRAVENOUS | Status: DC
  Administered 2018-10-23: 3 mL via INTRAVENOUS

## 2018-10-23 SURGICAL SUPPLY — 10 items
CATH INFINITI 4FR 145 PIGTAIL (CATHETERS) ×1 IMPLANT
CATH INFINITI 4FR 3 DRC (CATHETERS) ×1 IMPLANT
CATH INFINITI 4FR JL3.5 (CATHETERS) ×2 IMPLANT
KIT HEART LEFT (KITS) ×2 IMPLANT
PACK CARDIAC CATHETERIZATION (CUSTOM PROCEDURE TRAY) ×2 IMPLANT
SHEATH PINNACLE 4F 10CM (SHEATH) ×1 IMPLANT
SHEATH PROBE COVER 6X72 (BAG) ×1 IMPLANT
TRANSDUCER W/STOPCOCK (MISCELLANEOUS) ×2 IMPLANT
TUBING CIL FLEX 10 FLL-RA (TUBING) ×2 IMPLANT
WIRE EMERALD 3MM-J .035X150CM (WIRE) ×1 IMPLANT

## 2018-10-23 NOTE — Progress Notes (Addendum)
  NEUROSURGERY PROGRESS NOTE   No issues overnight.  Developed substernal chest pain while eating. Improved, but not resolved. Feels similar to when she had prior MI. No dyspnea, diaphoresis. Back appropriate sore, but majority of pre op pain resolved No radicular sx  EXAM:  BP (!) 155/80 (BP Location: Left Arm)   Pulse (!) 102   Temp 98.1 F (36.7 C) (Oral)   Resp 16   Ht 4\' 10"  (1.473 m)   Wt 26.8 kg   SpO2 100%   BMI 12.33 kg/m   Awake, alert, oriented  Speech fluent, appropriate  CN grossly intact  5/5 BUE/BLE  Incision c/d/i  IMPRESSION/PLAN 78 y.o. female pod #1 L1 and L4 kyphoplasty. Doing well from surgical standpoint. Now new substernal chest pain - Chest pain: EKG, troponin, CXR. CBC and BMP look normal. D/C pending work up for chest pain  Addendum Trop 866. Stat consult to Cards

## 2018-10-23 NOTE — Evaluation (Signed)
Occupational Therapy Evaluation Patient Details Name: Julie Durham MRN: [id number] DOB: [birth year] Today's Date: 10/23/2018    History of Present Illness Pt is a 78 y/o female s/p L1 and L4 kyphoplasty. PMH includes arthritis, asthma, and HTN.    Clinical Impression   Pt PTA: living alone, fall history, poor vision and cluttered home per daughter. Pt currently supervisionA to minguardA for all ADL tasks. Pt crosses legs for figure 4 technique for LB dressing. Pt performing own toilet hygiene. Pt continues to refuse SNF stating that she and her family will be making plans for ALF soon enough after they  "research everything." Pt minguardA overall for mobility and safety in bathroom and in room with RW.  Back handout provided and reviewed ADL in detail. Pt educated on: clothing between brace, never sleep in brace, set an alarm at night for medication, avoid sitting for long periods of time, correct bed positioning for sleeping, correct sequence for bed mobility, avoiding lifting more than 5 pounds and never wash directly over incision. RN aware of chest pain in beginning of session. Pt would benefit from continued OT skilled services for ADL and visual perception strategies. OT following.      Follow Up Recommendations  Home health OT(pt refusing SNF)    Equipment Recommendations  None recommended by OT    Recommendations for Other Services       Precautions / Restrictions Precautions Precautions: Back Precaution Booklet Issued: Yes (comment) Precaution Comments: Reviewed back precautions with pt Restrictions Weight Bearing Restrictions: No      Mobility Bed Mobility Overal bed mobility: Needs Assistance Bed Mobility: Rolling;Sidelying to Sit;Supine to Sit Rolling: Supervision Sidelying to sit: Supervision Supine to sit: Supervision   Sit to sidelying: Supervision General bed mobility comments: supervisionA for log roll technique + rail use  Transfers Overall transfer  level: Needs assistance Equipment used: Rolling walker (2 wheeled) Transfers: Sit to/from Stand Sit to Stand: Supervision         General transfer comment: supervisionA for safety    Balance Overall balance assessment: Needs assistance Sitting-balance support: No upper extremity supported;Feet supported Sitting balance-Leahy Scale: Good     Standing balance support: Bilateral upper extremity supported;During functional activity Standing balance-Leahy Scale: Fair Standing balance comment: able to brush teeth at sink without supports                           ADL either performed or assessed with clinical judgement   ADL Overall ADL's : Needs assistance/impaired Eating/Feeding: Modified independent   Grooming: Supervision/safety;Cueing for safety;Standing   Upper Body Bathing: Supervision/ safety;Standing   Lower Body Bathing: Min guard;Sitting/lateral leans;Sit to/from stand;Cueing for back precautions;Cueing for safety   Upper Body Dressing : Supervision/safety;Sitting;Cueing for safety   Lower Body Dressing: Min guard;Cueing for safety;Adhering to back precautions;Sitting/lateral leans;Sit to/from stand   Toilet Transfer: Supervision/safety;Cueing for safety;Regular Toilet;RW   Toileting- Water quality scientist and Hygiene: Supervision/safety;Cueing for safety;Sitting/lateral lean;Sit to/from stand;Cueing for back precautions       Functional mobility during ADLs: Min guard;Rolling walker;Cueing for safety General ADL Comments: Pt supervisionA to minguardA for all ADL tasks. Pt crosses legs for figure 4 technique for LB dressing. Pt performing own toilet hygiene.     Vision Baseline Vision/History: No visual deficits       Perception     Praxis      Pertinent Vitals/Pain Pain Assessment: 0-10 Pain Score: 2  Pain Location: chest pain  Pain Descriptors /  Indicators: Discomfort Pain Intervention(s): Premedicated before session     Hand Dominance  Right   Extremity/Trunk Assessment Upper Extremity Assessment Upper Extremity Assessment: Generalized weakness   Lower Extremity Assessment Lower Extremity Assessment: Defer to PT evaluation;Generalized weakness   Cervical / Trunk Assessment Cervical / Trunk Assessment: Other exceptions Cervical / Trunk Exceptions: s/p kyphoplasty   Communication Communication Communication: No difficulties   Cognition Arousal/Alertness: Awake/alert Behavior During Therapy: WFL for tasks assessed/performed Overall Cognitive Status: Within Functional Limits for tasks assessed                                 General Comments: Pt talking alot. Pt WFLs for A/O x4, but poor insight into deficits for safety    General Comments  Pt continues to refuse SNF stating that she and her famiyl will be making plans for ALF soon enough after they  "research everything."    Exercises Exercises: Other exercises Other Exercises Other Exercises: hip/knee flex/ext; leg raises   Shoulder Instructions      Home Living Family/patient expects to be discharged to:: Private residence Living Arrangements: Alone Available Help at Discharge: Family;Available PRN/intermittently Type of Home: House Home Access: Stairs to enter [employer] of Steps: 4-5 Entrance Stairs-Rails: Right;Left Home Layout: One level     Bathroom Shower/Tub: Chief Strategy Officer: Standard Bathroom Accessibility: No   Home Equipment: Environmental consultant - 2 wheels;Cane - quad;Cane - single point   Additional Comments: Per pt's daughter, pt's home very cluttered with multiple small pathways.       Prior Functioning/Environment Level of Independence: Independent with assistive device(s)        Comments: Uses cane. Has had falls at home         OT Problem List: Decreased range of motion;Impaired vision/perception;Decreased safety awareness      OT Treatment/Interventions: Self-care/ADL  training;Therapeutic exercise;Energy conservation;Therapeutic activities;Patient/family education;Balance training;Visual/perceptual remediation/compensation    OT Goals(Current goals can be found in the care plan section) Acute Rehab OT Goals Patient Stated Goal: to go home  OT Goal Formulation: With patient Time For Goal Achievement: 11/06/18 Potential to Achieve Goals: Good ADL Goals Pt Will Perform Grooming: with modified independence;standing Pt Will Perform Lower Body Dressing: with modified independence;sitting/lateral leans;sit to/from stand Additional ADL Goal #1: Pt will use 3 visual perceptual compensatory strategies to ensure a safer environment due to poor vision with minimal verbal cues.  OT Frequency: Min 2X/week   Barriers to D/C: Inaccessible home environment  narrow pathways due to clutter       Co-evaluation              AM-PAC OT "6 Clicks" Daily Activity     Outcome Measure Help from another person eating meals?: None Help from another person taking care of personal grooming?: A Little Help from another person toileting, which includes using toliet, bedpan, or urinal?: A Little Help from another person bathing (including washing, rinsing, drying)?: A Little Help from another person to put on and taking off regular upper body clothing?: None Help from another person to put on and taking off regular lower body clothing?: None 6 Click Score: 21   End of Session Equipment Utilized During Treatment: Rolling walker Nurse Communication: Mobility status  Activity Tolerance: Patient tolerated treatment well Patient left: in chair;with call bell/phone within reach;with nursing/sitter in room  OT Visit Diagnosis: Unsteadiness on feet (R26.81);Muscle weakness (generalized) (M62.81)  Time: [id number] OT Time Calculation (min): 36 min Charges:  OT General Charges $OT Visit: 1 Visit OT Evaluation $OT Eval Moderate Complexity: 1 Mod OT  Treatments $Self Care/Home Management : 8-22 mins  Cristi LoronAllison (Jelenek) [place] OTR/L Acute Rehabilitation Services Pager: [phone] Office: [phone]   Lonzo CloudLLISON J Neera Teng 10/23/2018, 8:35 AM

## 2018-10-23 NOTE — Progress Notes (Signed)
Critical Troponin lab result of 866 was called to Stokes C, PA. Cardiology called by MD to see Pt. Will continue to monitor Pt. Holli Humbles, RN

## 2018-10-23 NOTE — H&P (View-Only) (Signed)
Cardiology Consultation:   Patient ID: Julie Durham MRN: [id number]; DOB: [birth year]  Admit date: 10/22/2018 Date of Consult: 10/23/2018  Primary Care Provider: Patient, No Pcp Per Primary Cardiologist: Nanetta BattyJonathan Berry, MD  Primary Electrophysiologist:  None    Patient Profile:   Julie Durham is a 78 y.o. female with a hx of hypertension, hyperlipidemia, and strong family history of heart disease who is being seen today for the evaluation of elevated troponin at the request of Dr. Conchita ParisNundkumar.  History of Present Illness:   [name] follows with Dr. Allyson SabalBerry for hypertension. She has a strong family history of heart disease. She had a normal nuclear stress test in 2014. She does not have renal artery stenosis or carotid stenosis, by doppler studies. She also has a history of tachypalpitations for which she takes a BB. HTN managed with enalapril and lopressor. She was recently diagnosed with a CNS neoplasm. This will be monitored with imaging, no plans for biopsy or surgery at this time. She was seen by neurosurgery for severe back pain and was deemed appropriate to undergo L1 and L4 kyphoplasty. She [facility] for hypertension and preoperative evaluation for back surgery. She was asymptomatic at that time and cleared for surgery.   She underwent L1 and L4 kyphoplasty on 10/22/18. She tolerated the surgery well. However, on POD #1, she complained of substernal chest pain.   HS troponin was 866 --> 1178.  EKG with sinus tachycardia, similar ST changes when compared to prior  Cardiology was consulted. On my interview, she states that she woke up with chest pain this morning that was severe and radiated under both arms. The pain lasted hours. The pain felt like a pressure and pounding. She has never had this type of chest pain before. The pain has since resolved with nitro. However, pain returns with any movement or exertion.     Heart Pathway Score:     Past Medical History:  Diagnosis Date  . Allergy   . Anemia   . Arthritis   . Asthma   . Brain mass    a. 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  . Cataract   . Encephalitis, viral    [facility]. [facility]  . Hypertension   . Mild mitral regurgitation    a. Echo 2011: LVEF >55% w/ grade 1 DD, mild MR, mild TR, elevated RVSP 30-4340mmHg  . Mild tricuspid regurgitation   . Palpitations     Past Surgical History:  Procedure Laterality Date  . APPENDECTOMY    . CHOLECYSTECTOMY    . DOPPLER ECHOCARDIOGRAPHY  2011  . EYE SURGERY    . TUBAL LIGATION       Home Medications:  Prior to Admission medications   Medication Sig Start Date End Date Taking? Authorizing Provider  acetaminophen (TYLENOL) 500 MG tablet Take 500 mg by mouth daily as needed for mild pain.    Yes [provider]  irbesartan (AVAPRO) 150 MG tablet Take 1 tablet (150 mg total) by mouth daily. 10/21/18  Yes Corrin ParkerGoodrich, Callie E, PA-C  aspirin EC 81 MG tablet Take 81 mg by mouth daily.     [provider]  loratadine (CLARITIN) 10 MG tablet Take 10 mg by mouth daily as needed for  allergies. Patient does not know    [provider]  Magnesium 200 MG TABS Take 200 mg by mouth daily.    [provider]  MAGNESIUM PO Take 1 tablet by mouth daily.    [provider]  metoprolol tartrate (LOPRESSOR) 25 MG tablet TAKE 1/2 TABLET(12.5 MG) BY MOUTH TWICE DAILY Patient taking differently: Take 12.5 mg by mouth 2 (two) times daily. (BETA BLOCKER) 06/06/18   Berry, Jonathan J, MD  Multiple Vitamins-Minerals (PRESERVISION AREDS 2) CAPS Take 1 capsule by mouth 2 (two) times daily.    [provider]  traMADol (ULTRAM) 50 MG tablet Take 1 tablet (50 mg  total) by mouth every 6 (six) hours as needed for severe pain. Patient not taking: Reported on 10/21/2018 09/18/18   Wieters, Hallie C, PA-C  atorvastatin (LIPITOR) 20 MG tablet TAKE 1 TABLET BY MOUTH DAILY 04/16/18 09/18/18  Berry, Jonathan J, MD    Inpatient Medications: Scheduled Meds: . docusate sodium  100 mg Oral BID  . irbesartan  150 mg Oral Daily  . magnesium oxide  200 mg Oral Daily  . metoprolol tartrate  12.5 mg Oral Daily  . senna  1 tablet Oral BID   Continuous Infusions: . sodium chloride    . methocarbamol (ROBAXIN) IV     PRN Meds: acetaminophen **OR** acetaminophen, bisacodyl, HYDROcodone-acetaminophen, HYDROmorphone (DILAUDID) injection, loratadine, menthol-cetylpyridinium **OR** phenol, methocarbamol **OR** methocarbamol (ROBAXIN) IV, nitroGLYCERIN, ondansetron **OR** ondansetron (ZOFRAN) IV, senna-docusate, sodium phosphate, traMADol  Allergies:    Allergies  Allergen Reactions  . Shellfish Allergy Anaphylaxis  . Demerol   . Doxycycline Hyclate     Closed throat, chest pain  . Sulfa Antibiotics     Social History:   Social History   Socioeconomic History  . Marital status: Legally Separated    Spouse name: Not on file  . Number of children: Not on file  . Years of education: Not on file  . Highest education level: Not on file  Occupational History  . Not on file  Social Needs  . Financial resource strain: Not on file  . Food insecurity    Worry: Not on file    Inability: Not on file  . Transportation needs    Medical: Not on file    Non-medical: Not on file  Tobacco Use  . Smoking status: Never Smoker  . Smokeless tobacco: Never Used  Substance and Sexual Activity  . Alcohol use: Never    Frequency: Never  . Drug use: Never  . Sexual activity: Not on file  Lifestyle  . Physical activity    Days per week: Not on file    Minutes per session: Not on file  . Stress: Not on file  Relationships  . Social connections    Talks on phone: Not on  file    Gets together: Not on file    Attends religious service: Not on file    Active member of club or organization: Not on file    Attends meetings of clubs or organizations: Not on file    Relationship status: Not on file  . Intimate partner violence    Fear of current or ex partner: Not on file    Emotionally abused: Not on file    Physically abused: Not on file    Forced sexual activity: Not on file  Other Topics Concern  . Not on file  Social History Narrative  . Not on file    Family History:    Family   History  Problem Relation Age of Onset  . Alzheimer's disease Mother   . Heart attack Father   . Heart disease Brother   . Diabetes Son   . Sensorineural hearing loss Sister   . Heart disease Brother   . Heart disease Brother   . Thyroid disease Brother      ROS:  Please see the history of present illness.   All other ROS reviewed and negative.     Physical Exam/Data:   Vitals:   10/22/18 2317 10/23/18 0531 10/23/18 0711 10/23/18 1207  BP: 133/77 139/89 (!) 155/80 (!) 128/94  Pulse: 70 72 (!) 102 (!) 108  Resp: 14 18 16 16   Temp: 97.6 F (36.4 C) 97.6 F (36.4 C) 98.1 F (36.7 C) 98.1 F (36.7 C)  TempSrc: Oral Oral Oral Oral  SpO2: 98% 97% 100% 100%  Weight:      Height:        Intake/Output Summary (Last 24 hours) at 10/23/2018 1213 Last data filed at 10/23/2018 0815 Gross per 24 hour  Intake 480 ml  Output -  Net 480 ml   Last 3 Weights 10/22/2018 10/21/2018 09/12/2017  Weight (lbs) 59 lb 59 lb 77 lb 3.2 oz  Weight (kg) 26.762 kg 26.762 kg 35.018 kg     Body mass index is 12.33 kg/m.  General: very thin appearing, elderly female, in no acute distress HEENT: normal Neck: no JVD Vascular: No carotid bruits Cardiac:  normal S1, S2; RRR; no murmur  Lungs:  clear to auscultation bilaterally, no wheezing, rhonchi or rales  Abd: soft, nontender, no hepatomegaly  Ext: no edema Musculoskeletal:  No deformities, BUE and BLE strength normal and  equal Skin: warm and dry  Neuro:  CNs 2-12 intact, no focal abnormalities noted Psych:  Normal affect   EKG:  The EKG was personally reviewed and demonstrates:  Sinus tachycardia with HR 118, possible anterior infarct, appears similar to prior tracings Telemetry:  Telemetry was personally reviewed and demonstrates:  NA  Relevant CV Studies:  Echo pending   Nuclear stress test 2014 Normal stress test.  Laboratory Data:  High Sensitivity Troponin:   Recent Labs  Lab 10/23/18 0905 10/23/18 1046  TROPONINIHS 866* 1,178*     Chemistry Recent Labs  Lab 10/21/18 1458 10/22/18 0638 10/23/18 0552  NA 142 138 137  K 3.8 3.7 4.2  CL 101 98 102  CO2 25 28 26   GLUCOSE 84 95 108*  BUN 16 17 13   CREATININE 0.58 0.64 0.41*  CALCIUM 9.1 9.0 9.0  GFRNONAA 89 >60 >60  GFRAA 102 >60 >60  ANIONGAP  --  12 9    No results for input(s): PROT, ALBUMIN, AST, ALT, ALKPHOS, BILITOT in the last 168 hours. Hematology Recent Labs  Lab 10/21/18 1458 10/22/18 0638 10/23/18 0552  WBC 6.0 5.3 7.5  RBC 4.67 4.46 3.96  HGB 13.9 13.4 12.1  HCT 41.5 40.9 35.8*  MCV 89 91.7 90.4  MCH 29.8 30.0 30.6  MCHC 33.5 32.8 33.8  RDW 12.2 12.5 12.7  PLT 411 382 378   BNPNo results for input(s): BNP, PROBNP in the last 168 hours.  DDimer No results for input(s): DDIMER in the last 168 hours.   Radiology/Studies:  Dg Chest 2 View  Result Date: 10/23/2018 CLINICAL DATA:  Chest pain EXAM: CHEST - 2 VIEW COMPARISON:  None. FINDINGS: Hyperinflation/COPD. Biapical scarring. Heart is normal size. Small left pleural effusion. Minimal left base atelectasis or early infiltrate. Right lung clear. There  is diffuse osteopenia. There appear to be multiple mild to moderate compression deformities in the thoracic spine and visualized lumbar spine, age indeterminate. Prior vertebroplasty in the upper lumbar spine. IMPRESSION: COPD. Small left effusion with left base atelectasis or early infiltrate. Multiple  age-indeterminate compression fractures in the thoracic and visualized upper lumbar spine. Electronically Signed   By: Charlett Nose M.D.   On: 10/23/2018 09:14   Dg Lumbar Spine 2-3 Views  Result Date: 10/22/2018 CLINICAL DATA:  L1 and L4 kyphoplasty EXAM: LUMBAR SPINE - 2-3 VIEW; DG C-ARM 1-60 MIN COMPARISON:  Lumbar radiography 09/12/2017 FINDINGS: AP and lateral views of the lumbar spine show cement augmentation in the compressed L1 and L4 vertebral bodies. Some cement over the dorsal canal at L4 in the lateral projection is right lateral on the frontal view and presumably at the level of the pedicle. No venous casting. Known L2 compression fracture. IMPRESSION: Fluoroscopy for L1 and L4 vertebroplasty. Electronically Signed   By: Marnee Spring M.D.   On: 10/22/2018 11:41   Dg C-arm 1-60 Min  Result Date: 10/22/2018 CLINICAL DATA:  L1 and L4 kyphoplasty EXAM: LUMBAR SPINE - 2-3 VIEW; DG C-ARM 1-60 MIN COMPARISON:  Lumbar radiography 09/12/2017 FINDINGS: AP and lateral views of the lumbar spine show cement augmentation in the compressed L1 and L4 vertebral bodies. Some cement over the dorsal canal at L4 in the lateral projection is right lateral on the frontal view and presumably at the level of the pedicle. No venous casting. Known L2 compression fracture. IMPRESSION: Fluoroscopy for L1 and L4 vertebroplasty. Electronically Signed   By: Marnee Spring M.D.   On: 10/22/2018 11:41    Assessment and Plan:   1. Elevated troponin 2. Chest pain - hs trop 866 --> 1178 - EKG with evidence of anterior infarct, but similar to prior - chest pain concerning for unstable angina - nitro x 1 this morning, now chest pain free when resting - chest pain recurs with movement or exertion - plan for definitive angiography this afternoon - will start heparin drip - neurosurgery OK with DAPT, if needed - continue ASA and BB   3. Hypertension - irbesartan and Lopressor  - pressure controlled   4.  Tachypalpitations  - continue BB - sinus tachycardia on EKG  - monitor on telemetry following procedure      For questions or updates, please contact [facility] Please consult [email or web address] for contact info under     Signed, Marcelino Duster, PA  10/23/2018 12:13 PM  I have examined the patient and reviewed assessment and plan and discussed with patient.  Agree with above as stated.    S/p kyphoplasty.  She was kept overnight because of social reasons.  On rounds this AM, patient reported chet pain and troponin was elevated.  ECG stable.  Known CAD in the family.  WOuld plan on cath today.  I discussed the plan with the patient and daughter. Patient is agreeable to the procedure.  Dr. 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   Would not use GP IIb/IIIa inhibitor given recent surgery and her size.  WOuld also consider using Plavix instead of Brilinta since she is underweight.   Lance Muss

## 2018-10-23 NOTE — Consult Note (Addendum)
Cardiology Consultation:   Patient ID: Julie Durham MRN: [id number]; DOB: [birth year]  Admit date: 10/22/2018 Date of Consult: 10/23/2018  Primary Care Provider: Patient, No Pcp Per Primary Cardiologist: Nanetta BattyJonathan Berry, MD  Primary Electrophysiologist:  None    Patient Profile:   Julie Durham is a 78 y.o. female with a hx of hypertension, hyperlipidemia, and strong family history of heart disease who is being seen today for the evaluation of elevated troponin at the request of Dr. Conchita ParisNundkumar.  History of Present Illness:   Julie Durham follows with Dr. Allyson SabalBerry for hypertension. She has a strong family history of heart disease. She had a normal nuclear stress test in 2014. She does not have renal artery stenosis or carotid stenosis, by doppler studies. She also has a history of tachypalpitations for which she takes a BB. HTN managed with enalapril and lopressor. She was recently diagnosed with a CNS neoplasm. This will be monitored with imaging, no plans for biopsy or surgery at this time. She was seen by neurosurgery for severe back pain and was deemed appropriate to undergo L1 and L4 kyphoplasty. She was recently [facility] in [facility] for hypertension and preoperative evaluation for back surgery. She was asymptomatic at that time and cleared for surgery.   She underwent L1 and L4 kyphoplasty on 10/22/18. She tolerated the surgery well. However, on POD #1, she complained of substernal chest pain.   HS troponin was 866 --> 1178.  EKG with sinus tachycardia, similar ST changes when compared to prior  Cardiology was consulted. On my interview, she states that she woke up with chest pain this morning that was severe and radiated under both arms. The pain lasted hours. The pain felt like a pressure and pounding. She has never had this type of chest pain before. The pain has since resolved with nitro. However, pain returns with any movement or exertion.     Heart Pathway Score:     Past Medical History:  Diagnosis Date  . Allergy   . Anemia   . Arthritis   . Asthma   . Brain mass    a. 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  . Cataract   . Encephalitis, viral    a. [facility]  . Hypertension   . Mild mitral regurgitation    a. Echo 2011: LVEF >55% w/ grade 1 DD, mild MR, mild TR, elevated RVSP 30-4340mmHg  . Mild tricuspid regurgitation   . Palpitations     Past Surgical History:  Procedure Laterality Date  . APPENDECTOMY    . CHOLECYSTECTOMY    . DOPPLER ECHOCARDIOGRAPHY  2011  . EYE SURGERY    . TUBAL LIGATION       Home Medications:  Prior to Admission medications   Medication Sig Start Date End Date Taking? Authorizing Provider  acetaminophen (TYLENOL) 500 MG tablet Take 500 mg by mouth daily as needed for mild pain.    Yes [provider]  irbesartan (AVAPRO) 150 MG tablet Take 1 tablet (150 mg total) by mouth daily. 10/21/18  Yes Corrin ParkerGoodrich, Callie E, PA-C  aspirin EC 81 MG tablet Take 81 mg by mouth daily.     [provider]  loratadine (CLARITIN) 10 MG tablet Take 10 mg by mouth daily as needed for  allergies. Patient does not know    [provider]  Magnesium 200 MG TABS Take 200 mg by mouth daily.    [provider]  MAGNESIUM PO Take 1 tablet by mouth daily.    [provider]  metoprolol tartrate (LOPRESSOR) 25 MG tablet TAKE 1/2 TABLET(12.5 MG) BY MOUTH TWICE DAILY Patient taking differently: Take 12.5 mg by mouth 2 (two) times daily. (BETA BLOCKER) 06/06/18   Runell Gess, MD  Multiple Vitamins-Minerals (PRESERVISION AREDS 2) CAPS Take 1 capsule by mouth 2 (two) times daily.    [provider]  traMADol (ULTRAM) 50 MG tablet Take 1 tablet (50 mg  total) by mouth every 6 (six) hours as needed for severe pain. Patient not taking: Reported on 10/21/2018 09/18/18   Wieters, Fran Lowes C, PA-C  atorvastatin (LIPITOR) 20 MG tablet TAKE 1 TABLET BY MOUTH DAILY 04/16/18 09/18/18  Runell Gess, MD    Inpatient Medications: Scheduled Meds: . docusate sodium  100 mg Oral BID  . irbesartan  150 mg Oral Daily  . magnesium oxide  200 mg Oral Daily  . metoprolol tartrate  12.5 mg Oral Daily  . senna  1 tablet Oral BID   Continuous Infusions: . sodium chloride    . methocarbamol (ROBAXIN) IV     PRN Meds: acetaminophen **OR** acetaminophen, bisacodyl, HYDROcodone-acetaminophen, HYDROmorphone (DILAUDID) injection, loratadine, menthol-cetylpyridinium **OR** phenol, methocarbamol **OR** methocarbamol (ROBAXIN) IV, nitroGLYCERIN, ondansetron **OR** ondansetron (ZOFRAN) IV, senna-docusate, sodium phosphate, traMADol  Allergies:    Allergies  Allergen Reactions  . Shellfish Allergy Anaphylaxis  . Demerol   . Doxycycline Hyclate     Closed throat, chest pain  . Sulfa Antibiotics     Social History:   Social History   Socioeconomic History  . Marital status: Legally Separated    Spouse name: Not on file  . Number of children: Not on file  . Years of education: Not on file  . Highest education level: Not on file  Occupational History  . Not on file  Social Needs  . Financial resource strain: Not on file  . Food insecurity    Worry: Not on file    Inability: Not on file  . Transportation needs    Medical: Not on file    Non-medical: Not on file  Tobacco Use  . Smoking status: Never Smoker  . Smokeless tobacco: Never Used  Substance and Sexual Activity  . Alcohol use: Never    Frequency: Never  . Drug use: Never  . Sexual activity: Not on file  Lifestyle  . Physical activity    Days per week: Not on file    Minutes per session: Not on file  . Stress: Not on file  Relationships  . Social Musician on phone: Not on  file    Gets together: Not on file    Attends religious service: Not on file    Active member of club or organization: Not on file    Attends meetings of clubs or organizations: Not on file    Relationship status: Not on file  . Intimate partner violence    Fear of current or ex partner: Not on file    Emotionally abused: Not on file    Physically abused: Not on file    Forced sexual activity: Not on file  Other Topics Concern  . Not on file  Social History Narrative  . Not on file    Family History:    Family  History  Problem Relation Age of Onset  . Alzheimer's disease Mother   . Heart attack Father   . Heart disease Brother   . Diabetes Son   . Sensorineural hearing loss Sister   . Heart disease Brother   . Heart disease Brother   . Thyroid disease Brother      ROS:  Please see the history of present illness.   All other ROS reviewed and negative.     Physical Exam/Data:   Vitals:   10/22/18 2317 10/23/18 0531 10/23/18 0711 10/23/18 1207  BP: 133/77 139/89 (!) 155/80 (!) 128/94  Pulse: 70 72 (!) 102 (!) 108  Resp: 14 18 16 16   Temp: 97.6 F (36.4 C) 97.6 F (36.4 C) 98.1 F (36.7 C) 98.1 F (36.7 C)  TempSrc: Oral Oral Oral Oral  SpO2: 98% 97% 100% 100%  Weight:      Height:        Intake/Output Summary (Last 24 hours) at 10/23/2018 1213 Last data filed at 10/23/2018 0815 Gross per 24 hour  Intake 480 ml  Output -  Net 480 ml   Last 3 Weights 10/22/2018 10/21/2018 09/12/2017  Weight (lbs) 59 lb 59 lb 77 lb 3.2 oz  Weight (kg) 26.762 kg 26.762 kg 35.018 kg     Body mass index is 12.33 kg/m.  General: very thin appearing, elderly female, in no acute distress HEENT: normal Neck: no JVD Vascular: No carotid bruits Cardiac:  normal S1, S2; RRR; no murmur  Lungs:  clear to auscultation bilaterally, no wheezing, rhonchi or rales  Abd: soft, nontender, no hepatomegaly  Ext: no edema Musculoskeletal:  No deformities, BUE and BLE strength normal and  equal Skin: warm and dry  Neuro:  CNs 2-12 intact, no focal abnormalities noted Psych:  Normal affect   EKG:  The EKG was personally reviewed and demonstrates:  Sinus tachycardia with HR 118, possible anterior infarct, appears similar to prior tracings Telemetry:  Telemetry was personally reviewed and demonstrates:  NA  Relevant CV Studies:  Echo pending   Nuclear stress test 2014 Normal stress test.  Laboratory Data:  High Sensitivity Troponin:   Recent Labs  Lab 10/23/18 0905 10/23/18 1046  TROPONINIHS 866* 1,178*     Chemistry Recent Labs  Lab 10/21/18 1458 10/22/18 0638 10/23/18 0552  NA 142 138 137  K 3.8 3.7 4.2  CL 101 98 102  CO2 25 28 26   GLUCOSE 84 95 108*  BUN 16 17 13   CREATININE 0.58 0.64 0.41*  CALCIUM 9.1 9.0 9.0  GFRNONAA 89 >60 >60  GFRAA 102 >60 >60  ANIONGAP  --  12 9    No results for input(s): PROT, ALBUMIN, AST, ALT, ALKPHOS, BILITOT in the last 168 hours. Hematology Recent Labs  Lab 10/21/18 1458 10/22/18 0638 10/23/18 0552  WBC 6.0 5.3 7.5  RBC 4.67 4.46 3.96  HGB 13.9 13.4 12.1  HCT 41.5 40.9 35.8*  MCV 89 91.7 90.4  MCH 29.8 30.0 30.6  MCHC 33.5 32.8 33.8  RDW 12.2 12.5 12.7  PLT 411 382 378   BNPNo results for input(s): BNP, PROBNP in the last 168 hours.  DDimer No results for input(s): DDIMER in the last 168 hours.   Radiology/Studies:  Dg Chest 2 View  Result Date: 10/23/2018 CLINICAL DATA:  Chest pain EXAM: CHEST - 2 VIEW COMPARISON:  None. FINDINGS: Hyperinflation/COPD. Biapical scarring. Heart is normal size. Small left pleural effusion. Minimal left base atelectasis or early infiltrate. Right lung clear. There  is diffuse osteopenia. There appear to be multiple mild to moderate compression deformities in the thoracic spine and visualized lumbar spine, age indeterminate. Prior vertebroplasty in the upper lumbar spine. IMPRESSION: COPD. Small left effusion with left base atelectasis or early infiltrate. Multiple  age-indeterminate compression fractures in the thoracic and visualized upper lumbar spine. Electronically Signed   By: Charlett Nose M.D.   On: 10/23/2018 09:14   Dg Lumbar Spine 2-3 Views  Result Date: 10/22/2018 CLINICAL DATA:  L1 and L4 kyphoplasty EXAM: LUMBAR SPINE - 2-3 VIEW; DG C-ARM 1-60 MIN COMPARISON:  Lumbar radiography 09/12/2017 FINDINGS: AP and lateral views of the lumbar spine show cement augmentation in the compressed L1 and L4 vertebral bodies. Some cement over the dorsal canal at L4 in the lateral projection is right lateral on the frontal view and presumably at the level of the pedicle. No venous casting. Known L2 compression fracture. IMPRESSION: Fluoroscopy for L1 and L4 vertebroplasty. Electronically Signed   By: Marnee Spring M.D.   On: 10/22/2018 11:41   Dg C-arm 1-60 Min  Result Date: 10/22/2018 CLINICAL DATA:  L1 and L4 kyphoplasty EXAM: LUMBAR SPINE - 2-3 VIEW; DG C-ARM 1-60 MIN COMPARISON:  Lumbar radiography 09/12/2017 FINDINGS: AP and lateral views of the lumbar spine show cement augmentation in the compressed L1 and L4 vertebral bodies. Some cement over the dorsal canal at L4 in the lateral projection is right lateral on the frontal view and presumably at the level of the pedicle. No venous casting. Known L2 compression fracture. IMPRESSION: Fluoroscopy for L1 and L4 vertebroplasty. Electronically Signed   By: Marnee Spring M.D.   On: 10/22/2018 11:41    Assessment and Plan:   1. Elevated troponin 2. Chest pain - hs trop 866 --> 1178 - EKG with evidence of anterior infarct, but similar to prior - chest pain concerning for unstable angina - nitro x 1 this morning, now chest pain free when resting - chest pain recurs with movement or exertion - plan for definitive angiography this afternoon - will start heparin drip - neurosurgery OK with DAPT, if needed - continue ASA and BB   3. Hypertension - irbesartan and Lopressor  - pressure controlled   4.  Tachypalpitations  - continue BB - sinus tachycardia on EKG  - monitor on telemetry following procedure      For questions or updates, please contact [facility] Please consult [email or web address] for contact info under     Signed, Marcelino Duster, PA  10/23/2018 12:13 PM  I have examined the patient and reviewed assessment and plan and discussed with patient.  Agree with above as stated.    S/p kyphoplasty.  She was kept overnight because of social reasons.  On rounds this AM, patient reported chet pain and troponin was elevated.  ECG stable.  Known CAD in the family.  WOuld plan on cath today.  I discussed the plan with the patient and daughter. Patient is agreeable to the procedure.  Dr. 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   Would not use GP IIb/IIIa inhibitor given recent surgery and her size.  WOuld also consider using Plavix instead of Brilinta since she is underweight.   Lance Muss

## 2018-10-23 NOTE — Significant Event (Addendum)
Rapid Response Event Note  Overview: Chest Pain   Initial Focused Assessment: Received a call from patient placement about patient needing cardiac telemetry bed. I called the [place] staff, Ms. Schneiderman had acute chest pain this morning, currently was having chest pain, and Cardiology was at the bedside. I came to Ms. Bushee, she was sitting on side of bed, alert/awake, pleasantly conversant, she endorses 1/10 substernal pressure when I arrived, her back was hurting her more than her chest when I arrived. Skin warm and dry, HR 110s-115s, SBP 120s, MAP > 65. Not in acute distress. Already received NTG SL 0.4 mg x 1, per MD, nurse gave another dose, after second dose, chest pressure was gone.   Interventions: -- No RRT Interventions   Plan of Care:  -- Transfer to 6E (currently no 2C/2H beds available).   Event Summary:  Call Time 1145 Arrival Time 1150 ( I was with another patient) End Time [place], [place]

## 2018-10-23 NOTE — Progress Notes (Signed)
Physical Therapy Treatment Patient Details Name: Julie Durham MRN: [id number] DOB: [birth year] Today's Date: 10/23/2018    History of Present Illness Pt is a 78 y/o female s/p L1 and L4 kyphoplasty. PMH includes arthritis, asthma, and HTN.     PT Comments    Pt progressing well with post-op mobility. She was able to demonstrate transfers and ambulation with gross supervision for safety. Pt continues to refuse SNF and noted decreased safety awareness/awareness of deficits throughout functional mobility. She is at risk for falls as pt reports she cannot fit walker through her house because of the "clutter" and has voluntary decreased supervision, stating she has "too much help" and "just wants to be left alone". Reviewed education on precautions, brace application/wearing schedule, appropriate activity progression, and car transfer. Will continue to follow.    Follow Up Recommendations  Home health PT;Supervision for mobility/OOB     Equipment Recommendations  None recommended by PT    Recommendations for Other Services       Precautions / Restrictions Precautions Precautions: Back Precaution Booklet Issued: Yes (comment) Precaution Comments: Reviewed back precautions with pt Restrictions Weight Bearing Restrictions: No    Mobility  Bed Mobility Overal bed mobility: Needs Assistance Bed Mobility: Rolling;Sidelying to Sit;Sit to Sidelying Rolling: Modified independent (Device/Increase time) Sidelying to sit: Supervision Supine to sit: Supervision   Sit to sidelying: Supervision General bed mobility comments: Required railing use. Pt did not allow me to take the railing away as she states she uses her cane hooked on the chair to pull herself up into sitting (sleeps on the couch). Did allow me to flatten [facility] to simulate couch at home.   Transfers Overall transfer level: Needs assistance Equipment used: Rolling walker (2 wheeled) Transfers: Sit to/from Stand Sit to Stand:  Supervision         General transfer comment: VC's for hand placement on seated surface for safety. No assist required but supervision provided for safety.   Ambulation/Gait Ambulation/Gait assistance: Supervision Gait Distance (Feet): 250 Feet Assistive device: Rolling walker (2 wheeled) Gait Pattern/deviations: Step-through pattern;Decreased stride length;Trunk flexed Gait velocity: Decreased Gait velocity interpretation: <1.8 ft/sec, indicate of risk for recurrent falls General Gait Details: Slow but steady with RW. Pt reports she furniture walks at home as she cannot fit her walker through the house with all the clutter. Practiced with pt holding to railing in hall and was able to manage fairly well with closer supervision for safety.    Stairs             Wheelchair Mobility    Modified Rankin (Stroke Patients Only)       Balance Overall balance assessment: Needs assistance Sitting-balance support: No upper extremity supported;Feet supported Sitting balance-Leahy Scale: Good     Standing balance support: Bilateral upper extremity supported;During functional activity Standing balance-Leahy Scale: Fair Standing balance comment: able to brush teeth at sink without supports                            Cognition Arousal/Alertness: Awake/alert Behavior During Therapy: WFL for tasks assessed/performed Overall Cognitive Status: Impaired/Different from baseline Area of Impairment: Safety/judgement;Memory                     Memory: Decreased recall of precautions   Safety/Judgement: Decreased awareness of safety;Decreased awareness of deficits     General Comments: Pt talking alot. Pt WFLs for A/O x4, but poor insight into deficits for  safety       Exercises Other Exercises Other Exercises: hip/knee flex/ext; leg raises    General Comments General comments (skin integrity, edema, etc.): Pt continues to refuse SNF stating that she and her  famiyl will be making plans for ALF soon enough after they  "research everything."      Pertinent Vitals/Pain Pain Assessment: Faces Pain Score: 2  Faces Pain Scale: Hurts a little bit Pain Location: back Pain Descriptors / Indicators: Operative site guarding Pain Intervention(s): Monitored during session    Home Living Family/patient expects to be discharged to:: Private residence Living Arrangements: Alone Available Help at Discharge: Family;Available PRN/intermittently Type of Home: House Home Access: Stairs to enter Entrance Stairs-Rails: Right;Left Home Layout: One level Home Equipment: Environmental consultant - 2 wheels;Cane - quad;Cane - single point Additional Comments: Per pt's daughter, pt's home very cluttered with multiple small pathways.     Prior Function Level of Independence: Independent with assistive device(s)      Comments: Uses cane. Has had falls at home    PT Goals (current goals can now be found in the care plan section) Acute Rehab PT Goals Patient Stated Goal: to go home  PT Goal Formulation: With patient Time For Goal Achievement: 11/05/18 Potential to Achieve Goals: Good Progress towards PT goals: Progressing toward goals    Frequency    Min 5X/week      PT Plan Current plan remains appropriate    Co-evaluation              AM-PAC PT "6 Clicks" Mobility   Outcome Measure  Help needed turning from your back to your side while in a flat bed without using bedrails?: A Little Help needed moving from lying on your back to sitting on the side of a flat bed without using bedrails?: A Little Help needed moving to and from a bed to a chair (including a wheelchair)?: A Little Help needed standing up from a chair using your arms (e.g., wheelchair or bedside chair)?: A Little Help needed to walk in hospital room?: A Little Help needed climbing 3-5 steps with a railing? : A Lot 6 Click Score: 17    End of Session Equipment Utilized During Treatment: Gait  belt Activity Tolerance: Patient tolerated treatment well Patient left: in bed;with call bell/phone within reach;with family/visitor present Nurse Communication: Mobility status PT Visit Diagnosis: Other abnormalities of gait and mobility (R26.89);Pain Pain - part of body: (back)     Time: 3976-7341 PT Time Calculation (min) (ACUTE ONLY): 21 min  Charges:  $Gait Training: 8-22 mins                     Rolinda Roan, PT, DPT Acute Rehabilitation Services Pager: [phone] Office: [phone]    Thelma Comp 10/23/2018, 10:26 AM

## 2018-10-23 NOTE — TOC Initial Note (Signed)
Transition of Care [facility]) - Initial/Assessment Note    Patient Details  Name: Julie Durham MRN: [id number] Date of Birth: [birth year]  [facility] CM/SW Contact:    Sharin Mons, RN Phone Number: 10/23/2018, 12:51 PM  Clinical Narrative:  Pt admitted with Lumbar compression fx, s/p L1 and L4 kyphoplasty. PMH includes arthritis, asthma, and HTN. From home with family.           - c/o CP, 10/14, workup in process  Per PT/OT evaluation/ recommendations: home health PT/OT. NCM shared information. Pt agreeable to home health services. Choice offered, pt without preference. Referral made with [facility] and accepted pending MD's orders and  F2F. Bedside nurse to make MD aware.   TOC team will continue to monitor for needs....   Expected Discharge Plan: [place] Barriers to Discharge: Continued Medical Work up   Patient Goals and CMS Choice Patient states their goals for this hospitalization and ongoing recovery are:: get better and go home CMS Medicare.gov Compare Post Acute Care list provided to:: Patient Choice offered to / list presented to : Patient  Expected Discharge Plan and Services Expected Discharge Plan: [place]   Discharge Planning Services: CM Consult   Living arrangements for the past 2 months: Single Family Home                 DME Arranged: (owns walker, cane)         HH Arranged: PT, OT HH [facility]: [facility] Date [place]: 10/23/18 Time [place]: [place] Representative spoke with at [place]: [place] Arrangements/Services Living arrangements for the past 2 months: [place] Lives with:: Adult Children Patient language and need for interpreter reviewed:: Yes Do you feel safe going back to the place where you live?: Yes      Need for Family Participation in Patient Care: Yes (Comment) Care giver support system in place?:  Yes (comment)   Criminal Activity/Legal Involvement Pertinent to Current Situation/Hospitalization: No - Comment as needed  Activities of Daily Living Home Assistive Devices/Equipment: Cane (specify quad or straight) ADL Screening (condition at time of admission) Patient's cognitive ability adequate to safely complete daily activities?: Yes Is the patient deaf or have difficulty hearing?: No Does the patient have difficulty seeing, even when wearing glasses/contacts?: No Does the patient have difficulty concentrating, remembering, or making decisions?: No Patient able to express need for assistance with ADLs?: Yes Does the patient have difficulty dressing or bathing?: No Independently performs ADLs?: Yes (appropriate for developmental age) Does the patient have difficulty walking or climbing stairs?: Yes Weakness of Legs: Both Weakness of Arms/Hands: None  Permission Sought/Granted Permission sought to share information with : Case Manager, Family Supports Permission granted to share information with : Yes, Verbal Permission Granted  Share Information with NAME: Valori Hollenkamp (Son)[phone] , Leverne Humbles (Daughter)[phone]           Emotional Assessment Appearance:: Appears stated age Attitude/Demeanor/Rapport: Engaged Affect (typically observed): Accepting Orientation: : Oriented to Self, Oriented to Place, Oriented to  Time, Oriented to Situation Alcohol / Substance Use: Not Applicable Psych Involvement: No (comment)  Admission diagnosis:  COMPRESSION FRACTURE OF LUMBAR 1 VERTEBRA Patient Active Problem List   Diagnosis Date Noted  . Lumbar compression fracture ([place]) 10/22/2018  . Hyperlipidemia 02/28/2017  . Oropharyngeal dysphagia 10/04/2016  . Other bursal cyst, left elbow 10/04/2016  . Pill esophagitis 10/04/2016  .  Infected insect bite 06/29/2016  . Retained foreign body in soft tissue 06/29/2016  . Essential hypertension 11/18/2012  . Palpitations 11/18/2012    PCP:  Patient, No Pcp Per Pharmacy:   [id number] [place], [place] - [address] [place] [address] Phone: [phone] Fax: [phone]     Social Determinants of Health (SDOH) Interventions    Readmission Risk Interventions No flowsheet data found.

## 2018-10-23 NOTE — Interval H&P Note (Signed)
History and Physical Interval Note:  10/23/2018 3:55 PM  Julie Durham  has presented today for surgery, with the diagnosis of chest pain.  The various methods of treatment have been discussed with the patient and family. After consideration of risks, benefits and other options for treatment, the patient has consented to  Procedure(s): LEFT HEART CATH AND CORONARY ANGIOGRAPHY (N/A) as a surgical intervention.  The patient's history has been reviewed, patient examined, no change in status, stable for surgery.  I have reviewed the patient's chart and labs.  Questions were answered to the patient's satisfaction.     Sherren Mocha

## 2018-10-23 NOTE — Progress Notes (Addendum)
ANTICOAGULATION CONSULT NOTE - Initial Consult  Pharmacy Consult for heparin  Indication: chest pain/ACS  Allergies  Allergen Reactions  . Doxycycline Hyclate Anaphylaxis    Chest pain  . Shellfish Allergy Anaphylaxis  . Demerol   . Sulfa Antibiotics     Patient Measurements: Height: 4\' 10"  (147.3 cm) Weight: 59 lb (26.8 kg) IBW/kg (Calculated) : 40.9   Vital Signs: Temp: 98.1 F (36.7 C) (10/14 1207) Temp Source: Oral (10/14 1207) BP: 128/94 (10/14 1207) Pulse Rate: 108 (10/14 1207)  Labs: Recent Labs    10/21/18 1458 10/22/18 1062 10/23/18 0552 10/23/18 0905 10/23/18 1046  HGB 13.9 13.4 12.1  --   --   HCT 41.5 40.9 35.8*  --   --   PLT 411 382 378  --   --   APTT  --   --  26  --   --   LABPROT  --   --  13.7  --   --   INR  --   --  1.1  --   --   CREATININE 0.58 0.64 0.41*  --   --   TROPONINIHS  --   --   --  866* 1,178*    Estimated Creatinine Clearance: 24.5 mL/min (A) (by C-G formula based on SCr of 0.41 mg/dL (L)).   Medical History: Past Medical History:  Diagnosis Date  . Allergy   . Anemia   . Arthritis   . Asthma   . Brain mass    a. 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  . Cataract   . Encephalitis, viral    a. 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 Hypertension   . Mild mitral regurgitation    a. Echo 2011: LVEF >55% w/ grade 1 DD, mild MR, mild TR, elevated RVSP 30-1mmHg  . Mild tricuspid regurgitation   . Palpitations     Medications:  Medications Prior to Admission  Medication Sig Dispense Refill Last Dose  . acetaminophen (TYLENOL) 500 MG tablet Take 500 mg by mouth daily as needed for mild pain.      Marland Kitchen aspirin EC 81 MG tablet Take 81 mg by mouth daily.      . irbesartan (AVAPRO) 150 MG tablet Take 1 tablet (150 mg total) by mouth daily. 90  tablet 3   . loratadine (CLARITIN) 10 MG tablet Take 10 mg by mouth daily as needed for allergies. Patient does not know     . Magnesium 200 MG TABS Take 200 mg by mouth daily.     Marland Kitchen MAGNESIUM PO Take 1 tablet by mouth daily.     . metoprolol tartrate (LOPRESSOR) 25 MG tablet TAKE 1/2 TABLET(12.5 MG) BY MOUTH TWICE DAILY (Patient taking differently: Take 12.5 mg by mouth 2 (two) times daily. (BETA BLOCKER)) 90 tablet 1   . Multiple Vitamins-Minerals (PRESERVISION AREDS 2) CAPS Take 1 capsule by mouth 2 (two) times daily.     . traMADol (ULTRAM) 50 MG tablet Take 1 tablet (50 mg total) by mouth every 6 (six) hours as needed for severe pain. (Patient not taking: Reported on 10/21/2018) 12 tablet 0 Not Taking at Unknown time   Scheduled:  . docusate sodium  100 mg Oral BID  . irbesartan  150 mg Oral Daily  . magnesium oxide  200 mg Oral Daily  .  metoprolol tartrate  12.5 mg Oral Daily  . senna  1 tablet Oral BID    Assessment: 78 yo female with CP and elevated troponin. Pharmacy consulted to dose heparin. She is noted with  L1 and L4 osteoporotic compression fractures s/p kyphoplasty on 10/13. -hg= 12.1, plt= 378 -wt= 26.8kg (consistent with clinic visits; previous weight was 36.8kg on 10/21/18)  Goal of Therapy:  Heparin level 0.3-0.7 units/ml Monitor platelets by anticoagulation protocol: Yes   Plan:  -No heparin bolus due to recent procedure -Start heparin infusion at 350 units/hr -Heparin level in 8 hours and daily wth CBC daily   Harland German, PharmD Clinical Pharmacist **Pharmacist phone directory can now be found on [email or web address] ([email or web address]).  Listed under [facility].

## 2018-10-23 NOTE — Progress Notes (Signed)
Pt transferred to 6E13. Pt's daughter notified of room change. Holli Humbles, RN

## 2018-10-23 NOTE — Progress Notes (Signed)
  Echocardiogram 2D Echocardiogram has been performed.  Julie Durham M 10/23/2018, 3:38 PM

## 2018-10-24 ENCOUNTER — Encounter (HOSPITAL_COMMUNITY): Payer: Self-pay | Admitting: Cardiovascular Disease

## 2018-10-24 DIAGNOSIS — I5181 Takotsubo syndrome: Secondary | ICD-10-CM

## 2018-10-24 DIAGNOSIS — R778 Other specified abnormalities of plasma proteins: Secondary | ICD-10-CM

## 2018-10-24 DIAGNOSIS — I519 Heart disease, unspecified: Secondary | ICD-10-CM

## 2018-10-24 DIAGNOSIS — M4846XA Fatigue fracture of vertebra, lumbar region, initial encounter for fracture: Secondary | ICD-10-CM

## 2018-10-24 DIAGNOSIS — I2510 Atherosclerotic heart disease of native coronary artery without angina pectoris: Secondary | ICD-10-CM

## 2018-10-24 DIAGNOSIS — Z9889 Other specified postprocedural states: Secondary | ICD-10-CM

## 2018-10-24 DIAGNOSIS — G9389 Other specified disorders of brain: Secondary | ICD-10-CM

## 2018-10-24 DIAGNOSIS — R079 Chest pain, unspecified: Secondary | ICD-10-CM

## 2018-10-24 HISTORY — DX: Heart disease, unspecified: I51.9

## 2018-10-24 HISTORY — DX: Fatigue fracture of vertebra, lumbar region, initial encounter for fracture: M48.46XA

## 2018-10-24 LAB — LIPID PANEL
Cholesterol: 154 mg/dL (ref 0–200)
HDL: 54 mg/dL (ref >40)
LDL Cholesterol: 65 mg/dL (ref 0–99)
Total CHOL/HDL Ratio: 2.9 RATIO
Triglycerides: 176 mg/dL — ABNORMAL HIGH (ref <150)
VLDL: 35 mg/dL (ref 0–40)

## 2018-10-24 LAB — BASIC METABOLIC PANEL
Anion gap: 7 (ref 5–15)
BUN: 10 mg/dL (ref 8–23)
CO2: 27 mmol/L (ref 22–32)
Calcium: 8.2 mg/dL — ABNORMAL LOW (ref 8.9–10.3)
Chloride: 102 mmol/L (ref 98–111)
Creatinine, Ser: 0.59 mg/dL (ref 0.44–1.00)
GFR calc Af Amer: >60 mL/min (ref >60)
GFR calc non Af Amer: >60 mL/min (ref >60)
Glucose, Bld: 84 mg/dL (ref 70–99)
Potassium: 3.7 mmol/L (ref 3.5–5.1)
Sodium: 136 mmol/L (ref 135–145)

## 2018-10-24 LAB — ECHOCARDIOGRAM COMPLETE
Height: 58 in
Weight: 943.99 oz

## 2018-10-24 LAB — CBC
HCT: 31.2 % — ABNORMAL LOW (ref 36.0–46.0)
Hemoglobin: 10.4 g/dL — ABNORMAL LOW (ref 12.0–15.0)
MCH: 30.1 pg (ref 26.0–34.0)
MCHC: 33.3 g/dL (ref 30.0–36.0)
MCV: 90.4 fL (ref 80.0–100.0)
Platelets: 292 10*3/uL (ref 150–400)
RBC: 3.45 MIL/uL — ABNORMAL LOW (ref 3.87–5.11)
RDW: 13 % (ref 11.5–15.5)
WBC: 6.2 10*3/uL (ref 4.0–10.5)
nRBC: 0 % (ref 0.0–0.2)

## 2018-10-24 MED ORDER — ATORVASTATIN CALCIUM 10 MG PO TABS: 10.0000 mg | ORAL_TABLET | Freq: Every day | ORAL | Status: DC

## 2018-10-24 MED ORDER — ATORVASTATIN CALCIUM 10 MG PO TABS: 10.0000 mg | ORAL_TABLET | Freq: Every day | ORAL | 6 refills | Status: DC

## 2018-10-24 MED ORDER — ASPIRIN EC 81 MG PO TBEC
81.0000 mg | DELAYED_RELEASE_TABLET | Freq: Every day | ORAL | Status: DC
  Administered 2018-10-24: 81 mg via ORAL
  Filled 2018-10-24: qty 1

## 2018-10-24 MED FILL — ATORVASTATIN CALCIUM 10 MG: 10 | 30 days supply | Qty: 30 | Fill #0

## 2018-10-24 NOTE — Progress Notes (Addendum)
Progress Note  Patient Name: Julie BelfastBarbara E Fichera Date of Encounter: 10/24/2018  Primary Cardiologist: Nanetta BattyJonathan Berry, MD   Subjective   Patient is not feeling well this morning. She is having back and neck pain and feels like she can't move her legs. Denies chest pain or shortness of breath.   Inpatient Medications    Scheduled Meds: . docusate sodium  100 mg Oral BID  . feeding supplement (ENSURE ENLIVE)  237 mL Oral BID BM  . influenza vaccine adjuvanted  0.5 mL Intramuscular Tomorrow-1000  . irbesartan  150 mg Oral Daily  . magnesium oxide  200 mg Oral Daily  . metoprolol tartrate  12.5 mg Oral Daily  . senna  1 tablet Oral BID  . sodium chloride flush  3 mL Intravenous Q12H   Continuous Infusions: . sodium chloride    . sodium chloride    . methocarbamol (ROBAXIN) IV     PRN Meds: sodium chloride, acetaminophen **OR** acetaminophen, bisacodyl, HYDROcodone-acetaminophen, HYDROmorphone (DILAUDID) injection, loratadine, menthol-cetylpyridinium **OR** phenol, methocarbamol **OR** methocarbamol (ROBAXIN) IV, nitroGLYCERIN, ondansetron **OR** ondansetron (ZOFRAN) IV, senna-docusate, sodium chloride flush, sodium phosphate, traMADol   Vital Signs    Vitals:   10/23/18 2100 10/24/18 0512 10/24/18 0512 10/24/18 0513  BP:  121/66    Pulse:   68   Resp:      Temp:  98.7 F (37.1 C)    TempSrc:  Oral    SpO2: 93%  97%   Weight:    27.1 kg  Height:        Intake/Output Summary (Last 24 hours) at 10/24/2018 0751 Last data filed at 10/23/2018 2100 Gross per 24 hour  Intake 1214.55 ml  Output -  Net 1214.55 ml   Last 3 Weights 10/24/2018 10/22/2018 10/21/2018  Weight (lbs) 59 lb 11.9 oz 59 lb 59 lb  Weight (kg) 27.1 kg 26.762 kg 26.762 kg      Telemetry    NSR, occasional PVCs, HR 70s - Personally Reviewed  ECG    No new - Personally Reviewed  Physical Exam   GEN: No acute distress.   Neck: No JVD Cardiac: RRR, no murmurs, rubs, or gallops.  Respiratory:  Clear to auscultation bilaterally. GI: Soft, nontender, non-distended  MS: No edema; No deformity. Neuro:  Nonfocal  Psych: Normal affect   Labs    High Sensitivity Troponin:   Recent Labs  Lab 10/23/18 0905 10/23/18 1046  TROPONINIHS 866* 1,178*      Chemistry Recent Labs  Lab 10/21/18 1458 10/22/18 0638 10/23/18 0552  NA 142 138 137  K 3.8 3.7 4.2  CL 101 98 102  CO2 25 28 26   GLUCOSE 84 95 108*  BUN 16 17 13   CREATININE 0.58 0.64 0.41*  CALCIUM 9.1 9.0 9.0  GFRNONAA 89 >60 >60  GFRAA 102 >60 >60  ANIONGAP  --  12 9     Hematology Recent Labs  Lab 10/22/18 0638 10/23/18 0552 10/24/18 0454  WBC 5.3 7.5 6.2  RBC 4.46 3.96 3.45*  HGB 13.4 12.1 10.4*  HCT 40.9 35.8* 31.2*  MCV 91.7 90.4 90.4  MCH 30.0 30.6 30.1  MCHC 32.8 33.8 33.3  RDW 12.5 12.7 13.0  PLT 382 378 292    BNPNo results for input(s): BNP, PROBNP in the last 168 hours.   DDimer No results for input(s): DDIMER in the last 168 hours.   Radiology    Dg Chest 2 View  Result Date: 10/23/2018 CLINICAL DATA:  Chest pain EXAM: CHEST -  2 VIEW COMPARISON:  None. FINDINGS: Hyperinflation/COPD. Biapical scarring. Heart is normal size. Small left pleural effusion. Minimal left base atelectasis or early infiltrate. Right lung clear. There is diffuse osteopenia. There appear to be multiple mild to moderate compression deformities in the thoracic spine and visualized lumbar spine, age indeterminate. Prior vertebroplasty in the upper lumbar spine. IMPRESSION: COPD. Small left effusion with left base atelectasis or early infiltrate. Multiple age-indeterminate compression fractures in the thoracic and visualized upper lumbar spine. Electronically Signed   By: Charlett Nose M.D.   On: 10/23/2018 09:14   Dg Lumbar Spine 2-3 Views  Result Date: 10/22/2018 CLINICAL DATA:  L1 and L4 kyphoplasty EXAM: LUMBAR SPINE - 2-3 VIEW; DG C-ARM 1-60 MIN COMPARISON:  Lumbar radiography 09/12/2017 FINDINGS: AP and lateral  views of the lumbar spine show cement augmentation in the compressed L1 and L4 vertebral bodies. Some cement over the dorsal canal at L4 in the lateral projection is right lateral on the frontal view and presumably at the level of the pedicle. No venous casting. Known L2 compression fracture. IMPRESSION: Fluoroscopy for L1 and L4 vertebroplasty. Electronically Signed   By: Marnee Spring M.D.   On: 10/22/2018 11:41   Dg C-arm 1-60 Min  Result Date: 10/22/2018 CLINICAL DATA:  L1 and L4 kyphoplasty EXAM: LUMBAR SPINE - 2-3 VIEW; DG C-ARM 1-60 MIN COMPARISON:  Lumbar radiography 09/12/2017 FINDINGS: AP and lateral views of the lumbar spine show cement augmentation in the compressed L1 and L4 vertebral bodies. Some cement over the dorsal canal at L4 in the lateral projection is right lateral on the frontal view and presumably at the level of the pedicle. No venous casting. Known L2 compression fracture. IMPRESSION: Fluoroscopy for L1 and L4 vertebroplasty. Electronically Signed   By: Marnee Spring M.D.   On: 10/22/2018 11:41    Cardiac Studies   Left Heart Cath 10/23/18 1. Mild nonobstructive CAD  2. Moderate segmental LV systolic dysfunction in periapical pattern likely secondary to Takotsubo syndrome   Echo 10/23/18 Pending results  Patient Profile     78 y.o. female with a hx of hypertension, hyperlipidemia, and strong family history of heart disease who is being seen for the evaluation of elevated troponin and chest pain, underwent cath showing nonobstructive CAD.   Assessment & Plan    Takotsubo Cardiomyopathy Patient experienced substernal chest pain after L1 and L4 kyphoplasty 10/22/18 - Troponin elevated to 1178 - Cath showed mild nonobstructive CAD, moderate segmental LV systolic dysfunction periapical pattern likely secondary to Takotsubo syndrome - Echo results still pending - Cath site right groin is clean and dry with no signs of hematoma - Hgb 10.4 - Creatinine 0.41 > AM  labs pending - Denies recurrent chest pain or shortness of breath. No leg edema on exam. She is having back and neck pain>>would likely benefit from PT. - Will arrange hospital visit for follow up - Continue Lopressor 12.5 mg BID and Irbesartan 150 mg daily  Nonobstructive CAD/Elevated troponin - Cath showed mild nonobstructive CAD - Hs troponin elevation 866 > 1178. Suspect demand ischemia in the setting of Takostubo  - Would likely benefit from Aspirin with troponin elevation and mild CAD - Will check Lipid panel >>patient might benefit from los dose atorvastatin  HTN - Home meds were Irbesartan 150 mg daily and Lopressor 12.5mg  BID - BP this morning 121/66 - Continue current regimen  Tachypalpitations - continue BB - sinus tachycardia on EKG on admission - HR have since improved to the 70s  For questions or updates, please contact [place] Please consult [email or web address] for contact info under        Signed, Cadence Ninfa Meeker, PA-C  10/24/2018, 7:51 AM    I have examined the patient and reviewed assessment and plan and discussed with patient.  Agree with above as stated.  Patient stable this morning.  She is feeling well and eating her breakfast.  Right groin is stable.  She is willing to go home.  She did have a request that she not have too much help at home.  I had a long conversation with the patient's daughter.  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  Because of this, she does not want to have people in her house such as home health PT.  It is difficult to get her mother to do things for her own benefit at times.  This is been a longstanding issue.  We will plan for discharge on beta-blocker therapy.  Her ejection fraction should improve over the course of the next 4 to 6 weeks.  Discussed with Dr. Kathyrn Sheriff.  He will see the patient before she goes.  Larae Grooms

## 2018-10-24 NOTE — Progress Notes (Signed)
  NEUROSURGERY PROGRESS NOTE   No issues overnight.  Complains of appropriate back soreness No radicular pains  EXAM:  BP 121/66 (BP Location: Right Arm)   Pulse 68   Temp 98.7 F (37.1 C) (Oral)   Resp 15   Ht 4\' 10"  (1.473 m)   Wt 27.1 kg   SpO2 97%   BMI 12.49 kg/m   Awake, alert, oriented  Speech fluent, appropriate  CN grossly intact  5/5 BUE/BLE  Incision c/d/i  IMPRESSION/PLAN 78 y.o. female pod #2 L1 and L4 kyphoplasty. Post op complicated by chest pain secondary to Takotsubo syndrome. Much improved this am. - D/C home

## 2018-10-24 NOTE — Discharge Summary (Addendum)
Discharge Summary    Patient ID: Julie BelfastBarbara E Durham MRN: [id number]; DOB: [birth year]  Admit date: 10/22/2018 Discharge date: 10/24/2018  Primary Care Provider: Patient, No Pcp Per Primary Cardiologist: Nanetta BattyJonathan Berry, MD  Primary Electrophysiologist:  None   Discharge Diagnoses    Principal Problem:   Stress fracture of lumbar vertebra Active Problems:   Essential hypertension   Hyperlipidemia   Lumbar compression fracture ([name])   Takotsubo syndrome   Mild CAD   S/P kyphoplasty   Systolic dysfunction   Elevated troponin   Chest pain    Brain mass   Allergies Allergies  Allergen Reactions  . Doxycycline Hyclate Anaphylaxis    Chest pain  . Shellfish Allergy Anaphylaxis  . Demerol   . Sulfa Antibiotics     Diagnostic Studies/Procedures    Cardiac Catheterization 10/22/18 1. Mild nonobstructive CAD  2. Moderate segmental LV systolic dysfunction in periapical pattern likely secondary to Takotsubo syndrome Julie BelfastBarbara E Durham CARDIAC CATHETERIZATION Order# [id number] Reading physician: Tonny Bollmanooper, Michael, MD Ordering physician: Tonny Bollmanooper, Michael, MD Study date: 10/23/18  Physicians  Panel Physicians Referring Physician Case Authorizing Physician  Tonny Bollmanooper, Michael, MD (Primary)    Procedures  LEFT HEART CATH AND CORONARY ANGIOGRAPHY  Conclusion  1. Mild nonobstructive CAD  2. Moderate segmental LV systolic dysfunction in periapical pattern likely secondary to Takotsubo syndrome  Surgeon Notes    10/22/2018 9:23 AM Op Note signed by Lisbeth RenshawNundkumar, Neelesh, MD  Indications  Non-ST elevation (NSTEMI) myocardial infarction ([name]) [I21.4 (ICD-10-CM)]  Procedural Details  Technical Details INDICATION: NSTEMI. Frail 78 yo woman presents with chest pain and troponin levels trending upward with peak HS-troponin of 1178. Referred for cardiac catheterization and possible PCI.   PROCEDURAL DETAILS:  The right groin is prepped, draped, and anesthetized with 1% lidocaine. Using US  guidance, a 4 French sheath is introduced into the right femoral artery. US images are captured and stored in the patient's chart. Standard Judkins catheters are used for coronary angiography. LV pressure is recorded, ventriculography is performed, and an aortic valve pullback gradient is recorded.  Catheter exchanges are performed over an 0.035  guidewire. There are no immediate procedural complications. The patient is transferred to the post catheterization recovery area for further monitoring.    Estimated blood loss <50 mL.   During this procedure medications were administered to achieve and maintain moderate conscious sedation while the patient's heart rate, blood pressure, and oxygen saturation were continuously monitored and I was present face-to-face 100% of this time.  Medications (Filter: Administrations occurring from 10/23/18 1540 to 10/23/18 1640) (important)  Continuous medications are totaled by the amount administered until 10/23/18 1640.  Medication Rate/Dose/Volume Action  Date Time   fentaNYL (SUBLIMAZE) injection (mcg) 25 mcg Given 10/23/18 1557   Total dose as of 10/23/18 1640        25 mcg        midazolam (VERSED) injection (mg) 1 mg Given 10/23/18 1558   Total dose as of 10/23/18 1640        1 mg        lidocaine (PF) (XYLOCAINE) 1 % injection (mL) 10 mL Given 10/23/18 1605   Total dose as of 10/23/18 1640        10 mL        iohexol (OMNIPAQUE) 350 MG/ML injection (mL) 30 mL Given 10/23/18 1622   Total dose as of 10/23/18 1640        30 mL        Heparin (Porcine) in NaCl  1000-0.9 UT/500ML-% SOLN (mL) 500 mL Given 10/23/18 1558   Total dose as of 10/23/18 1640        500 mL        Heparin (Porcine) in NaCl 1000-0.9 UT/500ML-% SOLN (mL) 500 mL Given 10/23/18 1558   Total dose as of 10/23/18 1640        500 mL        Sedation Time  Sedation Time Physician-1: 20 minutes 35 seconds  Contrast  Medication Name Total Dose  iohexol (OMNIPAQUE) 350 MG/ML injection  30 mL    Radiation/Fluoro  Fluoro time: 2.8 (min) DAP: 7871 (mGycm2) Cumulative Air Kerma: 130 (mGy)  Coronary Findings  Diagnostic Dominance: Right Left Main  Vessel is angiographically normal.  Left Anterior Descending  Vessel is moderate in size. The vessel exhibits minimal luminal irregularities.  Left Circumflex  The vessel exhibits minimal luminal irregularities.  Right Coronary Artery  The vessel exhibits minimal luminal irregularities.  Prox RCA to Mid RCA lesion 30% stenosed  Prox RCA to Mid RCA lesion is 30% stenosed.  Intervention  No interventions have been documented. Wall Motion  Resting               Left Heart  Left Ventricle The left ventricular size is normal. There is moderate left ventricular systolic dysfunction. LV end diastolic pressure is normal. There are LV function abnormalities due to segmental dysfunction. LVEF estimated at 45%  Coronary Diagrams  Diagnostic Dominance: Right    Echo 10/23/18   1. Left ventricular ejection fraction, by visual estimation, is 45 to 50%. The left ventricle has mildly decreased function. Normal left ventricular size. Left ventricular septal wall thickness was moderately increased. Normal left ventricular posterior  wall thickness. There is moderately increased left ventricular hypertrophy.  2. Left ventricular diastolic Doppler parameters are consistent with impaired relaxation pattern of LV diastolic filling.  3. Mildly reduced LVEF with decreased strain patterns. Global hypokinesis of apical segment with more preservation of function at the base, consistent with a possible stress induced cardiomyopathy.  4. Global right ventricle has normal systolic function.The right ventricular size is normal. No increase in right ventricular wall thickness.  5. Left atrial size was normal.  6. Right atrial size was normal.  7. Trivial pericardial effusion is present.  8. The mitral valve is normal in structure. Trace  mitral valve regurgitation. No evidence of mitral stenosis.  9. The tricuspid valve is normal in structure. Tricuspid valve regurgitation was not visualized by color flow Doppler. 10. The aortic valve is tricuspid Aortic valve regurgitation is mild by color flow Doppler. Structurally normal aortic valve, with no evidence of sclerosis or stenosis. 11. The pulmonic valve was normal in structure. Pulmonic valve regurgitation is not visualized by color flow Doppler. 12. Normal pulmonary artery systolic pressure. 13. The inferior vena cava is normal in size with greater than 50% respiratory variability, suggesting right atrial pressure of 3 mmHg. _____________   History of Present Illness     VALMAI VANDENBERGHE is a 78 y.o. female with a history of hypertension, hyperlipidemia, palpitations, and a strong family history of heart disease who was seen in the hospital for chest pain post op day one of kyphoplasty procedure. In the past the patient had followed with Dr. Allyson Sabal for several years. She had a normal nuclear stress test in 2014. She had dopplers of her renal arteries, carotids, and lower extremities that were all normal. She was started on BB for a history of tachypalpitations.  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 She was referred to neurosurgery. Upon further work-up she was found to have 2 stress fractures and was scheduled for a 2 level kyphoplasty after she did not have improvement with conservative treatment. She was sent to cardiology for pre-operative evaluation and seen on 10/21/18 . Patient was deemed an acceptable risk without further testing.   Hospital Course     Consultants: Orthopedics, Neurosurgery  The patient was admitted on 10/22/18 for kyphoplasty due to subacute L1 And L4 osteoporotic compression factors. Patient underwent the procedure without complications. The patient was kept overnight because of social  reasons. On post op day one the patient complained of substernal chest pain that reportedly began that morning and was severe, radiating to both arms. EKG showed sinus tachycardia. BP was 120/65, HR 110s. The pain resolved with NTG x 2. The patient was transferred to cardiology for telemetry monitoring and cardiology was consulted. HS troponin came back elevated at 866 > 1178. BMP and CBC were unremarkable. The patient said she had never had this type of pain before. The patient was on home meds Irbesartan and Lopressor for HTN and tachypalpitations. The patient was started on heparin drip and taken for cath that day. Right groin access was established. Cath showed mild nonobstructive CAD and moderate segmental LV systolic dysfunction in the periapical pattern likely secondary to Takotsubo syndrome. Patient remained stable throughout the procedure. Post procedure cardiology took over as attending for further management of patient's care. Echo was ordered which showed EF 45-50%, moderately LVH, diastolic dysfunction, global hypokinesis most consistent with stress induced cardiomyopathy, trivial pericardial effusion, and mild AR. The right groin cath site remained stable with no signs of hematoma. Follow up labs revealed potassium 3.7, glucose 84, creatinine 0.59, calcium 8.2. CBC showed WBC 6.2, Hgb 10.4. Lipid panel revealed LDL 65, HDL 54, and TG 176. The following day the patient denied recurrent chest pain but did have some residual back and neck pain. She was seen by PT who spoke to the patient about SNF, homehealth, and home PT which the patient refused many times. 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 The patient was seen by neurosurgery and deemed stable to go home. PT again saw patient and recommended SNF (which the patient continued to refuse) as well as PT  and home health. Heart rates were noted to improve to the 80s. BMI was noted to be 24 with a weight of 59 lbs. Recommend protein/Ensure shakes at each meal for weight gain. Will plan to discharge patient home on home meds, Irbesartan and Lopressor. Will start patient on low dose statin, Lipitor 10 mg and Aspirin 81 mg daily for mild CAD. Patient will continued to follow up with neurosurgery for further management of brain mass.   Patient was seen and examined by Dr. Eldridge Dace on 10/24/18 and felt to be stable for discharge. Hospital follow up was arranged.   Did the patient have an acute coronary syndrome (MI, NSTEMI, STEMI, etc) this admission?:  No.   The elevated Troponin was due to the acute medical illness (demand ischemia).  _____________  Discharge Vitals Blood pressure 126/75, pulse 69, temperature 99 F (37.2 C), temperature source Oral, resp. rate 16, height  (1.473 m), weight 27.1 kg, SpO2 99 %.  Filed Weights   10/22/18 0643 10/24/18 0513  Weight: 26.8 kg 27.1 kg    Labs & Radiologic Studies    CBC Recent Labs    10/23/18 0552 10/24/18 0454  WBC 7.5 6.2  HGB 12.1 10.4*  HCT 35.8* 31.2*  MCV 90.4 90.4  PLT 378 292   Basic Metabolic Panel Recent Labs    72/53/66 0552 10/24/18 0836  NA 137 136  K 4.2 3.7  CL 102 102  CO2 26 27  GLUCOSE 108* 84  BUN 13 10  CREATININE 0.41* 0.59  CALCIUM 9.0 8.2*   Liver Function Tests No results for input(s): AST, ALT, ALKPHOS, BILITOT, PROT, ALBUMIN in the last 72 hours. No results for input(s): LIPASE, AMYLASE in the last 72 hours. High Sensitivity Troponin:   Recent Labs  Lab 10/23/18 0905 10/23/18 1046  TROPONINIHS 866* 1,178*    BNP Invalid input(s): POCBNP D-Dimer No results for input(s): DDIMER in the last 72 hours. Hemoglobin A1C No results for input(s): HGBA1C in the last 72 hours. Fasting Lipid Panel Recent Labs    10/24/18 0836  CHOL 154  HDL 54  LDLCALC 65  TRIG 176*  CHOLHDL 2.9   Thyroid  Function Tests No results for input(s): TSH, T4TOTAL, T3FREE, THYROIDAB in the last 72 hours.  Invalid input(s): FREET3 _____________  Dg Chest 2 View  Result Date: 10/23/2018 CLINICAL DATA:  Chest pain EXAM: CHEST - 2 VIEW COMPARISON:  None. FINDINGS: Hyperinflation/COPD. Biapical scarring. Heart is normal size. Small left pleural effusion. Minimal left base atelectasis or early infiltrate. Right lung clear. There is diffuse osteopenia. There appear to be multiple mild to moderate compression deformities in the thoracic spine and visualized lumbar spine, age indeterminate. Prior vertebroplasty in the upper lumbar spine. IMPRESSION: COPD. Small left effusion with left base atelectasis or early infiltrate. Multiple age-indeterminate compression fractures in the thoracic and visualized upper lumbar spine. Electronically Signed   By: Charlett Nose M.D.   On: 10/23/2018 09:14   Dg Lumbar Spine 2-3 Views  Result Date: 10/22/2018 CLINICAL DATA:  L1 and L4 kyphoplasty EXAM: LUMBAR SPINE - 2-3 VIEW; DG C-ARM 1-60 MIN COMPARISON:  Lumbar radiography 09/12/2017 FINDINGS: AP and lateral views of the lumbar spine show cement augmentation in the compressed L1 and L4 vertebral bodies. Some cement over the dorsal canal at L4 in the lateral projection is right lateral on the frontal view and presumably at the level of the pedicle. No venous casting. Known L2 compression fracture. IMPRESSION: Fluoroscopy for L1 and L4 vertebroplasty. Electronically Signed   By: Marnee Spring M.D.   On: 10/22/2018 11:41   Dg C-arm 1-60 Min  Result Date: 10/22/2018 CLINICAL DATA:  L1 and L4 kyphoplasty EXAM: LUMBAR SPINE - 2-3 VIEW; DG C-ARM 1-60 MIN COMPARISON:  Lumbar radiography 09/12/2017 FINDINGS: AP and lateral views of the lumbar spine show cement augmentation in the compressed L1 and L4 vertebral bodies. Some cement over the dorsal canal at L4 in the lateral projection is right lateral on the frontal view and presumably at  the level of the pedicle. No venous casting. Known L2 compression fracture. IMPRESSION: Fluoroscopy for L1 and L4 vertebroplasty. Electronically Signed   By: Marnee Spring M.D.   On: 10/22/2018 11:41   Disposition   Pt is being discharged home today in good condition.  Follow-up Plans & Appointments    Follow-up Information    [facility] Follow up.   Specialty: Home Health Services Why: home health services arranged Contact  information: [address] [place] [address] [phone]        Deberah Pelton, NP Follow up on 11/11/2018.   Specialty: Cardiology Why: Please go to hospital follow up November 2, at 10:30 AM Contact information: [address] [place] [address] [place], [place] .   Specialty: Neurosurgery Contact information: [address] [place] [address] [phone]            Discharge Medications   Allergies as of 10/24/2018      Reactions   Doxycycline Hyclate Anaphylaxis   Chest pain   Shellfish Allergy Anaphylaxis   Demerol    Sulfa Antibiotics       Medication List    STOP taking these medications   traMADol 50 MG tablet Commonly known as: ULTRAM     TAKE these medications   acetaminophen 500 MG tablet Commonly known as: TYLENOL Take 500 mg by mouth daily as needed for mild pain.   aspirin EC 81 MG tablet Take 81 mg by mouth daily.   atorvastatin 10 MG tablet Commonly known as: LIPITOR Take 1 tablet (10 mg total) by mouth daily at 6 PM.   irbesartan 150 MG tablet Commonly known as: AVAPRO Take 1 tablet (150 mg total) by mouth daily.   loratadine 10 MG tablet Commonly known as: CLARITIN Take 10 mg by mouth daily as needed for allergies. Patient does not know   Magnesium 200 MG Tabs Take 200 mg by mouth daily.   MAGNESIUM PO Take 1 tablet by mouth daily.   metoprolol tartrate 25 MG tablet Commonly known as:  LOPRESSOR TAKE 1/2 TABLET(12.5 MG) BY MOUTH TWICE DAILY What changed: See the new instructions.   PreserVision AREDS 2 Caps Take 1 capsule by mouth 2 (two) times daily.          Outstanding Labs/Studies   None  Duration of Discharge Encounter   Greater than 30 minutes including physician time.  Signed, Cadence Ninfa Meeker, PA-C 10/24/2018, 4:10 PM   I have examined the patient and reviewed assessment and plan and discussed with patient.  Agree with above as stated.  Patient stable this morning.  She is feeling well and eating her breakfast.  Right groin is stable.  She is willing to go home.  She did have a request that she not have too much help at home.  I had a long conversation with the patient's daughter.  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  Because of this, she does not want to have people in her house such as home health PT.  It is difficult to get her mother to do things for her own benefit at times.  This is been a longstanding issue.  We will plan for discharge on beta-blocker therapy.  Her ejection fraction should improve over the course of the next 4 to 6 weeks.  Discussed with Dr. Kathyrn Sheriff.     Larae Grooms

## 2018-10-24 NOTE — Progress Notes (Addendum)
Physical Therapy Treatment Patient Details Name: Julie Durham MRN: [id number] DOB: Sep 23, 1940 Today's Date: 10/24/2018    History of Present Illness Pt is a 78 y/o female s/p L1 and L4 kyphoplasty. PMH includes arthritis, asthma, and HTN.     PT Comments    Steady progress with mobility. Pt continues to plan to return home and not interested in SNF. Will continue to follow.  HR to 150's with amb.  Follow Up Recommendations  Home health PT;Supervision for mobility/OOB(pt refusing SNF)     Equipment Recommendations  None recommended by PT    Recommendations for Other Services       Precautions / Restrictions Precautions Precautions: Back Precaution Booklet Issued: Yes (comment) Restrictions Weight Bearing Restrictions: No    Mobility  Bed Mobility Overal bed mobility: Needs Assistance Bed Mobility: Rolling;Sidelying to Sit Rolling: Modified independent (Device/Increase time) Sidelying to sit: Modified independent (Device/Increase time);HOB elevated       General bed mobility comments: Pt using rail.  Transfers Overall transfer level: Needs assistance Equipment used: Rolling walker (2 wheeled) Transfers: Sit to/from Stand Sit to Stand: Supervision         General transfer comment: Assist for safety and verbal cues for hand placement  Ambulation/Gait Ambulation/Gait assistance: Supervision Gait Distance (Feet): 250 Feet Assistive device: Rolling walker (2 wheeled) Gait Pattern/deviations: Step-through pattern;Decreased stride length;Trunk flexed Gait velocity: Decreased Gait velocity interpretation: <1.8 ft/sec, indicate of risk for recurrent falls General Gait Details: Slow but steady with walker. Without walker needs upper extremity support from footboard of bed   Stairs             Wheelchair Mobility    Modified Rankin (Stroke Patients Only)       Balance Overall balance assessment: Needs assistance Sitting-balance support: No upper  extremity supported;Feet supported Sitting balance-Leahy Scale: Good     Standing balance support: During functional activity;No upper extremity supported Standing balance-Leahy Scale: Fair                              Cognition Arousal/Alertness: Awake/alert Behavior During Therapy: WFL for tasks assessed/performed Overall Cognitive Status: No family/caregiver present to determine baseline cognitive functioning Area of Impairment: Safety/judgement;Memory                     Memory: Decreased recall of precautions   Safety/Judgement: Decreased awareness of safety;Decreased awareness of deficits            Exercises      General Comments General comments (skin integrity, edema, etc.): HR to 150's with amb      Pertinent Vitals/Pain Pain Assessment: Faces Faces Pain Scale: Hurts a little bit Pain Location: back Pain Descriptors / Indicators: Operative site guarding Pain Intervention(s): Monitored during session    Home Living                      Prior Function            PT Goals (current goals can now be found in the care plan section) Acute Rehab PT Goals Patient Stated Goal: to go home  Progress towards PT goals: Progressing toward goals    Frequency    Min 5X/week      PT Plan Current plan remains appropriate    Co-evaluation              AM-PAC PT "6 Clicks" Mobility   Outcome Measure  Help needed turning from your back to your side while in a flat bed without using bedrails?: A Little Help needed moving from lying on your back to sitting on the side of a flat bed without using bedrails?: A Little Help needed moving to and from a bed to a chair (including a wheelchair)?: A Little Help needed standing up from a chair using your arms (e.g., wheelchair or bedside chair)?: A Little Help needed to walk in hospital room?: A Little Help needed climbing 3-5 steps with a railing? : A Little 6 Click Score: 18    End  of Session   Activity Tolerance: Patient tolerated treatment well Patient left: with call bell/phone within reach;in chair;with chair alarm set Nurse Communication: Mobility status PT Visit Diagnosis: Other abnormalities of gait and mobility (R26.89);Pain Pain - part of body: (back)     Time: 1041-1101 PT Time Calculation (min) (ACUTE ONLY): 20 min  Charges:  $Gait Training: 8-22 mins                     [facility] PT Acute Rehabilitation Services Pager [phone] Office [phone]    Angelina Ok [facility] 10/24/2018, 1:07 PM

## 2018-10-28 ENCOUNTER — Inpatient Hospital Stay: Payer: Medicare Other | Attending: Neurosurgery

## 2018-11-04 NOTE — Anesthesia Postprocedure Evaluation (Signed)
Anesthesia Post Note  Patient: Julie Durham  Procedure(s) Performed: KYPHOPLASTY LUMBAR ONE, LUMBAR FOUR (Bilateral Spine Lumbar)     Patient location during evaluation: PACU Anesthesia Type: General Level of consciousness: awake and alert Pain management: pain level controlled Vital Signs Assessment: post-procedure vital signs reviewed and stable Respiratory status: spontaneous breathing, nonlabored ventilation, respiratory function stable and patient connected to nasal cannula oxygen Cardiovascular status: blood pressure returned to baseline and stable Postop Assessment: no apparent nausea or vomiting Anesthetic complications: no    Last Vitals:  Vitals:   10/24/18 0512 10/24/18 1416  BP:  126/75  Pulse: 68 69  Resp:  16  Temp:  37.2 C  SpO2: 97% 99%    Last Pain:  Vitals:   10/24/18 1416  TempSrc: Oral  PainSc:                  Saksham Akkerman S

## 2018-11-10 NOTE — Progress Notes (Signed)
Cardiology Clinic Note   Patient Name: Julie Durham Date of Encounter: 11/11/2018  Primary Care Provider:  Patient, No Pcp Per Primary Cardiologist:  Nanetta Batty, MD  Patient Profile    Julie Durham 78 year old female presents for follow-up of her Takotsubo syndrome, mitral/tricuspid/pulmonic valve regurgitation, and hypertension.  Past Medical History    Past Medical History:  Diagnosis Date   Allergy    Anemia    Arthritis    Asthma    Brain mass    a. 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   Cataract    Elevated troponin    Encephalitis, viral    a. 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 Echo 2011: LVEF >55% w/ grade 1 DD, mild MR, mild TR, elevated RVSP 30-33mmHg   Mild tricuspid regurgitation    Palpitations    Takotsubo cardiomyopathy    Past Surgical History:  Procedure Laterality Date   APPENDECTOMY     CHOLECYSTECTOMY     DOPPLER ECHOCARDIOGRAPHY  2011   EYE SURGERY     KYPHOPLASTY Bilateral 10/22/2018   Procedure: KYPHOPLASTY LUMBAR ONE, LUMBAR FOUR;  Surgeon: Lisbeth Renshaw, MD;  Location: [facility] OR;  Service: Neurosurgery;  Laterality: Bilateral;  KYPHOPLASTY LUMBAR ONE, LUMBAR FOUR   LEFT HEART CATH AND CORONARY ANGIOGRAPHY N/A 10/23/2018   Procedure: LEFT HEART CATH AND CORONARY ANGIOGRAPHY;  Surgeon: Tonny Bollman, MD;  Location: [facility] INVASIVE CV LAB;  Service: Cardiovascular;  Laterality: N/A;   TUBAL LIGATION      Allergies  Allergies  Allergen Reactions   Doxycycline Hyclate Anaphylaxis    Chest pain   Shellfish Allergy Anaphylaxis   Demerol    Sulfa Antibiotics     History of Present Illness    Julie Durham has a PMH of  chest pain, Takotsubo syndrome, HTN, HLD, palpitations, mild CAD, Oropharyngeal dysphagia, systolic dysfunction,and brain mass.   She underwent cardiac catheterization on 10/23/2018 by Dr. Excell Seltzer, in the presence of CP and elevated troponin  Post L1 and L4 kyphoplasty,  which showed mild nonobstructive CAD and moderate segmental LV systolic dysfunction secondary to Takotsubo syndrome. Her echocardiogram from 10/23/2018 showed an LVEF of 45-50 %. Left ventricular septal wall thickness was moderately increased and moderately increased LVF. Impaired relaxation, global hypokinesis of apical segment with more preservation of function at the base.   She was seen by Dr. Allyson Durham on 02/28/2017.  During that time she was doing well.  Her tachypalpitations have resolved with metoprolol.  She had stopped taking her hydralazine and low-dose amlodipine several months prior. Her blood pressure was controlled.  She denied chest pain, shortness of breath, and was still mowing her own lawn.  She presents the clinic today and states she continues to have back pain after her kyphoplasty.  She states she has an appointment with her neurologist this week for further evaluation of her back and back pain.  She states that she did not take her blood pressure medication this morning and is having trouble seeing the medications that she needs to take.  Her son and daughter have been helping her around the house and with her medications.  Her daughter Julie Durham was reached on phone and we discussed  strategies for her mom and her blood pressure medication.  Is also stated she had one episode of chest pain after discharge from the hospital that lasted for about an hour and resolved on its own.  This episode was nonexertional and she states she took an aspirin and chewed it which helped with the pain.  Both her and her daughter were educated about the importance of continuing to take her blood pressure medication and the benefit would have on her  recovery.  Both her and her daughter expressed understanding all questions were answered.  She denies, shortness of breath, lower extremity edema, fatigue,  melena, hematuria, hemoptysis, diaphoresis, weakness, presyncope, syncope, orthopnea, and PND.   Home Medications    Prior to Admission medications   Medication Sig Start Date End Date Taking? Authorizing Provider  acetaminophen (TYLENOL) 500 MG tablet Take 500 mg by mouth daily as needed for mild pain.     [provider]  aspirin EC 81 MG tablet Take 81 mg by mouth daily.     [provider]  atorvastatin (LIPITOR) 10 MG tablet Take 1 tablet (10 mg total) by mouth daily at 6 PM. 10/24/18   Furth, Cadence H, PA-C  irbesartan (AVAPRO) 150 MG tablet Take 1 tablet (150 mg total) by mouth daily. 10/21/18   Marjie Skiff E, PA-C  loratadine (CLARITIN) 10 MG tablet Take 10 mg by mouth daily as needed for allergies. Patient does not know    [provider]  Magnesium 200 MG TABS Take 200 mg by mouth daily.    [provider]  MAGNESIUM PO Take 1 tablet by mouth daily.    [provider]  metoprolol tartrate (LOPRESSOR) 25 MG tablet TAKE 1/2 TABLET(12.5 MG) BY MOUTH TWICE DAILY Patient taking differently: Take 12.5 mg by mouth 2 (two) times daily. (BETA BLOCKER) 06/06/18   Runell Gess, MD  Multiple Vitamins-Minerals (PRESERVISION AREDS 2) CAPS Take 1 capsule by mouth 2 (two) times daily.    [provider]    Family History    Family History  Problem Relation Age of Onset   Alzheimer's disease Mother    Heart attack Father    Heart disease Brother    Diabetes Son    Sensorineural hearing loss Sister    Heart disease Brother    Heart disease Brother    Thyroid disease Brother    She indicated that her mother is deceased. She indicated that her father is deceased. She indicated that only one of her four sisters is alive. She indicated that all of her three brothers are  deceased. She indicated that her maternal grandmother is deceased. She indicated that her maternal grandfather is deceased. She indicated that her paternal grandmother is deceased. She indicated that her paternal grandfather is deceased. She indicated that her daughter is alive. She indicated that her son is alive.  Social History    Social History   Socioeconomic History   Marital status: Legally Separated    Spouse name: Not on file   Number of children: Not on file   Years of education: Not on file   Highest education level: Not on file  Occupational History   Not on file  Social Needs   Financial resource strain: Not on file   Food insecurity    Worry: Not on file    Inability: Not on file   Transportation needs    Medical: Not on file    Non-medical: Not on file  Tobacco  Use   Smoking status: Never Smoker   Smokeless tobacco: Never Used  Substance and Sexual Activity   Alcohol use: Never    Frequency: Never   Drug use: Never   Sexual activity: Not on file  Lifestyle   Physical activity    Days per week: Not on file    Minutes per session: Not on file   Stress: Not on file  Relationships   Social connections    Talks on phone: Not on file    Gets together: Not on file    Attends religious service: Not on file    Active member of club or organization: Not on file    Attends meetings of clubs or organizations: Not on file    Relationship status: Not on file   Intimate partner violence    Fear of current or ex partner: Not on file    Emotionally abused: Not on file    Physically abused: Not on file    Forced sexual activity: Not on file  Other Topics Concern   Not on file  Social History Narrative   Not on file     Review of Systems    General:  No chills, fever, night sweats or weight changes.  Cardiovascular:  No chest pain, dyspnea on exertion, edema, orthopnea, palpitations, paroxysmal nocturnal dyspnea. Dermatological: No rash,  lesions/masses Respiratory: No cough, dyspnea Urologic: No hematuria, dysuria Abdominal:   No nausea, vomiting, diarrhea, bright red blood per rectum, melena, or hematemesis Neurologic:  No visual changes, wkns, changes in mental status. All other systems reviewed and are otherwise negative except as noted above.  Physical Exam    VS:  BP (!) 173/87 (BP Location: Left Arm, Patient Position: Sitting, Cuff Size: Small)    Pulse 72    Ht 4\' 10"  (1.473 m)    Wt 62 lb 12.8 oz (28.5 kg)    BMI 13.13 kg/m  , BMI Body mass index is 13.13 kg/m. GEN: Well nourished, well developed, in no acute distress. HEENT: normal. Neck: Supple, no JVD, carotid bruits, or masses. Cardiac: RRR, no murmurs, rubs, or gallops. No clubbing, cyanosis, edema.  Radials/DP/PT 2+ and equal bilaterally.  Respiratory:  Respirations regular and unlabored, clear to auscultation bilaterally. GI: Soft, nontender, nondistended, BS + x 4. MS: no deformity or atrophy. Skin: warm and dry, no rash. Neuro:  Strength and sensation are intact. Psych: Normal affect.  Accessory Clinical Findings    ECG personally reviewed by me today-normal sinus rhythm with sinus arrhythmia 71 bpm- No acute changes  Cardiac catheterization 10/23/2018 1. Mild nonobstructive CAD  2. Moderate segmental LV systolic dysfunction in periapical pattern likely secondary to Takotsubo syndrome  Echocardiogram 10/23/2018 IMPRESSIONS    1. Left ventricular ejection fraction, by visual estimation, is 45 to 50%. The left ventricle has mildly decreased function. Normal left ventricular size. Left ventricular septal wall thickness was moderately increased. Normal left ventricular posterior  wall thickness. There is moderately increased left ventricular hypertrophy.  2. Left ventricular diastolic Doppler parameters are consistent with impaired relaxation pattern of LV diastolic filling.  3. Mildly reduced LVEF with decreased strain patterns. Global  hypokinesis of apical segment with more preservation of function at the base, consistent with a possible stress induced cardiomyopathy.  4. Global right ventricle has normal systolic function.The right ventricular size is normal. No increase in right ventricular wall thickness.  5. Left atrial size was normal.  6. Right atrial size was normal.  7. Trivial pericardial effusion is  present.  8. The mitral valve is normal in structure. Trace mitral valve regurgitation. No evidence of mitral stenosis.  9. The tricuspid valve is normal in structure. Tricuspid valve regurgitation was not visualized by color flow Doppler. 10. The aortic valve is tricuspid Aortic valve regurgitation is mild by color flow Doppler. Structurally normal aortic valve, with no evidence of sclerosis or stenosis. 11. The pulmonic valve was normal in structure. Pulmonic valve regurgitation is not visualized by color flow Doppler. 12. Normal pulmonary artery systolic pressure. 13. The inferior vena cava is normal in size with greater than 50% respiratory variability, suggesting right atrial pressure of 3 mmHg.  Assessment & Plan   1.  Takotsubo cardiomyopathy-cardiac catheterization 10/23/2018 showed mild nonobstructive coronary artery disease with segmental LV systolic dysfunction periapical pattern secondary to Takotsubo syndrome.  Substernal chest pain post L1 and L4 kyphoplasty 10/22/2018.  Echocardiogram 10/23/2018 showed LVEF 45 to 50%Left ventricular septal wall thickness was moderately increased and moderately increased LVF. Impaired relaxation, global hypokinesis of apical segment with more preservation of function at the base.  Continue Lopressor 12.5 mg twice daily Continue irbesartan start 150 mg daily Repeat echocardiogram in 3 months.    Coronary artery disease-nonobstructive CAD with cardiac catheterization on 10/23/2018.  No chest pain today Continue aspirin 81 mg tablet daily Continue atorvastatin 10 mg tablet at  bedtime Heart healthy low-sodium diet Increase physical activity as tolerated  Hyperlipidemia-LDL 65 10/24/2018 Continue atorvastatin 10 mg tablet at bedtime Heart healthy low-sodium high-fiber diet Increase physical activity as tolerated  Essential hypertension-BP today 173/87.  Patient did not take morning blood pressure medications. Continue Lopressor 12.5 mg twice daily Continue irbesartan start 150 mg daily Educated about the importance of medication compliance Keep blood pressure log  Heart healthy low-sodium diet-salty 6 given  Palpitations EKG today shows normal sinus rhythm with sinus arrhythmia 71 BPM.  Occasional palpitations since hospital discharge. Continue metoprolol tartrate 12.5 mg twice daily-encouraged medication compliance Education provided about avoiding  triggers such as chocolate, caffeine, and nicotine.   Disposition: Follow-up with me in 4 weeks and Dr. Allyson SabalBerry in 3 months  Thomasene RippleJesse M. Unity Luepke NP-C      [facility] [address] Office [phone] Fax [phone]

## 2018-11-11 ENCOUNTER — Ambulatory Visit (INDEPENDENT_AMBULATORY_CARE_PROVIDER_SITE_OTHER): Payer: Medicare Other | Admitting: General Practice

## 2018-11-11 ENCOUNTER — Encounter: Payer: Self-pay | Admitting: General Practice

## 2018-11-11 ENCOUNTER — Other Ambulatory Visit: Payer: Self-pay

## 2018-11-11 VITALS — BP 173/87 | HR 72 | Ht <= 58 in | Wt <= 1120 oz

## 2018-11-11 DIAGNOSIS — I5181 Takotsubo syndrome: Secondary | ICD-10-CM

## 2018-11-11 DIAGNOSIS — E7800 Pure hypercholesterolemia, unspecified: Secondary | ICD-10-CM | POA: Diagnosis not present

## 2018-11-11 DIAGNOSIS — I2510 Atherosclerotic heart disease of native coronary artery without angina pectoris: Secondary | ICD-10-CM | POA: Diagnosis not present

## 2018-11-11 DIAGNOSIS — I10 Essential (primary) hypertension: Secondary | ICD-10-CM | POA: Diagnosis not present

## 2018-11-11 DIAGNOSIS — R002 Palpitations: Secondary | ICD-10-CM

## 2018-11-11 NOTE — Patient Instructions (Signed)
Medication Instructions:  The current medical regimen is effective;  continue present plan and medications as directed. Please refer to the Current Medication list given to you today. If you need a refill on your cardiac medications before your next appointment, please call your pharmacy.  Special Instructions: PLACE YOU AM MEDICATION IN ONE AREA AND PM MEDICATION IN ANOTHER AREA  Follow-Up: IN 4 WEEKS WITH JESSE AND 3 MONTHS WITH DR Gwenlyn Found In Person You may see Quay Burow, MD Avanell Shackleton or one of the following Advanced Practice Providers on your designated Care Team:  Kerin Ransom, PA-C [place], [place]  Coletta Memos, [place].    At [facility], you and your health needs are our priority.  As part of our continuing mission to provide you with exceptional heart care, we have created designated Provider Care Teams.  These Care Teams include your primary Cardiologist (physician) and Advanced Practice Providers (APPs -  Physician Assistants and Nurse Practitioners) who all work together to provide you with the care you need, when you need it.  Thank you for choosing [facility] at [facility]!!

## 2018-11-13 DIAGNOSIS — H35033 Hypertensive retinopathy, bilateral: Secondary | ICD-10-CM | POA: Diagnosis not present

## 2018-11-13 DIAGNOSIS — H31091 Other chorioretinal scars, right eye: Secondary | ICD-10-CM | POA: Diagnosis not present

## 2018-11-13 DIAGNOSIS — H353231 Exudative age-related macular degeneration, bilateral, with active choroidal neovascularization: Secondary | ICD-10-CM | POA: Diagnosis not present

## 2018-11-13 DIAGNOSIS — H43813 Vitreous degeneration, bilateral: Secondary | ICD-10-CM | POA: Diagnosis not present

## 2018-11-14 DIAGNOSIS — G939 Disorder of brain, unspecified: Secondary | ICD-10-CM | POA: Diagnosis not present

## 2018-11-19 ENCOUNTER — Other Ambulatory Visit: Payer: Self-pay | Admitting: Cardiovascular Disease

## 2018-12-08 NOTE — Progress Notes (Addendum)
Cardiology Clinic Note   Patient Name: Julie Durham Date of Encounter: 12/09/2018  Primary Care Provider:  Patient, No Pcp Per Primary Cardiologist:  Julie Batty, MD  Patient Profile    Julie Durham. Julie Durham 78 year old female presents for follow-up of her Takotsubo syndrome, mitral/tricuspid/pulmonic valve regurgitation, and hypertension.  Past Medical History    Past Medical History:  Diagnosis Date   Allergy    Anemia    Arthritis    Asthma    Brain mass    a. MRI 08/2016 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 Echo 2011: LVEF >55% w/ grade 1 DD, mild MR, mild TR, elevated RVSP 30-20mmHg   Mild tricuspid regurgitation    Palpitations    Takotsubo cardiomyopathy    Past Surgical History:  Procedure Laterality Date   APPENDECTOMY     CHOLECYSTECTOMY     DOPPLER ECHOCARDIOGRAPHY  2011   EYE SURGERY     KYPHOPLASTY Bilateral 10/22/2018   Procedure: KYPHOPLASTY LUMBAR ONE, LUMBAR FOUR;  Surgeon: Julie Renshaw, MD;  Location: [facility] OR;  Service: Neurosurgery;  Laterality: Bilateral;  KYPHOPLASTY LUMBAR ONE, LUMBAR FOUR   LEFT HEART CATH AND CORONARY ANGIOGRAPHY N/A 10/23/2018   Procedure: LEFT HEART CATH AND CORONARY ANGIOGRAPHY;  Surgeon: Tonny Bollman, MD;  Location: [facility] INVASIVE CV LAB;  Service: Cardiovascular;  Laterality: N/A;   TUBAL LIGATION      Allergies  Allergies  Allergen Reactions   Doxycycline Hyclate Anaphylaxis    Chest pain   Shellfish Allergy Anaphylaxis   Julie Durham    Sulfa Antibiotics     History of Present Illness    [name] has a PMH of  chest pain, Takotsubo syndrome, HTN, HLD, palpitations, mild CAD, Oropharyngeal dysphagia, systolic dysfunction,and brain mass.   She underwent cardiac catheterization on 10/23/2018 by Dr. Excell Durham, in the presence of CP and elevated troponin  Post L1 and L4 kyphoplasty,  which showed mild nonobstructive CAD and moderate segmental LV systolic dysfunction secondary to Takotsubo syndrome. Her echocardiogram from 10/23/2018 showed an LVEF of 45-50 %. Left ventricular septal wall thickness was moderately increased and moderately increased LVF. Impaired relaxation, global hypokinesis of apical segment with more preservation of function at the base.   She was seen by Dr. Allyson Durham on 02/28/2017.  During that time she was doing well.  Her tachypalpitations have resolved with metoprolol.  She had stopped taking her hydralazine and low-dose amlodipine several months prior. Her blood pressure was controlled.  She denied chest pain, shortness of breath, and was still mowing her own lawn.  She was last seen by me on 11/11/2018 during that time  she continued to have back pain after her kyphoplasty.  She stated she had an appointment with her neurologist this week for further evaluation of her back and back pain.  She stated that she did not take her blood pressure medication that morning and was having trouble seeing the medications that she needed to take.  Her son and daughter had been helping her around the house and with her medications.  Her daughter Julie Durham was reached  on phone and we discussed strategies for her mom and her blood pressure medication.  She also stated she had one episode of chest pain after discharge from the hospital that lasted for about an hour and resolved on its own.  This episode was nonexertional and she states she took an aspirin and chewed it which helped with the pain.  Both her and her daughter were educated about the importance of continuing to take her blood pressure medication and the benefit  would have on her recovery.  Both her and her daughter expressed understanding all questions were answered.  She presents the clinic today with her daughter Julie SandyBeth and states her blood pressures been better controlled at home.  They have come up with a system where her medication is divided into her morning and afternoon medications and she is comfortable with the way she is currently taking them.  2 weeks ago as she was stepping out of her shoe she sprained her ankle and presents with a orthopedic brace in place on her left ankle.  She states she is ready to start physical therapy which she hopes will help with her continued back discomfort.  She states she has had occasional chest discomfort that appears muscular in nature.  Her chest discomfort is reproduced with deep inspiration and palpation.  It is nonexertional and she denies dyspnea.  She denies, shortness of breath, lower extremity edema, fatigue,  melena, hematuria, hemoptysis, diaphoresis, weakness, presyncope, syncope, orthopnea, and PND.  Home Medications    Prior to Admission medications   Medication Sig Start Date End Date Taking? Authorizing Provider  acetaminophen (TYLENOL) 500 MG tablet Take 500 mg by mouth daily as needed for mild pain.     [provider]  aspirin EC 81 MG tablet Take 81 mg by mouth daily.     [provider]  atorvastatin (LIPITOR) 10 MG tablet Take 1 tablet (10 mg total) by mouth daily at 6 PM. 10/24/18   Furth, Cadence H, PA-C  irbesartan (AVAPRO) 150 MG tablet Take 1 tablet (150 mg total) by mouth daily. 10/21/18   Marjie SkiffGoodrich, Callie E, PA-C  loratadine (CLARITIN) 10 MG tablet Take 10 mg by mouth daily as needed for allergies. Patient does not know    [provider]  Magnesium 200 MG TABS Take 200 mg by mouth daily.    [provider]  MAGNESIUM PO Take 1 tablet by mouth daily.    [provider]  metoprolol tartrate (LOPRESSOR) 25 MG tablet TAKE 1/2 TABLET(12.5 MG)  BY MOUTH TWICE DAILY 11/19/18   Runell GessBerry, Jonathan J, MD  Multiple Vitamins-Minerals (PRESERVISION AREDS 2) CAPS Take 1 capsule by mouth 2 (two) times daily.    [provider]    Family History    Family History  Problem Relation Age of Onset   Alzheimer's disease Mother    Heart attack Father    Heart disease Brother    Diabetes Son    Sensorineural hearing loss Sister    Heart disease Brother    Heart disease Brother    Thyroid disease Brother    She indicated that her mother is deceased. She indicated that her father is deceased. She indicated that only one of her four sisters is alive. She indicated that all of her three brothers are deceased. She indicated that her maternal grandmother is deceased. She indicated that her maternal grandfather is deceased. She indicated that her paternal grandmother is deceased. She indicated that her paternal grandfather is  deceased. She indicated that her daughter is alive. She indicated that her son is alive.  Social History    Social History   Socioeconomic History   Marital status: Legally Separated    Spouse name: Not on file   Number of children: Not on file   Years of education: Not on file   Highest education level: Not on file  Occupational History   Not on file  Social Needs   Financial resource strain: Not on file   Food insecurity    Worry: Not on file    Inability: Not on file   Transportation needs    Medical: Not on file    Non-medical: Not on file  Tobacco Use   Smoking status: Never Smoker   Smokeless tobacco: Never Used  Substance and Sexual Activity   Alcohol use: Never    Frequency: Never   Drug use: Never   Sexual activity: Not on file  Lifestyle   Physical activity    Days per week: Not on file    Minutes per session: Not on file   Stress: Not on file  Relationships   Social connections    Talks on phone: Not on file    Gets together: Not on file    Attends religious  service: Not on file    Active member of club or organization: Not on file    Attends meetings of clubs or organizations: Not on file    Relationship status: Not on file   Intimate partner violence    Fear of current or ex partner: Not on file    Emotionally abused: Not on file    Physically abused: Not on file    Forced sexual activity: Not on file  Other Topics Concern   Not on file  Social History Narrative   Not on file     Review of Systems    General:  No chills, fever, night sweats or weight changes.  Cardiovascular:  No chest pain, dyspnea on exertion, edema, orthopnea, palpitations, paroxysmal nocturnal dyspnea. Dermatological: No rash, lesions/masses Respiratory: No cough, dyspnea Urologic: No hematuria, dysuria Abdominal:   No nausea, vomiting, diarrhea, bright red blood per rectum, melena, or hematemesis Neurologic:  No visual changes, wkns, changes in mental status. All other systems reviewed and are otherwise negative except as noted above.  Physical Exam    VS:  BP (!) 156/83    Pulse 81    Temp (!) 96.9 F (36.1 C)    Ht 4\' 10"  (1.473 m)    Wt 62 lb (28.1 kg) Comment: weight from 11/11/18   SpO2 93%    BMI 12.96 kg/m  , BMI Body mass index is 12.96 kg/m. GEN: Well nourished, well developed, in no acute distress. HEENT: normal. Neck: Supple, no JVD, carotid bruits, or masses. Cardiac: RRR, no murmurs, rubs, or gallops. No clubbing, cyanosis, edema.  Radials/DP/PT 2+ and equal bilaterally.  Respiratory:  Respirations regular and unlabored, clear to auscultation bilaterally. GI: Soft, nontender, nondistended, BS + x 4. MS: no deformity or atrophy. Skin: warm and dry, no rash. Neuro:  Strength and sensation are intact. Psych: Normal affect.  Accessory Clinical Findings    ECG personally reviewed by me today-none today-   EKG 11/11/2018 normal sinus rhythm with sinus arrhythmia 71 bpm- No acute changes  Cardiac catheterization 10/23/2018 1. Mild  nonobstructive CAD  2. Moderate segmental LV systolic dysfunction in periapical pattern likely secondary to Takotsubo syndrome  Echocardiogram 10/23/2018 IMPRESSIONS  1. Left ventricular ejection fraction, by visual estimation, is 45 to 50%. The left ventricle has mildly decreased function. Normal left ventricular size. Left ventricular septal wall thickness was moderately increased. Normal left ventricular posterior wall thickness. There is moderately increased left ventricular hypertrophy. 2. Left ventricular diastolic Doppler parameters are consistent with impaired relaxation pattern of LV diastolic filling. 3. Mildly reduced LVEF with decreased strain patterns. Global hypokinesis of apical segment with more preservation of function at the base, consistent with a possible stress induced cardiomyopathy. 4. Global right ventricle has normal systolic function.The right ventricular size is normal. No increase in right ventricular wall thickness. 5. Left atrial size was normal. 6. Right atrial size was normal. 7. Trivial pericardial effusion is present. 8. The mitral valve is normal in structure. Trace mitral valve regurgitation. No evidence of mitral stenosis. 9. The tricuspid valve is normal in structure. Tricuspid valve regurgitation was not visualized by color flow Doppler. 10. The aortic valve is tricuspid Aortic valve regurgitation is mild by color flow Doppler. Structurally normal aortic valve, with no evidence of sclerosis or stenosis. 11. The pulmonic valve was normal in structure. Pulmonic valve regurgitation is not visualized by color flow Doppler. 12. Normal pulmonary artery systolic pressure. 13. The inferior vena cava is normal in size with greater than 50% respiratory variability, suggesting right atrial pressure of 3 mmHg.  Assessment & Plan   Essential hypertension-BP today 156/83.    Better controlled at home 130s over 80s according to her daughter that presents  with today. Continue Lopressor 12.5 mg twice daily Continue irbesartan start 150 mg daily Continue to keep blood pressure log  Heart healthy low-sodium diet-salty 6 given   Takotsubo cardiomyopathy-cardiac catheterization 10/23/2018 showed mild nonobstructive coronary artery disease with segmental LV systolic dysfunction periapical pattern secondary to Takotsubo syndrome.  Substernal chest pain post L1 and L4 kyphoplasty 10/22/2018.  Echocardiogram 10/23/2018 showed LVEF 45 to 50%Left ventricular septal wall thickness was moderately increased and moderately increased LVF. Impaired relaxation, global hypokinesis of apical segment with more preservation of function at the base.  Continue Lopressor 12.5 mg twice daily Continue irbesartan start 150 mg daily Repeat echocardiogram in 2 months.    Coronary artery disease-nonobstructive CAD with cardiac catheterization on 10/23/2018.  No chest pain today Continue aspirin 81 mg tablet daily Continue atorvastatin 10 mg tablet at bedtime Heart healthy low-sodium diet Increase physical activity as tolerated-patient working with physical therapy to help with continued back pain as well.  Hyperlipidemia-LDL 65 10/24/2018 Continue atorvastatin 10 mg tablet at bedtime Heart healthy low-sodium high-fiber diet Increase physical activity as tolerated  Palpitations- heart rate today 81.   She has had occasional mild palpitations in stressful situations which are relieved with rest and brief in nature.   Continue metoprolol tartrate 12.5 mg twice daily-encouraged medication compliance Education provided about avoiding  triggers such as chocolate, caffeine, and nicotine.   Disposition: Follow-up with Dr. Allyson Durham in 3 months.  Thomasene Ripple. Niklaus Mamaril NP-C      [facility] [address] Office [phone] Fax [phone]

## 2018-12-09 ENCOUNTER — Other Ambulatory Visit: Payer: Self-pay

## 2018-12-09 ENCOUNTER — Ambulatory Visit: Payer: Medicare Other | Admitting: General Practice

## 2018-12-09 ENCOUNTER — Encounter: Payer: Self-pay | Admitting: General Practice

## 2018-12-09 VITALS — BP 156/83 | HR 81 | Temp 96.9°F | Ht <= 58 in | Wt <= 1120 oz

## 2018-12-09 DIAGNOSIS — I10 Essential (primary) hypertension: Secondary | ICD-10-CM

## 2018-12-09 DIAGNOSIS — M25572 Pain in left ankle and joints of left foot: Secondary | ICD-10-CM | POA: Diagnosis not present

## 2018-12-09 DIAGNOSIS — I5181 Takotsubo syndrome: Secondary | ICD-10-CM

## 2018-12-09 DIAGNOSIS — E785 Hyperlipidemia, unspecified: Secondary | ICD-10-CM

## 2018-12-09 DIAGNOSIS — I2510 Atherosclerotic heart disease of native coronary artery without angina pectoris: Secondary | ICD-10-CM

## 2018-12-09 DIAGNOSIS — R002 Palpitations: Secondary | ICD-10-CM | POA: Diagnosis not present

## 2018-12-09 NOTE — Patient Instructions (Signed)
Testing/Procedures: Echocardiogram - Your physician has requested that you have an echocardiogram. Echocardiography is a painless test that uses sound waves to create images of your heart. It provides your doctor with information about the size and shape of your heart and how well your heart's chambers and valves are working. This procedure takes approximately one hour. There are no restrictions for this procedure. This will be performed at our [facility] location - [address], [address].  Follow-Up: KEEP SCHEDULED DR BERRY APPT.    At [facility], you and your health needs are our priority.  As part of our continuing mission to provide you with exceptional heart care, we have created designated Provider Care Teams.  These Care Teams include your primary Cardiologist (physician) and Advanced Practice Providers (APPs -  Physician Assistants and Nurse Practitioners) who all work together to provide you with the care you need, when you need it.  Thank you for choosing [facility] at [facility]!!

## 2018-12-16 DIAGNOSIS — M79672 Pain in left foot: Secondary | ICD-10-CM | POA: Diagnosis not present

## 2018-12-23 ENCOUNTER — Emergency Department (HOSPITAL_COMMUNITY): Payer: Medicare Other

## 2018-12-23 ENCOUNTER — Inpatient Hospital Stay (HOSPITAL_COMMUNITY)
Admission: EM | Admit: 2018-12-23 | Discharge: 2018-12-25 | DRG: 562 | Disposition: A | Discharge status: Skilled Nursing Facility | Payer: Medicare Other | Attending: Internal Medicine | Admitting: Internal Medicine

## 2018-12-23 ENCOUNTER — Encounter (HOSPITAL_COMMUNITY): Payer: Self-pay

## 2018-12-23 DIAGNOSIS — M25561 Pain in right knee: Secondary | ICD-10-CM

## 2018-12-23 DIAGNOSIS — I10 Essential (primary) hypertension: Secondary | ICD-10-CM | POA: Diagnosis not present

## 2018-12-23 DIAGNOSIS — Z8249 Family history of ischemic heart disease and other diseases of the circulatory system: Secondary | ICD-10-CM | POA: Diagnosis not present

## 2018-12-23 DIAGNOSIS — S82124A Nondisplaced fracture of lateral condyle of right tibia, initial encounter for closed fracture: Principal | ICD-10-CM | POA: Diagnosis present

## 2018-12-23 DIAGNOSIS — E7800 Pure hypercholesterolemia, unspecified: Secondary | ICD-10-CM | POA: Diagnosis not present

## 2018-12-23 DIAGNOSIS — S82101A Unspecified fracture of upper end of right tibia, initial encounter for closed fracture: Secondary | ICD-10-CM | POA: Diagnosis not present

## 2018-12-23 DIAGNOSIS — J45909 Unspecified asthma, uncomplicated: Secondary | ICD-10-CM | POA: Diagnosis not present

## 2018-12-23 DIAGNOSIS — Z885 Allergy status to narcotic agent status: Secondary | ICD-10-CM | POA: Diagnosis not present

## 2018-12-23 DIAGNOSIS — Z7982 Long term (current) use of aspirin: Secondary | ICD-10-CM

## 2018-12-23 DIAGNOSIS — Z79899 Other long term (current) drug therapy: Secondary | ICD-10-CM

## 2018-12-23 DIAGNOSIS — E43 Unspecified severe protein-calorie malnutrition: Secondary | ICD-10-CM | POA: Diagnosis not present

## 2018-12-23 DIAGNOSIS — Z91013 Allergy to seafood: Secondary | ICD-10-CM | POA: Diagnosis not present

## 2018-12-23 DIAGNOSIS — M25461 Effusion, right knee: Secondary | ICD-10-CM | POA: Diagnosis not present

## 2018-12-23 DIAGNOSIS — Z882 Allergy status to sulfonamides status: Secondary | ICD-10-CM

## 2018-12-23 DIAGNOSIS — I2510 Atherosclerotic heart disease of native coronary artery without angina pectoris: Secondary | ICD-10-CM | POA: Diagnosis present

## 2018-12-23 DIAGNOSIS — Z888 Allergy status to other drugs, medicaments and biological substances status: Secondary | ICD-10-CM | POA: Diagnosis not present

## 2018-12-23 DIAGNOSIS — Y92008 Other place in unspecified non-institutional (private) residence as the place of occurrence of the external cause: Secondary | ICD-10-CM

## 2018-12-23 DIAGNOSIS — S82391A Other fracture of lower end of right tibia, initial encounter for closed fracture: Secondary | ICD-10-CM

## 2018-12-23 DIAGNOSIS — Z87892 Personal history of anaphylaxis: Secondary | ICD-10-CM | POA: Diagnosis not present

## 2018-12-23 DIAGNOSIS — W07XXXA Fall from chair, initial encounter: Secondary | ICD-10-CM | POA: Diagnosis present

## 2018-12-23 DIAGNOSIS — Z681 Body mass index (BMI) 19 or less, adult: Secondary | ICD-10-CM

## 2018-12-23 DIAGNOSIS — Z743 Need for continuous supervision: Secondary | ICD-10-CM | POA: Diagnosis not present

## 2018-12-23 DIAGNOSIS — S82201A Unspecified fracture of shaft of right tibia, initial encounter for closed fracture: Secondary | ICD-10-CM | POA: Diagnosis present

## 2018-12-23 DIAGNOSIS — E785 Hyperlipidemia, unspecified: Secondary | ICD-10-CM | POA: Diagnosis present

## 2018-12-23 DIAGNOSIS — Z03818 Encounter for observation for suspected exposure to other biological agents ruled out: Secondary | ICD-10-CM | POA: Diagnosis not present

## 2018-12-23 DIAGNOSIS — Z20828 Contact with and (suspected) exposure to other viral communicable diseases: Secondary | ICD-10-CM | POA: Diagnosis present

## 2018-12-23 LAB — COMPREHENSIVE METABOLIC PANEL
ALT: 13 U/L (ref 0–44)
AST: 18 U/L (ref 15–41)
Albumin: 3.4 g/dL — ABNORMAL LOW (ref 3.5–5.0)
Alkaline Phosphatase: 167 U/L — ABNORMAL HIGH (ref 38–126)
Anion gap: 10 (ref 5–15)
BUN: 16 mg/dL (ref 8–23)
CO2: 29 mmol/L (ref 22–32)
Calcium: 8.5 mg/dL — ABNORMAL LOW (ref 8.9–10.3)
Chloride: 101 mmol/L (ref 98–111)
Creatinine, Ser: 0.47 mg/dL (ref 0.44–1.00)
GFR calc Af Amer: >60 mL/min (ref >60)
GFR calc non Af Amer: >60 mL/min (ref >60)
Glucose, Bld: 77 mg/dL (ref 70–99)
Potassium: 3.8 mmol/L (ref 3.5–5.1)
Sodium: 140 mmol/L (ref 135–145)
Total Bilirubin: 1 mg/dL (ref 0.3–1.2)
Total Protein: 6.1 g/dL — ABNORMAL LOW (ref 6.5–8.1)

## 2018-12-23 LAB — CBC WITH DIFFERENTIAL/PLATELET
Abs Immature Granulocytes: 0.04 10*3/uL (ref 0.00–0.07)
Basophils Absolute: 0 10*3/uL (ref 0.0–0.1)
Basophils Relative: 0 %
Eosinophils Absolute: 0 10*3/uL (ref 0.0–0.5)
Eosinophils Relative: 0 %
HCT: 37 % (ref 36.0–46.0)
Hemoglobin: 11.8 g/dL — ABNORMAL LOW (ref 12.0–15.0)
Immature Granulocytes: 1 %
Lymphocytes Relative: 18 %
Lymphs Abs: 1.3 10*3/uL (ref 0.7–4.0)
MCH: 30.9 pg (ref 26.0–34.0)
MCHC: 31.9 g/dL (ref 30.0–36.0)
MCV: 96.9 fL (ref 80.0–100.0)
Monocytes Absolute: 0.5 10*3/uL (ref 0.1–1.0)
Monocytes Relative: 6 %
Neutro Abs: 5.5 10*3/uL (ref 1.7–7.7)
Neutrophils Relative %: 75 %
Platelets: 294 10*3/uL (ref 150–400)
RBC: 3.82 MIL/uL — ABNORMAL LOW (ref 3.87–5.11)
RDW: 13 % (ref 11.5–15.5)
WBC: 7.3 10*3/uL (ref 4.0–10.5)
nRBC: 0 % (ref 0.0–0.2)

## 2018-12-23 LAB — I-STAT CHEM 8, ED
BUN: 16 mg/dL (ref 8–23)
Calcium, Ion: 1.13 mmol/L — ABNORMAL LOW (ref 1.15–1.40)
Chloride: 98 mmol/L (ref 98–111)
Creatinine, Ser: 0.5 mg/dL (ref 0.44–1.00)
Glucose, Bld: 78 mg/dL (ref 70–99)
HCT: 35 % — ABNORMAL LOW (ref 36.0–46.0)
Hemoglobin: 11.9 g/dL — ABNORMAL LOW (ref 12.0–15.0)
Potassium: 3.8 mmol/L (ref 3.5–5.1)
Sodium: 136 mmol/L (ref 135–145)
TCO2: 30 mmol/L (ref 22–32)

## 2018-12-23 LAB — SARS CORONAVIRUS 2 (TAT 6-24 HRS): SARS Coronavirus 2: NEGATIVE

## 2018-12-23 LAB — CK: Total CK: 146 U/L (ref 38–234)

## 2018-12-23 MED ORDER — ACETAMINOPHEN 650 MG RE SUPP: 650.0000 mg | Freq: Four times a day (QID) | RECTAL | Status: DC | PRN

## 2018-12-23 MED ORDER — METOPROLOL TARTRATE 5 MG/5ML IV SOLN
5.0000 mg | Freq: Once | INTRAVENOUS | Status: DC
  Filled 2018-12-23 (×3): qty 5

## 2018-12-23 MED ORDER — ACETAMINOPHEN 325 MG PO TABS: 650.0000 mg | ORAL_TABLET | Freq: Four times a day (QID) | ORAL | Status: DC | PRN

## 2018-12-23 MED ORDER — IRBESARTAN 150 MG PO TABS
150.0000 mg | ORAL_TABLET | Freq: Every day | ORAL | Status: DC
  Administered 2018-12-23 – 2018-12-25 (×3): 150 mg via ORAL
  Filled 2018-12-23 (×3): qty 1

## 2018-12-23 MED ORDER — PROCHLORPERAZINE EDISYLATE 10 MG/2ML IJ SOLN
5.0000 mg | Freq: Four times a day (QID) | INTRAMUSCULAR | Status: DC | PRN
  Administered 2018-12-24: 5 mg via INTRAVENOUS
  Filled 2018-12-23: qty 2

## 2018-12-23 MED ORDER — ACETAMINOPHEN 500 MG PO TABS
1000.0000 mg | ORAL_TABLET | Freq: Once | ORAL | Status: AC
  Administered 2018-12-23: 13:00:00 1000 mg via ORAL
  Filled 2018-12-23: qty 2

## 2018-12-23 MED ORDER — ASPIRIN EC 81 MG PO TBEC
81.0000 mg | DELAYED_RELEASE_TABLET | Freq: Every day | ORAL | Status: DC
  Administered 2018-12-23 – 2018-12-25 (×3): 81 mg via ORAL
  Filled 2018-12-23 (×3): qty 1

## 2018-12-23 MED ORDER — OXYCODONE HCL ER 10 MG PO T12A
10.0000 mg | EXTENDED_RELEASE_TABLET | Freq: Two times a day (BID) | ORAL | Status: DC
  Administered 2018-12-23 – 2018-12-25 (×4): 10 mg via ORAL
  Filled 2018-12-23 (×5): qty 1

## 2018-12-23 MED ORDER — METOPROLOL TARTRATE 12.5 MG HALF TABLET
12.5000 mg | ORAL_TABLET | Freq: Two times a day (BID) | ORAL | Status: DC
  Administered 2018-12-23 – 2018-12-25 (×4): 12.5 mg via ORAL
  Filled 2018-12-23 (×4): qty 1

## 2018-12-23 MED ORDER — KETOROLAC TROMETHAMINE 15 MG/ML IJ SOLN: 15.0000 mg | Freq: Once | INTRAMUSCULAR | Status: DC

## 2018-12-23 MED ORDER — FENTANYL CITRATE (PF) 100 MCG/2ML IJ SOLN: 12.5000 ug | INTRAMUSCULAR | Status: DC | PRN

## 2018-12-23 MED ORDER — ENOXAPARIN SODIUM 300 MG/3ML IJ SOLN
15.0000 mg | INTRAMUSCULAR | Status: DC
  Administered 2018-12-23 – 2018-12-24 (×2): 15 mg via SUBCUTANEOUS
  Filled 2018-12-23 (×3): qty 0.15

## 2018-12-23 NOTE — Progress Notes (Addendum)
CSW spoke to the pt's son who is bedside w/ pt and POA.  Per pt/pt's family pt's relative used to do work with and prefers [facility] and as such pt/pt's family would like to seek admission to [place] at D/C from [employer].  CSW spoke with pt/pt's son and confirmed pt's plan to be discharged to [facility] to for rehab at discharge.  CSW provided active listening and validated pt's son's concerns.   CSW Dept was provide verbal permission from pt/pt's son to complete FL-2 and send referrals out to all SNF facilities in the [place] area, via the hub per pt's request, so that pt has more choices should [facility] be full.  Pt has been living independently prior to being admitted to [facility], per pt's son.  Pt's son provided verbal permission for the CSW to speak to [employer] w/ [facility] and gain collateral.  Pt's son voiced understanding pt may be ready for D/C and that [place] may not have an available bed and that pt may have to D/C to another SNF with available beds with the option to either stay or transfer to [facility] later is an bed becomes available and pt's son voiced understanding.  Pt's son was appreciative and thanked the CSW.  CSW will continue to follow for D/C needs.  Alphonse Guild. Cori Henningsen, LCSW, LCAS, CSI Transitions of Care Clinical Social Worker Care Coordination Department Ph: [phone]

## 2018-12-23 NOTE — Progress Notes (Signed)
CSW completed an assessment, created an FL-2 which was signed by the provider and sent it out with referrals to all the [facility] area SNF's.  2nd shift ED CSW will leave handoff for 1st shift CSW.  CSW will continue to follow for D/C needs.  Alphonse Guild. Nicolas Banh, LCSW, LCAS, CSI Transitions of Care Clinical Social Worker Care Coordination Department Ph: [phone]

## 2018-12-23 NOTE — ED Triage Notes (Signed)
Pt woke up at 0700. Pt was on floor 1-3 hours, slipping form couch to floor. Pt c/o right knee pain. Pt &O x 4. No head injury. 156/88 88 96% 98.2

## 2018-12-23 NOTE — H&P (Signed)
History and Physical    Julie Durham PFX:[id number] DOB: Aug 12, 1940 [name]: 12/23/2018  PCP: Patient, No Pcp Per   Patient coming from: Home.  I have personally briefly reviewed patient's old medical records in [facility]  Chief Complaint: Fall.  HPI: Julie RANDHAWA is a 78 y.o. female with medical history significant of seasonal allergies, normocytic anemia, osteoarthritis, asthma, history of brain mass (see below for details), cataracts, history of viral encephalitis, hyperlipidemia, hypertension, mild mitral regurgitation, mild tricuspid regurgitation, Takotsubo cardiomyopathy, palpitations, mild CAD who is brought to the emergency department after sleeping off her couch and falling to the floor at home injuring her right knee around 0700.   She states that she was on the floor for about 3 hours before she was able to contact anybody.  She states that this was an accident and denies chest pain, dyspnea, palpitations, dizziness, diaphoresis, nausea, emesis, focal weakness or numbness.  She also denies fever, chills, headache, abdominal pain, constipation, melena or hematochezia.  She occasionally gets loose stools.  She denies dysuria, frequency or hematuria.  No polyuria, polydipsia, polyphagia or blurred vision.  ED Course: Her CBC showed a white count of 11.8 g/dL, but was otherwise normal.  CMP shows total protein of 6.1 albumin of 3.4 g/dL.  Her alkaline phosphatase was 767 units/L.  All other values are within normal range.  Imaging on CT shows osseous contusion versus nondisplaced fracture of the posterior lateral tibia with a trace joint effusion.  There is mild to moderate tricompartmental osteoarthritis.  Diffuse osteopenia.  Please see images and full radiology report for further details.  Review of Systems: As per HPI otherwise 10 point review of systems negative.   Past Medical History:  Diagnosis Date  . Allergy   . Anemia   . Arthritis   . Asthma   . Brain mass    a. 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  . Cataract   . Elevated troponin   . Encephalitis, viral    a. 08/2018: diagnosed with VZV encephalitis at [facility]  . Hyperlipidemia   . Hypertension   . Mild CAD per cath 10/2018   . Mild mitral regurgitation    a. Echo 2011: LVEF >55% w/ grade 1 DD, mild MR, mild TR, elevated RVSP 30-44mmHg  . Mild tricuspid regurgitation   . Palpitations   . Takotsubo cardiomyopathy     Past Surgical History:  Procedure Laterality Date  . APPENDECTOMY    . CHOLECYSTECTOMY    . DOPPLER ECHOCARDIOGRAPHY  2011  . EYE SURGERY    . KYPHOPLASTY Bilateral 10/22/2018   Procedure: KYPHOPLASTY LUMBAR ONE, LUMBAR FOUR;  Surgeon: Lisbeth Renshaw, MD;  Location: [facility] OR;  Service: Neurosurgery;  Laterality: Bilateral;  KYPHOPLASTY LUMBAR ONE, LUMBAR FOUR  . LEFT HEART CATH AND CORONARY ANGIOGRAPHY N/A 10/23/2018   Procedure: LEFT HEART CATH AND CORONARY ANGIOGRAPHY;  Surgeon: Tonny Bollman, MD;  Location: [facility] INVASIVE CV LAB;  Service: Cardiovascular;  Laterality: N/A;  . TUBAL LIGATION       reports that she has never smoked. She has never used smokeless tobacco. She reports that she does not drink alcohol or use drugs.  Allergies  Allergen Reactions  . Doxycycline Hyclate Anaphylaxis    Chest pain  . Shellfish Allergy Anaphylaxis  . Demerol Other (See Comments)    Sedation  .  Sulfa Antibiotics Other (See Comments)    Unknown rxn    Family History  Problem Relation Age of Onset  . Alzheimer's disease Mother   . Heart attack Father   . Heart disease Brother   . Diabetes Son   . Sensorineural hearing loss Sister   . Heart disease Brother   . Heart disease Brother   . Thyroid disease Brother    Prior to Admission medications   Medication Sig Start Date End Date Taking? Authorizing  Provider  acetaminophen (TYLENOL) 500 MG tablet Take 500 mg by mouth daily as needed for mild pain.    Yes [provider]  aspirin EC 81 MG tablet Take 81 mg by mouth daily.    Yes [provider]  Calcium Carb-Cholecalciferol (CALCIUM + VITAMIN D3 PO) Take 1 tablet by mouth 2 (two) times daily.   Yes [provider]  irbesartan (AVAPRO) 150 MG tablet Take 1 tablet (150 mg total) by mouth daily. 10/21/18  Yes Sande Rives E, PA-C  metoprolol tartrate (LOPRESSOR) 25 MG tablet TAKE 1/2 TABLET(12.5 MG) BY MOUTH TWICE DAILY Patient taking differently: Take 12.5 mg by mouth 2 (two) times daily.  11/19/18  Yes Lorretta Harp, MD  Multiple Vitamins-Minerals (PRESERVISION AREDS 2) CAPS Take 1 capsule by mouth 2 (two) times daily.   Yes [provider]  atorvastatin (LIPITOR) 10 MG tablet Take 1 tablet (10 mg total) by mouth daily at 6 PM. Patient not taking: Reported on 12/23/2018 10/24/18   Antony Madura, PA-C    Physical Exam: Vitals:   12/23/18 1051 12/23/18 1716  BP: (!) 165/96 (!) 177/94  Pulse: 80 88  Resp: 18 18  Temp: 98.9 F (37.2 C)   SpO2: 99% 96%    Constitutional: Frail, under nourished. Eyes: PERRL, lids and conjunctivae normal ENMT: Mucous membranes are moist. Posterior pharynx clear of any exudate or lesions. Neck: normal, supple, no masses, no thyromegaly Respiratory: clear to auscultation bilaterally, no wheezing, no crackles. Normal respiratory effort. No accessory muscle use.  Cardiovascular: Regular rate and rhythm, no murmurs / rubs / gallops. No extremity edema. 2+ pedal pulses. No carotid bruits.  Abdomen: Nondistended, soft, no tenderness, no masses palpated. No hepatosplenomegaly. Bowel sounds positive.  Musculoskeletal: no clubbing / cyanosis.  Immobilization in place.  See ED notes for premobilization exam.  RLE significant decreased ROM, no contractures. Normal muscle tone.  Skin: Small areas of ecchymosis on  extremities. Neurologic: CN 2-12 grossly intact. Sensation intact, DTR normal. Strength 5/5 in all 4.  Psychiatric: Normal judgment and insight. Alert and oriented x 3. Normal mood.   Labs on Admission: I have personally reviewed following labs and imaging studies  CBC: Recent Labs  Lab 12/23/18 1622 12/23/18 1631  WBC 7.3  --   NEUTROABS 5.5  --   HGB 11.8* 11.9*  HCT 37.0 35.0*  MCV 96.9  --   PLT 294  --    Basic Metabolic Panel: Recent Labs  Lab 12/23/18 1622 12/23/18 1631  NA 140 136  K 3.8 3.8  CL 101 98  CO2 29  --   GLUCOSE 77 78  BUN 16 16  CREATININE 0.47 0.50  CALCIUM 8.5*  --    GFR: CrCl cannot be calculated (Unknown ideal weight.). Liver Function Tests: Recent Labs  Lab 12/23/18 1622  AST 18  ALT 13  ALKPHOS 167*  BILITOT 1.0  PROT 6.1*  ALBUMIN 3.4*   No results for input(s): LIPASE, AMYLASE in the last  168 hours. No results for input(s): AMMONIA in the last 168 hours. Coagulation Profile: No results for input(s): INR, PROTIME in the last 168 hours. Cardiac Enzymes: No results for input(s): CKTOTAL, CKMB, CKMBINDEX, TROPONINI in the last 168 hours. BNP (last 3 results) No results for input(s): PROBNP in the last 8760 hours. HbA1C: No results for input(s): HGBA1C in the last 72 hours. CBG: No results for input(s): GLUCAP in the last 168 hours. Lipid Profile: No results for input(s): CHOL, HDL, LDLCALC, TRIG, CHOLHDL, LDLDIRECT in the last 72 hours. Thyroid Function Tests: No results for input(s): TSH, T4TOTAL, FREET4, T3FREE, THYROIDAB in the last 72 hours. Anemia Panel: No results for input(s): VITAMINB12, FOLATE, FERRITIN, TIBC, IRON, RETICCTPCT in the last 72 hours. Urine analysis: No results found for: COLORURINE, APPEARANCEUR, LABSPEC, PHURINE, GLUCOSEU, HGBUR, BILIRUBINUR, KETONESUR, PROTEINUR, UROBILINOGEN, NITRITE, LEUKOCYTESUR  Radiological Exams on Admission: CT Knee Right Wo Contrast  Result Date: 12/23/2018 CLINICAL DATA:   Fall off couch today EXAM: CT OF THE right KNEE WITHOUT CONTRAST TECHNIQUE: Multidetector CT imaging of the right knee was performed according to the standard protocol. Multiplanar CT image reconstructions were also generated. COMPARISON:  December 23, 2018 FINDINGS: Bones/Joint/Cartilage There is a thin line of horizontal sclerosis with a possible area of cortical step-off seen at the posterior lateral proximal tibia best seen on series 8, image 22. This could represent area of osseous contusion or nondisplaced fracture. No other fractures are seen. No intra-articular extension. There is diffuse osteopenia. Mild to moderate tricompartmental osteoarthritis is seen with joint space loss and marginal osteophyte formation. Ligaments Suboptimally assessed by CT. Muscles and Tendons The muscles surrounding the knee are normal appearance without evidence of focal atrophy or tear. The patellar and quadriceps tendon are intact. Soft tissues There is a small knee joint effusion. IMPRESSION: Findings which could be suggestive of osseous contusion or nondisplaced fracture of the posterior lateral tibia. Trace knee joint effusion. Mild-to-moderate tricompartmental osteoarthritis. Diffuse osteopenia Electronically Signed   By: Jonna ClarkBindu  Avutu M.D.   On: 12/23/2018 14:17   DG Knee Complete 4 Views Right  Result Date: 12/23/2018 CLINICAL DATA:  Fall, right knee pain. EXAM: RIGHT KNEE - COMPLETE 4+ VIEW COMPARISON:  None. FINDINGS: No evidence of fracture, dislocation, or joint effusion. No evidence of arthropathy or other focal bone abnormality. Soft tissues are unremarkable. Diffuse bony demineralization. IMPRESSION: Osteopenia without signs of fracture. Electronically Signed   By: Donzetta KohutGeoffrey  Wile M.D.   On: 12/23/2018 12:09    EKG: Independently reviewed.   Assessment/Plan Principal Problem:   Closed right tibial fracture Observation/MedSurg. Continue analgesics as needed. In order outpatient orthopedic surgery  follow-up. Consult physical therapy. Consult social services.  Active Problems:   Essential hypertension Continue metoprolol 12.5 mg p.o. daily. Monitor blood pressure and heart rate.    Hyperlipidemia Currently not on any medical therapy.    Mild CAD She denies CP, palpitations or dyspnea. Continue metoprolol and aspirin    DVT prophylaxis: Lovenox SQ Code Status: Full code. Family Communication: Disposition Plan: Observation for pain control and SS evaluation. Consults called: Social services have been contacted. Admission status: Observation/MedSurg.   Bobette Moavid Manuel Yoanna Jurczyk MD [facility]  If 7PM-7AM, please contact night-coverage [email or web address]  12/23/2018, 6:08 PM   This document was prepared using Dragon voice recognition software and may contain some unintended transcription errors.

## 2018-12-23 NOTE — ED Provider Notes (Signed)
[facility] DEPT Provider Note   CSN: [id number] Arrival date & time: 12/23/18  1042     History Chief Complaint  Patient presents with  . Fall  . Knee Pain    right    Julie Durham is a 78 y.o. female with PMH/o Asthma, HTN, HLD who presents for evaluation of right knee pain status post mechanical fall.  She reports about 7 AM this morning, she slipped off the edge of her couch and landed on her right knee.  She denies any head injury, LOC.  She states that she sat there for about 3 hours for she was able to get in contact with anybody.  She states that since then, she is not been able to ambulate or bear weight on her right knee secondary to pain.  At baseline, she ambulates with the assistance of a walker or a cane.  Patient states she is not on any blood thinners.  She denies any vision changes, numbness/weakness of her arms or legs, chest pain, dizziness, difficulty breathing or abdominal pain.  The history is provided by the patient.       Past Medical History:  Diagnosis Date  . Allergy   . Anemia   . Arthritis   . Asthma   . Brain mass    a. 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  . Cataract   . Elevated troponin   . Encephalitis, viral    a. 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 Hyperlipidemia   . Hypertension   . Mild CAD per cath 10/2018   . Mild mitral regurgitation    a. Echo 2011: LVEF >55% w/ grade 1 DD, mild MR, mild TR, elevated RVSP 30-75mmHg  . Mild tricuspid regurgitation   . Palpitations   . Takotsubo cardiomyopathy     Patient Active Problem List   Diagnosis Date Noted  . Protein-calorie malnutrition, severe 12/24/2018  . Acute pain of right knee   . Closed right tibial fracture 12/23/2018  . Mild CAD 10/24/2018  . S/P  kyphoplasty 10/24/2018  . Stress fracture of lumbar vertebra 10/24/2018  . Systolic dysfunction 35/36/1443  . Elevated troponin 10/24/2018  . Chest pain  10/24/2018  . Brain mass 10/24/2018  . Takotsubo syndrome   . Lumbar compression fracture ([place]) 10/22/2018  . Hyperlipidemia 02/28/2017  . Oropharyngeal dysphagia 10/04/2016  . Other bursal cyst, left elbow 10/04/2016  . Pill esophagitis 10/04/2016  . Infected insect bite 06/29/2016  . Retained foreign body in soft tissue 06/29/2016  . Essential hypertension 11/18/2012  . Palpitations 11/18/2012    Past Surgical History:  Procedure Laterality Date  . APPENDECTOMY    . CHOLECYSTECTOMY    . DOPPLER ECHOCARDIOGRAPHY  2011  . EYE SURGERY    . KYPHOPLASTY Bilateral 10/22/2018   Procedure: KYPHOPLASTY LUMBAR ONE, LUMBAR FOUR;  Surgeon: Consuella Lose, MD;  Location: [facility];  Service: Neurosurgery;  Laterality: Bilateral;  KYPHOPLASTY LUMBAR ONE, LUMBAR FOUR  . LEFT HEART CATH AND CORONARY ANGIOGRAPHY N/A 10/23/2018   Procedure: LEFT HEART CATH AND CORONARY ANGIOGRAPHY;  Surgeon: Sherren Mocha, MD;  Location: [facility];  Service: Cardiovascular;  Laterality: N/A;  . TUBAL LIGATION       OB History   No obstetric history on file.     Family History  Problem  Relation Age of Onset  . Alzheimer's disease Mother   . Heart attack Father   . Heart disease Brother   . Diabetes Son   . Sensorineural hearing loss Sister   . Heart disease Brother   . Heart disease Brother   . Thyroid disease Brother     Social History   Tobacco Use  . Smoking status: Never Smoker  . Smokeless tobacco: Never Used  Substance Use Topics  . Alcohol use: Never  . Drug use: Never    Home Medications Prior to Admission medications   Medication Sig Start Date End Date Taking? Authorizing Provider  acetaminophen (TYLENOL) 500 MG tablet Take 500 mg by mouth daily as needed for mild pain.    Yes [provider]  aspirin EC 81  MG tablet Take 81 mg by mouth daily.    Yes [provider]  atorvastatin (LIPITOR) 10 MG tablet Take 1 tablet (10 mg total) by mouth daily at 6 PM. 10/24/18  Yes Furth, Cadence H, PA-C  Calcium Carb-Cholecalciferol (CALCIUM + VITAMIN D3 PO) Take 1 tablet by mouth 2 (two) times daily.   Yes [provider]  irbesartan (AVAPRO) 150 MG tablet Take 1 tablet (150 mg total) by mouth daily. 10/21/18  Yes Marjie Skiff E, PA-C  metoprolol tartrate (LOPRESSOR) 25 MG tablet TAKE 1/2 TABLET(12.5 MG) BY MOUTH TWICE DAILY Patient taking differently: Take 12.5 mg by mouth 2 (two) times daily.  11/19/18  Yes Runell Gess, MD  Multiple Vitamins-Minerals (PRESERVISION AREDS 2) CAPS Take 1 capsule by mouth 2 (two) times daily.   Yes [provider]    Allergies    Doxycycline hyclate, Shellfish allergy, Demerol, and Sulfa antibiotics  Review of Systems   Review of Systems  Constitutional: Negative for fever.  Respiratory: Negative for cough and shortness of breath.   Cardiovascular: Negative for chest pain.  Gastrointestinal: Negative for abdominal pain, nausea and vomiting.  Genitourinary: Negative for dysuria and hematuria.  Musculoskeletal:       Knee pain  Neurological: Negative for weakness, numbness and headaches.  All other systems reviewed and are negative.   Physical Exam Updated Vital Signs BP (!) 155/93 (BP Location: Right Arm)   Pulse (!) 106   Temp 98.1 F (36.7 C)   Resp 18   Ht  (1.473 m)   Wt 31.6 kg   SpO2 96%   BMI 14.56 kg/m   Physical Exam Vitals and nursing note reviewed.  Constitutional:      Appearance: Normal appearance. She is well-developed.  HENT:     Head: Normocephalic and atraumatic.  Eyes:     General: Lids are normal.     Conjunctiva/sclera: Conjunctivae normal.     Pupils: Pupils are equal, round, and reactive to light.  Cardiovascular:     Rate and Rhythm: Normal rate and regular rhythm.     Pulses: Normal  pulses.          Radial pulses are 2+ on the right side and 2+ on the left side.       Dorsalis pedis pulses are 2+ on the right side and 2+ on the left side.     Heart sounds: Normal heart sounds. No murmur. No friction rub. No gallop.   Pulmonary:     Effort: Pulmonary effort is normal.     Breath sounds: Normal breath sounds.     Comments: Lungs clear to auscultation bilaterally.  Symmetric chest rise.  No wheezing, rales, rhonchi.  Abdominal:     Palpations: Abdomen is soft. Abdomen is not rigid.     Tenderness: There is no abdominal tenderness. There is no guarding.  Musculoskeletal:        General: Normal range of motion.     Cervical back: Full passive range of motion without pain.     Comments: Tenderness palpation of the anterior aspect of right knee with overlying soft tissue swelling.  No deformity or crepitus noted.  Flexion/tension intact but with subjective reports of pain.  She can extend against gravity.  No bony tenderness noted right hip, right femur, right tib-fib, and ankle.  No chest palpation of the left lower extremity.  Skin:    General: Skin is warm and dry.     Capillary Refill: Capillary refill takes less than 2 seconds.  Neurological:     Mental Status: She is alert and oriented to person, place, and time.  Psychiatric:        Speech: Speech normal.     ED Results / Procedures / Treatments   Labs (all labs ordered are listed, but only abnormal results are displayed) Labs Reviewed  CBC WITH DIFFERENTIAL/PLATELET - Abnormal; Notable for the following components:      Result Value   RBC 3.82 (*)    Hemoglobin 11.8 (*)    All other components within normal limits  COMPREHENSIVE METABOLIC PANEL - Abnormal; Notable for the following components:   Calcium 8.5 (*)    Total Protein 6.1 (*)    Albumin 3.4 (*)    Alkaline Phosphatase 167 (*)    All other components within normal limits  CBC - Abnormal; Notable for the following components:   RBC 3.49 (*)     Hemoglobin 10.9 (*)    HCT 34.1 (*)    All other components within normal limits  COMPREHENSIVE METABOLIC PANEL - Abnormal; Notable for the following components:   Calcium 8.4 (*)    Total Protein 5.3 (*)    Albumin 2.8 (*)    AST 14 (*)    Alkaline Phosphatase 142 (*)    GFR calc non Af Amer 55 (*)    All other components within normal limits  I-STAT CHEM 8, ED - Abnormal; Notable for the following components:   Calcium, Ion 1.13 (*)    Hemoglobin 11.9 (*)    HCT 35.0 (*)    All other components within normal limits  SARS CORONAVIRUS 2 (TAT 6-24 HRS)  CK  URINALYSIS, ROUTINE W REFLEX MICROSCOPIC  CBC  BASIC METABOLIC PANEL    EKG None  Radiology CT Knee Right Wo Contrast  Result Date: 12/23/2018 CLINICAL DATA:  Fall off couch today EXAM: CT OF THE right KNEE WITHOUT CONTRAST TECHNIQUE: Multidetector CT imaging of the right knee was performed according to the standard protocol. Multiplanar CT image reconstructions were also generated. COMPARISON:  December 23, 2018 FINDINGS: Bones/Joint/Cartilage There is a thin line of horizontal sclerosis with a possible area of cortical step-off seen at the posterior lateral proximal tibia best seen on series 8, image 22. This could represent area of osseous contusion or nondisplaced fracture. No other fractures are seen. No intra-articular extension. There is diffuse osteopenia. Mild to moderate tricompartmental osteoarthritis is seen with joint space loss and marginal osteophyte formation. Ligaments Suboptimally assessed by CT. Muscles and Tendons The muscles surrounding the knee are normal appearance without evidence of focal atrophy or tear. The patellar and quadriceps tendon are intact. Soft tissues There is a small knee joint  effusion. IMPRESSION: Findings which could be suggestive of osseous contusion or nondisplaced fracture of the posterior lateral tibia. Trace knee joint effusion. Mild-to-moderate tricompartmental osteoarthritis. Diffuse  osteopenia Electronically Signed   By: Jonna ClarkBindu  Avutu M.D.   On: 12/23/2018 14:17   DG Knee Complete 4 Views Right  Result Date: 12/23/2018 CLINICAL DATA:  Fall, right knee pain. EXAM: RIGHT KNEE - COMPLETE 4+ VIEW COMPARISON:  None. FINDINGS: No evidence of fracture, dislocation, or joint effusion. No evidence of arthropathy or other focal bone abnormality. Soft tissues are unremarkable. Diffuse bony demineralization. IMPRESSION: Osteopenia without signs of fracture. Electronically Signed   By: Donzetta KohutGeoffrey  Wile M.D.   On: 12/23/2018 12:09    Procedures Procedures (including critical care time)  Medications Ordered in ED Medications  oxyCODONE (OXYCONTIN) 12 hr tablet 10 mg (10 mg Oral Given 12/24/18 2049)  enoxaparin (LOVENOX) injection 15 mg (15 mg Subcutaneous Given 12/24/18 2049)  prochlorperazine (COMPAZINE) injection 5 mg (5 mg Intravenous Given 12/24/18 0902)  fentaNYL (SUBLIMAZE) injection 12.5 mcg (has no administration in time range)  irbesartan (AVAPRO) tablet 150 mg (150 mg Oral Given 12/24/18 0902)  aspirin EC tablet 81 mg (81 mg Oral Given 12/24/18 0902)  metoprolol tartrate (LOPRESSOR) tablet 12.5 mg (12.5 mg Oral Given 12/24/18 2049)  metoprolol tartrate (LOPRESSOR) injection 5 mg (5 mg Intravenous Not Given 12/23/18 1900)  atorvastatin (LIPITOR) tablet 10 mg (10 mg Oral Given 12/24/18 1853)  acetaminophen (TYLENOL) tablet 500 mg (500 mg Oral Given 12/24/18 2049)  multivitamin with minerals tablet 1 tablet (1 tablet Oral Given 12/24/18 1255)  feeding supplement (ENSURE ENLIVE) (ENSURE ENLIVE) liquid 237 mL (237 mLs Oral Not Given 12/24/18 2121)  feeding supplement (BOOST / RESOURCE BREEZE) liquid 1 Container (1 Container Oral Given 12/24/18 1640)  simethicone (MYLICON) chewable tablet 80 mg (80 mg Oral Given 12/24/18 2050)  bismuth subsalicylate (PEPTO BISMOL) 262 MG/15ML suspension 30 mL (has no administration in time range)  acetaminophen (TYLENOL) tablet 1,000 mg (1,000 mg  Oral Given 12/23/18 1305)    ED Course  I have reviewed the triage vital signs and the nursing notes.  Pertinent labs & imaging results that were available during my care of the patient were reviewed by me and considered in my medical decision making (see chart for details).    MDM Rules/Calculators/A&P                      78 year old female who presents for evaluation of right knee pain status post mechanical fall.  No head injury, LOC. Patient is afebrile, non-toxic appearing, sitting comfortably on examination table. Vital signs reviewed and stable. Patient is neurovascularly intact.  On exam, she has tenderness palpation of the right knee.  Concern for fracture versus location.  Plan for x-rays.  X-rays reviewed.  No acute bony abnormality.  Patient attempted to ambulate to the bathroom but is unable to put any weight on her right lower extremity even with assistance.  Given that she is continued have pain, will plan to CT scanner for further evaluation.  CT scan shows possible contusion versus posterior lateral tibial fracture.  Discussed patient with Dr. Despina HickAlusio (ortho) who recommended knee immobilizer and weightbearing as tolerated.  Patient appropriate for outpatient follow-up.  Patient still not able to ambulate to the bathroom.  She was unable to put weight on her right leg.  I discussed with patient's son, Julie Durham.  He stated that patient lives alone.  His dad is the one who found her.  They had been talking about getting her into assisted living because they feel like she is unsafe to live alone.  Dad had actually gotten in contact with [facility] who would accept patient after admission to the hospital.   Given that patient has difficulty walking and has pain, will plan for admission for observation with plans for placement.  Discussed patient with Dr. Robb Matar (hospitalist). Will accept patient for admission.   Discussed patient with Christiane Ha Merchant navy officer).  He will get in  contact with family to help facilitate the process of getting patient accepted to a rehab facility.   Portions of this note were generated with Scientist, clinical (histocompatibility and immunogenetics). Dictation errors may occur despite best attempts at proofreading.   Final Clinical Impression(s) / ED Diagnoses Final diagnoses:  Acute pain of right knee  Closed nondisplaced fracture of lateral condyle of right tibia, initial encounter    Rx / DC Orders ED Discharge Orders    None       Rosana Hoes 12/24/18 2258    Bethann Berkshire, MD 12/25/18 [phone]

## 2018-12-23 NOTE — ED Notes (Signed)
Pt assisted to restroom in triage to void; pt verbalizes "lots of pain" when weight is beared to right leg [knee pain].

## 2018-12-23 NOTE — Progress Notes (Signed)
CSW received a call from [employer] stating pt is to be admitted but prior to being BIB with plans to be D/C'd to SNF Rehab, and that per pt's ex-husband [place] has been working with family for pt be admitted to [place].  Per [facility], pt's son is getting the necessary info from the pt's dad to forward to the ED (specifically).  Per [employer], pt is being admitted with a plan towards being D/C'd to [place].  Per [employer], PA will be at Margit Banda and [facility] [place] [phone].  CSW will continue to follow for D/C needs.  Alphonse Guild. Kaylei Frink, LCSW, LCAS, CSI Transitions of Care Clinical Social Worker Care Coordination Department Ph: [phone]

## 2018-12-23 NOTE — TOC Initial Note (Signed)
Transition of Care [facility]) - Initial/Assessment Note    Patient Details  Name: Julie Durham MRN: [id number] Date of Birth: 1940/02/02  Transition of Care [facility]) CM/SW Contact:    Mercy Riding, LCSW Phone Number: 12/23/2018, 10:03 PM  Clinical Narrative:    CSW spoke to the pt's son who is bedside w/ pt and POA.  Per pt/pt's family pt's relative used to do work with and prefers [address] and as such pt/pt's family would like to seek admission to [place] at D/C from [employer].  CSW spoke with pt/pt's son and confirmed pt's plan to be discharged to [facility] to for rehab at discharge.  CSW provided active listening and validated pt's son's concerns.   CSW Dept was provide verbal permission from pt/pt's son to complete FL-2 and send referrals out to all SNF facilities in the [place] area, via the hub per pt's request, so that pt has more choices should [address] be full. Pt has been living independently prior to being admitted to [facility], per pt's son.  Pt's son provided verbal permission for the CSW to speak to [employer] w/ [facility] and gain collateral.  Pt's son voiced understanding pt may be ready for D/C and that [facility] may not have an available bed and that pt may have to D/C to another SNF with available beds with the option to either stay or transfer to [facility] later is an bed becomes available and pt's son voiced understanding.               Expected Discharge Plan: Skilled Nursing Facility Barriers to Discharge: Continued Medical Work up   Patient Goals and CMS Choice Patient states their goals for this hospitalization and ongoing recovery are:: Per family to not return home until safe to live alone.      Expected Discharge Plan and Services Expected Discharge Plan: Skilled Nursing Facility       Living arrangements for the past 2 months: Single Family Home                                      Prior  Living Arrangements/Services Living arrangements for the past 2 months: Single Family Home Lives with:: Self   Do you feel safe going back to the place where you live?: No      Need for Family Participation in Patient Care: Yes (Comment) Care giver support system in place?: No (comment)      Activities of Daily Living      Permission Sought/Granted Permission sought to share information with : Oceanographer granted to share information with : Yes, Verbal Permission Granted              Emotional Assessment       Orientation: : Oriented to Self, Oriented to Place, Oriented to  Time, Oriented to Situation      Admission diagnosis:  Closed right tibial fracture [S82.201A] Closed nondisplaced fracture of lateral condyle of right tibia, initial encounter [S82.124A] Acute pain of right knee [M25.561] Patient Active Problem List   Diagnosis Date Noted  . Closed right tibial fracture 12/23/2018  . Mild CAD 10/24/2018  . S/P kyphoplasty 10/24/2018  . Stress fracture of lumbar vertebra 10/24/2018  . Systolic dysfunction 10/24/2018  . Elevated troponin 10/24/2018  . Chest pain  10/24/2018  . Brain mass 10/24/2018  .  Takotsubo syndrome   . Lumbar compression fracture ([place]) 10/22/2018  . Hyperlipidemia 02/28/2017  . Oropharyngeal dysphagia 10/04/2016  . Other bursal cyst, left elbow 10/04/2016  . Pill esophagitis 10/04/2016  . Infected insect bite 06/29/2016  . Retained foreign body in soft tissue 06/29/2016  . Essential hypertension 11/18/2012  . Palpitations 11/18/2012   PCP:  Patient, No Pcp Per Pharmacy:   [id number] [address], [place] [address] Phone: [phone] Fax: [phone]     Social Determinants of Health ([place]) Interventions    Readmission Risk Interventions No flowsheet data found.

## 2018-12-23 NOTE — Progress Notes (Signed)
Per [place] MUST, pt's PASSR was given today:  [id number] with no end date.  CSW will continue to follow for D/C needs.  Alphonse Guild. Jayln Branscom, LCSW, LCAS, CSI Transitions of Care Clinical Social Worker Care Coordination Department Ph: [phone]

## 2018-12-23 NOTE — NC FL2 (Signed)
[place] LEVEL OF CARE SCREENING TOOL     IDENTIFICATION  Patient Name: Julie Durham Birthdate: [birth year] Sex: female Admission Date (Current Location): 12/23/2018  [facility] and [place] Number:  Herbalist and Address:  [facility],  [address], [place]      Provider Number: [id number]  Attending Physician Name and Address:  Reubin Milan, MD  Relative Name and Phone Number:       Current Level of Care: Hospital Recommended Level of Care: [place] Prior Approval Number:    Date Approved/Denied:   PASRR Number: [id number]  Discharge Plan: SNF    Current Diagnoses: Patient Active Problem List   Diagnosis Date Noted  . Closed right tibial fracture 12/23/2018  . Mild CAD 10/24/2018  . S/P kyphoplasty 10/24/2018  . Stress fracture of lumbar vertebra 10/24/2018  . Systolic dysfunction 95/28/4132  . Elevated troponin 10/24/2018  . Chest pain  10/24/2018  . Brain mass 10/24/2018  . Takotsubo syndrome   . Lumbar compression fracture ([place]) 10/22/2018  . Hyperlipidemia 02/28/2017  . Oropharyngeal dysphagia 10/04/2016  . Other bursal cyst, left elbow 10/04/2016  . Pill esophagitis 10/04/2016  . Infected insect bite 06/29/2016  . Retained foreign body in soft tissue 06/29/2016  . Essential hypertension 11/18/2012  . Palpitations 11/18/2012    Orientation RESPIRATION BLADDER Height & Weight     Self, Time, Situation, Place  Normal Incontinent Weight:   Height:     BEHAVIORAL SYMPTOMS/MOOD NEUROLOGICAL BOWEL NUTRITION STATUS      Incontinent Diet  AMBULATORY STATUS COMMUNICATION OF NEEDS Skin   Limited Assist Verbally Normal                       Personal Care Assistance Level of Assistance  Bathing, Dressing Bathing Assistance: Limited assistance   Dressing Assistance: Limited assistance     Functional Limitations Info             SPECIAL CARE FACTORS FREQUENCY  PT (By  licensed PT), OT (By licensed OT)     PT Frequency: 5 OT Frequency: 5            Contractures Contractures Info: Not present    Additional Factors Info  Code Status, Allergies Code Status Info: FULL Allergies Info: Doxycycline Hyclate, Shellfish Allergy, Demerol, Sulfa Antibiotics           Current Medications (12/23/2018):  This is the current hospital active medication list Current Facility-Administered Medications  Medication Dose Route Frequency Provider Last Rate Last Admin  . acetaminophen (TYLENOL) tablet 650 mg  650 mg Oral Q6H PRN Reubin Milan, MD       Or  . acetaminophen (TYLENOL) suppository 650 mg  650 mg Rectal Q6H PRN Reubin Milan, MD      . aspirin EC tablet 81 mg  81 mg Oral Daily Reubin Milan, MD      . enoxaparin (LOVENOX) injection 15 mg  15 mg Subcutaneous Q24H Reubin Milan, MD      . fentaNYL (SUBLIMAZE) injection 12.5 mcg  12.5 mcg Intravenous Q2H PRN Reubin Milan, MD      . irbesartan Levy Sjogren) tablet 150 mg  150 mg Oral Daily Reubin Milan, MD      . metoprolol tartrate (LOPRESSOR) injection 5 mg  5 mg Intravenous Once Reubin Milan, MD      . metoprolol tartrate (LOPRESSOR) tablet 12.5 mg  12.5 mg  Oral BID Bobette Mo, MD      . oxyCODONE (OXYCONTIN) 12 hr tablet 10 mg  10 mg Oral Q12H Bobette Mo, MD   10 mg at 12/23/18 1537  . prochlorperazine (COMPAZINE) injection 5 mg  5 mg Intravenous Q6H PRN Bobette Mo, MD         Discharge Medications: Please see discharge summary for a list of discharge medications.  Relevant Imaging Results:  Relevant Lab Results:   Additional Information [id number]  Dorothe Pea Deserie Dirks, LCSW

## 2018-12-23 NOTE — Progress Notes (Signed)
CSW spoke to Dr. Olevia Bowens who was agreeable to place a PT consult and order a rapid COVID test for placement/D/C purposes.  CSW will continue to follow for D/C needs.  Julie Durham. Treshawn Allen, LCSW, LCAS, CSI Transitions of Care Clinical Social Worker Care Coordination Department Ph: [phone]

## 2018-12-24 ENCOUNTER — Other Ambulatory Visit: Payer: Self-pay

## 2018-12-24 DIAGNOSIS — J449 Chronic obstructive pulmonary disease, unspecified: Secondary | ICD-10-CM | POA: Diagnosis not present

## 2018-12-24 DIAGNOSIS — W19XXXA Unspecified fall, initial encounter: Secondary | ICD-10-CM | POA: Diagnosis not present

## 2018-12-24 DIAGNOSIS — I10 Essential (primary) hypertension: Secondary | ICD-10-CM | POA: Diagnosis not present

## 2018-12-24 DIAGNOSIS — Z681 Body mass index (BMI) 19 or less, adult: Secondary | ICD-10-CM | POA: Diagnosis not present

## 2018-12-24 DIAGNOSIS — M25561 Pain in right knee: Secondary | ICD-10-CM

## 2018-12-24 DIAGNOSIS — Z8249 Family history of ischemic heart disease and other diseases of the circulatory system: Secondary | ICD-10-CM | POA: Diagnosis not present

## 2018-12-24 DIAGNOSIS — W07XXXA Fall from chair, initial encounter: Secondary | ICD-10-CM | POA: Diagnosis present

## 2018-12-24 DIAGNOSIS — E43 Unspecified severe protein-calorie malnutrition: Secondary | ICD-10-CM | POA: Diagnosis not present

## 2018-12-24 DIAGNOSIS — I2510 Atherosclerotic heart disease of native coronary artery without angina pectoris: Secondary | ICD-10-CM | POA: Diagnosis not present

## 2018-12-24 DIAGNOSIS — Z743 Need for continuous supervision: Secondary | ICD-10-CM | POA: Diagnosis not present

## 2018-12-24 DIAGNOSIS — E785 Hyperlipidemia, unspecified: Secondary | ICD-10-CM | POA: Diagnosis not present

## 2018-12-24 DIAGNOSIS — Z20828 Contact with and (suspected) exposure to other viral communicable diseases: Secondary | ICD-10-CM | POA: Diagnosis present

## 2018-12-24 DIAGNOSIS — Z888 Allergy status to other drugs, medicaments and biological substances status: Secondary | ICD-10-CM | POA: Diagnosis not present

## 2018-12-24 DIAGNOSIS — S82201D Unspecified fracture of shaft of right tibia, subsequent encounter for closed fracture with routine healing: Secondary | ICD-10-CM | POA: Diagnosis not present

## 2018-12-24 DIAGNOSIS — S82101A Unspecified fracture of upper end of right tibia, initial encounter for closed fracture: Secondary | ICD-10-CM | POA: Diagnosis not present

## 2018-12-24 DIAGNOSIS — J45909 Unspecified asthma, uncomplicated: Secondary | ICD-10-CM | POA: Diagnosis present

## 2018-12-24 DIAGNOSIS — S82124A Nondisplaced fracture of lateral condyle of right tibia, initial encounter for closed fracture: Secondary | ICD-10-CM | POA: Diagnosis not present

## 2018-12-24 DIAGNOSIS — S82201A Unspecified fracture of shaft of right tibia, initial encounter for closed fracture: Secondary | ICD-10-CM | POA: Diagnosis present

## 2018-12-24 DIAGNOSIS — Z885 Allergy status to narcotic agent status: Secondary | ICD-10-CM | POA: Diagnosis not present

## 2018-12-24 DIAGNOSIS — M810 Age-related osteoporosis without current pathological fracture: Secondary | ICD-10-CM | POA: Diagnosis not present

## 2018-12-24 DIAGNOSIS — Z91013 Allergy to seafood: Secondary | ICD-10-CM | POA: Diagnosis not present

## 2018-12-24 DIAGNOSIS — M6281 Muscle weakness (generalized): Secondary | ICD-10-CM | POA: Diagnosis not present

## 2018-12-24 DIAGNOSIS — H25013 Cortical age-related cataract, bilateral: Secondary | ICD-10-CM | POA: Diagnosis not present

## 2018-12-24 DIAGNOSIS — Z79899 Other long term (current) drug therapy: Secondary | ICD-10-CM | POA: Diagnosis not present

## 2018-12-24 DIAGNOSIS — Z882 Allergy status to sulfonamides status: Secondary | ICD-10-CM | POA: Diagnosis not present

## 2018-12-24 DIAGNOSIS — Z741 Need for assistance with personal care: Secondary | ICD-10-CM | POA: Diagnosis not present

## 2018-12-24 DIAGNOSIS — Z7982 Long term (current) use of aspirin: Secondary | ICD-10-CM | POA: Diagnosis not present

## 2018-12-24 DIAGNOSIS — R2689 Other abnormalities of gait and mobility: Secondary | ICD-10-CM | POA: Diagnosis not present

## 2018-12-24 DIAGNOSIS — Z87892 Personal history of anaphylaxis: Secondary | ICD-10-CM | POA: Diagnosis not present

## 2018-12-24 DIAGNOSIS — E7800 Pure hypercholesterolemia, unspecified: Secondary | ICD-10-CM | POA: Diagnosis not present

## 2018-12-24 DIAGNOSIS — Y92008 Other place in unspecified non-institutional (private) residence as the place of occurrence of the external cause: Secondary | ICD-10-CM | POA: Diagnosis not present

## 2018-12-24 DIAGNOSIS — R531 Weakness: Secondary | ICD-10-CM | POA: Diagnosis not present

## 2018-12-24 DIAGNOSIS — D649 Anemia, unspecified: Secondary | ICD-10-CM | POA: Diagnosis not present

## 2018-12-24 DIAGNOSIS — M2550 Pain in unspecified joint: Secondary | ICD-10-CM | POA: Diagnosis not present

## 2018-12-24 DIAGNOSIS — Z7401 Bed confinement status: Secondary | ICD-10-CM | POA: Diagnosis not present

## 2018-12-24 LAB — CBC
HCT: 34.1 % — ABNORMAL LOW (ref 36.0–46.0)
Hemoglobin: 10.9 g/dL — ABNORMAL LOW (ref 12.0–15.0)
MCH: 31.2 pg (ref 26.0–34.0)
MCHC: 32 g/dL (ref 30.0–36.0)
MCV: 97.7 fL (ref 80.0–100.0)
Platelets: 276 10*3/uL (ref 150–400)
RBC: 3.49 MIL/uL — ABNORMAL LOW (ref 3.87–5.11)
RDW: 13.1 % (ref 11.5–15.5)
WBC: 4.9 10*3/uL (ref 4.0–10.5)
nRBC: 0 % (ref 0.0–0.2)

## 2018-12-24 LAB — COMPREHENSIVE METABOLIC PANEL
ALT: 12 U/L (ref 0–44)
AST: 14 U/L — ABNORMAL LOW (ref 15–41)
Albumin: 2.8 g/dL — ABNORMAL LOW (ref 3.5–5.0)
Alkaline Phosphatase: 142 U/L — ABNORMAL HIGH (ref 38–126)
Anion gap: 8 (ref 5–15)
BUN: 22 mg/dL (ref 8–23)
CO2: 29 mmol/L (ref 22–32)
Calcium: 8.4 mg/dL — ABNORMAL LOW (ref 8.9–10.3)
Chloride: 99 mmol/L (ref 98–111)
Creatinine, Ser: 0.98 mg/dL (ref 0.44–1.00)
GFR calc Af Amer: >60 mL/min (ref >60)
GFR calc non Af Amer: 55 mL/min — ABNORMAL LOW (ref >60)
Glucose, Bld: 87 mg/dL (ref 70–99)
Potassium: 4.1 mmol/L (ref 3.5–5.1)
Sodium: 136 mmol/L (ref 135–145)
Total Bilirubin: 0.5 mg/dL (ref 0.3–1.2)
Total Protein: 5.3 g/dL — ABNORMAL LOW (ref 6.5–8.1)

## 2018-12-24 MED ORDER — SIMETHICONE 80 MG PO CHEW
80.0000 mg | CHEWABLE_TABLET | Freq: Four times a day (QID) | ORAL | Status: DC
  Administered 2018-12-24 – 2018-12-25 (×4): 80 mg via ORAL
  Filled 2018-12-24 (×4): qty 1

## 2018-12-24 MED ORDER — ENSURE ENLIVE PO LIQD
237.0000 mL | Freq: Two times a day (BID) | ORAL | Status: DC
  Administered 2018-12-24: 237 mL via ORAL

## 2018-12-24 MED ORDER — ACETAMINOPHEN 500 MG PO TABS
500.0000 mg | ORAL_TABLET | Freq: Three times a day (TID) | ORAL | Status: DC
  Administered 2018-12-24 – 2018-12-25 (×4): 500 mg via ORAL
  Filled 2018-12-24 (×4): qty 1

## 2018-12-24 MED ORDER — ATORVASTATIN CALCIUM 10 MG PO TABS
10.0000 mg | ORAL_TABLET | Freq: Every day | ORAL | Status: DC
  Administered 2018-12-24: 10 mg via ORAL
  Filled 2018-12-24: qty 1

## 2018-12-24 MED ORDER — BOOST / RESOURCE BREEZE PO LIQD CUSTOM
1.0000 | Freq: Two times a day (BID) | ORAL | Status: DC
  Administered 2018-12-24 – 2018-12-25 (×3): 1 via ORAL

## 2018-12-24 MED ORDER — BISMUTH SUBSALICYLATE 262 MG/15ML PO SUSP
30.0000 mL | ORAL | Status: DC | PRN
  Filled 2018-12-24: qty 236

## 2018-12-24 MED ORDER — ADULT MULTIVITAMIN W/MINERALS CH
1.0000 | ORAL_TABLET | Freq: Every day | ORAL | Status: DC
  Administered 2018-12-24 – 2018-12-25 (×2): 1 via ORAL
  Filled 2018-12-24 (×2): qty 1

## 2018-12-24 MED ORDER — ACETAMINOPHEN 500 MG PO TABS: 500.0000 mg | ORAL_TABLET | Freq: Every day | ORAL | Status: DC | PRN

## 2018-12-24 NOTE — Evaluation (Signed)
Physical Therapy Evaluation Patient Details Name: Julie Durham MRN: [id number] DOB: [birth year] Today's Date: 12/24/2018   History of Present Illness  77 yo female admitted to ED on 12/14 for fall off couch resulting in closed R tibial fracture, being managed conservatively. PMH includes anemia, brain mass, encephalitis, takotsubo cardiomyopathy, HTN, HLD, kyphoplasty L1 and L4 10/2018.  Clinical Impression   Pt presents with moderate RLE pain, generalized weakness, difficulty performing mobility tasks, inability to ambulate this session due to pain and questionable RLE WB status, and decreased activity tolerance due to RLE pain. Pt to benefit from acute PT to address deficits. Pt required max assist for bed mobility and transfer out of bed, pt very fearful of falling once sitting EOB and standing. PT recommending SNF level of care post-acutely, pt in agreement. PT to progress mobility as tolerated, and will continue to follow acutely.      Follow Up Recommendations SNF;Supervision/Assistance - 24 hour    Equipment Recommendations  Other (comment)(defer to next venue)    Recommendations for Other Services       Precautions / Restrictions Precautions Precautions: Fall Required Braces or Orthoses: Knee Immobilizer - Right Knee Immobilizer - Right: On at all times Restrictions Weight Bearing Restrictions: Yes Other Position/Activity Restrictions: WBAT per orders, PT encouraged TDWB until orders clarified      Mobility  Bed Mobility Overal bed mobility: Needs Assistance Bed Mobility: Supine to Sit;Sit to Supine     Supine to sit: Max assist;HOB elevated Sit to supine: Max assist;HOB elevated   General bed mobility comments: Max assist for supine<>sit for LE lifting and translation to/from EOB, trunk elevation/lowering, and scooting up in bed with use of bed pad.  Transfers Overall transfer level: Needs assistance Equipment used: Rolling walker (2 wheeled) Transfers: Sit  to/from Stand Sit to Stand: Max assist;From elevated surface         General transfer comment: Max assist for power up, steadying. Pt fearful of falling upon standing with posterior leaning, pt with minimal WB through RLE.  Ambulation/Gait                Stairs            Wheelchair Mobility    Modified Rankin (Stroke Patients Only)       Balance Overall balance assessment: Needs assistance;History of Falls Sitting-balance support: Bilateral upper extremity supported;Feet supported Sitting balance-Leahy Scale: Poor Sitting balance - Comments: posterior leaning with fatigue, with LE movement Postural control: Posterior lean Standing balance support: Bilateral upper extremity supported Standing balance-Leahy Scale: Poor Standing balance comment: reliant on external support                             Pertinent Vitals/Pain Pain Assessment: 0-10 Pain Score: 5  Pain Location: R knee Pain Descriptors / Indicators: Sore;Discomfort Pain Intervention(s): Limited activity within patient's tolerance;Monitored during session;Premedicated before session;Repositioned    Home Living Family/patient expects to be discharged to:: Skilled nursing facility Living Arrangements: Alone                    Prior Function Level of Independence: Independent with assistive device(s)         Comments: pt states she was living alone PTA, with history of "some falls, none major". Pt states she uses 2 canes, one in each hand, to ambulate at home. Pt states her family lives in [place], but cannot stay with her and they have been  trying to get her placed in [place].     Hand Dominance   Dominant Hand: Right    Extremity/Trunk Assessment   Upper Extremity Assessment Upper Extremity Assessment: Generalized weakness    Lower Extremity Assessment Lower Extremity Assessment: Generalized weakness    Cervical / Trunk Assessment Cervical / Trunk Assessment:  Kyphotic(history of kyphoplasty L1, L4)  Communication   Communication: HOH  Cognition Arousal/Alertness: Awake/alert Behavior During Therapy: WFL for tasks assessed/performed Overall Cognitive Status: Within Functional Limits for tasks assessed                                        General Comments      Exercises     Assessment/Plan    PT Assessment Patient needs continued PT services  PT Problem List Decreased mobility;Decreased strength;Decreased range of motion;Decreased activity tolerance;Decreased balance;Decreased knowledge of use of DME;Pain       PT Treatment Interventions Therapeutic activities;DME instruction;Gait training;Therapeutic exercise;Patient/family education;Balance training;Functional mobility training    PT Goals (Current goals can be found in the Care Plan section)  Acute Rehab PT Goals Patient Stated Goal: decrease RLE pain PT Goal Formulation: With patient Time For Goal Achievement: 01/07/19 Potential to Achieve Goals: Good    Frequency Min 2X/week   Barriers to discharge Decreased caregiver support      Co-evaluation               AM-PAC PT "6 Clicks" Mobility  Outcome Measure Help needed turning from your back to your side while in a flat bed without using bedrails?: A Lot Help needed moving from lying on your back to sitting on the side of a flat bed without using bedrails?: Total Help needed moving to and from a bed to a chair (including a wheelchair)?: Total Help needed standing up from a chair using your arms ([place].g., wheelchair or bedside chair)?: Total Help needed to walk in hospital room?: Total Help needed climbing 3-5 steps with a railing? : Total 6 Click Score: 7    End of Session Equipment Utilized During Treatment: Gait belt;Right knee immobilizer Activity Tolerance: Patient limited by pain;Patient limited by fatigue Patient left: in bed;with call bell/phone within reach(unable to set bed alarm due to low  pt weight, pt verbalizes she will not get up without assist) Nurse Communication: Mobility status PT Visit Diagnosis: Other abnormalities of gait and mobility (R26.89);Difficulty in walking, not elsewhere classified (R26.2)    Time: 2694-8546 PT Time Calculation (min) (ACUTE ONLY): 19 min   Charges:   PT Evaluation $PT Eval Low Complexity: 1 Low          Julie Durham, PT Acute Rehabilitation Services Pager [phone]  Office [phone]  Julie Durham 12/24/2018, 11:14 AM

## 2018-12-24 NOTE — Progress Notes (Signed)
PROGRESS NOTE    Julie Durham  RUE:[id number] DOB: 05-21-1940 [name]: 12/23/2018 PCP: Patient, No Pcp Per    Brief Narrative:  HPI per Dr. Gaynelle Arabian is a 78 y.o. female with medical history significant of seasonal allergies, normocytic anemia, osteoarthritis, asthma, history of brain mass (see below for details), cataracts, history of viral encephalitis, hyperlipidemia, hypertension, mild mitral regurgitation, mild tricuspid regurgitation, Takotsubo cardiomyopathy, palpitations, mild CAD who is brought to the emergency department after sleeping off her couch and falling to the floor at home injuring her right knee around 0700.   She stated that she was on the floor for about 3 hours before she was able to contact anybody.  She stated that this was an accident and denies chest pain, dyspnea, palpitations, dizziness, diaphoresis, nausea, emesis, focal weakness or numbness.  She also denies fever, chills, headache, abdominal pain, constipation, melena or hematochezia.  She occasionally gets loose stools.  She denies dysuria, frequency or hematuria.  No polyuria, polydipsia, polyphagia or blurred vision.  ED Course: Her CBC showed a white count of 11.8 g/dL, but was otherwise normal.  CMP shows total protein of 6.1 albumin of 3.4 g/dL.  Her alkaline phosphatase was 767 units/L.  All other values are within normal range.  Imaging on CT shows osseous contusion versus nondisplaced fracture of the posterior lateral tibia with a trace joint effusion.  There is mild to moderate tricompartmental osteoarthritis.  Diffuse osteopenia.  Please see images and full radiology report for further details.   Assessment & Plan:   Principal Problem:   Closed right tibial fracture Active Problems:   Essential hypertension   Hyperlipidemia   Mild CAD   Protein-calorie malnutrition, severe   Acute pain of right knee  1 closed right tibia fracture Patient had presented with a fall and work-up  including CT of the knee concerning for closed right tibial fracture.  Patient with significant pain and unable to bear weight.  ED physician discussed with orthopedics, Dr.Aluisio who recommended knee immobilizer with outpatient follow-up and pain management.  Patient seen by physical therapy and patient required maximal assistance and not safe for discharge home.  Patient at increased risk for falling again in the setting of her acute fracture.  Social work consulted for SNF placement.  Continue pain management, supportive care.  Will need outpatient follow-up with orthopedics.  2.  Hypertension Continue current regimen of Avapro, Lopressor.  3.  Hyperlipidemia Continue statin.  4.  Mild coronary artery disease Stable.  Continue Avapro, Lopressor, statin, aspirin.  5.  Severe protein calorie malnutrition Nutritional supplementation.   DVT prophylaxis: Lovenox Code Status: Full Family Communication: Updated son at bedside. Disposition Plan: SNF when bed available.   Consultants:   None  Procedures:  CT right knee 12/23/2018  Plain films of the right knee 12/23/2018  Antimicrobials:  None   Subjective: Patient sitting up in bed states she is losing her voice.  Son at bedside.  Patient denies any chest pain or shortness of breath.  Patient states pain medication helping somewhat with right lower extremity pain.  Objective: Vitals:   12/23/18 1838 12/23/18 2037 12/24/18 0449 12/24/18 1326  BP: (!) 182/107 127/81 (!) 162/86 (!) 166/90  Pulse: 99 (!) 110 71 78  Resp: 16  18 16   Temp: 98.2 F (36.8 C) 98.5 F (36.9 C) 98.1 F (36.7 C) 98.4 F (36.9 C)  TempSrc: Oral Oral  Oral  SpO2: 96% 96% 97% 94%  Weight:  31.6 kg  Height:  4\' 10"  (1.473 m)      Intake/Output Summary (Last 24 hours) at 12/24/2018 1749 Last data filed at 12/24/2018 1326 Gross per 24 hour  Intake 360 ml  Output --  Net 360 ml   Filed Weights   12/23/18 2037  Weight: 31.6 kg     Examination:  General exam: Frail.  Cachectic.  Respiratory system: Clear to auscultation. Respiratory effort normal. Cardiovascular system: S1 & S2 heard, RRR. No JVD, murmurs, rubs, gallops or clicks. No pedal edema. Gastrointestinal system: Abdomen is nondistended, soft and nontender. No organomegaly or masses felt. Normal bowel sounds heard. Central nervous system: Alert and oriented. No focal neurological deficits. Extremities: Right lower extremity in knee immobilizer.  Skin: No rashes, lesions or ulcers Psychiatry: Judgement and insight appear normal. Mood & affect appropriate.     Data Reviewed: I have personally reviewed following labs and imaging studies  CBC: Recent Labs  Lab 12/23/18 1622 12/23/18 1631 12/24/18 0243  WBC 7.3  --  4.9  NEUTROABS 5.5  --   --   HGB 11.8* 11.9* 10.9*  HCT 37.0 35.0* 34.1*  MCV 96.9  --  97.7  PLT 294  --  497   Basic Metabolic Panel: Recent Labs  Lab 12/23/18 1622 12/23/18 1631 12/24/18 0243  NA 140 136 136  K 3.8 3.8 4.1  CL 101 98 99  CO2 29  --  29  GLUCOSE 77 78 87  BUN 16 16 22   CREATININE 0.47 0.50 0.98  CALCIUM 8.5*  --  8.4*   GFR: Estimated Creatinine Clearance: 23.2 mL/min (by C-G formula based on SCr of 0.98 mg/dL). Liver Function Tests: Recent Labs  Lab 12/23/18 1622 12/24/18 0243  AST 18 14*  ALT 13 12  ALKPHOS 167* 142*  BILITOT 1.0 0.5  PROT 6.1* 5.3*  ALBUMIN 3.4* 2.8*   No results for input(s): LIPASE, AMYLASE in the last 168 hours. No results for input(s): AMMONIA in the last 168 hours. Coagulation Profile: No results for input(s): INR, PROTIME in the last 168 hours. Cardiac Enzymes: Recent Labs  Lab 12/23/18 1622  CKTOTAL 146   BNP (last 3 results) No results for input(s): PROBNP in the last 8760 hours. HbA1C: No results for input(s): HGBA1C in the last 72 hours. CBG: No results for input(s): GLUCAP in the last 168 hours. Lipid Profile: No results for input(s): CHOL, HDL,  LDLCALC, TRIG, CHOLHDL, LDLDIRECT in the last 72 hours. Thyroid Function Tests: No results for input(s): TSH, T4TOTAL, FREET4, T3FREE, THYROIDAB in the last 72 hours. Anemia Panel: No results for input(s): VITAMINB12, FOLATE, FERRITIN, TIBC, IRON, RETICCTPCT in the last 72 hours. Sepsis Labs: No results for input(s): PROCALCITON, LATICACIDVEN in the last 168 hours.  Recent Results (from the past 240 hour(s))  SARS CORONAVIRUS 2 (TAT 6-24 HRS) Nasopharyngeal Nasopharyngeal Swab     Status: None   Collection Time: 12/23/18  4:22 PM   Specimen: Nasopharyngeal Swab  Result Value Ref Range Status   SARS Coronavirus 2 NEGATIVE NEGATIVE Final    Comment: (NOTE) SARS-CoV-2 target nucleic acids are NOT DETECTED. The SARS-CoV-2 RNA is generally detectable in upper and lower respiratory specimens during the acute phase of infection. Negative results do not preclude SARS-CoV-2 infection, do not rule out co-infections with other pathogens, and should not be used as the sole basis for treatment or other patient management decisions. Negative results must be combined with clinical observations, patient history, and epidemiological information. The expected result is Negative. Fact Sheet  for Patients: HairSlick.nohttps://www.fda.gov/media/138098/download Fact Sheet for Healthcare Providers: quierodirigir.comhttps://www.fda.gov/media/138095/download This test is not yet approved or cleared by the [place] FDA and  has been authorized for detection and/or diagnosis of SARS-CoV-2 by FDA under an Emergency Use Authorization (EUA). This EUA will remain  in effect (meaning this test can be used) for the duration of the COVID-19 declaration under Section 56 4(b)(1) of the Act, 21 U.S.C. section 360bbb-3(b)(1), unless the authorization is terminated or revoked sooner. Performed at [facility], [address] [place], [place] [address]          Radiology Studies: CT Knee Right Wo Contrast  Result Date:  12/23/2018 CLINICAL DATA:  Fall off couch today EXAM: CT OF THE right KNEE WITHOUT CONTRAST TECHNIQUE: Multidetector CT imaging of the right knee was performed according to the standard protocol. Multiplanar CT image reconstructions were also generated. COMPARISON:  December 23, 2018 FINDINGS: Bones/Joint/Cartilage There is a thin line of horizontal sclerosis with a possible area of cortical step-off seen at the posterior lateral proximal tibia best seen on series 8, image 22. This could represent area of osseous contusion or nondisplaced fracture. No other fractures are seen. No intra-articular extension. There is diffuse osteopenia. Mild to moderate tricompartmental osteoarthritis is seen with joint space loss and marginal osteophyte formation. Ligaments Suboptimally assessed by CT. Muscles and Tendons The muscles surrounding the knee are normal appearance without evidence of focal atrophy or tear. The patellar and quadriceps tendon are intact. Soft tissues There is a small knee joint effusion. IMPRESSION: Findings which could be suggestive of osseous contusion or nondisplaced fracture of the posterior lateral tibia. Trace knee joint effusion. Mild-to-moderate tricompartmental osteoarthritis. Diffuse osteopenia Electronically Signed   By: Jonna ClarkBindu  Avutu M.D.   On: 12/23/2018 14:17   DG Knee Complete 4 Views Right  Result Date: 12/23/2018 CLINICAL DATA:  Fall, right knee pain. EXAM: RIGHT KNEE - COMPLETE 4+ VIEW COMPARISON:  None. FINDINGS: No evidence of fracture, dislocation, or joint effusion. No evidence of arthropathy or other focal bone abnormality. Soft tissues are unremarkable. Diffuse bony demineralization. IMPRESSION: Osteopenia without signs of fracture. Electronically Signed   By: Donzetta KohutGeoffrey  Wile M.D.   On: 12/23/2018 12:09        Scheduled Meds: . acetaminophen  500 mg Oral TID  . aspirin EC  81 mg Oral Daily  . atorvastatin  10 mg Oral q1800  . enoxaparin (LOVENOX) injection  15 mg  Subcutaneous Q24H  . feeding supplement  1 Container Oral BID BM  . feeding supplement (ENSURE ENLIVE)  237 mL Oral BID BM  . irbesartan  150 mg Oral Daily  . metoprolol tartrate  5 mg Intravenous Once  . metoprolol tartrate  12.5 mg Oral BID  . multivitamin with minerals  1 tablet Oral Daily  . oxyCODONE  10 mg Oral Q12H  . simethicone  80 mg Oral QID   Continuous Infusions:   LOS: 0 days    Time spent: 35 minutes    Ramiro Harvestaniel Cori Henningsen, MD [facility]  If 7PM-7AM, please contact night-coverage [email or web address] 12/24/2018, 5:49 PM

## 2018-12-24 NOTE — TOC Progression Note (Signed)
Transition of Care [facility]) - Progression Note    Patient Details  Name: Julie Durham MRN: [id number] Date of Birth: May 04, 1940  Transition of Care [facility]) CM/SW [place], LCSW Phone Number: 12/24/2018, 2:08 PM  Clinical Narrative:    CSW discussed discharge to SNF with patient son Julie Durham. Patient has bed at [facility]. PT completed evaluation - SNF placement.  CSW initiated [place].  Auth. Pending.    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

## 2018-12-24 NOTE — Plan of Care (Signed)
  Problem: Clinical Measurements: Goal: Respiratory complications will improve Outcome: Progressing   Problem: Clinical Measurements: Goal: Cardiovascular complication will be avoided Outcome: Progressing   Problem: Elimination: Goal: Will not experience complications related to bowel motility Outcome: Progressing   Problem: Elimination: Goal: Will not experience complications related to urinary retention Outcome: Progressing   Problem: Skin Integrity: Goal: Risk for impaired skin integrity will decrease Outcome: Progressing   

## 2018-12-24 NOTE — Progress Notes (Signed)
Initial Nutrition Assessment  DOCUMENTATION CODES:   Severe malnutrition in context of chronic illness, Underweight  INTERVENTION:  - will order Ensure Enlive BID, each supplement provides 350 kcal and 20 grams of protein. - will order Boost Breeze BID, each supplement provides 250 kcal and 9 grams of protein. - will order daily multivitamin with minerals. - continue to encourage PO intakes.    NUTRITION DIAGNOSIS:   Severe Malnutrition related to chronic illness(very extensive PMH) as evidenced by severe fat depletion, severe muscle depletion.  GOAL:   Patient will meet greater than or equal to 90% of their needs  MONITOR:   PO intake, Supplement acceptance, Labs, Weight trends  REASON FOR ASSESSMENT:   Malnutrition Screening Tool  ASSESSMENT:   78 y.o. female with medical history significant of seasonal allergies, normocytic anemia, osteoarthritis, asthma, history of brain mass (see below for details), cataracts, history of viral encephalitis, hyperlipidemia, HTN, mild mitral and tricuspid regurgitation, Takotsubo cardiomyopathy, palpitations, and mild CAD. She presented to the ED after falling off of her couch in her sleep. She was on the floor for about 3 hours before being able to contact anyone.  Patient was able to eat 50% of breakfast this AM. She has extensive PMH. She does not have much of an appetite at baseline and mainly nibbles throughout the day when she feels up to it, but does not typically eat full/large meals.   Per chart review, current weight is 70 lb and this is her highest weight over the past 2 months. Weight in September 2019 was ~78 lb. This indicates 8 lb weight loss (10% body weight) in the past 15 months; not significant for time frame.   Per notes: - closed R tibial fracture - OBS status for pain control   Labs reviewed; Ca: 8.4 mg/dl, Alk Phos elevated, GFR: 55 ml/min. Medications reviewed.     NUTRITION - FOCUSED PHYSICAL EXAM:    Most  Recent Value  Orbital Region  Severe depletion  Upper Arm Region  Severe depletion  Thoracic and Lumbar Region  Severe depletion  Buccal Region  Moderate depletion  Temple Region  Moderate depletion  Clavicle Bone Region  Severe depletion  Clavicle and Acromion Bone Region  Severe depletion  Scapular Bone Region  Unable to assess  Dorsal Hand  Severe depletion  Patellar Region  Severe depletion  Anterior Thigh Region  Severe depletion  Posterior Calf Region  Severe depletion  Edema (RD Assessment)  None  Hair  Reviewed  Eyes  Reviewed  Mouth  Reviewed  Skin  Reviewed  Nails  Reviewed       Diet Order:   Diet Order            Diet Heart Room service appropriate? Yes; Fluid consistency: Thin  Diet effective now              EDUCATION NEEDS:   No education needs have been identified at this time  Skin:  Skin Assessment: Reviewed RN Assessment  Last BM:  12/14  Height:   Ht Readings from Last 1 Encounters:  12/23/18 '4\' 10"'$  (1.473 m)    Weight:   Wt Readings from Last 1 Encounters:  12/23/18 31.6 kg    Ideal Body Weight:  42.2 kg  BMI:  Body mass index is 14.56 kg/m.  Estimated Nutritional Needs:   Kcal:  1265-1515 (40-48 kcal/kg)  Protein:  54-63 grams (1.7-2 grams/kg)  Fluid:  >/= 1.5 L/day     Jarome Matin, MS, RD,  LDN, CNSC Inpatient Clinical Dietitian Pager # [phone] After hours/weekend pager # [phone]

## 2018-12-24 NOTE — Progress Notes (Signed)
Mid level provider Beaulah Corin paged for second time concerning pt urinary retention. Pt reports discomfort in bladder area with clear bladder distention.

## 2018-12-25 ENCOUNTER — Other Ambulatory Visit (HOSPITAL_COMMUNITY): Payer: Medicare Other

## 2018-12-25 DIAGNOSIS — S82201A Unspecified fracture of shaft of right tibia, initial encounter for closed fracture: Secondary | ICD-10-CM | POA: Diagnosis not present

## 2018-12-25 DIAGNOSIS — F05 Delirium due to known physiological condition: Secondary | ICD-10-CM | POA: Diagnosis present

## 2018-12-25 DIAGNOSIS — R000 Tachycardia, unspecified: Secondary | ICD-10-CM | POA: Diagnosis not present

## 2018-12-25 DIAGNOSIS — E785 Hyperlipidemia, unspecified: Secondary | ICD-10-CM | POA: Diagnosis not present

## 2018-12-25 DIAGNOSIS — M25561 Pain in right knee: Secondary | ICD-10-CM | POA: Diagnosis not present

## 2018-12-25 DIAGNOSIS — Z820 Family history of epilepsy and other diseases of the nervous system: Secondary | ICD-10-CM | POA: Diagnosis not present

## 2018-12-25 DIAGNOSIS — Z743 Need for continuous supervision: Secondary | ICD-10-CM | POA: Diagnosis not present

## 2018-12-25 DIAGNOSIS — G9341 Metabolic encephalopathy: Secondary | ICD-10-CM | POA: Diagnosis not present

## 2018-12-25 DIAGNOSIS — Z20828 Contact with and (suspected) exposure to other viral communicable diseases: Secondary | ICD-10-CM | POA: Diagnosis present

## 2018-12-25 DIAGNOSIS — Z66 Do not resuscitate: Secondary | ICD-10-CM | POA: Diagnosis not present

## 2018-12-25 DIAGNOSIS — Z79899 Other long term (current) drug therapy: Secondary | ICD-10-CM | POA: Diagnosis not present

## 2018-12-25 DIAGNOSIS — I5181 Takotsubo syndrome: Secondary | ICD-10-CM | POA: Diagnosis not present

## 2018-12-25 DIAGNOSIS — R638 Other symptoms and signs concerning food and fluid intake: Secondary | ICD-10-CM | POA: Diagnosis not present

## 2018-12-25 DIAGNOSIS — R2689 Other abnormalities of gait and mobility: Secondary | ICD-10-CM | POA: Diagnosis not present

## 2018-12-25 DIAGNOSIS — I10 Essential (primary) hypertension: Secondary | ICD-10-CM | POA: Diagnosis not present

## 2018-12-25 DIAGNOSIS — M810 Age-related osteoporosis without current pathological fracture: Secondary | ICD-10-CM | POA: Diagnosis not present

## 2018-12-25 DIAGNOSIS — Z8249 Family history of ischemic heart disease and other diseases of the circulatory system: Secondary | ICD-10-CM | POA: Diagnosis not present

## 2018-12-25 DIAGNOSIS — E43 Unspecified severe protein-calorie malnutrition: Secondary | ICD-10-CM | POA: Diagnosis not present

## 2018-12-25 DIAGNOSIS — M6281 Muscle weakness (generalized): Secondary | ICD-10-CM | POA: Diagnosis not present

## 2018-12-25 DIAGNOSIS — K5641 Fecal impaction: Secondary | ICD-10-CM | POA: Diagnosis not present

## 2018-12-25 DIAGNOSIS — M79609 Pain in unspecified limb: Secondary | ICD-10-CM | POA: Diagnosis not present

## 2018-12-25 DIAGNOSIS — Z741 Need for assistance with personal care: Secondary | ICD-10-CM | POA: Diagnosis not present

## 2018-12-25 DIAGNOSIS — R5381 Other malaise: Secondary | ICD-10-CM | POA: Diagnosis not present

## 2018-12-25 DIAGNOSIS — R197 Diarrhea, unspecified: Secondary | ICD-10-CM | POA: Diagnosis not present

## 2018-12-25 DIAGNOSIS — S82101A Unspecified fracture of upper end of right tibia, initial encounter for closed fracture: Secondary | ICD-10-CM

## 2018-12-25 DIAGNOSIS — R0602 Shortness of breath: Secondary | ICD-10-CM | POA: Diagnosis not present

## 2018-12-25 DIAGNOSIS — G9340 Encephalopathy, unspecified: Secondary | ICD-10-CM | POA: Diagnosis not present

## 2018-12-25 DIAGNOSIS — Z79891 Long term (current) use of opiate analgesic: Secondary | ICD-10-CM | POA: Diagnosis not present

## 2018-12-25 DIAGNOSIS — E162 Hypoglycemia, unspecified: Secondary | ICD-10-CM | POA: Diagnosis not present

## 2018-12-25 DIAGNOSIS — D649 Anemia, unspecified: Secondary | ICD-10-CM | POA: Diagnosis not present

## 2018-12-25 DIAGNOSIS — Z7401 Bed confinement status: Secondary | ICD-10-CM | POA: Diagnosis not present

## 2018-12-25 DIAGNOSIS — R079 Chest pain, unspecified: Secondary | ICD-10-CM | POA: Diagnosis not present

## 2018-12-25 DIAGNOSIS — H25013 Cortical age-related cataract, bilateral: Secondary | ICD-10-CM | POA: Diagnosis not present

## 2018-12-25 DIAGNOSIS — W19XXXA Unspecified fall, initial encounter: Secondary | ICD-10-CM | POA: Diagnosis not present

## 2018-12-25 DIAGNOSIS — I25119 Atherosclerotic heart disease of native coronary artery with unspecified angina pectoris: Secondary | ICD-10-CM | POA: Diagnosis not present

## 2018-12-25 DIAGNOSIS — I259 Chronic ischemic heart disease, unspecified: Secondary | ICD-10-CM | POA: Diagnosis not present

## 2018-12-25 DIAGNOSIS — M2550 Pain in unspecified joint: Secondary | ICD-10-CM | POA: Diagnosis not present

## 2018-12-25 DIAGNOSIS — R627 Adult failure to thrive: Secondary | ICD-10-CM | POA: Diagnosis not present

## 2018-12-25 DIAGNOSIS — M4856XA Collapsed vertebra, not elsewhere classified, lumbar region, initial encounter for fracture: Secondary | ICD-10-CM | POA: Diagnosis not present

## 2018-12-25 DIAGNOSIS — I2510 Atherosclerotic heart disease of native coronary artery without angina pectoris: Secondary | ICD-10-CM | POA: Diagnosis not present

## 2018-12-25 DIAGNOSIS — S82124A Nondisplaced fracture of lateral condyle of right tibia, initial encounter for closed fracture: Secondary | ICD-10-CM | POA: Diagnosis not present

## 2018-12-25 DIAGNOSIS — J45909 Unspecified asthma, uncomplicated: Secondary | ICD-10-CM | POA: Diagnosis not present

## 2018-12-25 DIAGNOSIS — S82201D Unspecified fracture of shaft of right tibia, subsequent encounter for closed fracture with routine healing: Secondary | ICD-10-CM | POA: Diagnosis not present

## 2018-12-25 DIAGNOSIS — N390 Urinary tract infection, site not specified: Secondary | ICD-10-CM | POA: Diagnosis not present

## 2018-12-25 DIAGNOSIS — R0789 Other chest pain: Secondary | ICD-10-CM | POA: Diagnosis not present

## 2018-12-25 DIAGNOSIS — J449 Chronic obstructive pulmonary disease, unspecified: Secondary | ICD-10-CM | POA: Diagnosis not present

## 2018-12-25 DIAGNOSIS — E876 Hypokalemia: Secondary | ICD-10-CM | POA: Diagnosis not present

## 2018-12-25 DIAGNOSIS — Z9889 Other specified postprocedural states: Secondary | ICD-10-CM | POA: Diagnosis not present

## 2018-12-25 DIAGNOSIS — S82101D Unspecified fracture of upper end of right tibia, subsequent encounter for closed fracture with routine healing: Secondary | ICD-10-CM | POA: Diagnosis not present

## 2018-12-25 DIAGNOSIS — N3000 Acute cystitis without hematuria: Secondary | ICD-10-CM | POA: Diagnosis not present

## 2018-12-25 DIAGNOSIS — R531 Weakness: Secondary | ICD-10-CM | POA: Diagnosis not present

## 2018-12-25 DIAGNOSIS — R1084 Generalized abdominal pain: Secondary | ICD-10-CM | POA: Diagnosis not present

## 2018-12-25 DIAGNOSIS — R001 Bradycardia, unspecified: Secondary | ICD-10-CM | POA: Diagnosis not present

## 2018-12-25 DIAGNOSIS — Z7982 Long term (current) use of aspirin: Secondary | ICD-10-CM | POA: Diagnosis not present

## 2018-12-25 DIAGNOSIS — F0390 Unspecified dementia without behavioral disturbance: Secondary | ICD-10-CM | POA: Diagnosis present

## 2018-12-25 LAB — CBC
HCT: 35.6 % — ABNORMAL LOW (ref 36.0–46.0)
Hemoglobin: 11.1 g/dL — ABNORMAL LOW (ref 12.0–15.0)
MCH: 30.8 pg (ref 26.0–34.0)
MCHC: 31.2 g/dL (ref 30.0–36.0)
MCV: 98.9 fL (ref 80.0–100.0)
Platelets: 308 10*3/uL (ref 150–400)
RBC: 3.6 MIL/uL — ABNORMAL LOW (ref 3.87–5.11)
RDW: 13 % (ref 11.5–15.5)
WBC: 5.4 10*3/uL (ref 4.0–10.5)
nRBC: 0 % (ref 0.0–0.2)

## 2018-12-25 LAB — BASIC METABOLIC PANEL
Anion gap: 7 (ref 5–15)
BUN: 25 mg/dL — ABNORMAL HIGH (ref 8–23)
CO2: 32 mmol/L (ref 22–32)
Calcium: 8.7 mg/dL — ABNORMAL LOW (ref 8.9–10.3)
Chloride: 98 mmol/L (ref 98–111)
Creatinine, Ser: 0.67 mg/dL (ref 0.44–1.00)
GFR calc Af Amer: >60 mL/min (ref >60)
GFR calc non Af Amer: >60 mL/min (ref >60)
Glucose, Bld: 105 mg/dL — ABNORMAL HIGH (ref 70–99)
Potassium: 4.5 mmol/L (ref 3.5–5.1)
Sodium: 137 mmol/L (ref 135–145)

## 2018-12-25 MED ORDER — CHLORHEXIDINE GLUCONATE CLOTH 2 % EX PADS
6.0000 | MEDICATED_PAD | Freq: Every day | CUTANEOUS | Status: DC
  Administered 2018-12-25: 6 via TOPICAL

## 2018-12-25 MED ORDER — OXYCODONE HCL ER 10 MG PO T12A: 10.0000 mg | EXTENDED_RELEASE_TABLET | Freq: Two times a day (BID) | ORAL | 0 refills | Status: DC

## 2018-12-25 NOTE — Discharge Summary (Signed)
Physician Discharge Summary   Patient ID: Julie Durham MRN: [id number] DOB/AGE: November 23, 1940 78 y.o.  Admit date: 12/23/2018 Discharge date: 12/25/2018  Primary Care Physician:  Patient, No Pcp Per   Recommendations for Outpatient Follow-up:  1. Follow-up with Dr.Aluisio in 2 weeks  Home Health: Patient is being discharged to skilled nursing facility Equipment/Devices:   Discharge Condition: stable CODE STATUS: FULL  Diet recommendation: Heart healthy diet   Discharge Diagnoses:    . Closed right tibial fracture, nonoperative currently . Essential hypertension . Mild CAD . Hyperlipidemia Severe protein calorie malnutrition  Consults: Orthopedics, Dr. Lequita Halt    Allergies:   Allergies  Allergen Reactions  . Doxycycline Hyclate Anaphylaxis    Chest pain  . Shellfish Allergy Anaphylaxis  . Demerol Other (See Comments)    Sedation  . Sulfa Antibiotics Other (See Comments)    Unknown rxn     DISCHARGE MEDICATIONS: Allergies as of 12/25/2018      Reactions   Doxycycline Hyclate Anaphylaxis   Chest pain   Shellfish Allergy Anaphylaxis   Demerol Other (See Comments)   Sedation   Sulfa Antibiotics Other (See Comments)   Unknown rxn      Medication List    TAKE these medications   acetaminophen 500 MG tablet Commonly known as: TYLENOL Take 500 mg by mouth daily as needed for mild pain.   aspirin EC 81 MG tablet Take 81 mg by mouth daily.   atorvastatin 10 MG tablet Commonly known as: LIPITOR Take 1 tablet (10 mg total) by mouth daily at 6 PM.   CALCIUM + VITAMIN D3 PO Take 1 tablet by mouth 2 (two) times daily.   irbesartan 150 MG tablet Commonly known as: AVAPRO Take 1 tablet (150 mg total) by mouth daily.   metoprolol tartrate 25 MG tablet Commonly known as: LOPRESSOR TAKE 1/2 TABLET(12.5 MG) BY MOUTH TWICE DAILY What changed: See the new instructions.   oxyCODONE 10 mg 12 hr tablet Commonly known as: OXYCONTIN Take 1 tablet (10 mg  total) by mouth every 12 (twelve) hours.   PreserVision AREDS 2 Caps Take 1 capsule by mouth 2 (two) times daily.        Brief H and P: For complete details please refer to admission H and P, but in brief HPI per Dr. Candise Bowens E Austinis a 78 y.o.femalewith medical history significant ofseasonal allergies, normocytic anemia, osteoarthritis, asthma, history of brain mass (see below for details), cataracts, history of viral encephalitis, hyperlipidemia, hypertension, mild mitral regurgitation, mild tricuspid regurgitation, Takotsubo cardiomyopathy, palpitations, mild CAD who is brought to the emergency department after sleeping off her couch and falling to the floor at home injuring her right knee around 0700.She stated that she was on the floor for about 3 hours before she was able to contact anybody.She stated that this was an accident and denies chest pain, dyspnea, palpitations, dizziness, diaphoresis, nausea, emesis, focal weakness or numbness. She also denies fever, chills, headache, abdominal pain, constipation, melena or hematochezia. She occasionally gets loose stools. She denies dysuria, frequency or hematuria. No polyuria, polydipsia, polyphagia or blurred vision.  ED Course:Her CBC showed a white count of 11.8 g/dL, but was otherwise normal. CMP shows total protein of 6.1 albumin of 3.4 g/dL. Her alkaline phosphatase was 767 units/L. All other values are within normal range. Imaging on CT shows osseous contusion versus nondisplaced fracture of the posterior lateral tibia with a trace joint effusion. There is mild to moderate tricompartmental osteoarthritis. Diffuse osteopenia.   Hospital  Course:   Closed right tibia fracture -Patient presented with fall and CT of the knee showed closed right tibia fracture.  Mild to moderate tricompartmental osteoarthritis, diffuse osteopenia -Patient with significant pain and unable to bear weight at the time of admission.   Orthopedics was consulted, Dr. Wynelle Link recommended knee immobilizer with outpatient follow-up and pain management -Patient has been working with physical therapy, recommended skilled nursing facility placement for rehab.  She required maximal assistance and not safe for discharge home, increased risk for falling again in the setting of her acute fracture. -Outpatient follow-up with orthopedics in 2 weeks   Essential hypertension Stable, continue Lopressor, Avapro   Hyperlipidemia -Continue statin   Mild coronary artery disease -Stable, no chest pain or shortness of breath.  Continue Avapro, Lopressor, statin, aspirin  Severe protein calorie malnutrition -Continue nutritional supplements    Day of Discharge S: Working with physical therapy, feeling better, pain controlled with current regimen.  BP (!) 179/78 (BP Location: Right Arm)   Pulse 72   Temp 97.8 F (36.6 C) (Oral)   Resp 15   Ht 4\' 10"  (1.473 m)   Wt 31.6 kg   SpO2 99%   BMI 14.56 kg/m   Physical Exam: General: Alert and awake oriented x3 not in any acute distress. HEENT: anicteric sclera, pupils reactive to light and accommodation CVS: S1-S2 clear no murmur rubs or gallops Chest: clear to auscultation bilaterally, no wheezing rales or rhonchi Abdomen: soft nontender, nondistended, normal bowel sounds Extremities: no cyanosis, clubbing or edema noted bilaterally Neuro: No new deficits   The results of significant diagnostics from this hospitalization (including imaging, microbiology, ancillary and laboratory) are listed below for reference.      Procedures/Studies:  CT Knee Right Wo Contrast  Result Date: 12/23/2018 CLINICAL DATA:  Fall off couch today EXAM: CT OF THE right KNEE WITHOUT CONTRAST TECHNIQUE: Multidetector CT imaging of the right knee was performed according to the standard protocol. Multiplanar CT image reconstructions were also generated. COMPARISON:  December 23, 2018 FINDINGS:  Bones/Joint/Cartilage There is a thin line of horizontal sclerosis with a possible area of cortical step-off seen at the posterior lateral proximal tibia best seen on series 8, image 22. This could represent area of osseous contusion or nondisplaced fracture. No other fractures are seen. No intra-articular extension. There is diffuse osteopenia. Mild to moderate tricompartmental osteoarthritis is seen with joint space loss and marginal osteophyte formation. Ligaments Suboptimally assessed by CT. Muscles and Tendons The muscles surrounding the knee are normal appearance without evidence of focal atrophy or tear. The patellar and quadriceps tendon are intact. Soft tissues There is a small knee joint effusion. IMPRESSION: Findings which could be suggestive of osseous contusion or nondisplaced fracture of the posterior lateral tibia. Trace knee joint effusion. Mild-to-moderate tricompartmental osteoarthritis. Diffuse osteopenia Electronically Signed   By: Prudencio Pair M.D.   On: 12/23/2018 14:17   DG Knee Complete 4 Views Right  Result Date: 12/23/2018 CLINICAL DATA:  Fall, right knee pain. EXAM: RIGHT KNEE - COMPLETE 4+ VIEW COMPARISON:  None. FINDINGS: No evidence of fracture, dislocation, or joint effusion. No evidence of arthropathy or other focal bone abnormality. Soft tissues are unremarkable. Diffuse bony demineralization. IMPRESSION: Osteopenia without signs of fracture. Electronically Signed   By: Zetta Bills M.D.   On: 12/23/2018 12:09       LAB RESULTS: Basic Metabolic Panel: Recent Labs  Lab 12/24/18 0243 12/25/18 0249  NA 136 137  K 4.1 4.5  CL 99 98  CO2 29 32  GLUCOSE 87 105*  BUN 22 25*  CREATININE 0.98 0.67  CALCIUM 8.4* 8.7*   Liver Function Tests: Recent Labs  Lab 12/23/18 1622 12/24/18 0243  AST 18 14*  ALT 13 12  ALKPHOS 167* 142*  BILITOT 1.0 0.5  PROT 6.1* 5.3*  ALBUMIN 3.4* 2.8*   No results for input(s): LIPASE, AMYLASE in the last 168 hours. No results  for input(s): AMMONIA in the last 168 hours. CBC: Recent Labs  Lab 12/23/18 1622 12/24/18 0243 12/25/18 0249  WBC 7.3 4.9 5.4  NEUTROABS 5.5  --   --   HGB 11.8* 10.9* 11.1*  HCT 37.0 34.1* 35.6*  MCV 96.9 97.7 98.9  PLT 294 276 308   Cardiac Enzymes: Recent Labs  Lab 12/23/18 1622  CKTOTAL 146   BNP: Invalid input(s): POCBNP CBG: No results for input(s): GLUCAP in the last 168 hours.    Disposition and Follow-up: Discharge Instructions    Diet - low sodium heart healthy   Complete by: As directed    Increase activity slowly   Complete by: As directed        DISPOSITION: Skilled nursing facility   DISCHARGE FOLLOW-UP  Contact information for follow-up providers    Ollen GrossAluisio, Frank, MD Follow up in 2 week(s).   Specialty: Orthopedic Surgery Contact information: [address] [place] [id number] [phone]            Contact information for after-discharge care    Dest[facility] .   Service: Skilled Nursing Contact information: [address] [place] [id number] [phone]                   Time coordinating discharge:  35 minutes  Signed:   Thad Rangeripudeep Shakita Keir M.D. [facility] 12/25/2018, 12:05 PM

## 2018-12-25 NOTE — Progress Notes (Signed)
Occupational Therapy Evaluation Patient Details Name: Julie Durham MRN: [id number] DOB: Nov 21, 1940 Today's Date: 12/25/2018    History of Present Illness 78 yo female admitted to ED on 12/14 for fall off couch resulting in closed R tibial fracture, being managed conservatively. PMH includes anemia, brain mass, encephalitis, takotsubo cardiomyopathy, HTN, HLD, kyphoplasty L1 and L4 10/2018.   Clinical Impression   PTA, pt was living at home alone, and reports she was independent with ADL/IADL and modified independent with functional mobility at single point cane level. Pt currently requires minA for bed mobility and minA-modA for functional mobility during ADL. She completed grooming at sink level with minA for stability and demonstrated heavy reliance on single upper extremity support during functional task. Due to decline in current level of function, pt would benefit from acute OT to address established goals to facilitate safe D/C to venue listed below. At this time, recommend SNF follow-up. Anticipated d/c this date per MD. All additional OT needs to be addressed at next venue. No additional acute OT needs identified. OT will sign off. Thank you for referral.      Follow Up Recommendations  Supervision/Assistance - 24 hour;SNF    Equipment Recommendations  None recommended by OT    Recommendations for Other Services       Precautions / Restrictions Precautions Precautions: Fall Required Braces or Orthoses: Knee Immobilizer - Right Knee Immobilizer - Right: On at all times Restrictions Weight Bearing Restrictions: Yes Other Position/Activity Restrictions: WBAT      Mobility Bed Mobility Overal bed mobility: Needs Assistance Bed Mobility: Supine to Sit     Supine to sit: Min assist     General bed mobility comments: minA for safety and progressing trunk to upright posture  Transfers Overall transfer level: Needs assistance Equipment used: Rolling walker (2  wheeled) Transfers: Sit to/from [employer] CorporationStand;Stand Pivot Transfers Sit to Stand: Min assist Stand pivot transfers: Min assist;Mod assist       General transfer comment: minA for powerup into standing;pt able to tolerate about 50% of weight through RLE;modA during stand pivot for sequencing and stability especially with steps backwards    Balance Overall balance assessment: Needs assistance;History of Falls Sitting-balance support: Single extremity supported;Feet supported Sitting balance-Leahy Scale: Fair Sitting balance - Comments: decreased tolerance in sitting but able to maintain stability with single upper extremity support Postural control: Posterior lean Standing balance support: Single extremity supported;During functional activity Standing balance-Leahy Scale: Poor Standing balance comment: heavy reliance on external support                           ADL either performed or assessed with clinical judgement   ADL Overall ADL's : Needs assistance/impaired Eating/Feeding: Set up;Sitting   Grooming: Minimal assistance;Standing Grooming Details (indicate cue type and reason): oral care completed at sink level Upper Body Bathing: Min guard;Sitting   Lower Body Bathing: Minimal assistance;Sit to/from stand   Upper Body Dressing : Min guard;Sitting   Lower Body Dressing: Minimal assistance;Sit to/from stand;Moderate assistance Lower Body Dressing Details (indicate cue type and reason): assistance to don shoes  Toilet Transfer: Minimal assistance;Stand-pivot;Ambulation;RW Toilet Transfer Details (indicate cue type and reason): simulated in room with about 5 steps to sink, then stand pivot to recliner Toileting- Clothing Manipulation and Hygiene: Minimal assistance;Sit to/from stand Toileting - Clothing Manipulation Details (indicate cue type and reason): simulated     Functional mobility during ADLs: Minimal assistance;Rolling walker General ADL Comments: pt completed in  room mobility with minA for stability at RW level;minA provided throughout grooming at sink level;pt demonstrates decreased stability     Vision Baseline Vision/History: (blind in R eye;) Patient Visual Report: No change from baseline Additional Comments: 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;reported red button on phone is seen as black to her      Perception     Praxis      Pertinent Vitals/Pain Pain Assessment: 0-10 Pain Score: 5  Pain Location: R knee Pain Descriptors / Indicators: Sore;Discomfort Pain Intervention(s): Limited activity within patient's tolerance;Monitored during session     Hand Dominance Right   Extremity/Trunk Assessment Upper Extremity Assessment Upper Extremity Assessment: Generalized weakness   Lower Extremity Assessment Lower Extremity Assessment: Generalized weakness   Cervical / Trunk Assessment Cervical / Trunk Assessment: Kyphotic(history of kyphoplasty L1, L4)   Communication Communication Communication: HOH   Cognition Arousal/Alertness: Awake/alert Behavior During Therapy: WFL for tasks assessed/performed Overall Cognitive Status: Within Functional Limits for tasks assessed                                     General Comments  vss throughout    Exercises     Shoulder Instructions      Home Living Family/patient expects to be discharged to:: Skilled nursing facility Living Arrangements: Alone                               Additional Comments: per daughter, plan for pt to d/c to [place]      Prior Functioning/Environment Level of Independence: Independent with assistive device(s)        Comments: pt states she was living alone PTA, with history of "some falls, none major". Pt states she uses 2 canes, one in each hand, to ambulate at home. Pt states her family lives in [place], but cannot stay with her and they have been trying to get her placed in [place].        OT Problem List:  Decreased strength;Decreased range of motion;Decreased activity tolerance;Impaired balance (sitting and/or standing);Impaired vision/perception;Decreased safety awareness;Decreased knowledge of precautions;Pain      OT Treatment/Interventions: Self-care/ADL training;Therapeutic exercise;Energy conservation;DME and/or AE instruction;Therapeutic activities;Visual/perceptual remediation/compensation;Patient/family education;Balance training    OT Goals(Current goals can be found in the care plan section) Acute Rehab OT Goals Patient Stated Goal: regain independence OT Goal Formulation: With patient Time For Goal Achievement: 01/08/19 Potential to Achieve Goals: Good  OT Frequency: Min 2X/week   Barriers to D/C: Decreased caregiver support  pt lives alone       Co-evaluation              AM-PAC OT "6 Clicks" Daily Activity     Outcome Measure Help from another person eating meals?: A Little Help from another person taking care of personal grooming?: A Little Help from another person toileting, which includes using toliet, bedpan, or urinal?: A Little Help from another person bathing (including washing, rinsing, drying)?: A Little Help from another person to put on and taking off regular upper body clothing?: A Little Help from another person to put on and taking off regular lower body clothing?: A Lot 6 Click Score: 17   End of Session Equipment Utilized During Treatment: Gait belt;Rolling walker;Right knee immobilizer Nurse Communication: Mobility status  Activity Tolerance: Patient tolerated treatment well Patient left: in chair;with call bell/phone  within reach;with chair alarm set  OT Visit Diagnosis: Unsteadiness on feet (R26.81);Other abnormalities of gait and mobility (R26.89);Muscle weakness (generalized) (M62.81);Repeated falls (R29.6);History of falling (Z91.81);Low vision, both eyes (H54.2);Pain Pain - Right/Left: Right Pain - part of body: Leg                Time:  0920-0943 OT Time Calculation (min): 23 min Charges:  OT General Charges $OT Visit: 1 Visit OT Evaluation $OT Eval Moderate Complexity: 1 Mod OT Treatments $Self Care/Home Management : 8-22 mins  Diona Browner OTR/L Acute Rehabilitation Services Office: [phone]   Rebeca Alert 12/25/2018, 9:58 AM

## 2018-12-25 NOTE — TOC Transition Note (Addendum)
Transition of Care [facility]) - CM/SW Discharge Note   Patient Details  Name: Julie Durham MRN: [id number] Date of Birth: 19-Mar-1940  Transition of [facility] CM/SW Contact:  Lia Hopping, [place] Phone Number: 12/25/2018, 10:16 AM   Clinical Narrative:    Authorization Received. #[id number]. [facility] [place] ready to accept the patient today.  Nurse call report to: [phone] [facility] to transport.   Final next level of care: Skilled Nursing Facility Barriers to Discharge: Continued Medical Work up, Ship broker   Patient Goals and CMS Choice Patient states their goals for this hospitalization and ongoing recovery are:: Per family to not return home until safe to live alone.      Discharge Placement PASRR number recieved: 12/24/18            Patient [facility] at: [facility]) Patient to be transferred to facility by: [facility] Name of family member notified: Son-David Patient and family notified of of transfer: 12/25/18  Discharge Plan and Services                                     Social Determinants of Health (SDOH) Interventions     Readmission Risk Interventions No flowsheet data found.

## 2018-12-25 NOTE — Progress Notes (Signed)
Report called to Maynard at [facility] facility. Awaiting [facility] transport. All belongings w/ patient. Will monitor until d/c.

## 2018-12-26 ENCOUNTER — Emergency Department (HOSPITAL_COMMUNITY)
Admission: EM | Admit: 2018-12-26 | Discharge: 2018-12-26 | Disposition: A | Discharge status: Home / Self Care | Payer: Medicare Other | Attending: Emergency Medicine | Admitting: Emergency Medicine

## 2018-12-26 ENCOUNTER — Other Ambulatory Visit: Payer: Self-pay

## 2018-12-26 ENCOUNTER — Emergency Department (HOSPITAL_BASED_OUTPATIENT_CLINIC_OR_DEPARTMENT_OTHER)
Admit: 2018-12-26 | Discharge: 2018-12-26 | Disposition: A | Discharge status: Home / Self Care | Payer: Medicare Other | Attending: Emergency Medicine | Admitting: Emergency Medicine

## 2018-12-26 ENCOUNTER — Emergency Department (HOSPITAL_COMMUNITY): Payer: Medicare Other

## 2018-12-26 DIAGNOSIS — Z20828 Contact with and (suspected) exposure to other viral communicable diseases: Secondary | ICD-10-CM | POA: Insufficient documentation

## 2018-12-26 DIAGNOSIS — R0602 Shortness of breath: Secondary | ICD-10-CM | POA: Insufficient documentation

## 2018-12-26 DIAGNOSIS — Z7982 Long term (current) use of aspirin: Secondary | ICD-10-CM | POA: Diagnosis not present

## 2018-12-26 DIAGNOSIS — M79609 Pain in unspecified limb: Secondary | ICD-10-CM

## 2018-12-26 DIAGNOSIS — R0789 Other chest pain: Secondary | ICD-10-CM | POA: Diagnosis not present

## 2018-12-26 DIAGNOSIS — J45909 Unspecified asthma, uncomplicated: Secondary | ICD-10-CM | POA: Insufficient documentation

## 2018-12-26 DIAGNOSIS — I259 Chronic ischemic heart disease, unspecified: Secondary | ICD-10-CM | POA: Diagnosis not present

## 2018-12-26 DIAGNOSIS — R079 Chest pain, unspecified: Secondary | ICD-10-CM

## 2018-12-26 DIAGNOSIS — I10 Essential (primary) hypertension: Secondary | ICD-10-CM | POA: Diagnosis not present

## 2018-12-26 DIAGNOSIS — Z79899 Other long term (current) drug therapy: Secondary | ICD-10-CM | POA: Diagnosis not present

## 2018-12-26 LAB — CBC WITH DIFFERENTIAL/PLATELET
Abs Immature Granulocytes: 0.03 10*3/uL (ref 0.00–0.07)
Basophils Absolute: 0 10*3/uL (ref 0.0–0.1)
Basophils Relative: 0 %
Eosinophils Absolute: 0.1 10*3/uL (ref 0.0–0.5)
Eosinophils Relative: 1 %
HCT: 34.8 % — ABNORMAL LOW (ref 36.0–46.0)
Hemoglobin: 11.4 g/dL — ABNORMAL LOW (ref 12.0–15.0)
Immature Granulocytes: 1 %
Lymphocytes Relative: 16 %
Lymphs Abs: 0.8 10*3/uL (ref 0.7–4.0)
MCH: 30.8 pg (ref 26.0–34.0)
MCHC: 32.8 g/dL (ref 30.0–36.0)
MCV: 94.1 fL (ref 80.0–100.0)
Monocytes Absolute: 0.4 10*3/uL (ref 0.1–1.0)
Monocytes Relative: 7 %
Neutro Abs: 4 10*3/uL (ref 1.7–7.7)
Neutrophils Relative %: 75 %
Platelets: 302 10*3/uL (ref 150–400)
RBC: 3.7 MIL/uL — ABNORMAL LOW (ref 3.87–5.11)
RDW: 12.5 % (ref 11.5–15.5)
WBC: 5.2 10*3/uL (ref 4.0–10.5)
nRBC: 0 % (ref 0.0–0.2)

## 2018-12-26 LAB — BRAIN NATRIURETIC PEPTIDE: B Natriuretic Peptide: 193.2 pg/mL — ABNORMAL HIGH (ref 0.0–100.0)

## 2018-12-26 LAB — COMPREHENSIVE METABOLIC PANEL
ALT: 13 U/L (ref 0–44)
AST: 16 U/L (ref 15–41)
Albumin: 2.8 g/dL — ABNORMAL LOW (ref 3.5–5.0)
Alkaline Phosphatase: 165 U/L — ABNORMAL HIGH (ref 38–126)
Anion gap: 8 (ref 5–15)
BUN: 11 mg/dL (ref 8–23)
CO2: 30 mmol/L (ref 22–32)
Calcium: 8.6 mg/dL — ABNORMAL LOW (ref 8.9–10.3)
Chloride: 99 mmol/L (ref 98–111)
Creatinine, Ser: 0.46 mg/dL (ref 0.44–1.00)
GFR calc Af Amer: >60 mL/min (ref >60)
GFR calc non Af Amer: >60 mL/min (ref >60)
Glucose, Bld: 97 mg/dL (ref 70–99)
Potassium: 3.9 mmol/L (ref 3.5–5.1)
Sodium: 137 mmol/L (ref 135–145)
Total Bilirubin: 0.5 mg/dL (ref 0.3–1.2)
Total Protein: 5.5 g/dL — ABNORMAL LOW (ref 6.5–8.1)

## 2018-12-26 LAB — PROTIME-INR
INR: 1 (ref 0.8–1.2)
Prothrombin Time: 12.9 seconds (ref 11.4–15.2)

## 2018-12-26 LAB — LACTIC ACID, PLASMA
Lactic Acid, Venous: 0.9 mmol/L (ref 0.5–1.9)
Lactic Acid, Venous: 0.8 mmol/L (ref 0.5–1.9)

## 2018-12-26 LAB — TROPONIN I (HIGH SENSITIVITY)
Troponin I (High Sensitivity): 8 ng/L (ref <18)
Troponin I (High Sensitivity): 8 ng/L (ref <18)

## 2018-12-26 LAB — LIPASE, BLOOD: Lipase: 32 U/L (ref 11–51)

## 2018-12-26 LAB — POC SARS CORONAVIRUS 2 AG -  ED: SARS Coronavirus 2 Ag: NEGATIVE

## 2018-12-26 MED ORDER — IOHEXOL 350 MG/ML SOLN
50.0000 mL | Freq: Once | INTRAVENOUS | Status: AC | PRN
  Administered 2018-12-26: 35 mL via INTRAVENOUS

## 2018-12-26 NOTE — Progress Notes (Signed)
Right lower extremity venous duplex has been completed. Preliminary results can be found in CV Proc through chart review.  Results were given to Dr. Sherry Ruffing.  12/26/18 10:16 AM Julie Durham RVT

## 2018-12-26 NOTE — ED Provider Notes (Signed)
[facility] EMERGENCY DEPARTMENT Provider Note   CSN: [id number] Arrival date & time: 12/26/18  4174     History Chief Complaint  Patient presents with  . Chest Pain    Julie Durham is a 78 y.o. female.  The history is provided by the patient and medical records. No language interpreter was used.  Chest Pain Pain location:  L chest Pain quality: crushing and pressure   Pain radiates to:  Does not radiate Pain severity:  Severe Onset quality:  Sudden Duration:  1 day Timing:  Constant Progression:  Waxing and waning Chronicity:  New Context: breathing   Relieved by:  Nothing Worsened by:  Deep breathing Ineffective treatments:  None tried Associated symptoms: shortness of breath   Associated symptoms: no abdominal pain, no back pain, no cough, no diaphoresis, no fatigue, no fever, no headache, no heartburn, no lower extremity edema, no nausea, no near-syncope, no palpitations, no syncope and no vomiting   Risk factors: coronary artery disease and immobilization   Risk factors: not female and no prior DVT/PE        Past Medical History:  Diagnosis Date  . Allergy   . Anemia   . Arthritis   . Asthma   . Brain mass    a. 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  . Cataract   . Elevated troponin   . Encephalitis, viral    a. 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 Hyperlipidemia   . Hypertension   . Mild CAD per cath 10/2018   . Mild mitral regurgitation    a. Echo 2011: LVEF >55% w/ grade 1 DD, mild MR, mild TR, elevated RVSP 30-54mmHg  . Mild tricuspid regurgitation   . Palpitations   . Takotsubo cardiomyopathy     Patient Active Problem List   Diagnosis Date Noted  . Protein-calorie malnutrition, severe 12/24/2018  . Acute pain of right knee   . Closed  right tibial fracture 12/23/2018  . Mild CAD 10/24/2018  . S/P kyphoplasty 10/24/2018  . Stress fracture of lumbar vertebra 10/24/2018  . Systolic dysfunction 08/22/4816  . Elevated troponin 10/24/2018  . Chest pain  10/24/2018  . Brain mass 10/24/2018  . Takotsubo syndrome   . Lumbar compression fracture ([place]) 10/22/2018  . Hyperlipidemia 02/28/2017  . Oropharyngeal dysphagia 10/04/2016  . Other bursal cyst, left elbow 10/04/2016  . Pill esophagitis 10/04/2016  . Infected insect bite 06/29/2016  . Retained foreign body in soft tissue 06/29/2016  . Essential hypertension 11/18/2012  . Palpitations 11/18/2012    Past Surgical History:  Procedure Laterality Date  . APPENDECTOMY    . CHOLECYSTECTOMY    . DOPPLER ECHOCARDIOGRAPHY  2011  . EYE SURGERY    . KYPHOPLASTY Bilateral 10/22/2018   Procedure: KYPHOPLASTY LUMBAR ONE, LUMBAR FOUR;  Surgeon: Consuella Lose, MD;  Location: [facility];  Service: Neurosurgery;  Laterality: Bilateral;  KYPHOPLASTY LUMBAR ONE, LUMBAR FOUR  . LEFT HEART CATH AND CORONARY ANGIOGRAPHY N/A 10/23/2018   Procedure: LEFT HEART CATH AND CORONARY ANGIOGRAPHY;  Surgeon: Sherren Mocha, MD;  Location: [facility];  Service: Cardiovascular;  Laterality: N/A;  . TUBAL LIGATION       OB History   No obstetric history on file.     Family History  Problem Relation Age of Onset  . Alzheimer's  disease Mother   . Heart attack Father   . Heart disease Brother   . Diabetes Son   . Sensorineural hearing loss Sister   . Heart disease Brother   . Heart disease Brother   . Thyroid disease Brother     Social History   Tobacco Use  . Smoking status: Never Smoker  . Smokeless tobacco: Never Used  Substance Use Topics  . Alcohol use: Never  . Drug use: Never    Home Medications Prior to Admission medications   Medication Sig Start Date End Date Taking? Authorizing Provider  acetaminophen (TYLENOL) 500 MG tablet Take 500 mg by mouth daily as  needed for mild pain.     [provider]  aspirin EC 81 MG tablet Take 81 mg by mouth daily.     [provider]  atorvastatin (LIPITOR) 10 MG tablet Take 1 tablet (10 mg total) by mouth daily at 6 PM. 10/24/18   Furth, Cadence H, PA-C  Calcium Carb-Cholecalciferol (CALCIUM + VITAMIN D3 PO) Take 1 tablet by mouth 2 (two) times daily.    [provider]  irbesartan (AVAPRO) 150 MG tablet Take 1 tablet (150 mg total) by mouth daily. 10/21/18   Marjie Skiff E, PA-C  metoprolol tartrate (LOPRESSOR) 25 MG tablet TAKE 1/2 TABLET(12.5 MG) BY MOUTH TWICE DAILY Patient taking differently: Take 12.5 mg by mouth 2 (two) times daily.  11/19/18   Runell Gess, MD  Multiple Vitamins-Minerals (PRESERVISION AREDS 2) CAPS Take 1 capsule by mouth 2 (two) times daily.    [provider]  oxyCODONE (OXYCONTIN) 10 mg 12 hr tablet Take 1 tablet (10 mg total) by mouth every 12 (twelve) hours. 12/25/18   Rai, Delene Ruffini, MD    Allergies    Doxycycline hyclate, Shellfish allergy, Demerol, and Sulfa antibiotics  Review of Systems   Review of Systems  Constitutional: Positive for chills. Negative for diaphoresis, fatigue and fever.  HENT: Negative for congestion.   Respiratory: Positive for shortness of breath. Negative for cough, chest tightness, wheezing and stridor.   Cardiovascular: Positive for chest pain. Negative for palpitations, syncope and near-syncope.  Gastrointestinal: Negative for abdominal pain, constipation, diarrhea, heartburn, nausea and vomiting.  Genitourinary: Negative for flank pain and frequency.  Musculoskeletal: Negative for back pain, neck pain and neck stiffness.  Skin: Negative for rash and wound.  Neurological: Negative for light-headedness and headaches.  Psychiatric/Behavioral: Negative for agitation and confusion.  All other systems reviewed and are negative.   Physical Exam Updated Vital Signs BP (!) 145/94 (BP Location: Left Arm)    Pulse 94   Temp 98.1 F (36.7 C) (Oral)   Resp 14   SpO2 96%   Physical Exam Vitals and nursing note reviewed.  Constitutional:      General: She is not in acute distress.    Appearance: She is well-developed. She is not ill-appearing, toxic-appearing or diaphoretic.  HENT:     Head: Normocephalic and atraumatic.     Right Ear: External ear normal.     Left Ear: External ear normal.     Nose: Nose normal.     Mouth/Throat:     Pharynx: No oropharyngeal exudate.  Eyes:     Conjunctiva/sclera: Conjunctivae normal.     Pupils: Pupils are equal, round, and reactive to light.  Cardiovascular:     Rate and Rhythm: Regular rhythm. Tachycardia present.     Heart sounds: Normal heart sounds. No murmur. No systolic murmur.  Pulmonary:  Effort: No tachypnea or respiratory distress.     Breath sounds: No stridor. Rhonchi present. No decreased breath sounds, wheezing or rales.  Abdominal:     General: There is no distension.     Tenderness: There is no abdominal tenderness. There is no rebound.  Musculoskeletal:     Cervical back: Normal range of motion and neck supple.     Right lower leg: Tenderness present.  Skin:    General: Skin is warm.     Capillary Refill: Capillary refill takes less than 2 seconds.     Findings: No erythema or rash.  Neurological:     General: No focal deficit present.     Mental Status: She is alert and oriented to person, place, and time.     Motor: No abnormal muscle tone.     Coordination: Coordination normal.     Deep Tendon Reflexes: Reflexes are normal and symmetric.  Psychiatric:        Mood and Affect: Mood normal. Mood is not anxious.     ED Results / Procedures / Treatments   Labs (all labs ordered are listed, but only abnormal results are displayed) Labs Reviewed  CBC WITH DIFFERENTIAL/PLATELET - Abnormal; Notable for the following components:      Result Value   RBC 3.70 (*)    Hemoglobin 11.4 (*)    HCT 34.8 (*)    All other  components within normal limits  COMPREHENSIVE METABOLIC PANEL - Abnormal; Notable for the following components:   Calcium 8.6 (*)    Total Protein 5.5 (*)    Albumin 2.8 (*)    Alkaline Phosphatase 165 (*)    All other components within normal limits  BRAIN NATRIURETIC PEPTIDE - Abnormal; Notable for the following components:   B Natriuretic Peptide 193.2 (*)    All other components within normal limits  LIPASE, BLOOD  LACTIC ACID, PLASMA  LACTIC ACID, PLASMA  PROTIME-INR  POC SARS CORONAVIRUS 2 AG -  ED  TROPONIN I (HIGH SENSITIVITY)  TROPONIN I (HIGH SENSITIVITY)    EKG EKG Interpretation  Date/Time:  Thursday December 26 2018 06:59:58 EST Ventricular Rate:  110 PR Interval:    QRS Duration: 106 QT Interval:  324 QTC Calculation: 439 R Axis:   -16 Text Interpretation: Sinus tachycardia Borderline left axis deviation Probable anterior infarct, age indeterminate When compared to prior, less PVC. No STEMI Confirmed by Theda Belfast ([id number]) on 12/26/2018 8:36:26 AM     Radiology CT Angio Chest PE W and/or Wo Contrast  Result Date: 12/26/2018 CLINICAL DATA:  Chest pain. EXAM: CT ANGIOGRAPHY CHEST WITH CONTRAST TECHNIQUE: Multidetector CT imaging of the chest was performed using the standard protocol during bolus administration of intravenous contrast. Multiplanar CT image reconstructions and MIPs were obtained to evaluate the vascular anatomy. CONTRAST:  35mL OMNIPAQUE IOHEXOL 350 MG/ML SOLN COMPARISON:  None. FINDINGS: Cardiovascular: Satisfactory opacification of the pulmonary arteries to the segmental level. No evidence of pulmonary embolism. Normal heart size. No pericardial effusion. Atherosclerosis of thoracic aorta is noted without aneurysm or dissection. Mediastinum/Nodes: No enlarged mediastinal, hilar, or axillary lymph nodes. Thyroid gland, trachea, and esophagus demonstrate no significant findings. Lungs/Pleura: No pneumothorax or significant pleural effusion is  noted. Minimal bilateral posterior basilar subsegmental atelectasis is noted. Minimal biapical scarring is noted. Upper Abdomen: No acute abnormality. Musculoskeletal: No chest wall abnormality. No acute or significant osseous findings. Review of the MIP images confirms the above findings. IMPRESSION: 1. No definite evidence of pulmonary embolus. 2. Aortic  atherosclerosis. Aortic Atherosclerosis (ICD10-I70.0). Electronically Signed   By: Lupita Raider M.D.   On: 12/26/2018 09:54   DG Chest Portable 1 View  Result Date: 12/26/2018 CLINICAL DATA:  Short of breath with chest pain. EXAM: PORTABLE CHEST 1 VIEW COMPARISON:  10/23/2018 FINDINGS: Midline trachea. Normal heart size. Atherosclerosis in the transverse aorta. No pleural effusion or pneumothorax. Biapical pleural thickening. No lobar consolidation. Mild volume loss and subsegmental atelectasis left lung base. Osteopenia with thoracolumbar compression deformities and prior vertebral augmentation, suboptimally evaluated. Cholecystectomy clips IMPRESSION: 1. No acute cardiopulmonary disease. 2. Aortic atherosclerosis. 3. Hyperinflation, suggesting COPD. Electronically Signed   By: Jeronimo Greaves M.D.   On: 12/26/2018 08:13   VAS [place] LOWER EXTREMITY VENOUS (DVT) (ONLY [facility] & [facility])  Result Date: 12/26/2018  Lower Venous Study Indications: Pain.  Risk Factors: None identified. Limitations: Body habitus, poor ultrasound/tissue interface, orthopaedic appliance and knee immobilizer. Comparison Study: No prior studies. Performing Technologist: Chanda Busing RVT  Examination Guidelines: A complete evaluation includes B-mode imaging, spectral Doppler, color Doppler, and power Doppler as needed of all accessible portions of each vessel. Bilateral testing is considered an integral part of a complete examination. Limited examinations for reoccurring indications may be performed as noted.  +---------+---------------+---------+-----------+----------+--------------+ RIGHT     CompressibilityPhasicitySpontaneityPropertiesThrombus Aging +---------+---------------+---------+-----------+----------+--------------+ CFV      Full           Yes      Yes                                 +---------+---------------+---------+-----------+----------+--------------+ SFJ      Full                                                        +---------+---------------+---------+-----------+----------+--------------+ FV Prox  Full                                                        +---------+---------------+---------+-----------+----------+--------------+ FV Mid   Full                                                        +---------+---------------+---------+-----------+----------+--------------+ FV DistalFull                                                        +---------+---------------+---------+-----------+----------+--------------+ PFV      Full                                                        +---------+---------------+---------+-----------+----------+--------------+ POP      Full  Yes      Yes                                 +---------+---------------+---------+-----------+----------+--------------+ PTV      Full                                                        +---------+---------------+---------+-----------+----------+--------------+ PERO     Full                                                        +---------+---------------+---------+-----------+----------+--------------+   +----+---------------+---------+-----------+----------+--------------+ LEFTCompressibilityPhasicitySpontaneityPropertiesThrombus Aging +----+---------------+---------+-----------+----------+--------------+ CFV Full           Yes      Yes                                 +----+---------------+---------+-----------+----------+--------------+     Summary: Right: There is no evidence of deep vein thrombosis in the lower  extremity. However, portions of this examination were limited- see technologist comments above. No cystic structure found in the popliteal fossa. Left: No evidence of common femoral vein obstruction.  *See table(s) above for measurements and observations.    Preliminary     Procedures Procedures (including critical care time)  Medications Ordered in ED Medications  iohexol (OMNIPAQUE) 350 MG/ML injection 50 mL (35 mLs Intravenous Contrast Given 12/26/18 0935)    ED Course  I have reviewed the triage vital signs and the nursing notes.  Pertinent labs & imaging results that were available during my care of the patient were reviewed by me and considered in my medical decision making (see chart for details).    MDM Rules/Calculators/A&P                      Julie Durham is a 78 y.o. female with a past medical history significant for hypertension, hyperlipidemia, mild CAD, Takotsubo cardiomyopathy, and recent right tibial fracture now immobilized who presents with chest pain or shortness of breath.  Patient reports that she was doing well yesterday before going to bed but then woke up early this morning with left-sided chest pressure, tightness, and shortness of breath.  She reports it is very pleuritic.  Is an 8.5 out of 10 in severity right now.  She reports no fevers but does report occasional chills.  She denies cough.  She denies diaphoresis, nausea, vomiting, or radiation of the pain.  She does report that her right leg has felt worse over the last day or 2.  It is in a knee immobilizer.  She denies any history of DVT or PE to her knowledge.  No recent other fevers or chills.  She denies any Covid contacts.  She denies other complaints.  No abdominal pain back pain that is new.  On exam, patient does have bilateral DP pulses.  Good sensation and strength in ankles bilaterally.  Patient's right leg is in knee immobilizer.  Abdomen is nontender.  Chest was nontender and I cannot reproduce  the discomfort fully.  She  did not have a murmur on my exam.  Lungs had some mild coarseness bilaterally.  No significant rales.  No wheezing.  Good upper extremity pulses.  Patient is alert and oriented.  Patient is tachycardic on arrival.  EKG shows no STEMI but does show sinus tachycardia.  Clinically I am concerned about DVT/PE given her recent leg injury with new immobility and the sudden onset chest pain shortness of breath with tachycardia.  She is not hypoxic at this time.  We will get imaging including chest x-ray and CT PE study.  We will get labs.  Patient does report she has had an MI in the past, previous cath shows that she had around 30% stenosis in the proximal to mid RCA.  It was described as nonobstructive disease in the cath report.  Anticipate reassessment of her her work-up.  Covid test will be checked.  Covid test was negative.  Delta troponin not elevated and negative improved from prior.  Lactic acid negative x2.  Other labs similar to prior.  CT PE study showed no pulmonary embolism and ultrasound showed no DVT.  Patient reports feeling better after learning about her reassuring work-up.  Clinically I have a low suspicion that her symptoms are due to a cardiac or pulmonary etiology.  I suspect this is more musculoskeletal or anxiety related.  Patient agrees that the stress of what it "could be" may have contributed to her symptoms.  She does agree with discharge home to follow-up with her cardiologist and PCP.  Patient had other questions or concerns and was discharged in good condition with improved symptoms and overall reassuring work-up.   Final Clinical Impression(s) / ED Diagnoses Final diagnoses:  Chest pain, unspecified type  Atypical chest pain    Rx / DC Orders ED Discharge Orders    None      Clinical Impression: 1. Chest pain, unspecified type   2. Atypical chest pain     Disposition: Discharge  Condition: Good  I have discussed the results, Dx  and Tx plan with the pt(& family if present). He/she/they expressed understanding and agree(s) with the plan. Discharge instructions discussed at great length. Strict return precautions discussed and pt &/or family have verbalized understanding of the instructions. No further questions at time of discharge.    New Prescriptions   No medications on file    Follow Up: Your PCP and your Cardiologist          Nona Gracey, Canary Brimhristopher J, MD 12/26/18 1251

## 2018-12-26 NOTE — ED Notes (Signed)
Patient transported to CT 

## 2018-12-26 NOTE — ED Notes (Signed)
Pt is NSR on monitor 

## 2018-12-26 NOTE — Discharge Instructions (Addendum)
Your work-up today was overall reassuring with no evidence of blood clot causing your symptoms.  Your heart enzymes were negative both times we checked them and were not changing.  Your other labs were overall reassuring.  You had a slightly elevated BNP but otherwise we did not find any other concerning findings requiring admission.  As your symptoms have slightly improved, we feel you are safe for discharge home.  Please follow-up with your cardiologist and PCP.  If any symptoms change or worsen, please return to the nearest emergency department.

## 2018-12-26 NOTE — ED Triage Notes (Signed)
Pt presents to the ED with left sided chest pain that patient reports started as muscle spasms in her leg that radiates into her left chest. Pt reports she is at [facility] for rehab due to a broken right leg. Pt has brace on right leg and has a chronic foley in place due to urine retention per patient.

## 2018-12-26 NOTE — ED Notes (Signed)
PTAR called @ 1307-per Alana, RN called by Levada Dy

## 2019-01-02 DIAGNOSIS — R5381 Other malaise: Secondary | ICD-10-CM | POA: Diagnosis not present

## 2019-01-02 DIAGNOSIS — S82201A Unspecified fracture of shaft of right tibia, initial encounter for closed fracture: Secondary | ICD-10-CM | POA: Diagnosis not present

## 2019-01-02 DIAGNOSIS — I10 Essential (primary) hypertension: Secondary | ICD-10-CM | POA: Diagnosis not present

## 2019-01-02 DIAGNOSIS — J45909 Unspecified asthma, uncomplicated: Secondary | ICD-10-CM | POA: Diagnosis not present

## 2019-01-03 ENCOUNTER — Emergency Department (HOSPITAL_COMMUNITY): Payer: Medicare Other

## 2019-01-03 ENCOUNTER — Inpatient Hospital Stay (HOSPITAL_COMMUNITY)
Admission: EM | Admit: 2019-01-03 | Discharge: 2019-01-09 | DRG: 070 | Disposition: A | Discharge status: Skilled Nursing Facility | Payer: Medicare Other | Attending: Internal Medicine | Admitting: Internal Medicine

## 2019-01-03 ENCOUNTER — Encounter (HOSPITAL_COMMUNITY): Payer: Self-pay

## 2019-01-03 ENCOUNTER — Other Ambulatory Visit: Payer: Self-pay

## 2019-01-03 DIAGNOSIS — I10 Essential (primary) hypertension: Secondary | ICD-10-CM | POA: Diagnosis present

## 2019-01-03 DIAGNOSIS — G9340 Encephalopathy, unspecified: Secondary | ICD-10-CM | POA: Diagnosis not present

## 2019-01-03 DIAGNOSIS — N390 Urinary tract infection, site not specified: Secondary | ICD-10-CM | POA: Diagnosis not present

## 2019-01-03 DIAGNOSIS — Z79899 Other long term (current) drug therapy: Secondary | ICD-10-CM | POA: Diagnosis not present

## 2019-01-03 DIAGNOSIS — S82201A Unspecified fracture of shaft of right tibia, initial encounter for closed fracture: Secondary | ICD-10-CM | POA: Diagnosis present

## 2019-01-03 DIAGNOSIS — F05 Delirium due to known physiological condition: Secondary | ICD-10-CM | POA: Diagnosis present

## 2019-01-03 DIAGNOSIS — Z7982 Long term (current) use of aspirin: Secondary | ICD-10-CM

## 2019-01-03 DIAGNOSIS — K5641 Fecal impaction: Secondary | ICD-10-CM | POA: Diagnosis not present

## 2019-01-03 DIAGNOSIS — E43 Unspecified severe protein-calorie malnutrition: Secondary | ICD-10-CM | POA: Diagnosis not present

## 2019-01-03 DIAGNOSIS — Z20828 Contact with and (suspected) exposure to other viral communicable diseases: Secondary | ICD-10-CM | POA: Diagnosis present

## 2019-01-03 DIAGNOSIS — Z820 Family history of epilepsy and other diseases of the nervous system: Secondary | ICD-10-CM

## 2019-01-03 DIAGNOSIS — Z79891 Long term (current) use of opiate analgesic: Secondary | ICD-10-CM | POA: Diagnosis not present

## 2019-01-03 DIAGNOSIS — Z9889 Other specified postprocedural states: Secondary | ICD-10-CM | POA: Diagnosis not present

## 2019-01-03 DIAGNOSIS — M4850XA Collapsed vertebra, not elsewhere classified, site unspecified, initial encounter for fracture: Secondary | ICD-10-CM | POA: Diagnosis present

## 2019-01-03 DIAGNOSIS — F0390 Unspecified dementia without behavioral disturbance: Secondary | ICD-10-CM | POA: Diagnosis present

## 2019-01-03 DIAGNOSIS — Z66 Do not resuscitate: Secondary | ICD-10-CM | POA: Diagnosis present

## 2019-01-03 DIAGNOSIS — R627 Adult failure to thrive: Secondary | ICD-10-CM | POA: Diagnosis not present

## 2019-01-03 DIAGNOSIS — S82101A Unspecified fracture of upper end of right tibia, initial encounter for closed fracture: Secondary | ICD-10-CM | POA: Diagnosis not present

## 2019-01-03 DIAGNOSIS — G9341 Metabolic encephalopathy: Secondary | ICD-10-CM

## 2019-01-03 DIAGNOSIS — S82101D Unspecified fracture of upper end of right tibia, subsequent encounter for closed fracture with routine healing: Secondary | ICD-10-CM | POA: Diagnosis not present

## 2019-01-03 DIAGNOSIS — E785 Hyperlipidemia, unspecified: Secondary | ICD-10-CM | POA: Diagnosis present

## 2019-01-03 DIAGNOSIS — R1084 Generalized abdominal pain: Secondary | ICD-10-CM | POA: Diagnosis not present

## 2019-01-03 DIAGNOSIS — I5181 Takotsubo syndrome: Secondary | ICD-10-CM | POA: Diagnosis not present

## 2019-01-03 DIAGNOSIS — Z743 Need for continuous supervision: Secondary | ICD-10-CM | POA: Diagnosis not present

## 2019-01-03 DIAGNOSIS — M4856XA Collapsed vertebra, not elsewhere classified, lumbar region, initial encounter for fracture: Secondary | ICD-10-CM | POA: Diagnosis not present

## 2019-01-03 DIAGNOSIS — I25119 Atherosclerotic heart disease of native coronary artery with unspecified angina pectoris: Secondary | ICD-10-CM | POA: Diagnosis present

## 2019-01-03 DIAGNOSIS — D649 Anemia, unspecified: Secondary | ICD-10-CM | POA: Diagnosis not present

## 2019-01-03 DIAGNOSIS — E162 Hypoglycemia, unspecified: Secondary | ICD-10-CM | POA: Diagnosis present

## 2019-01-03 DIAGNOSIS — Z8249 Family history of ischemic heart disease and other diseases of the circulatory system: Secondary | ICD-10-CM | POA: Diagnosis not present

## 2019-01-03 DIAGNOSIS — E876 Hypokalemia: Secondary | ICD-10-CM | POA: Diagnosis present

## 2019-01-03 DIAGNOSIS — S32000A Wedge compression fracture of unspecified lumbar vertebra, initial encounter for closed fracture: Secondary | ICD-10-CM | POA: Diagnosis present

## 2019-01-03 DIAGNOSIS — J45909 Unspecified asthma, uncomplicated: Secondary | ICD-10-CM | POA: Diagnosis not present

## 2019-01-03 DIAGNOSIS — N3000 Acute cystitis without hematuria: Secondary | ICD-10-CM | POA: Diagnosis not present

## 2019-01-03 DIAGNOSIS — R197 Diarrhea, unspecified: Secondary | ICD-10-CM | POA: Diagnosis not present

## 2019-01-03 HISTORY — DX: Encephalopathy, unspecified: G93.40

## 2019-01-03 LAB — URINALYSIS, ROUTINE W REFLEX MICROSCOPIC
Bilirubin Urine: NEGATIVE
Glucose, UA: NEGATIVE mg/dL
Ketones, ur: 20 mg/dL — AB
Nitrite: NEGATIVE
Protein, ur: 30 mg/dL — AB
RBC / HPF: >50 RBC/hpf — ABNORMAL HIGH (ref 0–5)
Specific Gravity, Urine: 1.021 (ref 1.005–1.030)
pH: 5 (ref 5.0–8.0)

## 2019-01-03 LAB — COMPREHENSIVE METABOLIC PANEL
ALT: 16 U/L (ref 0–44)
AST: 21 U/L (ref 15–41)
Albumin: 3.1 g/dL — ABNORMAL LOW (ref 3.5–5.0)
Alkaline Phosphatase: 190 U/L — ABNORMAL HIGH (ref 38–126)
Anion gap: 12 (ref 5–15)
BUN: 30 mg/dL — ABNORMAL HIGH (ref 8–23)
CO2: 28 mmol/L (ref 22–32)
Calcium: 9.3 mg/dL (ref 8.9–10.3)
Chloride: 102 mmol/L (ref 98–111)
Creatinine, Ser: 0.54 mg/dL (ref 0.44–1.00)
GFR calc Af Amer: >60 mL/min (ref >60)
GFR calc non Af Amer: >60 mL/min (ref >60)
Glucose, Bld: 77 mg/dL (ref 70–99)
Potassium: 3.3 mmol/L — ABNORMAL LOW (ref 3.5–5.1)
Sodium: 142 mmol/L (ref 135–145)
Total Bilirubin: 0.8 mg/dL (ref 0.3–1.2)
Total Protein: 6.1 g/dL — ABNORMAL LOW (ref 6.5–8.1)

## 2019-01-03 LAB — CBC WITH DIFFERENTIAL/PLATELET
Abs Immature Granulocytes: 0.03 10*3/uL (ref 0.00–0.07)
Basophils Absolute: 0 10*3/uL (ref 0.0–0.1)
Basophils Relative: 0 %
Eosinophils Absolute: 0 10*3/uL (ref 0.0–0.5)
Eosinophils Relative: 0 %
HCT: 35.6 % — ABNORMAL LOW (ref 36.0–46.0)
Hemoglobin: 11.6 g/dL — ABNORMAL LOW (ref 12.0–15.0)
Immature Granulocytes: 1 %
Lymphocytes Relative: 15 %
Lymphs Abs: 0.9 10*3/uL (ref 0.7–4.0)
MCH: 31.1 pg (ref 26.0–34.0)
MCHC: 32.6 g/dL (ref 30.0–36.0)
MCV: 95.4 fL (ref 80.0–100.0)
Monocytes Absolute: 0.4 10*3/uL (ref 0.1–1.0)
Monocytes Relative: 6 %
Neutro Abs: 4.7 10*3/uL (ref 1.7–7.7)
Neutrophils Relative %: 78 %
Platelets: 406 10*3/uL — ABNORMAL HIGH (ref 150–400)
RBC: 3.73 MIL/uL — ABNORMAL LOW (ref 3.87–5.11)
RDW: 12.1 % (ref 11.5–15.5)
WBC: 6 10*3/uL (ref 4.0–10.5)
nRBC: 0 % (ref 0.0–0.2)

## 2019-01-03 LAB — RESPIRATORY PANEL BY RT PCR (FLU A&B, COVID)
Influenza A by PCR: NEGATIVE
Influenza B by PCR: NEGATIVE
SARS Coronavirus 2 by RT PCR: NEGATIVE

## 2019-01-03 LAB — POC SARS CORONAVIRUS 2 AG -  ED: SARS Coronavirus 2 Ag: NEGATIVE

## 2019-01-03 MED ORDER — SODIUM CHLORIDE 0.9 % IV BOLUS
1000.0000 mL | Freq: Once | INTRAVENOUS | Status: AC
  Administered 2019-01-03: 1000 mL via INTRAVENOUS

## 2019-01-03 MED ORDER — MORPHINE SULFATE (PF) 2 MG/ML IV SOLN
2.0000 mg | Freq: Once | INTRAVENOUS | Status: AC
  Administered 2019-01-03: 2 mg via INTRAVENOUS
  Filled 2019-01-03: qty 1

## 2019-01-03 MED ORDER — SODIUM CHLORIDE 0.9 % IV SOLN
1.0000 g | Freq: Once | INTRAVENOUS | Status: AC
Start: 2019-01-03 — End: 2019-01-03
  Administered 2019-01-03: 1 g via INTRAVENOUS
  Filled 2019-01-03: qty 10

## 2019-01-03 MED ORDER — ONDANSETRON HCL 4 MG/2ML IJ SOLN
4.0000 mg | Freq: Once | INTRAMUSCULAR | Status: AC
  Administered 2019-01-03: 4 mg via INTRAVENOUS
  Filled 2019-01-03: qty 2

## 2019-01-03 NOTE — ED Triage Notes (Signed)
Pt arrives via [facility] from [place] c/o malaise and diarrhea x1 day. Pt is alert and oriented to baseline (PMHX of dementia). VS: BP 135/82, HR 102 (SR), SPO2 100% RA, RR 15, CBG 95. Pt denies pain at this time.

## 2019-01-03 NOTE — ED Provider Notes (Signed)
[facility] EMERGENCY DEPARTMENT Provider Note   CSN: [id number] Arrival date & time: 01/03/19  2019     History Chief Complaint  Patient presents with  . Weakness    Julie Durham is a 78 y.o. female.  Pt presents to the ED today with diarrhea and weakness.  The pt also has right knee pain.  Pt had a fall on 12/14 and had a right tibia fracture.  This has been treated nonoperatively and is in a knee immobilizer.  She denies any sob or cough.  Pt said she has been getting treatments in the basements of several hospitals.  She keeps going off on random tangents.        Past Medical History:  Diagnosis Date  . Allergy   . Anemia   . Arthritis   . Asthma   . Brain mass    a. 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  . Cataract   . Elevated troponin   . Encephalitis, viral    a. 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 Hyperlipidemia   . Hypertension   . Mild CAD per cath 10/2018   . Mild mitral regurgitation    a. Echo 2011: LVEF >55% w/ grade 1 DD, mild MR, mild TR, elevated RVSP 30-53mmHg  . Mild tricuspid regurgitation   . Palpitations   . Takotsubo cardiomyopathy     Patient Active Problem List   Diagnosis Date Noted  . Acute encephalopathy 01/03/2019  . Protein-calorie malnutrition, severe 12/24/2018  . Acute pain of right knee   . Closed right tibial fracture 12/23/2018  . Mild CAD 10/24/2018  . S/P kyphoplasty 10/24/2018  . Stress fracture of lumbar vertebra 10/24/2018  . Systolic dysfunction 38/25/0539  . Elevated troponin 10/24/2018  . Chest pain  10/24/2018  . Brain mass 10/24/2018  . Takotsubo syndrome   . Lumbar compression fracture ([place]) 10/22/2018  . Hyperlipidemia 02/28/2017  . Oropharyngeal dysphagia 10/04/2016  . Other bursal cyst, left  elbow 10/04/2016  . Pill esophagitis 10/04/2016  . Infected insect bite 06/29/2016  . Retained foreign body in soft tissue 06/29/2016  . Essential hypertension 11/18/2012  . Palpitations 11/18/2012    Past Surgical History:  Procedure Laterality Date  . APPENDECTOMY    . CHOLECYSTECTOMY    . DOPPLER ECHOCARDIOGRAPHY  2011  . EYE SURGERY    . KYPHOPLASTY Bilateral 10/22/2018   Procedure: KYPHOPLASTY LUMBAR ONE, LUMBAR FOUR;  Surgeon: Consuella Lose, MD;  Location: [place];  Service: Neurosurgery;  Laterality: Bilateral;  KYPHOPLASTY LUMBAR ONE, LUMBAR FOUR  . LEFT HEART CATH AND CORONARY ANGIOGRAPHY N/A 10/23/2018   Procedure: LEFT HEART CATH AND CORONARY ANGIOGRAPHY;  Surgeon: Sherren Mocha, MD;  Location: [place] [facility];  Service: Cardiovascular;  Laterality: N/A;  . TUBAL LIGATION       OB History   No obstetric history on file.     Family History  Problem Relation Age of Onset  . Alzheimer's disease Mother   . Heart attack Father   . Heart disease Brother   . Diabetes Son   . Sensorineural hearing loss Sister   . Heart disease Brother   . Heart disease Brother   . Thyroid disease Brother     Social History   Tobacco Use  . Smoking status: Never Smoker  . Smokeless  tobacco: Never Used  Substance Use Topics  . Alcohol use: Never  . Drug use: Never    Home Medications Prior to Admission medications   Medication Sig Start Date End Date Taking? Authorizing Provider  acetaminophen (TYLENOL) 500 MG tablet Take 500 mg by mouth daily as needed for mild pain.     [provider]  aspirin EC 81 MG tablet Take 81 mg by mouth daily.     [provider]  atorvastatin (LIPITOR) 10 MG tablet Take 1 tablet (10 mg total) by mouth daily at 6 PM. 10/24/18   Furth, Cadence H, PA-C  Calcium Carb-Cholecalciferol (CALCIUM + VITAMIN D3 PO) Take 1 tablet by mouth 2 (two) times daily.    [provider]  irbesartan (AVAPRO) 150 MG tablet Take 1 tablet  (150 mg total) by mouth daily. 10/21/18   Marjie Skiff E, PA-C  metoprolol tartrate (LOPRESSOR) 25 MG tablet TAKE 1/2 TABLET(12.5 MG) BY MOUTH TWICE DAILY Patient taking differently: Take 12.5 mg by mouth 2 (two) times daily.  11/19/18   Runell Gess, MD  Multiple Vitamins-Minerals (PRESERVISION AREDS 2) CAPS Take 1 capsule by mouth 2 (two) times daily.    [provider]  oxyCODONE (OXYCONTIN) 10 mg 12 hr tablet Take 1 tablet (10 mg total) by mouth every 12 (twelve) hours. 12/25/18   Rai, Delene Ruffini, MD    Allergies    Doxycycline hyclate, Shellfish allergy, Demerol, and Sulfa antibiotics  Review of Systems   Review of Systems  Gastrointestinal: Positive for diarrhea.  Musculoskeletal:       Right knee pain  All other systems reviewed and are negative.   Physical Exam Updated Vital Signs BP (!) 181/88   Pulse 100   Temp 98.4 F (36.9 C) (Oral)   Resp (!) 22   Ht 5\' 3"  (1.6 m)   Wt 32 kg   SpO2 98%   BMI 12.50 kg/m   Physical Exam Vitals and nursing note reviewed.  Constitutional:      Appearance: Normal appearance.  HENT:     Head: Normocephalic and atraumatic.     Right Ear: External ear normal.     Left Ear: External ear normal.     Nose: Nose normal.     Mouth/Throat:     Mouth: Mucous membranes are moist.     Pharynx: Oropharynx is clear.  Eyes:     Extraocular Movements: Extraocular movements intact.     Conjunctiva/sclera: Conjunctivae normal.     Pupils: Pupils are equal, round, and reactive to light.  Cardiovascular:     Rate and Rhythm: Normal rate and regular rhythm.     Pulses: Normal pulses.     Heart sounds: Normal heart sounds.  Pulmonary:     Effort: Pulmonary effort is normal.     Breath sounds: Normal breath sounds.  Abdominal:     General: Abdomen is flat. Bowel sounds are normal.     Palpations: Abdomen is soft.  Musculoskeletal:     Cervical back: Normal range of motion and neck supple.     Comments: Knee immobilizer  right knee  Skin:    General: Skin is warm.  Neurological:     Mental Status: She is alert.     Comments: Pt seems very confused.  No hx of dementia.  She was living at home prior to her fall.  Psychiatric:        Mood and Affect: Mood normal.  Behavior: Behavior normal.     ED Results / Procedures / Treatments   Labs (all labs ordered are listed, but only abnormal results are displayed) Labs Reviewed  COMPREHENSIVE METABOLIC PANEL - Abnormal; Notable for the following components:      Result Value   Potassium 3.3 (*)    BUN 30 (*)    Total Protein 6.1 (*)    Albumin 3.1 (*)    Alkaline Phosphatase 190 (*)    All other components within normal limits  CBC WITH DIFFERENTIAL/PLATELET - Abnormal; Notable for the following components:   RBC 3.73 (*)    Hemoglobin 11.6 (*)    HCT 35.6 (*)    Platelets 406 (*)    All other components within normal limits  URINALYSIS, ROUTINE W REFLEX MICROSCOPIC - Abnormal; Notable for the following components:   Color, Urine AMBER (*)    APPearance CLOUDY (*)    Hgb urine dipstick LARGE (*)    Ketones, ur 20 (*)    Protein, ur 30 (*)    Leukocytes,Ua MODERATE (*)    RBC / HPF >50 (*)    Bacteria, UA MANY (*)    All other components within normal limits  GI PATHOGEN PANEL BY PCR, STOOL  RESPIRATORY PANEL BY RT PCR (FLU A&B, COVID)  URINE CULTURE  POC SARS CORONAVIRUS 2 AG -  ED    EKG None  Radiology DG Chest Portable 1 View  Result Date: 01/03/2019 CLINICAL DATA:  Altered mental status EXAM: PORTABLE CHEST 1 VIEW COMPARISON:  CT 12/26/2018 radiograph 12/26/2010 FINDINGS: Hyperinflation with coarsened interstitial changes similar to prior. No new consolidative opacity is seen. Some bandlike areas of opacity in the left base favor atelectasis. No pneumothorax or effusion. Apical pleuroparenchymal scarring is similar to prior. The aorta is calcified. The remaining cardiomediastinal contours are unremarkable. The osseous structures  appear diffusely demineralized which may limit detection of small or nondisplaced fractures. Vertebroplasty changes noted in the spine. Multilevel compression deformities appear grossly similar prior though are poorly assessed in the absence of lateral radiograph. IMPRESSION: 1. No acute cardiopulmonary abnormality. 2. Stable chronic interstitial changes, hyperinflation and biapical pleuroparenchymal scarring. 3. Prior vertebroplasty multilevel compression deformities in the spine, poorly assessed in the absence of lateral view. Electronically Signed   By: Kreg Shropshire M.D.   On: 01/03/2019 21:19    Procedures Procedures (including critical care time)  Medications Ordered in ED Medications  sodium chloride 0.9 % bolus 1,000 mL (0 mLs Intravenous Stopped 01/03/19 2251)  morphine 2 MG/ML injection 2 mg (2 mg Intravenous Given 01/03/19 2148)  ondansetron (ZOFRAN) injection 4 mg (4 mg Intravenous Given 01/03/19 2147)  cefTRIAXone (ROCEPHIN) 1 g in sodium chloride 0.9 % 100 mL IVPB (1 g Intravenous New Bag/Given 01/03/19 2251)    ED Course  I have reviewed the triage vital signs and the nursing notes.  Pertinent labs & imaging results that were available during my care of the patient were reviewed by me and considered in my medical decision making (see chart for details).    MDM Rules/Calculators/A&P                      Pt does have a UTI.  She is treated with Rocephin.  Her initial POC covid test was negative.  PCR test pending.  Stool studies are pending.  Pt d/w Dr. Toniann Fail ([facility]) for admission.  CORIANA ANGELLO was evaluated in Emergency Department on 01/03/2019 for the symptoms described in  the history of present illness. She was evaluated in the context of the global COVID-19 pandemic, which necessitated consideration that the patient might be at risk for infection with the SARS-CoV-2 virus that causes COVID-19. Institutional protocols and algorithms that pertain to the  evaluation of patients at risk for COVID-19 are in a state of rapid change based on information released by regulatory bodies including the CDC and federal and state organizations. These policies and algorithms were followed during the patient's care in the ED.  Final Clinical Impression(s) / ED Diagnoses Final diagnoses:  Metabolic encephalopathy  Acute cystitis without hematuria  Diarrhea, unspecified type    Rx / DC Orders ED Discharge Orders    None       Jacalyn LefevreHaviland, Renette Hsu, MD 01/03/19 2325

## 2019-01-04 ENCOUNTER — Encounter (HOSPITAL_COMMUNITY): Payer: Self-pay | Admitting: Internal Medicine

## 2019-01-04 ENCOUNTER — Observation Stay (HOSPITAL_COMMUNITY): Payer: Medicare Other

## 2019-01-04 DIAGNOSIS — J45909 Unspecified asthma, uncomplicated: Secondary | ICD-10-CM | POA: Diagnosis present

## 2019-01-04 DIAGNOSIS — I25119 Atherosclerotic heart disease of native coronary artery with unspecified angina pectoris: Secondary | ICD-10-CM | POA: Diagnosis present

## 2019-01-04 DIAGNOSIS — E785 Hyperlipidemia, unspecified: Secondary | ICD-10-CM | POA: Diagnosis not present

## 2019-01-04 DIAGNOSIS — M4856XA Collapsed vertebra, not elsewhere classified, lumbar region, initial encounter for fracture: Secondary | ICD-10-CM | POA: Diagnosis not present

## 2019-01-04 DIAGNOSIS — E876 Hypokalemia: Secondary | ICD-10-CM | POA: Diagnosis present

## 2019-01-04 DIAGNOSIS — G9341 Metabolic encephalopathy: Secondary | ICD-10-CM | POA: Diagnosis not present

## 2019-01-04 DIAGNOSIS — D649 Anemia, unspecified: Secondary | ICD-10-CM | POA: Diagnosis not present

## 2019-01-04 DIAGNOSIS — N3000 Acute cystitis without hematuria: Secondary | ICD-10-CM | POA: Insufficient documentation

## 2019-01-04 DIAGNOSIS — E162 Hypoglycemia, unspecified: Secondary | ICD-10-CM | POA: Diagnosis present

## 2019-01-04 DIAGNOSIS — Z7982 Long term (current) use of aspirin: Secondary | ICD-10-CM | POA: Diagnosis not present

## 2019-01-04 DIAGNOSIS — I5181 Takotsubo syndrome: Secondary | ICD-10-CM | POA: Diagnosis present

## 2019-01-04 DIAGNOSIS — F0390 Unspecified dementia without behavioral disturbance: Secondary | ICD-10-CM | POA: Diagnosis present

## 2019-01-04 DIAGNOSIS — M2550 Pain in unspecified joint: Secondary | ICD-10-CM | POA: Diagnosis not present

## 2019-01-04 DIAGNOSIS — H25013 Cortical age-related cataract, bilateral: Secondary | ICD-10-CM | POA: Diagnosis not present

## 2019-01-04 DIAGNOSIS — R5381 Other malaise: Secondary | ICD-10-CM | POA: Diagnosis not present

## 2019-01-04 DIAGNOSIS — S82101A Unspecified fracture of upper end of right tibia, initial encounter for closed fracture: Secondary | ICD-10-CM | POA: Diagnosis not present

## 2019-01-04 DIAGNOSIS — G9340 Encephalopathy, unspecified: Secondary | ICD-10-CM | POA: Diagnosis not present

## 2019-01-04 DIAGNOSIS — R2689 Other abnormalities of gait and mobility: Secondary | ICD-10-CM | POA: Diagnosis not present

## 2019-01-04 DIAGNOSIS — Z79899 Other long term (current) drug therapy: Secondary | ICD-10-CM | POA: Diagnosis not present

## 2019-01-04 DIAGNOSIS — Z9889 Other specified postprocedural states: Secondary | ICD-10-CM | POA: Diagnosis not present

## 2019-01-04 DIAGNOSIS — I2510 Atherosclerotic heart disease of native coronary artery without angina pectoris: Secondary | ICD-10-CM | POA: Diagnosis not present

## 2019-01-04 DIAGNOSIS — Z820 Family history of epilepsy and other diseases of the nervous system: Secondary | ICD-10-CM | POA: Diagnosis not present

## 2019-01-04 DIAGNOSIS — Z79891 Long term (current) use of opiate analgesic: Secondary | ICD-10-CM | POA: Diagnosis not present

## 2019-01-04 DIAGNOSIS — E43 Unspecified severe protein-calorie malnutrition: Secondary | ICD-10-CM | POA: Diagnosis not present

## 2019-01-04 DIAGNOSIS — Z66 Do not resuscitate: Secondary | ICD-10-CM | POA: Diagnosis not present

## 2019-01-04 DIAGNOSIS — Z20828 Contact with and (suspected) exposure to other viral communicable diseases: Secondary | ICD-10-CM | POA: Diagnosis present

## 2019-01-04 DIAGNOSIS — M6281 Muscle weakness (generalized): Secondary | ICD-10-CM | POA: Diagnosis not present

## 2019-01-04 DIAGNOSIS — N390 Urinary tract infection, site not specified: Secondary | ICD-10-CM | POA: Diagnosis present

## 2019-01-04 DIAGNOSIS — R638 Other symptoms and signs concerning food and fluid intake: Secondary | ICD-10-CM | POA: Diagnosis not present

## 2019-01-04 DIAGNOSIS — I10 Essential (primary) hypertension: Secondary | ICD-10-CM | POA: Diagnosis not present

## 2019-01-04 DIAGNOSIS — M810 Age-related osteoporosis without current pathological fracture: Secondary | ICD-10-CM | POA: Diagnosis not present

## 2019-01-04 DIAGNOSIS — R197 Diarrhea, unspecified: Secondary | ICD-10-CM | POA: Diagnosis not present

## 2019-01-04 DIAGNOSIS — K5641 Fecal impaction: Secondary | ICD-10-CM | POA: Diagnosis present

## 2019-01-04 DIAGNOSIS — S82101D Unspecified fracture of upper end of right tibia, subsequent encounter for closed fracture with routine healing: Secondary | ICD-10-CM | POA: Diagnosis not present

## 2019-01-04 DIAGNOSIS — F05 Delirium due to known physiological condition: Secondary | ICD-10-CM | POA: Diagnosis present

## 2019-01-04 DIAGNOSIS — S82201D Unspecified fracture of shaft of right tibia, subsequent encounter for closed fracture with routine healing: Secondary | ICD-10-CM | POA: Diagnosis not present

## 2019-01-04 DIAGNOSIS — Z7401 Bed confinement status: Secondary | ICD-10-CM | POA: Diagnosis not present

## 2019-01-04 DIAGNOSIS — Z743 Need for continuous supervision: Secondary | ICD-10-CM | POA: Diagnosis not present

## 2019-01-04 DIAGNOSIS — R627 Adult failure to thrive: Secondary | ICD-10-CM | POA: Diagnosis present

## 2019-01-04 DIAGNOSIS — Z8249 Family history of ischemic heart disease and other diseases of the circulatory system: Secondary | ICD-10-CM | POA: Diagnosis not present

## 2019-01-04 DIAGNOSIS — Z741 Need for assistance with personal care: Secondary | ICD-10-CM | POA: Diagnosis not present

## 2019-01-04 HISTORY — DX: Urinary tract infection, site not specified: N39.0

## 2019-01-04 LAB — RETICULOCYTES
Immature Retic Fract: 13.1 % (ref 2.3–15.9)
RBC.: 3.53 MIL/uL — ABNORMAL LOW (ref 3.87–5.11)
Retic Count, Absolute: 67.1 10*3/uL (ref 19.0–186.0)
Retic Ct Pct: 1.9 % (ref 0.4–3.1)

## 2019-01-04 LAB — AMMONIA: Ammonia: 14 umol/L (ref 9–35)

## 2019-01-04 LAB — FERRITIN: Ferritin: 58 ng/mL (ref 11–307)

## 2019-01-04 LAB — IRON AND TIBC
Iron: 31 ug/dL (ref 28–170)
Saturation Ratios: 13 % (ref 10.4–31.8)
TIBC: 231 ug/dL — ABNORMAL LOW (ref 250–450)
UIBC: 200 ug/dL

## 2019-01-04 LAB — TSH: TSH: 2.121 u[IU]/mL (ref 0.350–4.500)

## 2019-01-04 LAB — FOLATE: Folate: 20 ng/mL (ref >5.9)

## 2019-01-04 LAB — RPR: RPR Ser Ql: NONREACTIVE

## 2019-01-04 LAB — VITAMIN B12: Vitamin B-12: 540 pg/mL (ref 180–914)

## 2019-01-04 LAB — MRSA PCR SCREENING: MRSA by PCR: NEGATIVE

## 2019-01-04 MED ORDER — ATORVASTATIN CALCIUM 10 MG PO TABS
10.0000 mg | ORAL_TABLET | Freq: Every day | ORAL | Status: DC
  Administered 2019-01-04 – 2019-01-08 (×5): 10 mg via ORAL
  Filled 2019-01-04 (×5): qty 1

## 2019-01-04 MED ORDER — PROSIGHT PO TABS
1.0000 | ORAL_TABLET | Freq: Two times a day (BID) | ORAL | Status: DC
  Administered 2019-01-04 – 2019-01-08 (×9): 1 via ORAL
  Filled 2019-01-04 (×9): qty 1

## 2019-01-04 MED ORDER — OXYCODONE HCL ER 10 MG PO T12A
10.0000 mg | EXTENDED_RELEASE_TABLET | Freq: Two times a day (BID) | ORAL | Status: DC
  Administered 2019-01-04 – 2019-01-05 (×3): 10 mg via ORAL
  Filled 2019-01-04 (×3): qty 1

## 2019-01-04 MED ORDER — ASPIRIN EC 81 MG PO TBEC
81.0000 mg | DELAYED_RELEASE_TABLET | Freq: Every day | ORAL | Status: DC
  Administered 2019-01-04 – 2019-01-08 (×5): 81 mg via ORAL
  Filled 2019-01-04 (×5): qty 1

## 2019-01-04 MED ORDER — ONDANSETRON HCL 4 MG/2ML IJ SOLN: 4.0000 mg | Freq: Four times a day (QID) | INTRAMUSCULAR | Status: DC | PRN

## 2019-01-04 MED ORDER — ACETAMINOPHEN 325 MG PO TABS: 650.0000 mg | ORAL_TABLET | Freq: Four times a day (QID) | ORAL | Status: DC | PRN

## 2019-01-04 MED ORDER — SODIUM CHLORIDE 0.9 % IV SOLN
INTRAVENOUS | Status: DC | PRN
  Administered 2019-01-04: 250 mL via INTRAVENOUS
  Administered 2019-01-05 – 2019-01-07 (×4): 1000 mL via INTRAVENOUS

## 2019-01-04 MED ORDER — ACETAMINOPHEN 650 MG RE SUPP: 650.0000 mg | Freq: Four times a day (QID) | RECTAL | Status: DC | PRN

## 2019-01-04 MED ORDER — IRBESARTAN 300 MG PO TABS
150.0000 mg | ORAL_TABLET | Freq: Every day | ORAL | Status: DC
  Administered 2019-01-04 – 2019-01-08 (×4): 150 mg via ORAL
  Filled 2019-01-04 (×5): qty 1

## 2019-01-04 MED ORDER — ENOXAPARIN SODIUM 300 MG/3ML IJ SOLN
15.0000 mg | INTRAMUSCULAR | Status: DC
  Administered 2019-01-04 – 2019-01-08 (×5): 15 mg via SUBCUTANEOUS
  Filled 2019-01-04 (×8): qty 0.15

## 2019-01-04 MED ORDER — HYDRALAZINE HCL 20 MG/ML IJ SOLN
10.0000 mg | INTRAMUSCULAR | Status: DC | PRN
  Administered 2019-01-07: 10 mg via INTRAVENOUS
  Filled 2019-01-04: qty 1

## 2019-01-04 MED ORDER — ONDANSETRON HCL 4 MG PO TABS: 4.0000 mg | ORAL_TABLET | Freq: Four times a day (QID) | ORAL | Status: DC | PRN

## 2019-01-04 MED ORDER — METOPROLOL TARTRATE 12.5 MG HALF TABLET
12.5000 mg | ORAL_TABLET | Freq: Two times a day (BID) | ORAL | Status: DC
  Administered 2019-01-04 – 2019-01-08 (×10): 12.5 mg via ORAL
  Filled 2019-01-04 (×10): qty 1

## 2019-01-04 MED ORDER — SODIUM CHLORIDE 0.9 % IV SOLN
1.0000 g | INTRAVENOUS | Status: DC
  Administered 2019-01-04: 1 g via INTRAVENOUS
  Filled 2019-01-04: qty 10

## 2019-01-04 NOTE — ED Notes (Signed)
Attempted report to 3E, unavailable.

## 2019-01-04 NOTE — Progress Notes (Signed)
PROGRESS NOTE    Julie BelfastBarbara E Durham  RUE:[id number]:[id number] DOB: 18-Feb-1940 [name]: 01/03/2019 PCP: Patient, No Pcp Per   Brief Narrative:  HPI On 01/04/2019 by Dr. Midge MiniumArshad Kakrakandy Julie BelfastBarbara E Durham is a 78 y.o. female with history of recently diagnosed cardiomyopathy likely from Takotsubo after patient had lumbar surgery in October with EF of 45% and readmitted in December and discharged about 10 days ago after being conservatively treated for tibial fracture was brought to the ER the patient was feeling weak and had an episode of diarrhea.  Patient is not a good historian and appears to be a little bit confused.  Patient otherwise denies any chest pain shortness of breath nausea vomiting. Assessment & Plan   Patient admitted earlier today by Dr. Toniann FailKakrakandy. See H&P for details.  Acute metabolic encephalopathy possibly secondary to UTI versus medications -unknown baseline -CT head unremarkable but does show chronic atrophy -Question if patient has underlying dementia given her answers to some of the questions, however may be due to urinary tract infection -Influenza A and B, Covid 19 negative -UA: Many bacteria, 21-50 WBC, moderate leukocytes -Urine culture pending -B12 and folate within normal limits, TSH 2.121 -Continue ceftriaxone  Diarrhea -GI pathogen panel pending  Essential hypertension -Continue irbesartan and metoprolol  Recent diagnosis of Takotsubo's cardiomyopathy -Continue losartan and metoprolol -Patient appears to be stable, euvolemic  Recent right tibial fracture and kyphoplasty -Patient on medication, will continue to monitor closely for mental status changes -Continue knee immobilizer-patient to follow-up with orthopedics, Dr. Antony OdeaLucio  Chronic anemia -Anemia panel showed adequate iron and storage.  B12 540, folate 20 Hypokalemia -Potassium 3.3, may be secondary to GI losses from diarrhea -We will replace and continue to monitor BMP  DVT Prophylaxis Lovenox  Code  Status: Full  Family Communication: None at bedside.  Disposition Plan: Currently in observation.  Given that patient is still confused with an unknown baseline, will continue to monitor patient and treat urinary tract infection.  Feel that patient will need additional midnights in the hospital given her ongoing confusion.  Consultants None  Procedures  None  Antibiotics   Anti-infectives (From admission, onward)   Start     Dose/Rate Route Frequency Ordered Stop   01/04/19 2200  cefTRIAXone (ROCEPHIN) 1 g in sodium chloride 0.9 % 100 mL IVPB     1 g 200 mL/hr over 30 Minutes Intravenous Every 24 hours 01/04/19 0542     01/03/19 2245  cefTRIAXone (ROCEPHIN) 1 g in sodium chloride 0.9 % 100 mL IVPB     1 g 200 mL/hr over 30 Minutes Intravenous  Once 01/03/19 2239 01/03/19 2343      Subjective:   Julie Durham seen and examined today. Appears confused. Denies current chest pain, shortness of breath, abdominal pain, N/V/C. States she has diarrhea but it is slowing down, but she does not know when it is coming.   Objective:   Vitals:   01/04/19 0430 01/04/19 0615 01/04/19 0705 01/04/19 1003  BP: (!) 171/108 (!) 159/79 (!) 147/77 (!) 142/72  Pulse: (!) 107 89 88 84  Resp: 17 17 16    Temp:   (!) 96.9 F (36.1 C)   TempSrc:   Oral   SpO2: 98% 98% 100%   Weight:   29 kg   Height:   4\' 11"  (1.499 m)     Intake/Output Summary (Last 24 hours) at 01/04/2019 1018 Last data filed at 01/04/2019 0800 Gross per 24 hour  Intake 1342.32 ml  Output 400 ml  Net 942.32 ml   Filed Weights   01/03/19 2028 01/04/19 0705  Weight: 32 kg 29 kg    Exam Admitted earlier today, see H&P   Data Reviewed: I have personally reviewed following labs and imaging studies  CBC: Recent Labs  Lab 01/03/19 2128  WBC 6.0  NEUTROABS 4.7  HGB 11.6*  HCT 35.6*  MCV 95.4  PLT 406*   Basic Metabolic Panel: Recent Labs  Lab 01/03/19 2128  NA 142  K 3.3*  CL 102  CO2 28  GLUCOSE 77   BUN 30*  CREATININE 0.54  CALCIUM 9.3   GFR: Estimated Creatinine Clearance: 26.1 mL/min (by C-G formula based on SCr of 0.54 mg/dL). Liver Function Tests: Recent Labs  Lab 01/03/19 2128  AST 21  ALT 16  ALKPHOS 190*  BILITOT 0.8  PROT 6.1*  ALBUMIN 3.1*   No results for input(s): LIPASE, AMYLASE in the last 168 hours. Recent Labs  Lab 01/04/19 0726  AMMONIA 14   Coagulation Profile: No results for input(s): INR, PROTIME in the last 168 hours. Cardiac Enzymes: No results for input(s): CKTOTAL, CKMB, CKMBINDEX, TROPONINI in the last 168 hours. BNP (last 3 results) No results for input(s): PROBNP in the last 8760 hours. HbA1C: No results for input(s): HGBA1C in the last 72 hours. CBG: No results for input(s): GLUCAP in the last 168 hours. Lipid Profile: No results for input(s): CHOL, HDL, LDLCALC, TRIG, CHOLHDL, LDLDIRECT in the last 72 hours. Thyroid Function Tests: Recent Labs    01/04/19 0726  TSH 2.121   Anemia Panel: Recent Labs    01/04/19 0726  VITAMINB12 540  FOLATE 20.0  FERRITIN 58  TIBC 231*  IRON 31  RETICCTPCT 1.9   Urine analysis:    Component Value Date/Time   COLORURINE AMBER (A) 01/03/2019 2211   APPEARANCEUR CLOUDY (A) 01/03/2019 2211   LABSPEC 1.021 01/03/2019 2211   PHURINE 5.0 01/03/2019 2211   GLUCOSEU NEGATIVE 01/03/2019 2211   HGBUR LARGE (A) 01/03/2019 2211   BILIRUBINUR NEGATIVE 01/03/2019 2211   KETONESUR 20 (A) 01/03/2019 2211   PROTEINUR 30 (A) 01/03/2019 2211   NITRITE NEGATIVE 01/03/2019 2211   LEUKOCYTESUR MODERATE (A) 01/03/2019 2211   Sepsis Labs: @LABRCNTIP (procalcitonin:4,lacticidven:4)  ) Recent Results (from the past 240 hour(s))  Respiratory Panel by RT PCR (Flu A&B, Covid) - Nasopharyngeal Swab     Status: None   Collection Time: 01/03/19 10:22 PM   Specimen: Nasopharyngeal Swab  Result Value Ref Range Status   SARS Coronavirus 2 by RT PCR NEGATIVE NEGATIVE Final    Comment: (NOTE) SARS-CoV-2 target  nucleic acids are NOT DETECTED. The SARS-CoV-2 RNA is generally detectable in upper respiratoy specimens during the acute phase of infection. The lowest concentration of SARS-CoV-2 viral copies this assay can detect is 131 copies/mL. A negative result does not preclude SARS-Cov-2 infection and should not be used as the sole basis for treatment or other patient management decisions. A negative result may occur with  improper specimen collection/handling, submission of specimen other than nasopharyngeal swab, presence of viral mutation(s) within the areas targeted by this assay, and inadequate number of viral copies (<131 copies/mL). A negative result must be combined with clinical observations, patient history, and epidemiological information. The expected result is Negative. Fact Sheet for Patients:  01/05/19 Fact Sheet for Healthcare Providers:  https://www.moore.com/ This test is not yet ap proved or cleared by the https://www.young.biz/ FDA and  has been authorized for detection and/or diagnosis of SARS-CoV-2 by FDA under  an Emergency Use Authorization (EUA). This EUA will remain  in effect (meaning this test can be used) for the duration of the COVID-19 declaration under Section 564(b)(1) of the Act, 21 U.S.C. section 360bbb-3(b)(1), unless the authorization is terminated or revoked sooner.    Influenza A by PCR NEGATIVE NEGATIVE Final   Influenza B by PCR NEGATIVE NEGATIVE Final    Comment: (NOTE) The Xpert Xpress SARS-CoV-2/FLU/RSV assay is intended as an aid in  the diagnosis of influenza from Nasopharyngeal swab specimens and  should not be used as a sole basis for treatment. Nasal washings and  aspirates are unacceptable for Xpert Xpress SARS-CoV-2/FLU/RSV  testing. Fact Sheet for Patients: https://www.moore.com/ Fact Sheet for Healthcare Providers: https://www.young.biz/ This test is not  yet approved or cleared by the [place] FDA and  has been authorized for detection and/or diagnosis of SARS-CoV-2 by  FDA under an Emergency Use Authorization (EUA). This EUA will remain  in effect (meaning this test can be used) for the duration of the  Covid-19 declaration under Section 564(b)(1) of the Act, 21  U.S.C. section 360bbb-3(b)(1), unless the authorization is  terminated or revoked. Performed at [facility], [address] [place], [place] [address]       Radiology Studies: CT HEAD WO CONTRAST  Result Date: 01/04/2019 CLINICAL DATA:  Encephalopathy EXAM: CT HEAD WITHOUT CONTRAST TECHNIQUE: Contiguous axial images were obtained from the base of the skull through the vertex without intravenous contrast. COMPARISON:  None. FINDINGS: Brain: There is no mass, hemorrhage or extra-axial collection. There is generalized atrophy without lobar predilection. Hypodensity of the white matter is most commonly associated with chronic microvascular disease. Vascular: No abnormal hyperdensity of the major intracranial arteries or dural venous sinuses. No intracranial atherosclerosis. Skull: The visualized skull base, calvarium and extracranial soft tissues are normal. Sinuses/Orbits: No fluid levels or advanced mucosal thickening of the visualized paranasal sinuses. No mastoid or middle ear effusion. The orbits are normal. IMPRESSION: Generalized atrophy and chronic microvascular ischemia without acute intracranial abnormality. Electronically Signed   By: Deatra Robinson M.D.   On: 01/04/2019 03:46   DG Chest Portable 1 View  Result Date: 01/03/2019 CLINICAL DATA:  Altered mental status EXAM: PORTABLE CHEST 1 VIEW COMPARISON:  CT 12/26/2018 radiograph 12/26/2010 FINDINGS: Hyperinflation with coarsened interstitial changes similar to prior. No new consolidative opacity is seen. Some bandlike areas of opacity in the left base favor atelectasis. No pneumothorax or effusion. Apical  pleuroparenchymal scarring is similar to prior. The aorta is calcified. The remaining cardiomediastinal contours are unremarkable. The osseous structures appear diffusely demineralized which may limit detection of small or nondisplaced fractures. Vertebroplasty changes noted in the spine. Multilevel compression deformities appear grossly similar prior though are poorly assessed in the absence of lateral radiograph. IMPRESSION: 1. No acute cardiopulmonary abnormality. 2. Stable chronic interstitial changes, hyperinflation and biapical pleuroparenchymal scarring. 3. Prior vertebroplasty multilevel compression deformities in the spine, poorly assessed in the absence of lateral view. Electronically Signed   By: Kreg Shropshire M.D.   On: 01/03/2019 21:19     Scheduled Meds: . aspirin EC  81 mg Oral Daily  . atorvastatin  10 mg Oral q1800  . enoxaparin (LOVENOX) injection  15 mg Subcutaneous Q24H  . irbesartan  150 mg Oral Daily  . metoprolol tartrate  12.5 mg Oral BID  . multivitamin  1 tablet Oral BID  . oxyCODONE  10 mg Oral Q12H   Continuous Infusions: . cefTRIAXone (ROCEPHIN)  IV  LOS: 0 days   Time Spent in minutes   45 minutes  Yancarlos Berthold D.O. on 01/04/2019 at 10:18 AM  Between 7am to 7pm - Please see pager noted on [email or web address]  After 7pm go to [email or web address]  And look for the night coverage person covering for me after hours  [facility] Office  [phone]

## 2019-01-04 NOTE — Progress Notes (Signed)
Pt was unable to void post Foley catheter removal.  Bladder scan at 1215 was 127 ml  and at 131 at 1520.  Notified Dr. Ree Kida, DO.  Provider made aware, advised to continue to monitor output.  Re-scan at 1830 was 141 ml.  Encouraged pt to drink water and void.

## 2019-01-04 NOTE — Progress Notes (Signed)
Patient arrived to unit, patient alert and oriented x 3.  Foley catheter in place.

## 2019-01-04 NOTE — Progress Notes (Signed)
Per verbal order by Dr. Ree Kida, DO discontinue Foley catheter. Peri care performed on patient. Patient's urinary output/ ability to void will be monitored.

## 2019-01-04 NOTE — H&P (Addendum)
History and Physical    Julie Durham NOB:[id number] DOB: January 03, 1941 [name]: 01/03/2019  PCP: Patient, No Pcp Per  Patient coming from: From skilled nursing facility.  Chief Complaint: Weakness and diarrhea.  HPI: Julie Durham is a 78 y.o. female with history of recently diagnosed cardiomyopathy likely from Takotsubo after patient had lumbar surgery in October with EF of 45% and readmitted in December and discharged about 10 days ago after being conservatively treated for tibial fracture was brought to the ER the patient was feeling weak and had an episode of diarrhea.  Patient is not a good historian and appears to be a little bit confused.  Patient otherwise denies any chest pain shortness of breath nausea vomiting.  ED Course: In the ER patient was afebrile but appears to be mildly confused which as per the previous records were not.  CT head is unremarkable UA is consistent with UTI.  Labs show creatinine 1.5 potassium 3.3 hemoglobin 9.6 platelets 406 Covid test was negative.  Patient admitted for further observation of acute encephalopathy could be from UTI also could be from pain with medications.  No further episodes of diarrhea in the ER.  Review of Systems: As per HPI, rest all negative.   Past Medical History:  Diagnosis Date  . Allergy   . Anemia   . Arthritis   . Asthma   . Brain mass    a. 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  . Cataract   . Elevated troponin   . Encephalitis, viral    a. 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 Hyperlipidemia   . Hypertension   . Mild CAD per cath 10/2018   . Mild mitral regurgitation    a. Echo 2011: LVEF >55% w/ grade 1 DD, mild MR, mild TR, elevated RVSP 30-69mmHg  . Mild tricuspid regurgitation   . Palpitations   . Takotsubo cardiomyopathy      Past Surgical History:  Procedure Laterality Date  . APPENDECTOMY    . CHOLECYSTECTOMY    . DOPPLER ECHOCARDIOGRAPHY  2011  . EYE SURGERY    . KYPHOPLASTY Bilateral 10/22/2018   Procedure: KYPHOPLASTY LUMBAR ONE, LUMBAR FOUR;  Surgeon: Consuella Lose, MD;  Location: [facility];  Service: Neurosurgery;  Laterality: Bilateral;  KYPHOPLASTY LUMBAR ONE, LUMBAR FOUR  . LEFT HEART CATH AND CORONARY ANGIOGRAPHY N/A 10/23/2018   Procedure: LEFT HEART CATH AND CORONARY ANGIOGRAPHY;  Surgeon: Sherren Mocha, MD;  Location: [facility];  Service: Cardiovascular;  Laterality: N/A;  . TUBAL LIGATION       reports that she has never smoked. She has never used smokeless tobacco. She reports that she does not drink alcohol or use drugs.  Allergies  Allergen Reactions  . Doxycycline Hyclate Anaphylaxis    Chest pain  . Shellfish Allergy Anaphylaxis  . Demerol Other (See Comments)    Sedation  . Sulfa Antibiotics Other (See Comments)    Unknown rxn    Family History  Problem Relation Age of Onset  . Alzheimer's disease Mother   . Heart attack Father   . Heart disease Brother   . Diabetes Son   . Sensorineural hearing loss Sister   . Heart disease Brother   . Heart disease Brother   . Thyroid disease Brother     Prior to Admission medications  Medication Sig Start Date End Date Taking? Authorizing Provider  acetaminophen (TYLENOL) 500 MG tablet Take 500 mg by mouth daily as needed for mild pain.     [provider]  aspirin EC 81 MG tablet Take 81 mg by mouth daily.     [provider]  atorvastatin (LIPITOR) 10 MG tablet Take 1 tablet (10 mg total) by mouth daily at 6 PM. 10/24/18   Furth, Cadence H, PA-C  Calcium Carb-Cholecalciferol (CALCIUM + VITAMIN D3 PO) Take 1 tablet by mouth 2 (two) times daily.    [provider]  irbesartan (AVAPRO) 150 MG tablet Take 1 tablet (150 mg total) by mouth daily. 10/21/18   Marjie SkiffGoodrich, Callie E, PA-C  metoprolol  tartrate (LOPRESSOR) 25 MG tablet TAKE 1/2 TABLET(12.5 MG) BY MOUTH TWICE DAILY Patient taking differently: Take 12.5 mg by mouth 2 (two) times daily.  11/19/18   Runell GessBerry, Jonathan J, MD  Multiple Vitamins-Minerals (PRESERVISION AREDS 2) CAPS Take 1 capsule by mouth 2 (two) times daily.    [provider]  oxyCODONE (OXYCONTIN) 10 mg 12 hr tablet Take 1 tablet (10 mg total) by mouth every 12 (twelve) hours. 12/25/18   Cathren Harshai, Ripudeep K, MD    Physical Exam: Constitutional: Moderately built and nourished. Vitals:   01/03/19 2345 01/03/19 2345 01/04/19 0145 01/04/19 0430  BP: (!) 151/79 (!) 153/86 (!) 147/74 (!) 171/108  Pulse: 88 87 84 (!) 107  Resp: 15 17 15 17   Temp:      TempSrc:      SpO2: 96% 98% 95% 98%  Weight:      Height:       Eyes: Anicteric no pallor. ENMT: No discharge from the ears eyes nose or mouth. Neck: No mass felt.  No neck rigidity. Respiratory: No rhonchi or crepitations. Cardiovascular: S1-S2 heard. Abdomen: Soft nontender bowel sounds present. Musculoskeletal: Right leg is in brace. Skin: No rash. Neurologic: Alert awake oriented to her name.  Appears confused but moves all extremities. Psychiatric: Appears confused.   Labs on Admission: I have personally reviewed following labs and imaging studies  CBC: Recent Labs  Lab 01/03/19 2128  WBC 6.0  NEUTROABS 4.7  HGB 11.6*  HCT 35.6*  MCV 95.4  PLT 406*   Basic Metabolic Panel: Recent Labs  Lab 01/03/19 2128  NA 142  K 3.3*  CL 102  CO2 28  GLUCOSE 77  BUN 30*  CREATININE 0.54  CALCIUM 9.3   GFR: Estimated Creatinine Clearance: 28.8 mL/min (by C-G formula based on SCr of 0.54 mg/dL). Liver Function Tests: Recent Labs  Lab 01/03/19 2128  AST 21  ALT 16  ALKPHOS 190*  BILITOT 0.8  PROT 6.1*  ALBUMIN 3.1*   No results for input(s): LIPASE, AMYLASE in the last 168 hours. No results for input(s): AMMONIA in the last 168 hours. Coagulation Profile: No results for input(s):  INR, PROTIME in the last 168 hours. Cardiac Enzymes: No results for input(s): CKTOTAL, CKMB, CKMBINDEX, TROPONINI in the last 168 hours. BNP (last 3 results) No results for input(s): PROBNP in the last 8760 hours. HbA1C: No results for input(s): HGBA1C in the last 72 hours. CBG: No results for input(s): GLUCAP in the last 168 hours. Lipid Profile: No results for input(s): CHOL, HDL, LDLCALC, TRIG, CHOLHDL, LDLDIRECT in the last 72 hours. Thyroid Function Tests: No results for input(s): TSH, T4TOTAL, FREET4, T3FREE, THYROIDAB in the last 72 hours. Anemia Panel: No results for input(s): VITAMINB12, FOLATE, FERRITIN, TIBC, IRON, RETICCTPCT in the  last 72 hours. Urine analysis:    Component Value Date/Time   COLORURINE AMBER (A) 01/03/2019 2211   APPEARANCEUR CLOUDY (A) 01/03/2019 2211   LABSPEC 1.021 01/03/2019 2211   PHURINE 5.0 01/03/2019 2211   GLUCOSEU NEGATIVE 01/03/2019 2211   HGBUR LARGE (A) 01/03/2019 2211   BILIRUBINUR NEGATIVE 01/03/2019 2211   KETONESUR 20 (A) 01/03/2019 2211   PROTEINUR 30 (A) 01/03/2019 2211   NITRITE NEGATIVE 01/03/2019 2211   LEUKOCYTESUR MODERATE (A) 01/03/2019 2211   Sepsis Labs: (procalcitonin:4,lacticidven:4) ) Recent Results (from the past 240 hour(s))  Respiratory Panel by RT PCR (Flu A&B, Covid) - Nasopharyngeal Swab     Status: None   Collection Time: 01/03/19 10:22 PM   Specimen: Nasopharyngeal Swab  Result Value Ref Range Status   SARS Coronavirus 2 by RT PCR NEGATIVE NEGATIVE Final    Comment: (NOTE) SARS-CoV-2 target nucleic acids are NOT DETECTED. The SARS-CoV-2 RNA is generally detectable in upper respiratoy specimens during the acute phase of infection. The lowest concentration of SARS-CoV-2 viral copies this assay can detect is 131 copies/mL. A negative result does not preclude SARS-Cov-2 infection and should not be used as the sole basis for treatment or other patient management decisions. A negative result may  occur with  improper specimen collection/handling, submission of specimen other than nasopharyngeal swab, presence of viral mutation(s) within the areas targeted by this assay, and inadequate number of viral copies (<131 copies/mL). A negative result must be combined with clinical observations, patient history, and epidemiological information. The expected result is Negative. Fact Sheet for Patients:  https://www.moore.com/ Fact Sheet for Healthcare Providers:  https://www.young.biz/ This test is not yet ap proved or cleared by the [place] FDA and  has been authorized for detection and/or diagnosis of SARS-CoV-2 by FDA under an Emergency Use Authorization (EUA). This EUA will remain  in effect (meaning this test can be used) for the duration of the COVID-19 declaration under Section 564(b)(1) of the Act, 21 U.S.C. section 360bbb-3(b)(1), unless the authorization is terminated or revoked sooner.    Influenza A by PCR NEGATIVE NEGATIVE Final   Influenza B by PCR NEGATIVE NEGATIVE Final    Comment: (NOTE) The Xpert Xpress SARS-CoV-2/FLU/RSV assay is intended as an aid in  the diagnosis of influenza from Nasopharyngeal swab specimens and  should not be used as a sole basis for treatment. Nasal washings and  aspirates are unacceptable for Xpert Xpress SARS-CoV-2/FLU/RSV  testing. Fact Sheet for Patients: https://www.moore.com/ Fact Sheet for Healthcare Providers: https://www.young.biz/ This test is not yet approved or cleared by the [place] FDA and  has been authorized for detection and/or diagnosis of SARS-CoV-2 by  FDA under an Emergency Use Authorization (EUA). This EUA will remain  in effect (meaning this test can be used) for the duration of the  Covid-19 declaration under Section 564(b)(1) of the Act, 21  U.S.C. section 360bbb-3(b)(1), unless the authorization is  terminated or  revoked. Performed at [facility], [address] [place], [place] [address]      Radiological Exams on Admission: CT HEAD WO CONTRAST  Result Date: 01/04/2019 CLINICAL DATA:  Encephalopathy EXAM: CT HEAD WITHOUT CONTRAST TECHNIQUE: Contiguous axial images were obtained from the base of the skull through the vertex without intravenous contrast. COMPARISON:  None. FINDINGS: Brain: There is no mass, hemorrhage or extra-axial collection. There is generalized atrophy without lobar predilection. Hypodensity of the white matter is most commonly associated with chronic microvascular disease. Vascular: No abnormal hyperdensity of the major  intracranial arteries or dural venous sinuses. No intracranial atherosclerosis. Skull: The visualized skull base, calvarium and extracranial soft tissues are normal. Sinuses/Orbits: No fluid levels or advanced mucosal thickening of the visualized paranasal sinuses. No mastoid or middle ear effusion. The orbits are normal. IMPRESSION: Generalized atrophy and chronic microvascular ischemia without acute intracranial abnormality. Electronically Signed   By: Deatra Robinson M.D.   On: 01/04/2019 03:46   DG Chest Portable 1 View  Result Date: 01/03/2019 CLINICAL DATA:  Altered mental status EXAM: PORTABLE CHEST 1 VIEW COMPARISON:  CT 12/26/2018 radiograph 12/26/2010 FINDINGS: Hyperinflation with coarsened interstitial changes similar to prior. No new consolidative opacity is seen. Some bandlike areas of opacity in the left base favor atelectasis. No pneumothorax or effusion. Apical pleuroparenchymal scarring is similar to prior. The aorta is calcified. The remaining cardiomediastinal contours are unremarkable. The osseous structures appear diffusely demineralized which may limit detection of small or nondisplaced fractures. Vertebroplasty changes noted in the spine. Multilevel compression deformities appear grossly similar prior though are poorly assessed in the absence of  lateral radiograph. IMPRESSION: 1. No acute cardiopulmonary abnormality. 2. Stable chronic interstitial changes, hyperinflation and biapical pleuroparenchymal scarring. 3. Prior vertebroplasty multilevel compression deformities in the spine, poorly assessed in the absence of lateral view. Electronically Signed   By: Kreg Shropshire M.D.   On: 01/03/2019 21:19    Assessment/Plan Principal Problem:   Acute encephalopathy Active Problems:   Essential hypertension   Lumbar compression fracture ([name])   Takotsubo syndrome   S/P kyphoplasty   Closed right tibial fracture   Acute lower UTI    1. Acute encephalopathy could be from UTI or could be from being on the medication.  CT head is unremarkable but does show chronic atrophy for which we may need to work-up for dementia eventually.  Closely monitor.  Follow urine cultures.  On ceftriaxone.  Patient has anemia will check B12 folate level in addition will check ammonia TSH and RPR. 2. Hypertension and recent diagnosis of Takotsubo's cardiomyopathy on ARB and beta-blockers.  Appears compensated. 3. Recent right tibial fracture and kyphoplasty on pain medication closely monitor for any worsening mental status changes. 4. Anemia appears to be chronic follow CBC.  Since patient has some confusion will check anemia panel specifically for B12 folate and also will check TSH and RPR.   DVT prophylaxis: Lovenox. Code Status: Full code. Family Communication: We will need to discuss with family when available. Disposition Plan: Back to nursing facility when stable. Consults called: None. Admission status: Observation.   Eduard Clos MD [facility] Pager [phone].  If 7PM-7AM, please contact night-coverage [email or web address] Password [facility]  01/04/2019, 5:42 AM

## 2019-01-05 DIAGNOSIS — N390 Urinary tract infection, site not specified: Secondary | ICD-10-CM

## 2019-01-05 DIAGNOSIS — I10 Essential (primary) hypertension: Secondary | ICD-10-CM

## 2019-01-05 DIAGNOSIS — I5181 Takotsubo syndrome: Secondary | ICD-10-CM

## 2019-01-05 DIAGNOSIS — Z9889 Other specified postprocedural states: Secondary | ICD-10-CM

## 2019-01-05 DIAGNOSIS — S82101A Unspecified fracture of upper end of right tibia, initial encounter for closed fracture: Secondary | ICD-10-CM

## 2019-01-05 LAB — CBC
HCT: 32.2 % — ABNORMAL LOW (ref 36.0–46.0)
Hemoglobin: 10.7 g/dL — ABNORMAL LOW (ref 12.0–15.0)
MCH: 30.8 pg (ref 26.0–34.0)
MCHC: 33.2 g/dL (ref 30.0–36.0)
MCV: 92.8 fL (ref 80.0–100.0)
Platelets: 386 10*3/uL (ref 150–400)
RBC: 3.47 MIL/uL — ABNORMAL LOW (ref 3.87–5.11)
RDW: 11.9 % (ref 11.5–15.5)
WBC: 5.6 10*3/uL (ref 4.0–10.5)
nRBC: 0 % (ref 0.0–0.2)

## 2019-01-05 LAB — BASIC METABOLIC PANEL
Anion gap: 12 (ref 5–15)
BUN: 31 mg/dL — ABNORMAL HIGH (ref 8–23)
CO2: 25 mmol/L (ref 22–32)
Calcium: 8.9 mg/dL (ref 8.9–10.3)
Chloride: 102 mmol/L (ref 98–111)
Creatinine, Ser: 0.74 mg/dL (ref 0.44–1.00)
GFR calc Af Amer: >60 mL/min (ref >60)
GFR calc non Af Amer: >60 mL/min (ref >60)
Glucose, Bld: 46 mg/dL — ABNORMAL LOW (ref 70–99)
Potassium: 3.5 mmol/L (ref 3.5–5.1)
Sodium: 139 mmol/L (ref 135–145)

## 2019-01-05 LAB — GLUCOSE, CAPILLARY
Glucose-Capillary: 169 mg/dL — ABNORMAL HIGH (ref 70–99)
Glucose-Capillary: 35 mg/dL — CL (ref 70–99)
Glucose-Capillary: 227 mg/dL — ABNORMAL HIGH (ref 70–99)
Glucose-Capillary: 113 mg/dL — ABNORMAL HIGH (ref 70–99)
Glucose-Capillary: 112 mg/dL — ABNORMAL HIGH (ref 70–99)

## 2019-01-05 MED ORDER — DEXTROSE 50 % IV SOLN
1.0000 | Freq: Once | INTRAVENOUS | Status: AC
  Administered 2019-01-05: 50 mL via INTRAVENOUS
  Filled 2019-01-05: qty 50

## 2019-01-05 MED ORDER — QUETIAPINE FUMARATE 25 MG PO TABS
12.5000 mg | ORAL_TABLET | Freq: Every day | ORAL | Status: DC
  Administered 2019-01-05 – 2019-01-08 (×4): 12.5 mg via ORAL
  Filled 2019-01-05 (×4): qty 1

## 2019-01-05 MED ORDER — OXYCODONE HCL 5 MG PO TABS
10.0000 mg | ORAL_TABLET | Freq: Two times a day (BID) | ORAL | Status: DC | PRN
  Administered 2019-01-07 (×2): 10 mg via ORAL
  Filled 2019-01-05 (×2): qty 2

## 2019-01-05 MED ORDER — SODIUM CHLORIDE 0.9 % IV SOLN
1.5000 g | Freq: Three times a day (TID) | INTRAVENOUS | Status: DC
  Administered 2019-01-05 – 2019-01-08 (×11): 1.5 g via INTRAVENOUS
  Filled 2019-01-05 (×13): qty 4

## 2019-01-05 MED ORDER — AMOXICILLIN 500 MG PO CAPS
500.0000 mg | ORAL_CAPSULE | Freq: Two times a day (BID) | ORAL | Status: DC
  Filled 2019-01-05: qty 1

## 2019-01-05 MED ORDER — OXYCODONE HCL ER 10 MG PO T12A: 10.0000 mg | EXTENDED_RELEASE_TABLET | Freq: Two times a day (BID) | ORAL | Status: DC | PRN

## 2019-01-05 MED ORDER — CHLORHEXIDINE GLUCONATE CLOTH 2 % EX PADS
6.0000 | MEDICATED_PAD | Freq: Every day | CUTANEOUS | Status: DC
  Administered 2019-01-06 – 2019-01-08 (×3): 6 via TOPICAL

## 2019-01-05 MED ORDER — POTASSIUM CHLORIDE CRYS ER 20 MEQ PO TBCR
40.0000 meq | EXTENDED_RELEASE_TABLET | Freq: Once | ORAL | Status: AC
  Administered 2019-01-05: 40 meq via ORAL
  Filled 2019-01-05: qty 2

## 2019-01-05 NOTE — Progress Notes (Signed)
Pt refused to take po amoxicillin,  Stating, please leave me alone I want to die in peace. Notified provider Dr. Ree Kida, DO.

## 2019-01-05 NOTE — Progress Notes (Signed)
Update on patient. Bladder scan 283mL, I&O cath 279mL. This was after patient drank 1 full cups of water and then a full cup of tea. Provider notified. Will continue to monitor.

## 2019-01-05 NOTE — Progress Notes (Signed)
Foley catheter placed 34fr.

## 2019-01-05 NOTE — Progress Notes (Signed)
Pt had hard stool impaction this am.  Able to removed the majority of the stool in rectum manually, but some still present.  Notified provider, Dr. Ree Kida, DO.  Provider placed order for tap water enema.  Pt also had a low blood sugar this am 46, 1 ampule of Dextrose 50 % given CBG 164 after. Per Dr. Ree Kida, pt blood sugar checks ACHS for admission.

## 2019-01-05 NOTE — Progress Notes (Signed)
Pharmacy Antibiotic Note  Julie Durham is a 78 y.o. female admitted on 01/03/2019 with UTI.  Pharmacy has been consulted for Unasyn dosing. Pt experiencing AMS. Originally placed on PO amoxicillin but patient refused. WBC is wnls and afebrile.  Plan: Give Unasyn 1.5g q8 hours Monitor renal fxn Follow sensitivities, clinical status, WBC, temp  Height: 4\' 11"  (149.9 cm) Weight: 83 lb (37.6 kg)(pt. was transfed to new bed (low bed) [name], NT) IBW/kg (Calculated) : 43.2  Temp (24hrs), Avg:98.1 F (36.7 C), Min:97.7 F (36.5 C), Max:98.6 F (37 C)  Recent Labs  Lab 01/03/19 2128 01/05/19 0419  WBC 6.0 5.6  CREATININE 0.54 0.74    Estimated Creatinine Clearance: 33.8 mL/min (by C-G formula based on SCr of 0.74 mg/dL).    Allergies  Allergen Reactions  . Doxycycline Hyclate Anaphylaxis and Other (See Comments)    Chest pain  . Shellfish Allergy Anaphylaxis  . Demerol Other (See Comments)    Sedation  . Sulfa Antibiotics Other (See Comments)    Unknown rxn    Antimicrobials this admission: Ceftriaxone 12/26 >>12/27 Unasyn 12/27>>   Microbiology results: 12/25 UCx: Enterococcus Faecalis 12/26 MRSA PCR: Negative  12/25 COVID: Negative  Thank you for allowing pharmacy to be a part of this patient's care.  Sherren Kerns, PharmD PGY1 Acute Care Pharmacy Resident 01/05/2019 1:30 PM

## 2019-01-05 NOTE — Plan of Care (Signed)
?  Problem: Urinary Elimination: ?Goal: Signs and symptoms of infection will decrease ?Outcome: Progressing ?  ?Problem: Education: ?Goal: Knowledge of General Education information will improve ?Description: Including pain rating scale, medication(s)/side effects and non-pharmacologic comfort measures ?Outcome: Progressing ?  ?

## 2019-01-05 NOTE — Plan of Care (Signed)
  Problem: Clinical Measurements: Goal: Will remain free from infection Outcome: Progressing   Problem: Activity: Goal: Risk for activity intolerance will decrease Outcome: Progressing   Problem: Nutrition: Goal: Adequate nutrition will be maintained Outcome: Progressing   Problem: Pain Managment: Goal: General experience of comfort will improve Outcome: Progressing   Problem: Skin Integrity: Goal: Risk for impaired skin integrity will decrease Outcome: Progressing   

## 2019-01-05 NOTE — Progress Notes (Signed)
Patient refused to have Foley catheter placed.  Pt also refused to have tap water enema. Pt stated just leave me alone, get away from me.  Notified Dr. Ree Kida, DO.

## 2019-01-05 NOTE — Progress Notes (Addendum)
PROGRESS NOTE    Julie Durham  GEX:[id number] DOB: [birth year] [name]: 01/03/2019 PCP: Patient, No Pcp Per   Brief Narrative:  HPI On 01/04/2019 by Dr. Midge Minium Julie Durham is a 78 y.o. female with history of recently diagnosed cardiomyopathy likely from Takotsubo after patient had lumbar surgery in October with EF of 45% and readmitted in December and discharged about 10 days ago after being conservatively treated for tibial fracture was brought to the ER the patient was feeling weak and had an episode of diarrhea.  Patient is not a good historian and appears to be a little bit confused.  Patient otherwise denies any chest pain shortness of breath nausea vomiting.  Interim history Patient admitted with acute metabolic encephalopathy thought to be secondary to UTI versus medications.  Question of patient has underlying dementia.  We will continue to treat reversible causes and monitor.  Also pending therapy. Assessment & Plan   Acute metabolic encephalopathy possibly secondary to UTI versus medications -unknown baseline -CT head unremarkable but does show chronic atrophy -Question if patient has underlying dementia given her answers to some of the questions, however may be due to urinary tract infection -Influenza A and B, Covid 19 negative -UA: Many bacteria, 21-50 WBC, moderate leukocytes -Urine culture pending -B12 and folate within normal limits, TSH 2.121 -Continue ceftriaxone  Diarrhea -GI pathogen panel pending  Essential hypertension -Continue irbesartan and metoprolol  Recent diagnosis of Takotsubo's cardiomyopathy -Continue losartan and metoprolol -Patient appears to be stable, euvolemic  Recent right tibial fracture and kyphoplasty -Patient on medication, will continue to monitor closely for mental status changes -Continue knee immobilizer-patient to follow-up with orthopedics, Dr. Lequita Halt  Chronic anemia -Anemia panel showed adequate iron and  storage.  B12 540, folate 20 -hemoglobin currently stable, 10.7 -monitor CBC  Hypokalemia -mildly improved with repletion, Potassium 3.5 -may be secondary to GI losses from diarrhea -will supplement and monitor BMP  Constipation -received a call from RN stating patient now with fecal impaction -will order tap water enema and monitor   Hypoglycemia -no history of diabetes - likely secondary to poor oral intake -blood sugar noted to be 35 this morning -will check CBGs -amp of D50 given -continue to monitor -encourage feeding -avoiding IVF given cardiomyopathy  DVT Prophylaxis Lovenox  Code Status: Full  Family Communication: None at bedside.  Disposition Plan: Admitted.  Patient still has some confusion, unknown baseline.  We will continue to treat urinary tract infection, pending culture results.  We will also consult PT and OT.  Patient does state that she lives alone, with a cat.  Consultants None  Procedures  None  Antibiotics   Anti-infectives (From admission, onward)   Start     Dose/Rate Route Frequency Ordered Stop   01/04/19 2200  cefTRIAXone (ROCEPHIN) 1 g in sodium chloride 0.9 % 100 mL IVPB     1 g 200 mL/hr over 30 Minutes Intravenous Every 24 hours 01/04/19 0542     01/03/19 2245  cefTRIAXone (ROCEPHIN) 1 g in sodium chloride 0.9 % 100 mL IVPB     1 g 200 mL/hr over 30 Minutes Intravenous  Once 01/03/19 2239 01/03/19 2343      Subjective:   Julie Durham seen and examined today.  Denies current chest pain, shortness of breath, abdominal pain, nausea or vomiting, constipation, dizziness or headache.  States diarrhea has improved and has been ongoing for quite some time.  Objective:   Vitals:   01/05/19 0158 01/05/19 0419 01/05/19 0102  01/05/19 0830  BP: (!) 157/81 (!) 158/70  132/75  Pulse: 80 68  (!) 105  Resp: 20 18  20   Temp: 98.2 F (36.8 C) 97.9 F (36.6 C)    TempSrc: Oral Oral    SpO2: 97% 96%  97%  Weight:   37.6 kg   Height:         Intake/Output Summary (Last 24 hours) at 01/05/2019 0920 Last data filed at 01/05/2019 0306 Gross per 24 hour  Intake 586.71 ml  Output 250 ml  Net 336.71 ml   Filed Weights   01/03/19 2028 01/04/19 0705 01/05/19 0516  Weight: 32 kg 29 kg 37.6 kg    Exam  General: Well developed, thin, elderly, NAD  HEENT: NCAT, mucous membranes moist.   Cardiovascular: S1 S2 auscultated, RRR  Respiratory: Clear to auscultation bilaterally   Abdomen: Soft, nontender, nondistended, + bowel sounds  Extremities: warm dry without cyanosis clubbing or edema  Neuro: AAOx3 (self, time, current events, however not place or situation), nonfocal  Psych: Appropriate and pleasant    Data Reviewed: I have personally reviewed following labs and imaging studies  CBC: Recent Labs  Lab 01/03/19 2128 01/05/19 0419  WBC 6.0 5.6  NEUTROABS 4.7  --   HGB 11.6* 10.7*  HCT 35.6* 32.2*  MCV 95.4 92.8  PLT 406* 386   Basic Metabolic Panel: Recent Labs  Lab 01/03/19 2128 01/05/19 0419  NA 142 139  K 3.3* 3.5  CL 102 102  CO2 28 25  GLUCOSE 77 46*  BUN 30* 31*  CREATININE 0.54 0.74  CALCIUM 9.3 8.9   GFR: Estimated Creatinine Clearance: 33.8 mL/min (by C-G formula based on SCr of 0.74 mg/dL). Liver Function Tests: Recent Labs  Lab 01/03/19 2128  AST 21  ALT 16  ALKPHOS 190*  BILITOT 0.8  PROT 6.1*  ALBUMIN 3.1*   No results for input(s): LIPASE, AMYLASE in the last 168 hours. Recent Labs  Lab 01/04/19 0726  AMMONIA 14   Coagulation Profile: No results for input(s): INR, PROTIME in the last 168 hours. Cardiac Enzymes: No results for input(s): CKTOTAL, CKMB, CKMBINDEX, TROPONINI in the last 168 hours. BNP (last 3 results) No results for input(s): PROBNP in the last 8760 hours. HbA1C: No results for input(s): HGBA1C in the last 72 hours. CBG: Recent Labs  Lab 01/05/19 0734 01/05/19 0756  GLUCAP 35* 169*   Lipid Profile: No results for input(s): CHOL, HDL, LDLCALC,  TRIG, CHOLHDL, LDLDIRECT in the last 72 hours. Thyroid Function Tests: Recent Labs    01/04/19 0726  TSH 2.121   Anemia Panel: Recent Labs    01/04/19 0726  VITAMINB12 540  FOLATE 20.0  FERRITIN 58  TIBC 231*  IRON 31  RETICCTPCT 1.9   Urine analysis:    Component Value Date/Time   COLORURINE AMBER (A) 01/03/2019 2211   APPEARANCEUR CLOUDY (A) 01/03/2019 2211   LABSPEC 1.021 01/03/2019 2211   PHURINE 5.0 01/03/2019 2211   GLUCOSEU NEGATIVE 01/03/2019 2211   HGBUR LARGE (A) 01/03/2019 2211   BILIRUBINUR NEGATIVE 01/03/2019 2211   KETONESUR 20 (A) 01/03/2019 2211   PROTEINUR 30 (A) 01/03/2019 2211   NITRITE NEGATIVE 01/03/2019 2211   LEUKOCYTESUR MODERATE (A) 01/03/2019 2211   Sepsis Labs: @LABRCNTIP (procalcitonin:4,lacticidven:4)  ) Recent Results (from the past 240 hour(s))  Respiratory Panel by RT PCR (Flu A&B, Covid) - Nasopharyngeal Swab     Status: None   Collection Time: 01/03/19 10:22 PM   Specimen: Nasopharyngeal Swab  Result Value  Ref Range Status   SARS Coronavirus 2 by RT PCR NEGATIVE NEGATIVE Final    Comment: (NOTE) SARS-CoV-2 target nucleic acids are NOT DETECTED. The SARS-CoV-2 RNA is generally detectable in upper respiratoy specimens during the acute phase of infection. The lowest concentration of SARS-CoV-2 viral copies this assay can detect is 131 copies/mL. A negative result does not preclude SARS-Cov-2 infection and should not be used as the sole basis for treatment or other patient management decisions. A negative result may occur with  improper specimen collection/handling, submission of specimen other than nasopharyngeal swab, presence of viral mutation(s) within the areas targeted by this assay, and inadequate number of viral copies (<131 copies/mL). A negative result must be combined with clinical observations, patient history, and epidemiological information. The expected result is Negative. Fact Sheet for Patients:   https://www.moore.com/ Fact Sheet for Healthcare Providers:  https://www.young.biz/ This test is not yet ap proved or cleared by the [place] FDA and  has been authorized for detection and/or diagnosis of SARS-CoV-2 by FDA under an Emergency Use Authorization (EUA). This EUA will remain  in effect (meaning this test can be used) for the duration of the COVID-19 declaration under Section 564(b)(1) of the Act, 21 U.S.C. section 360bbb-3(b)(1), unless the authorization is terminated or revoked sooner.    Influenza A by PCR NEGATIVE NEGATIVE Final   Influenza B by PCR NEGATIVE NEGATIVE Final    Comment: (NOTE) The Xpert Xpress SARS-CoV-2/FLU/RSV assay is intended as an aid in  the diagnosis of influenza from Nasopharyngeal swab specimens and  should not be used as a sole basis for treatment. Nasal washings and  aspirates are unacceptable for Xpert Xpress SARS-CoV-2/FLU/RSV  testing. Fact Sheet for Patients: https://www.moore.com/ Fact Sheet for Healthcare Providers: https://www.young.biz/ This test is not yet approved or cleared by the [place] FDA and  has been authorized for detection and/or diagnosis of SARS-CoV-2 by  FDA under an Emergency Use Authorization (EUA). This EUA will remain  in effect (meaning this test can be used) for the duration of the  Covid-19 declaration under Section 564(b)(1) of the Act, 21  U.S.C. section 360bbb-3(b)(1), unless the authorization is  terminated or revoked. Performed at [facility], [address], [place], [place] [address]   Urine culture     Status: None (Preliminary result)   Collection Time: 01/03/19 10:40 PM   Specimen: Urine, Random  Result Value Ref Range Status   Specimen Description URINE, RANDOM  Final   Special Requests NONE  Final   Culture   Final    CULTURE REINCUBATED FOR BETTER GROWTH Performed at [facility], [address] [place], [place] [address]    Report Status PENDING  Incomplete  MRSA PCR Screening     Status: None   Collection Time: 01/04/19  2:55 PM   Specimen: Nasal Mucosa; Nasopharyngeal  Result Value Ref Range Status   MRSA by PCR NEGATIVE NEGATIVE Final    Comment:        The GeneXpert MRSA Assay (FDA approved for NASAL specimens only), is one component of a comprehensive MRSA colonization surveillance program. It is not intended to diagnose MRSA infection nor to guide or monitor treatment for MRSA infections. Performed at [facility], [address], [place], [place] [address]       Radiology Studies: CT HEAD WO CONTRAST  Result Date: 01/04/2019 CLINICAL DATA:  Encephalopathy EXAM: CT HEAD WITHOUT CONTRAST TECHNIQUE: Contiguous axial images were obtained from the base  of the skull through the vertex without intravenous contrast. COMPARISON:  None. FINDINGS: Brain: There is no mass, hemorrhage or extra-axial collection. There is generalized atrophy without lobar predilection. Hypodensity of the white matter is most commonly associated with chronic microvascular disease. Vascular: No abnormal hyperdensity of the major intracranial arteries or dural venous sinuses. No intracranial atherosclerosis. Skull: The visualized skull base, calvarium and extracranial soft tissues are normal. Sinuses/Orbits: No fluid levels or advanced mucosal thickening of the visualized paranasal sinuses. No mastoid or middle ear effusion. The orbits are normal. IMPRESSION: Generalized atrophy and chronic microvascular ischemia without acute intracranial abnormality. Electronically Signed   By: Ulyses Jarred M.D.   On: 01/04/2019 03:46   DG Chest Portable 1 View  Result Date: 01/03/2019 CLINICAL DATA:  Altered mental status EXAM: PORTABLE CHEST 1 VIEW COMPARISON:  CT 12/26/2018 radiograph 12/26/2010 FINDINGS: Hyperinflation with coarsened interstitial changes similar to prior. No new consolidative opacity is  seen. Some bandlike areas of opacity in the left base favor atelectasis. No pneumothorax or effusion. Apical pleuroparenchymal scarring is similar to prior. The aorta is calcified. The remaining cardiomediastinal contours are unremarkable. The osseous structures appear diffusely demineralized which may limit detection of small or nondisplaced fractures. Vertebroplasty changes noted in the spine. Multilevel compression deformities appear grossly similar prior though are poorly assessed in the absence of lateral radiograph. IMPRESSION: 1. No acute cardiopulmonary abnormality. 2. Stable chronic interstitial changes, hyperinflation and biapical pleuroparenchymal scarring. 3. Prior vertebroplasty multilevel compression deformities in the spine, poorly assessed in the absence of lateral view. Electronically Signed   By: Lovena Le M.D.   On: 01/03/2019 21:19     Scheduled Meds: . aspirin EC  81 mg Oral Daily  . atorvastatin  10 mg Oral q1800  . enoxaparin (LOVENOX) injection  15 mg Subcutaneous Q24H  . irbesartan  150 mg Oral Daily  . metoprolol tartrate  12.5 mg Oral BID  . multivitamin  1 tablet Oral BID  . oxyCODONE  10 mg Oral Q12H   Continuous Infusions: . sodium chloride Stopped (01/04/19 2241)  . cefTRIAXone (ROCEPHIN)  IV Stopped (01/04/19 2202)     LOS: 1 day   Time Spent in minutes   45 minutes  Atley Neubert D.O. on 01/05/2019 at 9:20 AM  Between 7am to 7pm - Please see pager noted on [email or web address]  After 7pm go to [email or web address]  And look for the night coverage person covering for me after hours  [facility] Office  [phone]

## 2019-01-06 LAB — BASIC METABOLIC PANEL
Anion gap: 9 (ref 5–15)
BUN: 19 mg/dL (ref 8–23)
CO2: 29 mmol/L (ref 22–32)
Calcium: 8.8 mg/dL — ABNORMAL LOW (ref 8.9–10.3)
Chloride: 102 mmol/L (ref 98–111)
Creatinine, Ser: 0.53 mg/dL (ref 0.44–1.00)
GFR calc Af Amer: >60 mL/min (ref >60)
GFR calc non Af Amer: >60 mL/min (ref >60)
Glucose, Bld: 100 mg/dL — ABNORMAL HIGH (ref 70–99)
Potassium: 3.4 mmol/L — ABNORMAL LOW (ref 3.5–5.1)
Sodium: 140 mmol/L (ref 135–145)

## 2019-01-06 LAB — GI PATHOGEN PANEL BY PCR, STOOL

## 2019-01-06 LAB — URINE CULTURE: Culture: >=100000 — AB

## 2019-01-06 LAB — MAGNESIUM: Magnesium: 2 mg/dL (ref 1.7–2.4)

## 2019-01-06 LAB — GLUCOSE, CAPILLARY
Glucose-Capillary: 90 mg/dL (ref 70–99)
Glucose-Capillary: 125 mg/dL — ABNORMAL HIGH (ref 70–99)
Glucose-Capillary: 92 mg/dL (ref 70–99)
Glucose-Capillary: 75 mg/dL (ref 70–99)

## 2019-01-06 LAB — HEMOGLOBIN AND HEMATOCRIT, BLOOD
HCT: 31.8 % — ABNORMAL LOW (ref 36.0–46.0)
Hemoglobin: 10.5 g/dL — ABNORMAL LOW (ref 12.0–15.0)

## 2019-01-06 MED ORDER — POTASSIUM CHLORIDE CRYS ER 20 MEQ PO TBCR
40.0000 meq | EXTENDED_RELEASE_TABLET | Freq: Once | ORAL | Status: AC
  Administered 2019-01-06: 40 meq via ORAL
  Filled 2019-01-06: qty 2

## 2019-01-06 MED ORDER — LORAZEPAM 2 MG/ML IJ SOLN
0.5000 mg | Freq: Once | INTRAMUSCULAR | Status: AC
  Administered 2019-01-06: 0.5 mg via INTRAVENOUS
  Filled 2019-01-06: qty 1

## 2019-01-06 NOTE — Progress Notes (Signed)
Tap water enema order complete.  Pt had several output of separate hard lumps.   Bed alarm on, call bell within reach, and floor mats in place.

## 2019-01-06 NOTE — Evaluation (Signed)
Physical Therapy Evaluation Patient Details Name: Julie Durham MRN: [id number] DOB: 03/27/1940 Today's Date: 01/06/2019   History of Present Illness  Patient is a 78 y/o female who presents with diarrhea and weakness. Admitted with acute encephalopathy secondary to UTI vs medications. Question underlying dementia? Recent tibial fx per notes, wearing KI. PMH includes dementia, anemia, brain mass, encephalitis, takotsubo cardiomyopathy, HTN, HLD, kyphoplasty L1, L4 10/2018.  Clinical Impression  Patient presents with generalized weakness, decreased AROM BLEs, impaired cognition, impaired balance and impaired mobility s/p above. Pt not a great historian and not able to state PLOF. Came from [facility]. Today, pt requires Mod-Max A of 1-2 for bed mobility and transfers. Limited knee flexion bilaterally likely partly contracted. Pt oriented to self only. Easily distracted and impaired attention. Would benefit from return to SNF to maximize independence and mobility prior to return home. Will follow acutely.    Follow Up Recommendations SNF;Supervision/Assistance - 24 hour(return to [facility])    Equipment Recommendations  Other (comment)(defer)    Recommendations for Other Services       Precautions / Restrictions Precautions Precautions: Fall Required Braces or Orthoses: Knee Immobilizer - Right Knee Immobilizer - Right: On at all times Restrictions Weight Bearing Restrictions: Yes RLE Weight Bearing: Weight bearing as tolerated Other Position/Activity Restrictions: per orders 1 week ago      Mobility  Bed Mobility Overal bed mobility: Needs Assistance Bed Mobility: Supine to Sit     Supine to sit: Max assist;HOB elevated     General bed mobility comments: Assist needed with LEs and to elevate trunk with max cues for sequencing.  Transfers Overall transfer level: Needs assistance Equipment used: Rolling walker (2 wheeled) Transfers: Sit to/from Stand Sit to  Stand: Mod assist;+2 physical assistance Stand pivot transfers: Mod assist;+2 physical assistance       General transfer comment: Assist to power to standing with cues for technique, posterior lean through hips and narrow BoS. Stood from EOB x1, from [facility] x1, SPT bed to/from bSC with MOd A for RW management and balance.  Ambulation/Gait Ambulation/Gait assistance: Mod assist;+2 physical assistance Gait Distance (Feet): 3 Feet(2) Assistive device: Rolling walker (2 wheeled) Gait Pattern/deviations: Narrow base of support;Leaning posteriorly;Shuffle Gait velocity: decreased   General Gait Details: Able to take a few steps with assist for RW management, balance and moving RLE. POsterior lean.  Stairs            Wheelchair Mobility    Modified Rankin (Stroke Patients Only)       Balance Overall balance assessment: Needs assistance;History of Falls Sitting-balance support: Single extremity supported;Feet supported Sitting balance-Leahy Scale: Fair Sitting balance - Comments: Close min guard for safety.   Standing balance support: During functional activity Standing balance-Leahy Scale: Poor Standing balance comment: heavy reliance on external support and use of RW, posterior lean                             Pertinent Vitals/Pain Pain Assessment: Faces Faces Pain Scale: Hurts little more Pain Location: bil feet Pain Descriptors / Indicators: Burning Pain Intervention(s): Monitored during session;Repositioned    Home Living Family/patient expects to be discharged to:: Skilled nursing facility                      Prior Function Level of Independence: Needs assistance         Comments: Pt poor historian and not able to state  PLOF. Likely total A for all ADLs, possibly transferring with assist.     Hand Dominance   Dominant Hand: Right    Extremity/Trunk Assessment   Upper Extremity Assessment Upper Extremity Assessment: Generalized  weakness    Lower Extremity Assessment Lower Extremity Assessment: Defer to PT evaluation    Cervical / Trunk Assessment Cervical / Trunk Assessment: Kyphotic  Communication   Communication: HOH  Cognition Arousal/Alertness: Awake/alert Behavior During Therapy: WFL for tasks assessed/performed Overall Cognitive Status: Impaired/Different from baseline Area of Impairment: Orientation;Attention;Memory;Following commands;Safety/judgement;Problem solving                 Orientation Level: Disoriented to;Situation;Time;Place Current Attention Level: Focused Memory: Decreased short-term memory Following Commands: Follows one step commands with increased time;Follows one step commands inconsistently Safety/Judgement: Decreased awareness of safety;Decreased awareness of deficits   Problem Solving: Slow processing;Decreased initiation;Difficulty sequencing;Requires verbal cues;Requires tactile cues General Comments: Pt very confused, talking about things unrelated to questions asked. Poor attention. Easily distracted.      General Comments      Exercises     Assessment/Plan    PT Assessment Patient needs continued PT services  PT Problem List Decreased mobility;Decreased strength;Decreased range of motion;Decreased activity tolerance;Decreased balance;Decreased knowledge of use of DME;Pain;Decreased safety awareness;Decreased cognition       PT Treatment Interventions Therapeutic activities;DME instruction;Gait training;Therapeutic exercise;Patient/family education;Balance training;Functional mobility training;Cognitive remediation;Wheelchair mobility training    PT Goals (Current goals can be found in the Care Plan section)  Acute Rehab PT Goals Patient Stated Goal: not able to state PT Goal Formulation: Patient unable to participate in goal setting Time For Goal Achievement: 01/20/19 Potential to Achieve Goals: Good    Frequency Min 2X/week   Barriers to discharge         Co-evaluation   Reason for Co-Treatment: For patient/therapist safety;To address functional/ADL transfers;Necessary to address cognition/behavior during functional activity   OT goals addressed during session: ADL's and self-care       AM-PAC PT "6 Clicks" Mobility  Outcome Measure Help needed turning from your back to your side while in a flat bed without using bedrails?: A Lot Help needed moving from lying on your back to sitting on the side of a flat bed without using bedrails?: Total Help needed moving to and from a bed to a chair (including a wheelchair)?: A Lot Help needed standing up from a chair using your arms (e.g., wheelchair or bedside chair)?: A Lot Help needed to walk in hospital room?: Total Help needed climbing 3-5 steps with a railing? : Total 6 Click Score: 9    End of Session Equipment Utilized During Treatment: Gait belt;Right knee immobilizer Activity Tolerance: Patient tolerated treatment well Patient left: in bed;with call bell/phone within reach;with bed alarm set Nurse Communication: Mobility status PT Visit Diagnosis: Other abnormalities of gait and mobility (R26.89);Difficulty in walking, not elsewhere classified (R26.2)    Time: 0865-7846 PT Time Calculation (min) (ACUTE ONLY): 23 min   Charges:   PT Evaluation $PT Eval Moderate Complexity: 1 Mod          Marisa Severin, PT, DPT Acute Rehabilitation Services Pager [phone] Office [phone]      Julie Durham 01/06/2019, 12:50 PM

## 2019-01-06 NOTE — Progress Notes (Signed)
Pt continues to attempt to get out of bed. Pt is confused and agitated. Pt is in a low bed, with bed alarm bed on and floor mats in place.   Continued nurse and nurse tech contact with pt for safety.

## 2019-01-06 NOTE — Evaluation (Signed)
Occupational Therapy Evaluation Patient Details Name: Julie Durham MRN: [id number] DOB: 09/16/1940 Today's Date: 01/06/2019    History of Present Illness Patient is a 78 y/o female who presents with diarrhea and weakness. Admitted with acute encephalopathy secondary to UTI vs medications. Question underlying dementia? Recent tibial fx per notes, wearing KI. PMH includes dementia, anemia, brain mass, encephalitis, takotsubo cardiomyopathy, HTN, HLD, kyphoplasty L1, L4 10/2018.   Clinical Impression   This 78 y/o female presents with the above. PLOF obtained via chart review as pt with cognitive impairments and not accurate historian, residing in SNF PTA. Pt requiring two person assist for safe completion of functional transfers this session, tolerating stand pivot to/from [facility] for toileting ADL using RW. Pt requiring max-totalA for LB and toileting ADL, minA for seated UB ADL. Pt with tangential, intermittently nonsensical speech during session but able to redirect to perform functional/mobility tasks. She will benefit from continued acute OT services and recommend continued OT services in SNF setting after discharge to progress her safety and independence with ADL and mobility. Will follow.     Follow Up Recommendations  SNF;Supervision/Assistance - 24 hour    Equipment Recommendations  Other (comment)(defer to next venue)           Precautions / Restrictions Precautions Precautions: Fall Required Braces or Orthoses: Knee Immobilizer - Right Knee Immobilizer - Right: On at all times Restrictions Weight Bearing Restrictions: Yes RLE Weight Bearing: Weight bearing as tolerated Other Position/Activity Restrictions: per orders 1 week ago      Mobility Bed Mobility Overal bed mobility: Needs Assistance Bed Mobility: Supine to Sit     Supine to sit: Max assist;HOB elevated Sit to supine: Max assist;+2 for safety/equipment   General bed mobility comments: Assist needed with LEs  and to elevate trunk with max cues for sequencing.  Transfers Overall transfer level: Needs assistance Equipment used: Rolling walker (2 wheeled) Transfers: Sit to/from Stand Sit to Stand: Mod assist;+2 physical assistance Stand pivot transfers: Mod assist;+2 physical assistance       General transfer comment: Assist to power to standing with cues for technique, posterior lean through hips and narrow BoS. Stood from EOB x1, from [facility] x1, SPT bed to/from bSC with MOd A for RW management and balance.    Balance Overall balance assessment: Needs assistance;History of Falls Sitting-balance support: Single extremity supported;Feet supported Sitting balance-Leahy Scale: Fair Sitting balance - Comments: Close min guard for safety.   Standing balance support: During functional activity Standing balance-Leahy Scale: Poor Standing balance comment: heavy reliance on external support and use of RW, posterior lean                           ADL either performed or assessed with clinical judgement   ADL Overall ADL's : Needs assistance/impaired Eating/Feeding: Set up;Minimal assistance;Sitting Eating/Feeding Details (indicate cue type and reason): encouraged pt to eat breakfast post session Grooming: Minimal assistance;Sitting   Upper Body Bathing: Minimal assistance;Sitting   Lower Body Bathing: Maximal assistance;+2 for safety/equipment;+2 for physical assistance;Sit to/from stand;Sitting/lateral leans   Upper Body Dressing : Minimal assistance;Sitting   Lower Body Dressing: Maximal assistance;+2 for physical assistance;+2 for safety/equipment;Sit to/from stand;Sitting/lateral leans   Toilet Transfer: Moderate assistance;+2 for physical assistance;+2 for safety/equipment;Stand-pivot   Toileting- Clothing Manipulation and Hygiene: Maximal assistance;Total assistance;+2 for physical assistance;+2 for safety/equipment;Sit to/from stand       Functional mobility during ADLs:  Moderate assistance;+2 for physical assistance;+2 for safety/equipment(stand pivot transfers)  General ADL Comments: pt with notable confusion, weakness, decreased sitting/standing balance and functional performance     Pertinent Vitals/Pain Pain Assessment: Faces Faces Pain Scale: Hurts little more Pain Location: bil feet Pain Descriptors / Indicators: Burning Pain Intervention(s): Monitored during session;Repositioned     Hand Dominance Right   Extremity/Trunk Assessment Upper Extremity Assessment Upper Extremity Assessment: Generalized weakness   Lower Extremity Assessment Lower Extremity Assessment: Defer to PT evaluation   Cervical / Trunk Assessment Cervical / Trunk Assessment: Kyphotic   Communication Communication Communication: HOH   Cognition Arousal/Alertness: Awake/alert Behavior During Therapy: WFL for tasks assessed/performed Overall Cognitive Status: Impaired/Different from baseline Area of Impairment: Orientation;Attention;Memory;Following commands;Safety/judgement;Problem solving                 Orientation Level: Disoriented to;Situation;Time;Place Current Attention Level: Focused Memory: Decreased short-term memory Following Commands: Follows one step commands with increased time;Follows one step commands inconsistently Safety/Judgement: Decreased awareness of safety;Decreased awareness of deficits   Problem Solving: Slow processing;Decreased initiation;Difficulty sequencing;Requires verbal cues;Requires tactile cues General Comments: Pt very confused, talking about things unrelated to questions asked. Poor attention. Easily distracted.   General Comments       Exercises     Shoulder Instructions      Home Living Family/patient expects to be discharged to:: Skilled nursing facility                                        Prior Functioning/Environment Level of Independence: Needs assistance        Comments: Pt poor  historian and not able to state PLOF. Likely total A for all ADLs, possibly transferring with assist.        OT Problem List: Decreased strength;Decreased activity tolerance;Decreased range of motion;Impaired balance (sitting and/or standing);Decreased safety awareness;Decreased knowledge of use of DME or AE;Decreased cognition;Decreased knowledge of precautions      OT Treatment/Interventions: Self-care/ADL training;Therapeutic exercise;Energy conservation;DME and/or AE instruction;Therapeutic activities;Patient/family education;Balance training;Cognitive remediation/compensation    OT Goals(Current goals can be found in the care plan section) Acute Rehab OT Goals Patient Stated Goal: not able to state OT Goal Formulation: With patient Time For Goal Achievement: 01/20/19 Potential to Achieve Goals: Good  OT Frequency: Min 2X/week   Barriers to D/C:            Co-evaluation PT/OT/SLP Co-Evaluation/Treatment: Yes Reason for Co-Treatment: For patient/therapist safety;To address functional/ADL transfers;Necessary to address cognition/behavior during functional activity   OT goals addressed during session: ADL's and self-care      AM-PAC OT "6 Clicks" Daily Activity     Outcome Measure Help from another person eating meals?: A Little Help from another person taking care of personal grooming?: A Lot Help from another person toileting, which includes using toliet, bedpan, or urinal?: A Lot Help from another person bathing (including washing, rinsing, drying)?: A Lot Help from another person to put on and taking off regular upper body clothing?: A Little Help from another person to put on and taking off regular lower body clothing?: A Lot 6 Click Score: 14   End of Session Equipment Utilized During Treatment: Rolling walker;Right knee immobilizer Nurse Communication: Mobility status  Activity Tolerance: Patient tolerated treatment well Patient left: in bed;with call bell/phone  within reach;with bed alarm set  OT Visit Diagnosis: Other abnormalities of gait and mobility (R26.89);Muscle weakness (generalized) (M62.81);Other symptoms and signs involving cognitive function  Time: 1610-9604: 0812-0834 OT Time Calculation (min): 22 min Charges:  OT General Charges $OT Visit: 1 Visit OT Evaluation $OT Eval Moderate Complexity: 1 Mod  Marcy SirenBreanna Addis Tuohy, OT [facility] Pager [phone] Office [phone]   Orlando PennerBreanna L Deliliah Spranger 01/06/2019, 1:13 PM

## 2019-01-06 NOTE — Progress Notes (Signed)
PROGRESS NOTE    Julie Durham  ZOX:[id number] DOB: 09-03-1940 [name]: 01/03/2019 PCP: Patient, No Pcp Per   Brief Narrative:  HPI On 01/04/2019 by Dr. Gean Birchwood Julie Durham is a 78 y.o. female with history of recently diagnosed cardiomyopathy likely from Takotsubo after patient had lumbar surgery in October with EF of 45% and readmitted in December and discharged about 10 days ago after being conservatively treated for tibial fracture was brought to the ER the patient was feeling weak and had an episode of diarrhea.  Patient is not a good historian and appears to be a little bit confused.  Patient otherwise denies any chest pain shortness of breath nausea vomiting.  Interim history Patient admitted with acute metabolic encephalopathy thought to be secondary to UTI versus medications.  Question of patient has underlying dementia.  We will continue to treat reversible causes and monitor.  Also pending therapy. Assessment & Plan   Acute metabolic encephalopathy possibly secondary to UTI versus medications -unknown baseline -CT head unremarkable but does show chronic atrophy -Question if patient has underlying dementia given her answers to some of the questions, however may be due to urinary tract infection -Influenza A and B, Covid 19 negative -UA: Many bacteria, 21-50 WBC, moderate leukocytes -Urine culture >100K Enterococcus Faecalis -B12 and folate within normal limits, TSH 2.121, ammonia WNL, RPR nonreactive -will check Vitamin B1 -Initially placed on ceftriaxone, however transitioned to Unasyn   Diarrhea -GI pathogen panel pending  Essential hypertension -Continue irbesartan and metoprolol  Recent diagnosis of Takotsubo's cardiomyopathy -Continue losartan and metoprolol -Patient appears to be stable, euvolemic  Recent right tibial fracture and kyphoplasty -Patient on medication, will continue to monitor closely for mental status changes -Continue knee  immobilizer-patient to follow-up with orthopedics, Dr. Wynelle Link -PT and OT consulted  Chronic anemia -Anemia panel showed adequate iron and storage.  B12 540, folate 20 -hemoglobin currently stable, 10.5 -monitor CBC  Hypokalemia -Continue to supplement -may be secondary to GI losses from diarrhea -Monitor  BMP  Constipation -received a call from RN on 01/05/2019 stating patient now with fecal impaction -tap water enema ordered, however not given  Hypoglycemia -no history of diabetes - likely secondary to poor oral intake -blood sugar noted to be 35 the morning of 01/05/2019 -CBGs stable, will continue to monitor  -encourage feeding -avoiding IVF given cardiomyopathy  DVT Prophylaxis Lovenox  Code Status: Full  Family Communication: None at bedside.  Disposition Plan: Admitted.  Patient still has some confusion, unknown baseline.  Continue to treat UTI. Pending PT/OT. Patient does state that she lives alone, with a cat.  Consultants None  Procedures  None  Antibiotics   Anti-infectives (From admission, onward)   Start     Dose/Rate Route Frequency Ordered Stop   01/05/19 1345  ampicillin-sulbactam (UNASYN) 1.5 g in sodium chloride 0.9 % 100 mL IVPB     1.5 g 200 mL/hr over 30 Minutes Intravenous Every 8 hours 01/05/19 1340     01/05/19 1145  amoxicillin (AMOXIL) capsule 500 mg  Status:  Discontinued     500 mg Oral Every 12 hours 01/05/19 1141 01/05/19 1325   01/04/19 2200  cefTRIAXone (ROCEPHIN) 1 g in sodium chloride 0.9 % 100 mL IVPB  Status:  Discontinued     1 g 200 mL/hr over 30 Minutes Intravenous Every 24 hours 01/04/19 0542 01/05/19 1141   01/03/19 2245  cefTRIAXone (ROCEPHIN) 1 g in sodium chloride 0.9 % 100 mL IVPB     1  g 200 mL/hr over 30 Minutes Intravenous  Once 01/03/19 2239 01/03/19 2343      Subjective:   Julie Durham seen and examined today. Patient states she does not know where she is and is afraid she will get fired if she doesn't go to  work. She has no complaints.  Objective:   Vitals:   01/05/19 1137 01/05/19 2037 01/06/19 0100 01/06/19 0418  BP: (!) 167/82 (!) 172/92  (!) 173/84  Pulse: 78 96  80  Resp: 20 18  18   Temp: 98.2 F (36.8 C) 97.7 F (36.5 C)  (!) 97.4 F (36.3 C)  TempSrc: Oral Oral  Oral  SpO2: 100% 97%  99%  Weight:   40.4 kg   Height:        Intake/Output Summary (Last 24 hours) at 01/06/2019 0820 Last data filed at 01/06/2019 [id number] Gross per 24 hour  Intake 808.61 ml  Output 1011 ml  Net -202.39 ml   Filed Weights   01/04/19 0705 01/05/19 0516 01/06/19 0100  Weight: 29 kg 37.6 kg 40.4 kg   Exam  General: Well developed, thin, elderly, NAD  HEENT: NCAT, mucous membranes moist.   Cardiovascular: S1 S2 auscultated, RRR  Respiratory: Clear to auscultation bilaterally   Abdomen: Soft, nontender, nondistended, + bowel sounds  Extremities: warm dry without cyanosis clubbing or edema  Neuro: AAOx3 (self, time, current events, however not place or situation), nonfocal  Psych: Confusion but Appropriate   Data Reviewed: I have personally reviewed following labs and imaging studies  CBC: Recent Labs  Lab 01/03/19 2128 01/05/19 0419 01/06/19 0301  WBC 6.0 5.6  --   NEUTROABS 4.7  --   --   HGB 11.6* 10.7* 10.5*  HCT 35.6* 32.2* 31.8*  MCV 95.4 92.8  --   PLT 406* 386  --    Basic Metabolic Panel: Recent Labs  Lab 01/03/19 2128 01/05/19 0419 01/06/19 0301  NA 142 139 140  K 3.3* 3.5 3.4*  CL 102 102 102  CO2 28 25 29   GLUCOSE 77 46* 100*  BUN 30* 31* 19  CREATININE 0.54 0.74 0.53  CALCIUM 9.3 8.9 8.8*  MG  --   --  2.0   GFR: Estimated Creatinine Clearance: 36.4 mL/min (by C-G formula based on SCr of 0.53 mg/dL). Liver Function Tests: Recent Labs  Lab 01/03/19 2128  AST 21  ALT 16  ALKPHOS 190*  BILITOT 0.8  PROT 6.1*  ALBUMIN 3.1*   No results for input(s): LIPASE, AMYLASE in the last 168 hours. Recent Labs  Lab 01/04/19 0726  AMMONIA 14    Coagulation Profile: No results for input(s): INR, PROTIME in the last 168 hours. Cardiac Enzymes: No results for input(s): CKTOTAL, CKMB, CKMBINDEX, TROPONINI in the last 168 hours. BNP (last 3 results) No results for input(s): PROBNP in the last 8760 hours. HbA1C: No results for input(s): HGBA1C in the last 72 hours. CBG: Recent Labs  Lab 01/05/19 0756 01/05/19 1116 01/05/19 1612 01/05/19 2127 01/06/19 0641  GLUCAP 169* 227* 113* 112* 90   Lipid Profile: No results for input(s): CHOL, HDL, LDLCALC, TRIG, CHOLHDL, LDLDIRECT in the last 72 hours. Thyroid Function Tests: Recent Labs    01/04/19 0726  TSH 2.121   Anemia Panel: Recent Labs    01/04/19 0726  VITAMINB12 540  FOLATE 20.0  FERRITIN 58  TIBC 231*  IRON 31  RETICCTPCT 1.9   Urine analysis:    Component Value Date/Time   COLORURINE AMBER (A) 01/03/2019 2211  APPEARANCEUR CLOUDY (A) 01/03/2019 2211   LABSPEC 1.021 01/03/2019 2211   PHURINE 5.0 01/03/2019 2211   GLUCOSEU NEGATIVE 01/03/2019 2211   HGBUR LARGE (A) 01/03/2019 2211   BILIRUBINUR NEGATIVE 01/03/2019 2211   KETONESUR 20 (A) 01/03/2019 2211   PROTEINUR 30 (A) 01/03/2019 2211   NITRITE NEGATIVE 01/03/2019 2211   LEUKOCYTESUR MODERATE (A) 01/03/2019 2211   Sepsis Labs: @LABRCNTIP (procalcitonin:4,lacticidven:4)  ) Recent Results (from the past 240 hour(s))  Respiratory Panel by RT PCR (Flu A&B, Covid) - Nasopharyngeal Swab     Status: None   Collection Time: 01/03/19 10:22 PM   Specimen: Nasopharyngeal Swab  Result Value Ref Range Status   SARS Coronavirus 2 by RT PCR NEGATIVE NEGATIVE Final    Comment: (NOTE) SARS-CoV-2 target nucleic acids are NOT DETECTED. The SARS-CoV-2 RNA is generally detectable in upper respiratoy specimens during the acute phase of infection. The lowest concentration of SARS-CoV-2 viral copies this assay can detect is 131 copies/mL. A negative result does not preclude SARS-Cov-2 infection and should not be  used as the sole basis for treatment or other patient management decisions. A negative result may occur with  improper specimen collection/handling, submission of specimen other than nasopharyngeal swab, presence of viral mutation(s) within the areas targeted by this assay, and inadequate number of viral copies (<131 copies/mL). A negative result must be combined with clinical observations, patient history, and epidemiological information. The expected result is Negative. Fact Sheet for Patients:  01/05/19 Fact Sheet for Healthcare Providers:  https://www.moore.com/ This test is not yet ap proved or cleared by the https://www.young.biz/ FDA and  has been authorized for detection and/or diagnosis of SARS-CoV-2 by FDA under an Emergency Use Authorization (EUA). This EUA will remain  in effect (meaning this test can be used) for the duration of the COVID-19 declaration under Section 564(b)(1) of the Act, 21 U.S.C. section 360bbb-3(b)(1), unless the authorization is terminated or revoked sooner.    Influenza A by PCR NEGATIVE NEGATIVE Final   Influenza B by PCR NEGATIVE NEGATIVE Final    Comment: (NOTE) The Xpert Xpress SARS-CoV-2/FLU/RSV assay is intended as an aid in  the diagnosis of influenza from Nasopharyngeal swab specimens and  should not be used as a sole basis for treatment. Nasal washings and  aspirates are unacceptable for Xpert Xpress SARS-CoV-2/FLU/RSV  testing. Fact Sheet for Patients: [place] Fact Sheet for Healthcare Providers: https://www.moore.com/ This test is not yet approved or cleared by the https://www.young.biz/ FDA and  has been authorized for detection and/or diagnosis of SARS-CoV-2 by  FDA under an Emergency Use Authorization (EUA). This EUA will remain  in effect (meaning this test can be used) for the duration of the  Covid-19 declaration under Section 564(b)(1) of the  Act, 21  U.S.C. section 360bbb-3(b)(1), unless the authorization is  terminated or revoked. Performed at [facility], [address] [place], [place]   Urine culture     Status: Abnormal   Collection Time: 01/03/19 10:40 PM   Specimen: Urine, Random  Result Value Ref Range Status   Specimen Description URINE, RANDOM  Final   Special Requests   Final    NONE Performed at [facility], [address] [place], [place]    Culture >=100,000 COLONIES/mL ENTEROCOCCUS FAECALIS (A)  Final   Report Status 01/06/2019 FINAL  Final   Organism ID, Bacteria ENTEROCOCCUS FAECALIS (A)  Final      Susceptibility   Enterococcus faecalis - MIC*  AMPICILLIN <=2 SENSITIVE Sensitive     NITROFURANTOIN <=16 SENSITIVE Sensitive     VANCOMYCIN 2 SENSITIVE Sensitive     * >=100,000 COLONIES/mL ENTEROCOCCUS FAECALIS  MRSA PCR Screening     Status: None   Collection Time: 01/04/19  2:55 PM   Specimen: Nasal Mucosa; Nasopharyngeal  Result Value Ref Range Status   MRSA by PCR NEGATIVE NEGATIVE Final    Comment:        The GeneXpert MRSA Assay (FDA approved for NASAL specimens only), is one component of a comprehensive MRSA colonization surveillance program. It is not intended to diagnose MRSA infection nor to guide or monitor treatment for MRSA infections. Performed at [facility], [address] [place], [place] [address]       Radiology Studies: No results found.   Scheduled Meds: . aspirin EC  81 mg Oral Daily  . atorvastatin  10 mg Oral q1800  . Chlorhexidine Gluconate Cloth  6 each Topical Daily  . enoxaparin (LOVENOX) injection  15 mg Subcutaneous Q24H  . irbesartan  150 mg Oral Daily  . metoprolol tartrate  12.5 mg Oral BID  . multivitamin  1 tablet Oral BID  . QUEtiapine  12.5 mg Oral QHS   Continuous Infusions: . sodium chloride 10 mL/hr at 01/06/19 0626  . ampicillin-sulbactam (UNASYN) IV Stopped (01/06/19 0609)     LOS: 2 days    Time Spent in minutes   45 minutes  Krishang Reading D.O. on 01/06/2019 at 8:20 AM  Between 7am to 7pm - Please see pager noted on [email or web address]  After 7pm go to [email or web address]  And look for the night coverage person covering for me after hours  [facility] Office  [phone]

## 2019-01-07 ENCOUNTER — Inpatient Hospital Stay (HOSPITAL_COMMUNITY): Payer: Medicare Other

## 2019-01-07 LAB — COMPREHENSIVE METABOLIC PANEL
ALT: 15 U/L (ref 0–44)
AST: 21 U/L (ref 15–41)
Albumin: 2.5 g/dL — ABNORMAL LOW (ref 3.5–5.0)
Alkaline Phosphatase: 153 U/L — ABNORMAL HIGH (ref 38–126)
Anion gap: 9 (ref 5–15)
BUN: 7 mg/dL — ABNORMAL LOW (ref 8–23)
CO2: 28 mmol/L (ref 22–32)
Calcium: 8.6 mg/dL — ABNORMAL LOW (ref 8.9–10.3)
Chloride: 104 mmol/L (ref 98–111)
Creatinine, Ser: 0.45 mg/dL (ref 0.44–1.00)
GFR calc Af Amer: >60 mL/min (ref >60)
GFR calc non Af Amer: >60 mL/min (ref >60)
Glucose, Bld: 82 mg/dL (ref 70–99)
Potassium: 3.4 mmol/L — ABNORMAL LOW (ref 3.5–5.1)
Sodium: 141 mmol/L (ref 135–145)
Total Bilirubin: <0.1 mg/dL — ABNORMAL LOW (ref 0.3–1.2)
Total Protein: 4.8 g/dL — ABNORMAL LOW (ref 6.5–8.1)

## 2019-01-07 LAB — GLUCOSE, CAPILLARY
Glucose-Capillary: 82 mg/dL (ref 70–99)
Glucose-Capillary: 122 mg/dL — ABNORMAL HIGH (ref 70–99)
Glucose-Capillary: 57 mg/dL — ABNORMAL LOW (ref 70–99)
Glucose-Capillary: 71 mg/dL (ref 70–99)
Glucose-Capillary: 72 mg/dL (ref 70–99)
Glucose-Capillary: 104 mg/dL — ABNORMAL HIGH (ref 70–99)

## 2019-01-07 LAB — SARS CORONAVIRUS 2 (TAT 6-24 HRS): SARS Coronavirus 2: NEGATIVE

## 2019-01-07 LAB — HEMOGLOBIN AND HEMATOCRIT, BLOOD
HCT: 33.1 % — ABNORMAL LOW (ref 36.0–46.0)
Hemoglobin: 11.1 g/dL — ABNORMAL LOW (ref 12.0–15.0)

## 2019-01-07 LAB — MAGNESIUM: Magnesium: 2 mg/dL (ref 1.7–2.4)

## 2019-01-07 MED ORDER — DEXTROSE 50 % IV SOLN
INTRAVENOUS | Status: AC
  Administered 2019-01-07: 50 mL
  Filled 2019-01-07: qty 50

## 2019-01-07 MED ORDER — POTASSIUM CHLORIDE CRYS ER 20 MEQ PO TBCR
40.0000 meq | EXTENDED_RELEASE_TABLET | Freq: Once | ORAL | Status: AC
  Administered 2019-01-07: 40 meq via ORAL
  Filled 2019-01-07: qty 2

## 2019-01-07 MED ORDER — DEXTROSE 50 % IV SOLN: 50.0000 mL | Freq: Once | INTRAVENOUS | Status: AC

## 2019-01-07 MED ORDER — LORAZEPAM 2 MG/ML IJ SOLN
0.5000 mg | Freq: Four times a day (QID) | INTRAMUSCULAR | Status: DC | PRN
  Administered 2019-01-07: 0.5 mg via INTRAVENOUS
  Filled 2019-01-07: qty 1

## 2019-01-07 MED ORDER — METOPROLOL TARTRATE 5 MG/5ML IV SOLN
2.5000 mg | Freq: Four times a day (QID) | INTRAVENOUS | Status: DC | PRN
  Administered 2019-01-07: 2.5 mg via INTRAVENOUS
  Filled 2019-01-07: qty 5

## 2019-01-07 NOTE — Progress Notes (Signed)
PROGRESS NOTE    Julie Durham  ZOX:[id number] DOB: 11/19/1940 [name]: 01/03/2019 PCP: Patient, No Pcp Per   Brief Narrative:  HPI On 01/04/2019 by Dr. Midge MiniumArshad Kakrakandy Julie BelfastBarbara E Warning is a 78 y.o. female with history of recently diagnosed cardiomyopathy likely from Takotsubo after patient had lumbar surgery in October with EF of 45% and readmitted in December and discharged about 10 days ago after being conservatively treated for tibial fracture was brought to the ER the patient was feeling weak and had an episode of diarrhea.  Patient is not a good historian and appears to be a little bit confused.  Patient otherwise denies any chest pain shortness of breath nausea vomiting.  Interim history Patient admitted with acute metabolic encephalopathy thought to be secondary to UTI versus medications.  Question of patient has underlying dementia.  We will continue to treat reversible causes and monitor.  Also pending therapy. Assessment & Plan   Acute metabolic encephalopathy possibly secondary to UTI versus medications -unknown baseline -CT head unremarkable but does show chronic atrophy -Question if patient has underlying dementia given her answers to some of the questions, however may be due to urinary tract infection -Influenza A and B, Covid 19 negative -UA: Many bacteria, 21-50 WBC, moderate leukocytes -Urine culture >100K Enterococcus Faecalis -B12 and folate within normal limits, TSH 2.121, ammonia WNL, RPR nonreactive -will check Vitamin B1 -Initially placed on ceftriaxone, however transitioned to Unasyn  -Given continued confusion, will obtain MRI brain   Diarrhea -GI pathogen panel unremarkable   Essential hypertension -Continue irbesartan and metoprolol  Recent diagnosis of Takotsubo's cardiomyopathy -Continue losartan and metoprolol -Patient appears to be stable, euvolemic  Recent right tibial fracture and kyphoplasty -Patient on medication, will continue to monitor  closely for mental status changes -Continue knee immobilizer-patient to follow-up with orthopedics, Dr. Lequita HaltAluisio -PT and OT consulted, recommended SNF  Chronic anemia -Anemia panel showed adequate iron and storage.  B12 540, folate 20 -hemoglobin currently stable, 11.1 -monitor CBC  Hypokalemia -Continue to supplement -may be secondary to GI losses from diarrhea -Monitor  BMP  Constipation -received a call from RN on 01/05/2019 stating patient now with fecal impaction -tap water enema given  Hypoglycemia -no history of diabetes - likely secondary to poor oral intake -blood sugar noted to be 35 the morning of 01/05/2019 -CBGs stable, will continue to monitor  -encourage feeding -avoiding IVF given cardiomyopathy  DVT Prophylaxis Lovenox  Code Status: Full  Family Communication: None at bedside.  Disposition Plan: Admitted.  Patient still has some confusion, unknown baseline.  Continue to treat UTI. Will obtain MRI brain. Dispo TBD. Patient lives alone.   Consultants None  Procedures  None  Antibiotics   Anti-infectives (From admission, onward)   Start     Dose/Rate Route Frequency Ordered Stop   01/05/19 1345  ampicillin-sulbactam (UNASYN) 1.5 g in sodium chloride 0.9 % 100 mL IVPB     1.5 g 200 mL/hr over 30 Minutes Intravenous Every 8 hours 01/05/19 1340     01/05/19 1145  amoxicillin (AMOXIL) capsule 500 mg  Status:  Discontinued     500 mg Oral Every 12 hours 01/05/19 1141 01/05/19 1325   01/04/19 2200  cefTRIAXone (ROCEPHIN) 1 g in sodium chloride 0.9 % 100 mL IVPB  Status:  Discontinued     1 g 200 mL/hr over 30 Minutes Intravenous Every 24 hours 01/04/19 0542 01/05/19 1141   01/03/19 2245  cefTRIAXone (ROCEPHIN) 1 g in sodium chloride 0.9 % 100 mL  IVPB     1 g 200 mL/hr over 30 Minutes Intravenous  Once 01/03/19 2239 01/03/19 2343      Subjective:   Julie Durham seen and examined today.  Patient confused this morning.  States she is in pain and not  feeling well but cannot expand on this.  Says she needs to see a doctor.  Objective:   Vitals:   01/06/19 1220 01/06/19 1635 01/06/19 1924 01/07/19 0557  BP: (!) 146/82 (!) 176/86 (!) 178/88 (!) 143/74  Pulse: 83 81 73 89  Resp: 18 18 18 18   Temp: 98 F (36.7 C) 98.2 F (36.8 C) 98.1 F (36.7 C)   TempSrc: Oral Oral    SpO2: 100% 99% 100% 100%  Weight:    38.6 kg  Height:        Intake/Output Summary (Last 24 hours) at 01/07/2019 0900 Last data filed at 01/07/2019 6712 Gross per 24 hour  Intake 300.88 ml  Output 1901 ml  Net -1600.12 ml   Filed Weights   01/05/19 0516 01/06/19 0100 01/07/19 0557  Weight: 37.6 kg 40.4 kg 38.6 kg   Exam  General: Well developed, thin, elderly, NAD  HEENT: NCAT, mucous membranes moist.   Cardiovascular: S1 S2 auscultated, RRR  Respiratory: Clear to auscultation bilaterally  Abdomen: Soft, nontender, nondistended, + bowel sounds  Extremities: warm dry without cyanosis clubbing or edema  Neuro: AAOx1 (self), nonfocal  Psych: Confused  Data Reviewed: I have personally reviewed following labs and imaging studies  CBC: Recent Labs  Lab 01/03/19 2128 01/05/19 0419 01/06/19 0301 01/07/19 0503  WBC 6.0 5.6  --   --   NEUTROABS 4.7  --   --   --   HGB 11.6* 10.7* 10.5* 11.1*  HCT 35.6* 32.2* 31.8* 33.1*  MCV 95.4 92.8  --   --   PLT 406* 386  --   --    Basic Metabolic Panel: Recent Labs  Lab 01/03/19 2128 01/05/19 0419 01/06/19 0301 01/07/19 0503  NA 142 139 140 141  K 3.3* 3.5 3.4* 3.4*  CL 102 102 102 104  CO2 28 25 29 28   GLUCOSE 77 46* 100* 82  BUN 30* 31* 19 7*  CREATININE 0.54 0.74 0.53 0.45  CALCIUM 9.3 8.9 8.8* 8.6*  MG  --   --  2.0  --    GFR: Estimated Creatinine Clearance: 34.7 mL/min (by C-G formula based on SCr of 0.45 mg/dL). Liver Function Tests: Recent Labs  Lab 01/03/19 2128 01/07/19 0503  AST 21 21  ALT 16 15  ALKPHOS 190* 153*  BILITOT 0.8 <0.1*  PROT 6.1* 4.8*  ALBUMIN 3.1* 2.5*    No results for input(s): LIPASE, AMYLASE in the last 168 hours. Recent Labs  Lab 01/04/19 0726  AMMONIA 14   Coagulation Profile: No results for input(s): INR, PROTIME in the last 168 hours. Cardiac Enzymes: No results for input(s): CKTOTAL, CKMB, CKMBINDEX, TROPONINI in the last 168 hours. BNP (last 3 results) No results for input(s): PROBNP in the last 8760 hours. HbA1C: No results for input(s): HGBA1C in the last 72 hours. CBG: Recent Labs  Lab 01/06/19 0641 01/06/19 1056 01/06/19 1556 01/06/19 2117 01/07/19 0620  GLUCAP 90 125* 92 75 82   Lipid Profile: No results for input(s): CHOL, HDL, LDLCALC, TRIG, CHOLHDL, LDLDIRECT in the last 72 hours. Thyroid Function Tests: No results for input(s): TSH, T4TOTAL, FREET4, T3FREE, THYROIDAB in the last 72 hours. Anemia Panel: No results for input(s): VITAMINB12, FOLATE, FERRITIN,  TIBC, IRON, RETICCTPCT in the last 72 hours. Urine analysis:    Component Value Date/Time   COLORURINE AMBER (A) 01/03/2019 2211   APPEARANCEUR CLOUDY (A) 01/03/2019 2211   LABSPEC 1.021 01/03/2019 2211   PHURINE 5.0 01/03/2019 2211   GLUCOSEU NEGATIVE 01/03/2019 2211   HGBUR LARGE (A) 01/03/2019 2211   BILIRUBINUR NEGATIVE 01/03/2019 2211   KETONESUR 20 (A) 01/03/2019 2211   PROTEINUR 30 (A) 01/03/2019 2211   NITRITE NEGATIVE 01/03/2019 2211   LEUKOCYTESUR MODERATE (A) 01/03/2019 2211   Sepsis Labs: (procalcitonin:4,lacticidven:4)  ) Recent Results (from the past 240 hour(s))  GI pathogen panel by PCR, stool     Status: None   Collection Time: 01/03/19  8:52 PM   Specimen: STOOL  Result Value Ref Range Status   Plesiomonas shigelloides NOT DETECTED NOT DETECTED Final   Yersinia enterocolitica NOT DETECTED NOT DETECTED Final   Vibrio NOT DETECTED NOT DETECTED Final   Enteropathogenic E coli NOT DETECTED NOT DETECTED Final   E coli (ETEC) LT/ST NOT DETECTED NOT DETECTED Final   E coli 0157 by PCR Not applicable NOT DETECTED  Final   Cryptosporidium by PCR NOT DETECTED NOT DETECTED Final   Entamoeba histolytica NOT DETECTED NOT DETECTED Final   Adenovirus F 40/41 NOT DETECTED NOT DETECTED Final   Norovirus GI/GII NOT DETECTED NOT DETECTED Final   Sapovirus NOT DETECTED NOT DETECTED Final    Comment: (NOTE) Performed At: [facility] [address] [place], [place] [id number] Jolene Schimke MD WU:[id number]    Vibrio cholerae NOT DETECTED NOT DETECTED Final   Campylobacter by PCR NOT DETECTED NOT DETECTED Final   Salmonella by PCR NOT DETECTED NOT DETECTED Final   E coli (STEC) NOT DETECTED NOT DETECTED Final   Enteroaggregative E coli NOT DETECTED NOT DETECTED Final   Shigella by PCR NOT DETECTED NOT DETECTED Final   Cyclospora cayetanensis NOT DETECTED NOT DETECTED Final   Astrovirus NOT DETECTED NOT DETECTED Final   G lamblia by PCR NOT DETECTED NOT DETECTED Final   Rotavirus A by PCR NOT DETECTED NOT DETECTED Final  Respiratory Panel by RT PCR (Flu A&B, Covid) - Nasopharyngeal Swab     Status: None   Collection Time: 01/03/19 10:22 PM   Specimen: Nasopharyngeal Swab  Result Value Ref Range Status   SARS Coronavirus 2 by RT PCR NEGATIVE NEGATIVE Final    Comment: (NOTE) SARS-CoV-2 target nucleic acids are NOT DETECTED. The SARS-CoV-2 RNA is generally detectable in upper respiratoy specimens during the acute phase of infection. The lowest concentration of SARS-CoV-2 viral copies this assay can detect is 131 copies/mL. A negative result does not preclude SARS-Cov-2 infection and should not be used as the sole basis for treatment or other patient management decisions. A negative result may occur with  improper specimen collection/handling, submission of specimen other than nasopharyngeal swab, presence of viral mutation(s) within the areas targeted by this assay, and inadequate number of viral copies (<131 copies/mL). A negative result must be combined with clinical observations, patient  history, and epidemiological information. The expected result is Negative. Fact Sheet for Patients:  https://www.moore.com/ Fact Sheet for Healthcare Providers:  https://www.young.biz/ This test is not yet ap proved or cleared by the [place] FDA and  has been authorized for detection and/or diagnosis of SARS-CoV-2 by FDA under an Emergency Use Authorization (EUA). This EUA will remain  in effect (meaning this test can be used) for the duration of the COVID-19 declaration under Section 564(b)(1) of the Act, 21  U.S.C. section 360bbb-3(b)(1), unless the authorization is terminated or revoked sooner.    Influenza A by PCR NEGATIVE NEGATIVE Final   Influenza B by PCR NEGATIVE NEGATIVE Final    Comment: (NOTE) The Xpert Xpress SARS-CoV-2/FLU/RSV assay is intended as an aid in  the diagnosis of influenza from Nasopharyngeal swab specimens and  should not be used as a sole basis for treatment. Nasal washings and  aspirates are unacceptable for Xpert Xpress SARS-CoV-2/FLU/RSV  testing. Fact Sheet for Patients: https://www.moore.com/ Fact Sheet for Healthcare Providers: https://www.young.biz/ This test is not yet approved or cleared by the [place] FDA and  has been authorized for detection and/or diagnosis of SARS-CoV-2 by  FDA under an Emergency Use Authorization (EUA). This EUA will remain  in effect (meaning this test can be used) for the duration of the  Covid-19 declaration under Section 564(b)(1) of the Act, 21  U.S.C. section 360bbb-3(b)(1), unless the authorization is  terminated or revoked. Performed at [facility], [address] [place], [place] [address]   Urine culture     Status: Abnormal   Collection Time: 01/03/19 10:40 PM   Specimen: Urine, Random  Result Value Ref Range Status   Specimen Description URINE, RANDOM  Final   Special Requests   Final    NONE Performed at  [facility], [address], [place], [place] [address]    Culture >=100,000 COLONIES/mL ENTEROCOCCUS FAECALIS (A)  Final   Report Status 01/06/2019 FINAL  Final   Organism ID, Bacteria ENTEROCOCCUS FAECALIS (A)  Final      Susceptibility   Enterococcus faecalis - MIC*    AMPICILLIN <=2 SENSITIVE Sensitive     NITROFURANTOIN <=16 SENSITIVE Sensitive     VANCOMYCIN 2 SENSITIVE Sensitive     * >=100,000 COLONIES/mL ENTEROCOCCUS FAECALIS  MRSA PCR Screening     Status: None   Collection Time: 01/04/19  2:55 PM   Specimen: Nasal Mucosa; Nasopharyngeal  Result Value Ref Range Status   MRSA by PCR NEGATIVE NEGATIVE Final    Comment:        The GeneXpert MRSA Assay (FDA approved for NASAL specimens only), is one component of a comprehensive MRSA colonization surveillance program. It is not intended to diagnose MRSA infection nor to guide or monitor treatment for MRSA infections. Performed at [facility], [address] [place], [place] [address]       Radiology Studies: No results found.   Scheduled Meds: . aspirin EC  81 mg Oral Daily  . atorvastatin  10 mg Oral q1800  . Chlorhexidine Gluconate Cloth  6 each Topical Daily  . enoxaparin (LOVENOX) injection  15 mg Subcutaneous Q24H  . irbesartan  150 mg Oral Daily  . metoprolol tartrate  12.5 mg Oral BID  . multivitamin  1 tablet Oral BID  . QUEtiapine  12.5 mg Oral QHS   Continuous Infusions: . sodium chloride 1,000 mL (01/06/19 1210)  . ampicillin-sulbactam (UNASYN) IV 1.5 g (01/07/19 0552)     LOS: 3 days   Time Spent in minutes   45 minutes  Anahlia Iseminger D.O. on 01/07/2019 at 9:00 AM  Between 7am to 7pm - Please see pager noted on [email or web address]  After 7pm go to [email or web address]  And look for the night coverage person covering for me after hours  [facility] Office  [phone]

## 2019-01-07 NOTE — TOC Initial Note (Signed)
Transition of Care [facility]) - Initial/Assessment Note    Patient Details  Name: Julie Durham MRN: [id number] Date of Birth: [birth year]  [facility]) CM/SW Contact:    Sharin Mons, RN Phone Number: 01/07/2019, 9:24 AM  Clinical Narrative:             Readmitted with acute encephalopathy,  weakness and diarrhea. From SNF, [place]. Hx of  of cardiomyopathy /Takotsubo, lumbar surgery, recent  tibial fracture.  Pt confused , NCM called and spoke with pt's son Julie Durham about d/c planning.  [name] [facility])     [phone]       NCM received consult for possible SNF placement at time of discharge. NCM spoke with patient's son and shared PT's evaluation/recommendation of SNF placement at time of discharge. Son agreeable for mom to return back to SNF, [place] . Son reported that patient  is currently unable to care for self @ home given patient's current physical needs and fall risk. RNCM discussed insurance authorization process. No further questions reported at this time. NCM to continue to follow and assist with discharge planning needs.   Expected Discharge Plan: [place] Barriers to Discharge: Continued Medical Work up   Patient Goals and CMS Choice Patient states their goals for this hospitalization and ongoing recovery are:: pt confused      Expected Discharge Plan and Services Expected Discharge Plan: [place] Choice: [place] arrangements for the past 2 months: [place]                                      Prior Living Arrangements/Services Living arrangements for the past 2 months: [place]                     Activities of Daily Living      Permission Sought/Granted                  Emotional Assessment              Admission diagnosis:  Metabolic encephalopathy [D32.67] Acute cystitis without  hematuria [N30.00] Acute encephalopathy [G93.40] Diarrhea, unspecified type [R19.7] Patient Active Problem List   Diagnosis Date Noted  . Acute lower UTI 01/04/2019  . Acute cystitis without hematuria   . Acute encephalopathy 01/03/2019  . Protein-calorie malnutrition, severe 12/24/2018  . Acute pain of right knee   . Closed right tibial fracture 12/23/2018  . Mild CAD 10/24/2018  . S/P kyphoplasty 10/24/2018  . Stress fracture of lumbar vertebra 10/24/2018  . Systolic dysfunction 12/45/8099  . Elevated troponin 10/24/2018  . Chest pain  10/24/2018  . Brain mass 10/24/2018  . Takotsubo syndrome   . Lumbar compression fracture ([place]) 10/22/2018  . Hyperlipidemia 02/28/2017  . Oropharyngeal dysphagia 10/04/2016  . Other bursal cyst, left elbow 10/04/2016  . Pill esophagitis 10/04/2016  . Infected insect bite 06/29/2016  . Retained foreign body in soft tissue 06/29/2016  . Essential hypertension 11/18/2012  . Palpitations 11/18/2012   PCP:  Patient, No Pcp Per Pharmacy:  No Pharmacies Listed    Social Determinants of Health (SDOH) Interventions    Readmission Risk Interventions No flowsheet data found.

## 2019-01-07 NOTE — Progress Notes (Signed)
   Vital Signs MEWS/VS Documentation      01/07/2019 1722 01/07/2019 1730 01/07/2019 1745 01/07/2019 1800   MEWS Score:  2  2  3  3    MEWS Score Color:  Yellow  Yellow  Yellow  Yellow   Pulse:  (!) 120  (!) 120  (!) 130  (!) 135   BP:  125/74  127/77  126/77  135/83       Pt is asymptomatic.  After pain medication, pt is comfortable and eating her dinner.  EKG completed and placed in pt's chart.  Vitals taken per unit policy upon changed on MEWS score.  Mikhail MD paged.   Metoprolol iv 2.5mg  given.  Will continue to monitor vitals.   Julianne Handler 01/07/2019,6:09 PM

## 2019-01-07 NOTE — NC FL2 (Signed)
[place] MEDICAID FL2 LEVEL OF CARE SCREENING TOOL     IDENTIFICATION  Patient Name: Julie Durham Birthdate: [birth year] Sex: female Admission Date (Current Location): 01/03/2019  [facility] and [place] Number:  Producer, television/film/video and Address:  [facility], [address] [place], [place] [address]      Provider Number: [id number]  Attending Physician Name and Address:  Edsel Petrin, DO  Relative Name and Phone Number:  Na Waldrip (Son)[phone]  Raenette Rover (Daughter)[phone]    Current Level of Care: Hospital Recommended Level of Care: Skilled Nursing Facility Prior Approval Number:    Date Approved/Denied:   PASRR Number: [id number]  Discharge Plan: SNF    Current Diagnoses: Patient Active Problem List   Diagnosis Date Noted  . Acute lower UTI 01/04/2019  . Acute cystitis without hematuria   . Acute encephalopathy 01/03/2019  . Protein-calorie malnutrition, severe 12/24/2018  . Acute pain of right knee   . Closed right tibial fracture 12/23/2018  . Mild CAD 10/24/2018  . S/P kyphoplasty 10/24/2018  . Stress fracture of lumbar vertebra 10/24/2018  . Systolic dysfunction 10/24/2018  . Elevated troponin 10/24/2018  . Chest pain  10/24/2018  . Brain mass 10/24/2018  . Takotsubo syndrome   . Lumbar compression fracture ([name]) 10/22/2018  . Hyperlipidemia 02/28/2017  . Oropharyngeal dysphagia 10/04/2016  . Other bursal cyst, left elbow 10/04/2016  . Pill esophagitis 10/04/2016  . Infected insect bite 06/29/2016  . Retained foreign body in soft tissue 06/29/2016  . Essential hypertension 11/18/2012  . Palpitations 11/18/2012    Orientation RESPIRATION BLADDER Height & Weight     Self, Place  Normal Incontinent Weight: 38.6 kg Height:  4\' 11"  (149.9 cm)  BEHAVIORAL SYMPTOMS/MOOD NEUROLOGICAL BOWEL NUTRITION STATUS      Incontinent Diet(refer to d/c summary)  AMBULATORY STATUS COMMUNICATION OF NEEDS Skin   Extensive  Assist Verbally Normal                       Personal Care Assistance Level of Assistance  Bathing, Dressing, Feeding Bathing Assistance: Limited assistance Feeding assistance: Independent Dressing Assistance: Limited assistance     Functional Limitations Info  Sight, Speech, Hearing Sight Info: Adequate Hearing Info: Adequate Speech Info: Adequate    SPECIAL CARE FACTORS FREQUENCY  PT (By licensed PT), OT (By licensed OT)     PT Frequency: 5 x / week OT Frequency: 5x /week            Contractures Contractures Info: Not present    Additional Factors Info  Code Status, Allergies Code Status Info: Full Code Allergies Info: Doxycycline Hyclate, Shellfish Allergy, Demerol, Sulfa Antibiotics           Current Medications (01/07/2019):  This is the current hospital active medication list Current Facility-Administered Medications  Medication Dose Route Frequency Provider Last Rate Last Admin  . 0.9 %  sodium chloride infusion   Intravenous PRN 01/09/2019, DO 10 mL/hr at 01/06/19 1210 1,000 mL at 01/06/19 1210  . acetaminophen (TYLENOL) tablet 650 mg  650 mg Oral Q6H PRN 01/08/19, MD       Or  . acetaminophen (TYLENOL) suppository 650 mg  650 mg Rectal Q6H PRN Eduard Clos, MD      . ampicillin-sulbactam (UNASYN) 1.5 g in sodium chloride 0.9 % 100 mL IVPB  1.5 g Intravenous Q8H Eduard Clos D, RPH 200 mL/hr at 01/07/19 0552 1.5 g at 01/07/19 0552  .  aspirin EC tablet 81 mg  81 mg Oral Daily Rise Patience, MD   81 mg at 01/06/19 0919  . atorvastatin (LIPITOR) tablet 10 mg  10 mg Oral q1800 Rise Patience, MD   10 mg at 01/06/19 1647  . Chlorhexidine Gluconate Cloth 2 % PADS 6 each  6 each Topical Daily Cristal Ford, DO   6 each at 01/06/19 [phone]  . enoxaparin (LOVENOX) injection 15 mg  15 mg Subcutaneous Q24H Rise Patience, MD   15 mg at 01/06/19 1648  . hydrALAZINE (APRESOLINE) injection 10 mg  10 mg Intravenous Q4H PRN  Rise Patience, MD      . irbesartan (AVAPRO) tablet 150 mg  150 mg Oral Daily Rise Patience, MD   150 mg at 01/06/19 0920  . LORazepam (ATIVAN) injection 0.5 mg  0.5 mg Intravenous Q6H PRN Cristal Ford, DO      . metoprolol tartrate (LOPRESSOR) tablet 12.5 mg  12.5 mg Oral BID Rise Patience, MD   12.5 mg at 01/06/19 2304  . multivitamin (PROSIGHT) tablet 1 tablet  1 tablet Oral BID Rise Patience, MD   1 tablet at 01/06/19 2304  . ondansetron (ZOFRAN) tablet 4 mg  4 mg Oral Q6H PRN Rise Patience, MD       Or  . ondansetron [facility]) injection 4 mg  4 mg Intravenous Q6H PRN Rise Patience, MD      . oxyCODONE (Oxy IR/ROXICODONE) immediate release tablet 10 mg  10 mg Oral Q12H PRN Cristal Ford, DO   10 mg at 01/07/19 0555  . potassium chloride SA (KLOR-CON) CR tablet 40 mEq  40 mEq Oral Once Cristal Ford, DO      . QUEtiapine (SEROQUEL) tablet 12.5 mg  12.5 mg Oral QHS Mikhail, [place], DO   12.5 mg at 01/06/19 2304     Discharge Medications: Please see discharge summary for a list of discharge medications.  Relevant Imaging Results:  Relevant Lab Results:   Additional Information [id number]  Sharin Mons, RN

## 2019-01-07 NOTE — Progress Notes (Signed)
Hypoglycemic Event  CBG: 57  Treatment:  Dextrose 50%  Iv solution 51ml .  Symptoms: asymptomatic.  Follow-up CBG: Time:1215 CBG Result:122  Possible Reasons for Event: pt refused to eat breakfast.  Comments/MD notified: Ree Kida MD    Julianne Handler

## 2019-01-08 DIAGNOSIS — D649 Anemia, unspecified: Secondary | ICD-10-CM | POA: Diagnosis not present

## 2019-01-08 DIAGNOSIS — M2550 Pain in unspecified joint: Secondary | ICD-10-CM | POA: Diagnosis not present

## 2019-01-08 DIAGNOSIS — G9341 Metabolic encephalopathy: Principal | ICD-10-CM

## 2019-01-08 DIAGNOSIS — R5381 Other malaise: Secondary | ICD-10-CM | POA: Diagnosis not present

## 2019-01-08 DIAGNOSIS — G9340 Encephalopathy, unspecified: Secondary | ICD-10-CM | POA: Diagnosis not present

## 2019-01-08 DIAGNOSIS — Z741 Need for assistance with personal care: Secondary | ICD-10-CM | POA: Diagnosis not present

## 2019-01-08 DIAGNOSIS — I2510 Atherosclerotic heart disease of native coronary artery without angina pectoris: Secondary | ICD-10-CM | POA: Diagnosis not present

## 2019-01-08 DIAGNOSIS — S82101D Unspecified fracture of upper end of right tibia, subsequent encounter for closed fracture with routine healing: Secondary | ICD-10-CM

## 2019-01-08 DIAGNOSIS — R197 Diarrhea, unspecified: Secondary | ICD-10-CM | POA: Diagnosis not present

## 2019-01-08 DIAGNOSIS — H25013 Cortical age-related cataract, bilateral: Secondary | ICD-10-CM | POA: Diagnosis not present

## 2019-01-08 DIAGNOSIS — E43 Unspecified severe protein-calorie malnutrition: Secondary | ICD-10-CM | POA: Diagnosis not present

## 2019-01-08 DIAGNOSIS — S82201D Unspecified fracture of shaft of right tibia, subsequent encounter for closed fracture with routine healing: Secondary | ICD-10-CM | POA: Diagnosis not present

## 2019-01-08 DIAGNOSIS — Z7401 Bed confinement status: Secondary | ICD-10-CM | POA: Diagnosis not present

## 2019-01-08 DIAGNOSIS — M810 Age-related osteoporosis without current pathological fracture: Secondary | ICD-10-CM | POA: Diagnosis not present

## 2019-01-08 DIAGNOSIS — M6281 Muscle weakness (generalized): Secondary | ICD-10-CM | POA: Diagnosis not present

## 2019-01-08 DIAGNOSIS — N390 Urinary tract infection, site not specified: Secondary | ICD-10-CM | POA: Diagnosis not present

## 2019-01-08 DIAGNOSIS — E785 Hyperlipidemia, unspecified: Secondary | ICD-10-CM | POA: Diagnosis not present

## 2019-01-08 DIAGNOSIS — R2689 Other abnormalities of gait and mobility: Secondary | ICD-10-CM | POA: Diagnosis not present

## 2019-01-08 DIAGNOSIS — Z743 Need for continuous supervision: Secondary | ICD-10-CM | POA: Diagnosis not present

## 2019-01-08 LAB — BASIC METABOLIC PANEL
Anion gap: 7 (ref 5–15)
BUN: 11 mg/dL (ref 8–23)
CO2: 28 mmol/L (ref 22–32)
Calcium: 8.8 mg/dL — ABNORMAL LOW (ref 8.9–10.3)
Chloride: 101 mmol/L (ref 98–111)
Creatinine, Ser: 0.59 mg/dL (ref 0.44–1.00)
GFR calc Af Amer: >60 mL/min (ref >60)
GFR calc non Af Amer: >60 mL/min (ref >60)
Glucose, Bld: 89 mg/dL (ref 70–99)
Potassium: 4.1 mmol/L (ref 3.5–5.1)
Sodium: 136 mmol/L (ref 135–145)

## 2019-01-08 LAB — GLUCOSE, CAPILLARY
Glucose-Capillary: 100 mg/dL — ABNORMAL HIGH (ref 70–99)
Glucose-Capillary: 75 mg/dL (ref 70–99)
Glucose-Capillary: 142 mg/dL — ABNORMAL HIGH (ref 70–99)
Glucose-Capillary: 114 mg/dL — ABNORMAL HIGH (ref 70–99)

## 2019-01-08 LAB — CBC
HCT: 34.3 % — ABNORMAL LOW (ref 36.0–46.0)
Hemoglobin: 11.3 g/dL — ABNORMAL LOW (ref 12.0–15.0)
MCH: 30.5 pg (ref 26.0–34.0)
MCHC: 32.9 g/dL (ref 30.0–36.0)
MCV: 92.7 fL (ref 80.0–100.0)
Platelets: 359 10*3/uL (ref 150–400)
RBC: 3.7 MIL/uL — ABNORMAL LOW (ref 3.87–5.11)
RDW: 12.2 % (ref 11.5–15.5)
WBC: 5.2 10*3/uL (ref 4.0–10.5)
nRBC: 0 % (ref 0.0–0.2)

## 2019-01-08 LAB — VITAMIN B1: Vitamin B1 (Thiamine): 139.1 nmol/L (ref 66.5–200.0)

## 2019-01-08 MED ORDER — ENSURE ENLIVE PO LIQD: 237.0000 mL | Freq: Three times a day (TID) | ORAL | Status: DC

## 2019-01-08 MED ORDER — QUETIAPINE FUMARATE 25 MG PO TABS: 12.5000 mg | ORAL_TABLET | Freq: Every day | ORAL | Status: DC

## 2019-01-08 MED ORDER — ACETAMINOPHEN 500 MG PO TABS: 500.0000 mg | ORAL_TABLET | Freq: Four times a day (QID) | ORAL | Status: DC | PRN

## 2019-01-08 MED ORDER — ADULT MULTIVITAMIN W/MINERALS CH
1.0000 | ORAL_TABLET | Freq: Every day | ORAL | Status: DC
  Administered 2019-01-08: 1 via ORAL
  Filled 2019-01-08: qty 1

## 2019-01-08 MED ORDER — OXYCODONE HCL 5 MG PO TABS: 5.0000 mg | ORAL_TABLET | Freq: Two times a day (BID) | ORAL | 0 refills | Status: DC | PRN

## 2019-01-08 MED ORDER — ENSURE ENLIVE PO LIQD
237.0000 mL | Freq: Three times a day (TID) | ORAL | Status: DC
  Administered 2019-01-08 (×2): 237 mL via ORAL

## 2019-01-08 MED ORDER — AMOXICILLIN 500 MG PO TABS: 500.0000 mg | ORAL_TABLET | Freq: Two times a day (BID) | ORAL | Status: DC

## 2019-01-08 NOTE — Discharge Summary (Addendum)
Physician Discharge Summary  Julie Durham UUV:[id number] DOB: [birth year]  PCP: Patient, No Pcp Per  [place]  Admit date: 01/03/2019 Discharge date: 01/08/2019  Recommendations for Outpatient Follow-up:    Contact information for follow-up providers    MD at SNF. Schedule an appointment as soon as possible for a visit.   Why: To be seen in 2 to 3 days with repeat labs (CBC & BMP).  Recommend outpatient urology consultation ASAP for evaluation of Foley dependent acute urinary retention.  Also recommend Orthopedics/Dr. Alucio follow-up regarding recent right tibial fracture.       [facility]. Schedule an appointment as soon as possible for a visit.   Why: Consultation regarding acute urinary retention requiring Foley catheter. Contact information: [place]       Gaynelle Arabian, MD. Schedule an appointment as soon as possible for a visit.   Specialty: Orthopedic Surgery Why: asap regarding follow-up of recent right tibial fracture. Contact information: [address] [place] [address] [phone]        Lorretta Harp, MD. Schedule an appointment as soon as possible for a visit.   Specialties: Cardiology, Radiology Why: Upon discharge from SNF. Contact information: [address] [place] [address] [phone]        Please follow up.   Why: appt. already scheduled thru SNF           Contact information for after-discharge care    Destination    [facility] .   Service: Skilled Nursing Contact information: [address] [place] [phone]                  Recommend monitoring CBGs closely, may be 3 times daily AC initially and then less frequently thereafter due to couple of episodes of hypoglycemic range CBGs in the  hospital.  Recommend repeating EKG biweekly, next in 2 to 3 days from hospital discharge to monitor QTC  Follow delirium precautions at SNF.  Recommend outpatient palliative care consultation to establish goals of care.  Home Health: N/A Equipment/Devices: TBD at SNF.  Discharge Condition: Improved and stable. CODE STATUS: DNR.  This was confirmed with patient's son today. Diet recommendation: Heart healthy diet.  Discharge Diagnoses:  Principal Problem:   Acute encephalopathy Active Problems:   Essential hypertension   Lumbar compression fracture ([name])   Takotsubo syndrome   S/P kyphoplasty   Closed right tibial fracture   Acute lower UTI   Brief Summary: 78 y.o. female with medical history significant of seasonal allergies, normocytic anemia, osteoarthritis, asthma, history of brain mass (see below for details), cataracts, history of viral encephalitis, hyperlipidemia, hypertension, mild mitral regurgitation, mild tricuspid regurgitation, Takotsubo cardiomyopathy, palpitations, mild CAD, recently hospitalized 12/23/2018-12/25/2018 due to closed right tibial fracture sustained after a fall, evaluated by orthopedics, managed nonsurgically, discharged to SNF, now presented to the ED from SNF due to feeling weak and an episode of diarrhea.  Patient not a good historian and appeared confused.  ED course: Afebrile and mildly confused.  CT head unremarkable.  UA suggestive of UTI.  Creatinine 1.5, potassium 3.3, hemoglobin 9.6, COVID-19 testing negative.  Admitted for possible acute encephalopathy possibly from UTI versus pain medications.   Assessment and plan:  1. Altered mental status, possible acute metabolic encephalopathy from suspected catheter associated UTI versus medications versus progressive dementia: CT head without  acute findings but showed chronic atrophy.  Influenza A&B and COVID-19 testing negative.  Urine microscopy showed many bacteria and pyuria but unsure if patient  truly had UTI symptoms, she was afebrile and did not have leukocytosis on admission.  She did have indwelling Foley catheter from SNF.  B12 and folate levels within normal limits.  TSH normal/2.121.  Ammonia normal.  RPR nonreactive.  B1 level normal/139.1.  MRI brain also shows no acute findings.  As per my detailed discussion with patient's son and power of attorney, patient had sustained a fall 3 years back associated with pelvic fractures and since then has been ambulating with the help of cane.  She lived by herself prior to recent hospitalization.  Her mental status has been progressively declining over the last 2 years and more so over the last year.  Her house is a mess.  She has been hoarding stuff at her house.  Her bathtub etc. are full.  She thereby has been bathing out of the sink.  In July or August 2020, early 1 morning patient was up knocking at her neighbor's door stating that somebody had thrown her out of the house and locked her house door which was not the case.  She has had ongoing intermittent confusion, memory impairment, paranoia, visual hallucinations etc.  Her son believes that patient has advanced dementia just like his grandmother who had Alzheimer's.  Her mental status is probably at her baseline.  She seems to get more confused in the evening/sundowning as this afternoon where if she is left by herself she is pleasantly confused but not agitated but when RN went to check CBG or do an EKG, she got agitated.  Complete treatment of suspected UTI as noted below.  Recommend delirium precautions at SNF.  She was newly started on Seroquel 12.5 mg at bedtime which will be continued with close monitoring of EKG for QTC prolongation at SNF.  EKG on 12/29 showed QTC of 537 ms but multiple prior EKGs on 12/17 and October 2020 had normal QTC.  She would not allow [place] to do EKG today.  QTc by telemetry was 397 ms.  Confirmed with patient's son that patient is supposed to be DNR.  Also recommended  outpatient palliative care consultation to establish goals of care which she is agreeable to.  He also indicates that he is pursuing long-term nursing home stay after current rehab because patient will not be able to care for herself and she would not want to stay with her kids. 2. Catheter associated asymptomatic bacteriuria versus Enterococcus UTI: Please see discussion above.  Initially received 2 doses of IV ceftriaxone followed by 4 days of IV Unasyn.  As discussed with pharmacy, will complete total 7 days course with oral amoxicillin on discharge.  Please see catheter management as below. 3. Acute urinary retention: As per RN discussion with SNF today, patient came to the SNF with a Foley catheter on 12/16 due to urinary retention.  She reportedly has not seen a urologist since.  Attempts to discontinue Foley catheter over the weekend failed and Foley catheter had to be replaced.  Recommend outpatient urology consultation for evaluation and measure so that the catheter can be discontinued quickly to avoid recurrent UTI and related complications. 4. Diarrhea: Appears self-limited and resolved.  GI panel PCR negative. 5. Essential hypertension: Controlled on irbesartan and metoprolol. 6. Recent diagnosis of Takotsubo's cardiomyopathy: Continue irbesartan, metoprolol and statins.  TTE 10/23/2018: LVEF 45-50%.  Clinically euvolemic.  Not on  diuretics PTA.  Outpatient follow-up with cardiology. 7. Recent right tibial fracture and kyphoplasty: Continue right knee immobilizer.  Outpatient follow-up with Dr. Lequita Halt with home she was supposed to follow-up around this time but not sure if she has and also has been hospitalized at this time.  Minimize opioids.  I reviewed PDMP and patient last filled tramadol 50 mg tablets, 12 tablets on 09/18/2018. 8. Chronic anemia, suspect anemia of chronic disease: Iron 31, TIBC 231, saturation ratio 13, ferritin 58, folate 20 and B12: 540.  Reticulocytes 67.   Stable. 9. Hypokalemia: Replaced. 10. Constipation: Resolved after tap water enema.  Not sure if she actually had overflow incontinence on admission.  Consider starting a regular bowel regimen at SNF. 11. Hypoglycemia: No reported history of diabetes.  Suspect due to inconsistent and poor oral intake.  Has had intermittent low CBGs.  Recommend monitoring CBGs closely at SNF and treat supportively as needed. 12. Adult failure to thrive: Multifactorial but most importantly due to advanced age, frail physical status, progressive dementia complicating acute medical illnesses as noted above.  As discussed with son today, recommend outpatient palliative care consultation to establish goals of care. 13. Hyperlipidemia: Continue statins. 14. Mild CAD: Asymptomatic of angina.  Continue aspirin, beta-blockers and statins. 15. Severe protein calorie malnutrition: Continue nutritional supplementation. 16. 9 mm enhancing mass below the level of the cerebellar tonsils: Reported on MRI brain.  Outpatient follow-up and evaluation as deemed necessary.  May consider outpatient neurology or neuropsychiatry evaluation regarding further evaluation of this as well as her progressive dementia.  Do not suspect that this mass or a small chronic lacunar infarct within the right cerebellum noted on MRI new since November 2020 contributed to her mental status changes.  Consultations:  None  Procedures:  Continued right knee immobilizer.  Foley catheter reinserted after failed attempts to discontinue.   Discharge Instructions  Discharge Instructions    Call MD for:   Complete by: As directed    Recurrent confusion or worsening mental status changes.   Call MD for:  difficulty breathing, headache or visual disturbances   Complete by: As directed    Call MD for:  extreme fatigue   Complete by: As directed    Call MD for:  persistant dizziness or light-headedness   Complete by: As directed    Call MD for:   persistant nausea and vomiting   Complete by: As directed    Call MD for:  severe uncontrolled pain   Complete by: As directed    Call MD for:  temperature >100.4   Complete by: As directed    Diet - low sodium heart healthy   Complete by: As directed    Increase activity slowly   Complete by: As directed        Medication List    STOP taking these medications   oxyCODONE 10 mg 12 hr tablet Commonly known as: OXYCONTIN Replaced by: oxyCODONE 5 MG immediate release tablet     TAKE these medications   acetaminophen 500 MG tablet Commonly known as: TYLENOL Take 1 tablet (500 mg total) by mouth every 6 (six) hours as needed for mild pain or moderate pain. What changed:   when to take this  reasons to take this   amoxicillin 500 MG tablet Commonly known as: AMOXIL Take 1 tablet (500 mg total) by mouth 2 (two) times daily. Last day of therapy 01/11/19   aspirin EC 81 MG tablet Take 81 mg by mouth daily.  atorvastatin 10 MG tablet Commonly known as: LIPITOR Take 1 tablet (10 mg total) by mouth daily at 6 PM.   Calcium 600+D 600-400 MG-UNIT tablet Generic drug: Calcium Carbonate-Vitamin D Take 1 tablet by mouth 2 (two) times daily.   feeding supplement (ENSURE ENLIVE) Liqd Take 237 mLs by mouth 3 (three) times daily between meals.   irbesartan 150 MG tablet Commonly known as: AVAPRO Take 1 tablet (150 mg total) by mouth daily.   metoprolol tartrate 25 MG tablet Commonly known as: LOPRESSOR TAKE 1/2 TABLET(12.5 MG) BY MOUTH TWICE DAILY What changed: See the new instructions.   Ocuvite Extra Tabs Take 1 tablet by mouth 2 (two) times daily.   oxyCODONE 5 MG immediate release tablet Commonly known as: Oxy IR/ROXICODONE Take 1 tablet (5 mg total) by mouth every 12 (twelve) hours as needed for severe pain. Replaces: oxyCODONE 10 mg 12 hr tablet   QUEtiapine 25 MG tablet Commonly known as: SEROQUEL Take 0.5 tablets (12.5 mg total) by mouth at bedtime.       Allergies  Allergen Reactions  . Doxycycline Hyclate Anaphylaxis and Other (See Comments)    Chest pain  . Shellfish Allergy Anaphylaxis  . Demerol Other (See Comments)    Sedation  . Sulfa Antibiotics Other (See Comments)    Unknown rxn      Procedures/Studies: CT HEAD WO CONTRAST  Result Date: 01/04/2019 CLINICAL DATA:  Encephalopathy EXAM: CT HEAD WITHOUT CONTRAST TECHNIQUE: Contiguous axial images were obtained from the base of the skull through the vertex without intravenous contrast. COMPARISON:  None. FINDINGS: Brain: There is no mass, hemorrhage or extra-axial collection. There is generalized atrophy without lobar predilection. Hypodensity of the white matter is most commonly associated with chronic microvascular disease. Vascular: No abnormal hyperdensity of the major intracranial arteries or dural venous sinuses. No intracranial atherosclerosis. Skull: The visualized skull base, calvarium and extracranial soft tissues are normal. Sinuses/Orbits: No fluid levels or advanced mucosal thickening of the visualized paranasal sinuses. No mastoid or middle ear effusion. The orbits are normal. IMPRESSION: Generalized atrophy and chronic microvascular ischemia without acute intracranial abnormality. Electronically Signed   By: Deatra Robinson M.D.   On: 01/04/2019 03:46   MR BRAIN WO CONTRAST  Result Date: 01/07/2019 CLINICAL DATA:  Encephalopathy. Additional history provided: Patient admitted with acute metabolic encephalopathy thought secondary to UTI versus medication, question underlying dementia. EXAM: MRI HEAD WITHOUT CONTRAST TECHNIQUE: Multiplanar, multiecho pulse sequences of the brain and surrounding structures were obtained without intravenous contrast. COMPARISON:  Head CT 01/04/2019, brain MRI 11/14/2018 FINDINGS: Brain: The examination is intermittently motion degraded. Most notably, there is moderate motion degradation of the coronal acquired T2 weighted sequence. There is no  convincing evidence of acute infarct. No midline shift or extra-axial fluid collection. Redemonstrated chronic microhemorrhage within the right basal ganglia. Moderate scattered and confluent T2/FLAIR hyperintensity within the cerebral white matter is nonspecific, but consistent with chronic small vessel ischemic disease. A small chronic lacunar infarct within the right cerebellum is new from prior MRI. A previously demonstrated 9 mm enhancing mass below the level of the cerebellar tonsils is poorly reassessed on this noncontrast examination. Moderate generalized parenchymal atrophy. Vascular: Flow voids maintained within the proximal large arterial vessels. Skull and upper cervical spine: Redemonstrated congenital versus postsurgical thinning of the parietal calvarium bilaterally. Sinuses/Orbits: Bilateral lens replacements. Minimal ethmoid sinus mucosal thickening. No significant mastoid effusion. IMPRESSION: Intermittently motion degraded examination. No evidence of acute intracranial abnormality. A previously demonstrated 9 mm enhancing mass below the  level of the cerebellar tonsils is poorly reassessed on this noncontrast study. Moderate generalized parenchymal atrophy and chronic small vessel ischemic disease. A small chronic lacunar infarct within the right cerebellum is new since MRI 11/14/2018. Electronically Signed   By: Jackey Loge DO   On: 01/07/2019 11:23   DG Chest Portable 1 View  Result Date: 01/03/2019 CLINICAL DATA:  Altered mental status EXAM: PORTABLE CHEST 1 VIEW COMPARISON:  CT 12/26/2018 radiograph 12/26/2010 FINDINGS: Hyperinflation with coarsened interstitial changes similar to prior. No new consolidative opacity is seen. Some bandlike areas of opacity in the left base favor atelectasis. No pneumothorax or effusion. Apical pleuroparenchymal scarring is similar to prior. The aorta is calcified. The remaining cardiomediastinal contours are unremarkable. The osseous structures appear  diffusely demineralized which may limit detection of small or nondisplaced fractures. Vertebroplasty changes noted in the spine. Multilevel compression deformities appear grossly similar prior though are poorly assessed in the absence of lateral radiograph. IMPRESSION: 1. No acute cardiopulmonary abnormality. 2. Stable chronic interstitial changes, hyperinflation and biapical pleuroparenchymal scarring. 3. Prior vertebroplasty multilevel compression deformities in the spine, poorly assessed in the absence of lateral view. Electronically Signed   By: Kreg Shropshire M.D.   On: 01/03/2019 21:19    Subjective: Patient was interviewed and examined along with nursing tech in the room who was feeding her and patient was tolerating diet well.  She was pleasantly confused and oriented only to self.  As per RN, no acute issues noted.  Patient unable to provide any history.  Discharge Exam:  Vitals:   01/07/19 2200 01/08/19 0445 01/08/19 1014 01/08/19 1056  BP: 127/75 119/75 114/68 133/74  Pulse: (!) 109 89 (!) 102 89  Resp:  Temp:  97.8 F (36.6 C) 98.7 F (37.1 C) 98.8 F (37.1 C)  TempSrc:  Oral Oral Oral  SpO2:  99%  99%  Weight:  39.5 kg    Height:        General: Pleasant elderly female, small built and thinly nourished lying comfortably propped up in bed without distress. Cardiovascular: S1 & S2 heard, RRR, S1/S2 +. No murmurs, rubs, gallops or clicks. No JVD or pedal edema.  Telemetry personally reviewed: Sinus rhythm.  Occasional mild sinus tachycardia in the 100s. Respiratory: Clear to auscultation without wheezing, rhonchi or crackles. No increased work of breathing. Abdominal:  Non distended, non tender & soft. No organomegaly or masses appreciated. Normal bowel sounds heard. CNS: Alert and oriented only to self. No focal deficits. Extremities: no edema, no cyanosis.  Appears to be moving all extremities symmetrically.  Follows simple instructions.  Right leg in immobilizer  splint.    The results of significant diagnostics from this hospitalization (including imaging, microbiology, ancillary and laboratory) are listed below for reference.     Microbiology: Recent Results (from the past 240 hour(s))  GI pathogen panel by PCR, stool     Status: None   Collection Time: 01/03/19  8:52 PM   Specimen: STOOL  Result Value Ref Range Status   Plesiomonas shigelloides NOT DETECTED NOT DETECTED Final   Yersinia enterocolitica NOT DETECTED NOT DETECTED Final   Vibrio NOT DETECTED NOT DETECTED Final   Enteropathogenic E coli NOT DETECTED NOT DETECTED Final   E coli (ETEC) LT/ST NOT DETECTED NOT DETECTED Final   E coli 0157 by PCR Not applicable NOT DETECTED Final   Cryptosporidium by PCR NOT DETECTED NOT DETECTED Final   Entamoeba histolytica NOT DETECTED NOT DETECTED Final  Adenovirus F 40/41 NOT DETECTED NOT DETECTED Final   Norovirus GI/GII NOT DETECTED NOT DETECTED Final   Sapovirus NOT DETECTED NOT DETECTED Final    Comment: (NOTE) Performed At: [facility] [address] [place], [place] [id number] Jolene SchimkeNagendra Sanjai MD ON:[phone]    Vibrio cholerae NOT DETECTED NOT DETECTED Final   Campylobacter by PCR NOT DETECTED NOT DETECTED Final   Salmonella by PCR NOT DETECTED NOT DETECTED Final   E coli (STEC) NOT DETECTED NOT DETECTED Final   Enteroaggregative E coli NOT DETECTED NOT DETECTED Final   Shigella by PCR NOT DETECTED NOT DETECTED Final   Cyclospora cayetanensis NOT DETECTED NOT DETECTED Final   Astrovirus NOT DETECTED NOT DETECTED Final   G lamblia by PCR NOT DETECTED NOT DETECTED Final   Rotavirus A by PCR NOT DETECTED NOT DETECTED Final  Respiratory Panel by RT PCR (Flu A&B, Covid) - Nasopharyngeal Swab     Status: None   Collection Time: 01/03/19 10:22 PM   Specimen: Nasopharyngeal Swab  Result Value Ref Range Status   SARS Coronavirus 2 by RT PCR NEGATIVE NEGATIVE Final    Comment: (NOTE) SARS-CoV-2 target nucleic acids are NOT  DETECTED. The SARS-CoV-2 RNA is generally detectable in upper respiratoy specimens during the acute phase of infection. The lowest concentration of SARS-CoV-2 viral copies this assay can detect is 131 copies/mL. A negative result does not preclude SARS-Cov-2 infection and should not be used as the sole basis for treatment or other patient management decisions. A negative result may occur with  improper specimen collection/handling, submission of specimen other than nasopharyngeal swab, presence of viral mutation(s) within the areas targeted by this assay, and inadequate number of viral copies (<131 copies/mL). A negative result must be combined with clinical observations, patient history, and epidemiological information. The expected result is Negative. Fact Sheet for Patients:  https://www.moore.com/https://www.fda.gov/media/142436/download Fact Sheet for Healthcare Providers:  https://www.young.biz/https://www.fda.gov/media/142435/download This test is not yet ap proved or cleared by the [place] FDA and  has been authorized for detection and/or diagnosis of SARS-CoV-2 by FDA under an Emergency Use Authorization (EUA). This EUA will remain  in effect (meaning this test can be used) for the duration of the COVID-19 declaration under Section 564(b)(1) of the Act, 21 U.S.C. section 360bbb-3(b)(1), unless the authorization is terminated or revoked sooner.    Influenza A by PCR NEGATIVE NEGATIVE Final   Influenza B by PCR NEGATIVE NEGATIVE Final    Comment: (NOTE) The Xpert Xpress SARS-CoV-2/FLU/RSV assay is intended as an aid in  the diagnosis of influenza from Nasopharyngeal swab specimens and  should not be used as a sole basis for treatment. Nasal washings and  aspirates are unacceptable for Xpert Xpress SARS-CoV-2/FLU/RSV  testing. Fact Sheet for Patients: https://www.moore.com/https://www.fda.gov/media/142436/download Fact Sheet for Healthcare Providers: https://www.young.biz/https://www.fda.gov/media/142435/download This test is not yet approved or cleared  by the [place] FDA and  has been authorized for detection and/or diagnosis of SARS-CoV-2 by  FDA under an Emergency Use Authorization (EUA). This EUA will remain  in effect (meaning this test can be used) for the duration of the  Covid-19 declaration under Section 564(b)(1) of the Act, 21  U.S.C. section 360bbb-3(b)(1), unless the authorization is  terminated or revoked. Performed at [facility], [address] [place], [place] [address]   Urine culture     Status: Abnormal   Collection Time: 01/03/19 10:40 PM   Specimen: Urine, Random  Result Value Ref Range Status   Specimen Description URINE, RANDOM  Final  Special Requests   Final    NONE Performed at [facility], [address] [place], [place] [address]    Culture >=100,000 COLONIES/mL ENTEROCOCCUS FAECALIS (A)  Final   Report Status 01/06/2019 FINAL  Final   Organism ID, Bacteria ENTEROCOCCUS FAECALIS (A)  Final      Susceptibility   Enterococcus faecalis - MIC*    AMPICILLIN <=2 SENSITIVE Sensitive     NITROFURANTOIN <=16 SENSITIVE Sensitive     VANCOMYCIN 2 SENSITIVE Sensitive     * >=100,000 COLONIES/mL ENTEROCOCCUS FAECALIS  MRSA PCR Screening     Status: None   Collection Time: 01/04/19  2:55 PM   Specimen: Nasal Mucosa; Nasopharyngeal  Result Value Ref Range Status   MRSA by PCR NEGATIVE NEGATIVE Final    Comment:        The GeneXpert MRSA Assay (FDA approved for NASAL specimens only), is one component of a comprehensive MRSA colonization surveillance program. It is not intended to diagnose MRSA infection nor to guide or monitor treatment for MRSA infections. Performed at [facility], [address], [place], [place] [address]   SARS CORONAVIRUS 2 (TAT 6-24 HRS) Nasopharyngeal Nasopharyngeal Swab     Status: None   Collection Time: 01/07/19  1:52 PM   Specimen: Nasopharyngeal Swab  Result Value Ref Range Status   SARS Coronavirus 2 NEGATIVE NEGATIVE Final    Comment:  (NOTE) SARS-CoV-2 target nucleic acids are NOT DETECTED. The SARS-CoV-2 RNA is generally detectable in upper and lower respiratory specimens during the acute phase of infection. Negative results do not preclude SARS-CoV-2 infection, do not rule out co-infections with other pathogens, and should not be used as the sole basis for treatment or other patient management decisions. Negative results must be combined with clinical observations, patient history, and epidemiological information. The expected result is Negative. Fact Sheet for Patients: HairSlick.no Fact Sheet for Healthcare Providers: quierodirigir.com This test is not yet approved or cleared by the [place] FDA and  has been authorized for detection and/or diagnosis of SARS-CoV-2 by FDA under an Emergency Use Authorization (EUA). This EUA will remain  in effect (meaning this test can be used) for the duration of the COVID-19 declaration under Section 56 4(b)(1) of the Act, 21 U.S.C. section 360bbb-3(b)(1), unless the authorization is terminated or revoked sooner. Performed at [facility], [address] [place], [place] [address]      Labs: CBC: Recent Labs  Lab 01/03/19 2128 01/05/19 0419 01/06/19 0301 01/07/19 0503 01/08/19 0543  WBC 6.0 5.6  --   --  5.2  NEUTROABS 4.7  --   --   --   --   HGB 11.6* 10.7* 10.5* 11.1* 11.3*  HCT 35.6* 32.2* 31.8* 33.1* 34.3*  MCV 95.4 92.8  --   --  92.7  PLT 406* 386  --   --  359    Basic Metabolic Panel: Recent Labs  Lab 01/03/19 2128 01/05/19 0419 01/06/19 0301 01/07/19 0503 01/08/19 0543  NA 142 139 140 141 136  K 3.3* 3.5 3.4* 3.4* 4.1  CL 102 102 102 104 101  CO2 28 25 29 28 28   GLUCOSE 77 46* 100* 82 89  BUN 30* 31* 19 7* 11  CREATININE 0.54 0.74 0.53 0.45 0.59  CALCIUM 9.3 8.9 8.8* 8.6* 8.8*  MG  --   --  2.0 2.0  --     Liver Function Tests: Recent Labs  Lab 01/03/19 2128  01/07/19 0503  AST 21 21  ALT 16 15  ALKPHOS 190* 153*  BILITOT 0.8 <0.1*  PROT 6.1* 4.8*  ALBUMIN 3.1* 2.5*    CBG: Recent Labs  Lab 01/07/19 1702 01/07/19 2153 01/08/19 0601 01/08/19 0627 01/08/19 1059  GLUCAP 72 104* 75 100* 142*    Urinalysis    Component Value Date/Time   COLORURINE AMBER (A) 01/03/2019 2211   APPEARANCEUR CLOUDY (A) 01/03/2019 2211   LABSPEC 1.021 01/03/2019 2211   PHURINE 5.0 01/03/2019 2211   GLUCOSEU NEGATIVE 01/03/2019 2211   HGBUR LARGE (A) 01/03/2019 2211   BILIRUBINUR NEGATIVE 01/03/2019 2211   KETONESUR 20 (A) 01/03/2019 2211   PROTEINUR 30 (A) 01/03/2019 2211   NITRITE NEGATIVE 01/03/2019 2211   LEUKOCYTESUR MODERATE (A) 01/03/2019 2211    I discussed in detail with patient's son, updated care and answered all questions.  He was appreciative of the call.  Time coordinating discharge: 50 minutes  SIGNED:  Marcellus Scott, MD, FACP, [facility]. [facility]  To contact the attending provider between 7A-7P or the covering provider during after hours 7P-7A, please log into the web site [email or web address] and access using universal [place] password for that web site. If you do not have the password, please call the hospital operator.

## 2019-01-08 NOTE — Plan of Care (Signed)
  Problem: Pain Managment: Goal: General experience of comfort will improve Outcome: Progressing   Problem: Safety: Goal: Ability to remain free from injury will improve Outcome: Progressing   

## 2019-01-08 NOTE — Progress Notes (Signed)
Initial Nutrition Assessment  RD working remotely.  DOCUMENTATION CODES:   Underweight  INTERVENTION:   -Ensure Enlive po TID, each supplement provides 350 kcal and 20 grams of protein -MVI with minerals daily  NUTRITION DIAGNOSIS:   Increased nutrient needs related to chronic illness(CAD) as evidenced by estimated needs.  GOAL:   Patient will meet greater than or equal to 90% of their needs  MONITOR:   PO intake, Supplement acceptance, Labs, Weight trends, Skin, I & O's  REASON FOR ASSESSMENT:   Other (Comment)    ASSESSMENT:   Julie Durham is a 78 y.o. female with history of recently diagnosed cardiomyopathy likely from Takotsubo after patient had lumbar surgery in October with EF of 45% and readmitted in December and discharged about 10 days ago after being conservatively treated for tibial fracture was brought to the ER the patient was feeling weak and had an episode of diarrhea.  Patient is not a good historian and appears to be a little bit confused.  Patient otherwise denies any chest pain shortness of breath nausea vomiting.  Pt admitted with acute encephalopathy.   Reviewed I/O's: +350 ml x 24 hours and +237 ml since admission  UOP: 700 ml x 24 hours  Attempted to speak with pt via phone, however, no answer. Per chart review, pt is confused and unable to obtain further history. Per RN notes, pt also with intermittent agitation and confusion.   Pt's intake has been variable since admission. Noted meal completion 30-100%. Per previous RD note from prior admission on 12/24/18, pt typically grazes throughout the day. She has a history of severe malnutrition in the context of chronic illness.   Reviewed wt hx, which reveals wt gain over the past year, which is favorable given history of malnutrition. Noted pt has experienced a 39% wt gain over the past month. Suspect malnutrition is ongoing, however, unable to identify at this time without further history and  completion of nutrition-focused physical exam.   Pt would greatly benefit addition of oral nutritional supplements.   Labs reviewed: CBGS: 75-104.   Diet Order:   Diet Order            Diet Heart Room service appropriate? Yes; Fluid consistency: Thin  Diet effective now              EDUCATION NEEDS:   No education needs have been identified at this time  Skin:  Skin Assessment: Skin Integrity Issues: Skin Integrity Issues:: Other (Comment) Other: MASD rt and lt perineum  Last BM:  01/06/19  Height:   Ht Readings from Last 1 Encounters:  01/04/19 4\' 11"  (1.499 m)    Weight:   Wt Readings from Last 1 Encounters:  01/08/19 39.5 kg    Ideal Body Weight:  44.5 kg  BMI:  Body mass index is 17.57 kg/m.  Estimated Nutritional Needs:   Kcal:  1600-1800  Protein:  80-95 grams  Fluid:  > 1.6 L    Alfreida Steffenhagen A. Jimmye Norman, RD, LDN, [place] Registered Dietitian II Certified Diabetes Care and Education Specialist Pager: [phone] After hours Pager: [phone]

## 2019-01-08 NOTE — Care Management Important Message (Signed)
Important Message  Patient Details  Name: Julie Durham MRN: [id number] Date of Birth: 06-12-1940   Medicare Important Message Given:  Yes     [name] 01/08/2019, 12:57 PM

## 2019-01-08 NOTE — Progress Notes (Signed)
Patient refused to allow NT and myself to check her blood sugar & perform EKG on her. MD aware.

## 2019-01-08 NOTE — TOC Transition Note (Signed)
Transition of Care [facility]) - CM/SW Discharge Note   Patient Details  Name: Julie Durham MRN: [id number] Date of Birth: [birth year]  [facility] of [facility]) CM/SW Contact:  Alberteen Sam, LCSW Phone Number: 01/08/2019, 4:31 PM   Clinical Narrative:     Patient will DC to: [place] Anticipated DC date: 01/08/2019 Family notified:David Transport JO:ACZY  Per MD patient ready for DC to . RN, patient, patient's family, and facility notified of DC. Discharge Summary sent to facility. RN given number for report   [id number] Room 103. DC packet on chart. Ambulance transport requested for patient.  CSW signing off.  [place], [place]   Final next level of care: Skilled Nursing Facility Barriers to Discharge: No Barriers Identified   Patient Goals and CMS Choice Patient states their goals for this hospitalization and ongoing recovery are:: pt confused CMS Medicare.gov Compare Post Acute Care list provided to:: Patient Represenative (must comment)(son [name]) Choice offered to / list presented to : Adult Children  Discharge Placement PASRR number recieved: 01/07/19            Patient chooses bed at: Other - please specify in the comment section below:([place]) Patient to be transferred to facility by: [facility] Name of family member notified: [name] (son) Patient and family notified of of transfer: 01/08/19  Discharge Plan and Services     Post Acute Care Choice: [place]                               Social Determinants of Health (SDOH) Interventions     Readmission Risk Interventions No flowsheet data found.

## 2019-01-08 NOTE — Plan of Care (Signed)
  Problem: Urinary Elimination: Goal: Signs and symptoms of infection will decrease Outcome: Adequate for Discharge   Problem: Education: Goal: Knowledge of General Education information will improve Description: Including pain rating scale, medication(s)/side effects and non-pharmacologic comfort measures Outcome: Adequate for Discharge   Problem: Health Behavior/Discharge Planning: Goal: Ability to manage health-related needs will improve Outcome: Adequate for Discharge   Problem: Clinical Measurements: Goal: Ability to maintain clinical measurements within normal limits will improve Outcome: Adequate for Discharge Goal: Will remain free from infection Outcome: Adequate for Discharge Goal: Diagnostic test results will improve Outcome: Adequate for Discharge Goal: Respiratory complications will improve Outcome: Adequate for Discharge Goal: Cardiovascular complication will be avoided Outcome: Adequate for Discharge   Problem: Activity: Goal: Risk for activity intolerance will decrease Outcome: Adequate for Discharge   Problem: Nutrition: Goal: Adequate nutrition will be maintained Outcome: Adequate for Discharge   Problem: Coping: Goal: Level of anxiety will decrease Outcome: Adequate for Discharge   Problem: Elimination: Goal: Will not experience complications related to bowel motility Outcome: Adequate for Discharge Goal: Will not experience complications related to urinary retention Outcome: Adequate for Discharge   Problem: Pain Managment: Goal: General experience of comfort will improve Outcome: Adequate for Discharge   Problem: Safety: Goal: Ability to remain free from injury will improve Outcome: Adequate for Discharge   Problem: Skin Integrity: Goal: Risk for impaired skin integrity will decrease Outcome: Adequate for Discharge   

## 2019-01-08 NOTE — Progress Notes (Signed)
Pharmacy Antibiotic Note  Julie Durham is a 78 y.o. female admitted on 01/03/2019 with UTI.  Pharmacy has been consulted for Unasyn dosing.  ID: UTI, Abx day #4  Ceftriaxone 12/25>>12/26 Unasyn 12/27 >>  12/25: Enterococcus faecalis > 100,000 colonies,  patient experiencing AMS and MD would like to treat. 12/25: GI panel: negative  Plan: Unasyn 1.5g IV q [place] will sign off. Please reconsult for further dosing assitance.    Height: 4\' 11"  (149.9 cm) Weight: 87 lb (39.5 kg) IBW/kg (Calculated) : 43.2  Temp (24hrs), Avg:98.7 F (37.1 C), Min:97.8 F (36.6 C), Max:99.3 F (37.4 C)  Recent Labs  Lab 01/03/19 2128 01/05/19 0419 01/06/19 0301 01/07/19 0503 01/08/19 0543  WBC 6.0 5.6  --   --  5.2  CREATININE 0.54 0.74 0.53 0.45 0.59    Estimated Creatinine Clearance: 35.6 mL/min (by C-G formula based on SCr of 0.59 mg/dL).    Allergies  Allergen Reactions  . Doxycycline Hyclate Anaphylaxis and Other (See Comments)    Chest pain  . Shellfish Allergy Anaphylaxis  . Demerol Other (See Comments)    Sedation  . Sulfa Antibiotics Other (See Comments)    Unknown rxn    Jadwiga Faidley S. Alford Highland, PharmD, BCPS Clinical Staff Pharmacist [email or web address] [place] 01/08/2019 1:42 PM

## 2019-01-08 NOTE — Discharge Instructions (Signed)

## 2019-01-09 NOTE — Plan of Care (Signed)
  Problem: Safety: Goal: Ability to remain free from injury will improve Outcome: Progressing   

## 2019-01-09 NOTE — Progress Notes (Signed)
Pt. Discharged to SNF via [facility]. Pt. Alert and stable. No distress noted. Report called to [place] at [facility].

## 2019-01-10 DIAGNOSIS — D649 Anemia, unspecified: Secondary | ICD-10-CM | POA: Diagnosis not present

## 2019-01-10 DIAGNOSIS — G9340 Encephalopathy, unspecified: Secondary | ICD-10-CM | POA: Diagnosis not present

## 2019-01-10 DIAGNOSIS — H25013 Cortical age-related cataract, bilateral: Secondary | ICD-10-CM | POA: Diagnosis not present

## 2019-01-10 DIAGNOSIS — M810 Age-related osteoporosis without current pathological fracture: Secondary | ICD-10-CM | POA: Diagnosis not present

## 2019-01-10 DIAGNOSIS — S82124D Nondisplaced fracture of lateral condyle of right tibia, subsequent encounter for closed fracture with routine healing: Secondary | ICD-10-CM | POA: Diagnosis not present

## 2019-01-10 DIAGNOSIS — E559 Vitamin D deficiency, unspecified: Secondary | ICD-10-CM | POA: Diagnosis not present

## 2019-01-10 DIAGNOSIS — J449 Chronic obstructive pulmonary disease, unspecified: Secondary | ICD-10-CM | POA: Diagnosis not present

## 2019-01-10 DIAGNOSIS — Z741 Need for assistance with personal care: Secondary | ICD-10-CM | POA: Diagnosis not present

## 2019-01-10 DIAGNOSIS — R0789 Other chest pain: Secondary | ICD-10-CM | POA: Diagnosis not present

## 2019-01-10 DIAGNOSIS — I10 Essential (primary) hypertension: Secondary | ICD-10-CM | POA: Diagnosis not present

## 2019-01-10 DIAGNOSIS — E785 Hyperlipidemia, unspecified: Secondary | ICD-10-CM | POA: Diagnosis not present

## 2019-01-10 DIAGNOSIS — R2689 Other abnormalities of gait and mobility: Secondary | ICD-10-CM | POA: Diagnosis not present

## 2019-01-10 DIAGNOSIS — S82201D Unspecified fracture of shaft of right tibia, subsequent encounter for closed fracture with routine healing: Secondary | ICD-10-CM | POA: Diagnosis not present

## 2019-01-10 DIAGNOSIS — I2510 Atherosclerotic heart disease of native coronary artery without angina pectoris: Secondary | ICD-10-CM | POA: Diagnosis not present

## 2019-01-10 DIAGNOSIS — E43 Unspecified severe protein-calorie malnutrition: Secondary | ICD-10-CM | POA: Diagnosis not present

## 2019-01-10 DIAGNOSIS — R339 Retention of urine, unspecified: Secondary | ICD-10-CM | POA: Diagnosis not present

## 2019-01-10 DIAGNOSIS — I5181 Takotsubo syndrome: Secondary | ICD-10-CM | POA: Diagnosis not present

## 2019-01-10 DIAGNOSIS — R488 Other symbolic dysfunctions: Secondary | ICD-10-CM | POA: Diagnosis not present

## 2019-01-10 DIAGNOSIS — N390 Urinary tract infection, site not specified: Secondary | ICD-10-CM | POA: Diagnosis not present

## 2019-01-10 DIAGNOSIS — Z79899 Other long term (current) drug therapy: Secondary | ICD-10-CM | POA: Diagnosis not present

## 2019-01-10 DIAGNOSIS — E039 Hypothyroidism, unspecified: Secondary | ICD-10-CM | POA: Diagnosis not present

## 2019-01-10 DIAGNOSIS — R410 Disorientation, unspecified: Secondary | ICD-10-CM | POA: Diagnosis not present

## 2019-01-10 DIAGNOSIS — M6281 Muscle weakness (generalized): Secondary | ICD-10-CM | POA: Diagnosis not present

## 2019-01-13 DIAGNOSIS — N390 Urinary tract infection, site not specified: Secondary | ICD-10-CM | POA: Diagnosis not present

## 2019-01-13 DIAGNOSIS — I5181 Takotsubo syndrome: Secondary | ICD-10-CM | POA: Diagnosis not present

## 2019-01-13 DIAGNOSIS — G9340 Encephalopathy, unspecified: Secondary | ICD-10-CM | POA: Diagnosis not present

## 2019-01-13 DIAGNOSIS — R339 Retention of urine, unspecified: Secondary | ICD-10-CM | POA: Diagnosis not present

## 2019-01-20 DIAGNOSIS — E039 Hypothyroidism, unspecified: Secondary | ICD-10-CM | POA: Diagnosis not present

## 2019-01-20 DIAGNOSIS — Z79899 Other long term (current) drug therapy: Secondary | ICD-10-CM | POA: Diagnosis not present

## 2019-01-20 DIAGNOSIS — E559 Vitamin D deficiency, unspecified: Secondary | ICD-10-CM | POA: Diagnosis not present

## 2019-01-20 DIAGNOSIS — D649 Anemia, unspecified: Secondary | ICD-10-CM | POA: Diagnosis not present

## 2019-01-20 DIAGNOSIS — E785 Hyperlipidemia, unspecified: Secondary | ICD-10-CM | POA: Diagnosis not present

## 2019-01-20 DIAGNOSIS — I10 Essential (primary) hypertension: Secondary | ICD-10-CM | POA: Diagnosis not present

## 2019-01-22 DIAGNOSIS — S82124D Nondisplaced fracture of lateral condyle of right tibia, subsequent encounter for closed fracture with routine healing: Secondary | ICD-10-CM | POA: Diagnosis not present

## 2019-01-27 DIAGNOSIS — S82201D Unspecified fracture of shaft of right tibia, subsequent encounter for closed fracture with routine healing: Secondary | ICD-10-CM | POA: Diagnosis not present

## 2019-01-27 DIAGNOSIS — N390 Urinary tract infection, site not specified: Secondary | ICD-10-CM | POA: Diagnosis not present

## 2019-01-27 DIAGNOSIS — R2689 Other abnormalities of gait and mobility: Secondary | ICD-10-CM | POA: Diagnosis not present

## 2019-01-27 DIAGNOSIS — M6281 Muscle weakness (generalized): Secondary | ICD-10-CM | POA: Diagnosis not present

## 2019-01-28 DIAGNOSIS — E785 Hyperlipidemia, unspecified: Secondary | ICD-10-CM | POA: Diagnosis not present

## 2019-01-28 DIAGNOSIS — S82201A Unspecified fracture of shaft of right tibia, initial encounter for closed fracture: Secondary | ICD-10-CM | POA: Diagnosis not present

## 2019-01-28 DIAGNOSIS — R5381 Other malaise: Secondary | ICD-10-CM | POA: Diagnosis not present

## 2019-01-28 DIAGNOSIS — I10 Essential (primary) hypertension: Secondary | ICD-10-CM | POA: Diagnosis not present

## 2019-01-30 DIAGNOSIS — R5381 Other malaise: Secondary | ICD-10-CM | POA: Diagnosis not present

## 2019-01-30 DIAGNOSIS — E785 Hyperlipidemia, unspecified: Secondary | ICD-10-CM | POA: Diagnosis not present

## 2019-01-30 DIAGNOSIS — R338 Other retention of urine: Secondary | ICD-10-CM | POA: Diagnosis not present

## 2019-01-30 DIAGNOSIS — I10 Essential (primary) hypertension: Secondary | ICD-10-CM | POA: Diagnosis not present

## 2019-01-30 DIAGNOSIS — S82201A Unspecified fracture of shaft of right tibia, initial encounter for closed fracture: Secondary | ICD-10-CM | POA: Diagnosis not present

## 2019-01-31 ENCOUNTER — Other Ambulatory Visit: Payer: Self-pay

## 2019-01-31 ENCOUNTER — Ambulatory Visit (INDEPENDENT_AMBULATORY_CARE_PROVIDER_SITE_OTHER): Payer: Medicare Other | Admitting: Cardiovascular Disease

## 2019-01-31 ENCOUNTER — Encounter: Payer: Self-pay | Admitting: Cardiovascular Disease

## 2019-01-31 ENCOUNTER — Telehealth: Payer: Self-pay

## 2019-01-31 VITALS — BP 183/87 | HR 77 | Temp 96.9°F | Ht 59.0 in | Wt 87.0 lb

## 2019-01-31 DIAGNOSIS — I159 Secondary hypertension, unspecified: Secondary | ICD-10-CM

## 2019-01-31 DIAGNOSIS — I519 Heart disease, unspecified: Secondary | ICD-10-CM

## 2019-01-31 DIAGNOSIS — I10 Essential (primary) hypertension: Secondary | ICD-10-CM | POA: Diagnosis not present

## 2019-01-31 DIAGNOSIS — R2689 Other abnormalities of gait and mobility: Secondary | ICD-10-CM | POA: Diagnosis not present

## 2019-01-31 DIAGNOSIS — N390 Urinary tract infection, site not specified: Secondary | ICD-10-CM | POA: Diagnosis not present

## 2019-01-31 DIAGNOSIS — M6281 Muscle weakness (generalized): Secondary | ICD-10-CM | POA: Diagnosis not present

## 2019-01-31 DIAGNOSIS — E782 Mixed hyperlipidemia: Secondary | ICD-10-CM

## 2019-01-31 DIAGNOSIS — I2510 Atherosclerotic heart disease of native coronary artery without angina pectoris: Secondary | ICD-10-CM

## 2019-01-31 DIAGNOSIS — S82201D Unspecified fracture of shaft of right tibia, subsequent encounter for closed fracture with routine healing: Secondary | ICD-10-CM | POA: Diagnosis not present

## 2019-01-31 MED ORDER — AMLODIPINE BESYLATE 5 MG PO TABS: 5.0000 mg | ORAL_TABLET | Freq: Every day | ORAL | 3 refills | Status: DC

## 2019-01-31 NOTE — Telephone Encounter (Signed)
Had appt today with Dr. Allyson Sabal and pt was very confused and did not understand the new orders that Dr. Allyson Sabal given her. Called pt son to make him aware of the new orders and of pt status of being confused. Pt son stated that she was hospitalized back in 12/2018 and was diagnosed with Dementia. Recommended that pt son come to future appts with pt or to get someone from the facility to come with her. Pt son verbalized understanding and thanked this nurse for the follow-up call.

## 2019-01-31 NOTE — Assessment & Plan Note (Addendum)
History of essential hypertension a blood pressure measured today in the office of 180/91.  She is on Avapro and metoprolol.  Her blood pressures have been running high.  We will add amlodipine 5 mg a day.  We will arrange for her to come back in 1 month for follow-up blood pressure with Kristen in the hypertension clinic .She  apparently was on amlodipine and hydralazine in the past.

## 2019-01-31 NOTE — Assessment & Plan Note (Signed)
History of hyperlipidemia on statin therapy with lipid profile performed 10/24/2018 revealing total cholesterol of 54, LDL 65 and HDL 54.

## 2019-01-31 NOTE — Assessment & Plan Note (Signed)
History of mild nonobstructive CAD by cardiac catheterization performed by Dr. Excell Seltzer 10/22/2018.  She did did have moderate LV dysfunction at that time with an EF in the 40 to 45% range thought to be related to "Takotsubo cardiomyopathy".

## 2019-01-31 NOTE — Patient Instructions (Signed)
Medication Instructions:  Start taking 5mg  Amlodipine Daily   If you need a refill on your cardiac medications before your next appointment, please call your pharmacy.   Lab work: NONE  Testing/Procedures: Your physician has requested that you have an echocardiogram in 3 months. Echocardiography is a painless test that uses sound waves to create images of your heart. It provides your doctor with information about the size and shape of your heart and how well your heart's chambers and valves are working. This procedure takes approximately one hour. There are no restrictions for this procedure. [address]   Follow-Up: At [address], you and your health needs are our priority.  As part of our continuing mission to provide you with exceptional heart care, we have created designated Provider Care Teams.  These Care Teams include your primary Cardiologist (physician) and Advanced Practice Providers (APPs -  Physician Assistants and Nurse Practitioners) who all work together to provide you with the care you need, when you need it. You may see [employer], MD or one of the following Advanced Practice Providers on your designated Care Team:    Nanetta Batty, PA-C  [place], [place]  [place], Edd Fabian  Your physician wants you to follow-up in: 1 month with [place] in [facility] to check your blood pressure Your physician wants you to follow-up in: 6 months with Belenda Cruise, FNP Your physician wants you to follow-up in: 1 year with Dr. Edd Fabian

## 2019-01-31 NOTE — Progress Notes (Signed)
01/31/2019 Julie Durham   [birth year]  [id number]  Primary Physician Patient, No Pcp Per Primary Cardiologist: Runell Gess MD Nicholes Calamity, [place]  HPI:  Julie Durham is a 79 y.o.  thin and fit appearing, Caucasian female mother of 2, grandmother to 2 grandchildren who has worked in [employer] department of [employer] for the last 23 years and she recently retired.. I saw herin the office  02/28/2017.  She did see Edd Fabian, FNP in the office 12/09/2018.Marland Kitchen Her problems include mild hyperlipidemia, hypertension and a strong family history for heart disease. Her father died of an MI at age 72, and 2 brothers had MIs in their 15s and 72s, as well as a sister who had stents. She has never had a heart attack or stroke and denies chest pain or shortness of breath. She had a Myoview performed a year ago which was normal, and Dopplers involving her renal arteries, carotids and lower extremities all of which were normal as well. Since I saw her back a year ago she's had 4-5 episodes of brief tachypalpitations which either spontaneously resolve resolve with taking a when necessary metoprolol.. She has not taken her hydrochlorothiazide or low-dose amlodipine since August and feels currently better.Since I saw her last she has fallen down while on vacation in [place] injuring her hip, fracturing several ribs and her foot. These have all resolved in the last several months.  Since I saw her 2 years ago she was hospitalized 10/22/2018 for a kyphoplasty procedure complicated by chest pain, positive troponins and a cardiac catheterization performed Dr. Excell Seltzer revealing nonobstructive CAD with LV dysfunction thought to be related to "Takotsubo cardiomyopathy".  She is currently in a skilled nursing facility.  She is minimally ambulatory because of pain in her right leg and appears very frail compared to her last visit with me   Current Meds  Medication Sig  . acetaminophen (TYLENOL) 500  MG tablet Take 1 tablet (500 mg total) by mouth every 6 (six) hours as needed for mild pain or moderate pain.  Marland Kitchen aspirin EC 81 MG tablet Take 81 mg by mouth daily.   Marland Kitchen atorvastatin (LIPITOR) 10 MG tablet Take 1 tablet (10 mg total) by mouth daily at 6 PM.  . Calcium Carbonate-Vitamin D (CALCIUM 600+D) 600-400 MG-UNIT tablet Take 1 tablet by mouth 2 (two) times daily.  . feeding supplement, ENSURE ENLIVE, (ENSURE ENLIVE) LIQD Take 237 mLs by mouth 3 (three) times daily between meals.  . irbesartan (AVAPRO) 150 MG tablet Take 1 tablet (150 mg total) by mouth daily.  . metoprolol tartrate (LOPRESSOR) 25 MG tablet TAKE 1/2 TABLET(12.5 MG) BY MOUTH TWICE DAILY (Patient taking differently: Take 12.5 mg by mouth daily. )  . Multiple Vitamins-Minerals (OCUVITE EXTRA) TABS Take 1 tablet by mouth 2 (two) times daily.  Marland Kitchen oxyCODONE (OXY IR/ROXICODONE) 5 MG immediate release tablet Take 1 tablet (5 mg total) by mouth every 12 (twelve) hours as needed for severe pain.  Marland Kitchen QUEtiapine (SEROQUEL) 25 MG tablet Take 0.5 tablets (12.5 mg total) by mouth at bedtime.  . rosuvastatin (CRESTOR) 5 MG tablet Take 5 mg by mouth daily.  . [DISCONTINUED] amoxicillin (AMOXIL) 500 MG tablet Take 1 tablet (500 mg total) by mouth 2 (two) times daily. Last day of therapy 01/11/19     Allergies  Allergen Reactions  . Doxycycline Hyclate Anaphylaxis and Other (See Comments)    Chest pain  . Shellfish Allergy Anaphylaxis  . Demerol Other (  See Comments)    Sedation  . Sulfa Antibiotics Other (See Comments)    Unknown rxn    Social History   Socioeconomic History  . Marital status: Legally Separated    Spouse name: Not on file  . Number of children: Not on file  . Years of education: Not on file  . Highest education level: Not on file  Occupational History  . Not on file  Tobacco Use  . Smoking status: Never Smoker  . Smokeless tobacco: Never Used  Substance and Sexual Activity  . Alcohol use: Never  . Drug use:  Never  . Sexual activity: Not on file  Other Topics Concern  . Not on file  Social History Narrative  . Not on file   Social Determinants of Health   Financial Resource Strain:   . Difficulty of Paying Living Expenses: Not on file  Food Insecurity:   . Worried About Charity fundraiser in the Last Year: Not on file  . Ran Out of Food in the Last Year: Not on file  Transportation Needs:   . Lack of Transportation (Medical): Not on file  . Lack of Transportation (Non-Medical): Not on file  Physical Activity:   . Days of Exercise per Week: Not on file  . Minutes of Exercise per Session: Not on file  Stress:   . Feeling of Stress : Not on file  Social Connections:   . Frequency of Communication with Friends and Family: Not on file  . Frequency of Social Gatherings with Friends and Family: Not on file  . Attends Religious Services: Not on file  . Active Member of Clubs or Organizations: Not on file  . Attends Archivist Meetings: Not on file  . Marital Status: Not on file  Intimate Partner Violence:   . Fear of Current or Ex-Partner: Not on file  . Emotionally Abused: Not on file  . Physically Abused: Not on file  . Sexually Abused: Not on file     Review of Systems: General: negative for chills, fever, night sweats or weight changes.  Cardiovascular: negative for chest pain, dyspnea on exertion, edema, orthopnea, palpitations, paroxysmal nocturnal dyspnea or shortness of breath Dermatological: negative for rash Respiratory: negative for cough or wheezing Urologic: negative for hematuria Abdominal: negative for nausea, vomiting, diarrhea, bright red blood per rectum, melena, or hematemesis Neurologic: negative for visual changes, syncope, or dizziness All other systems reviewed and are otherwise negative except as noted above.    Blood pressure (!) 183/87, pulse 77, temperature (!) 96.9 F (36.1 C), height 4\' 11"  (1.499 m), weight 87 lb (39.5 kg), SpO2 98 %.    General appearance: Somewhat confused and forgetful today Neck: no adenopathy, no carotid bruit, no JVD, supple, symmetrical, trachea midline and thyroid not enlarged, symmetric, no tenderness/mass/nodules Lungs: clear to auscultation bilaterally Heart: regular rate and rhythm, S1, S2 normal, no murmur, click, rub or gallop Extremities: extremities normal, atraumatic, no cyanosis or edema Pulses: 2+ and symmetric Skin: Skin color, texture, turgor normal. No rashes or lesions Neurologic: Alert and oriented X 3, normal strength and tone. Normal symmetric reflexes. Normal coordination and gait  EKG not performed today  ASSESSMENT AND PLAN:   Essential hypertension History of essential hypertension a blood pressure measured today in the office of 180/91.  She is on Avapro and metoprolol.  Her blood pressures have been running high.  We will add amlodipine 5 mg a day.  We will arrange for her to come  back in 1 month for follow-up blood pressure with Kristen in the hypertension clinic .She  apparently was on amlodipine and hydralazine in the past.  Hyperlipidemia History of hyperlipidemia on statin therapy with lipid profile performed 10/24/2018 revealing total cholesterol of 54, LDL 65 and HDL 54.  Mild CAD History of mild nonobstructive CAD by cardiac catheterization performed by Dr. Excell Seltzer 10/22/2018.  She did did have moderate LV dysfunction at that time with an EF in the 40 to 45% range thought to be related to "Takotsubo cardiomyopathy".  Systolic dysfunction History of nonischemic cardiomyopathy with an EF in the range by 2D echo during October hospitalization last year thought to be related to "Takatsubo -" cardiomyopathy.  We will recheck a 2D echo in 3 months.      Runell Gess MD FACP,FACC,FAHA, [facility] 01/31/2019 9:43 AM

## 2019-01-31 NOTE — Assessment & Plan Note (Signed)
History of nonischemic cardiomyopathy with an EF in the range by 2D echo during October hospitalization last year thought to be related to "Takatsubo -" cardiomyopathy.  We will recheck a 2D echo in 3 months.

## 2019-02-06 DIAGNOSIS — M1611 Unilateral primary osteoarthritis, right hip: Secondary | ICD-10-CM | POA: Diagnosis not present

## 2019-02-06 DIAGNOSIS — M85861 Other specified disorders of bone density and structure, right lower leg: Secondary | ICD-10-CM | POA: Diagnosis not present

## 2019-02-07 DIAGNOSIS — H353233 Exudative age-related macular degeneration, bilateral, with inactive scar: Secondary | ICD-10-CM | POA: Diagnosis not present

## 2019-02-09 DIAGNOSIS — D649 Anemia, unspecified: Secondary | ICD-10-CM | POA: Diagnosis not present

## 2019-02-09 DIAGNOSIS — E785 Hyperlipidemia, unspecified: Secondary | ICD-10-CM | POA: Diagnosis not present

## 2019-02-09 DIAGNOSIS — J45909 Unspecified asthma, uncomplicated: Secondary | ICD-10-CM | POA: Diagnosis not present

## 2019-02-09 DIAGNOSIS — I10 Essential (primary) hypertension: Secondary | ICD-10-CM | POA: Diagnosis not present

## 2019-02-10 ENCOUNTER — Other Ambulatory Visit: Payer: Self-pay

## 2019-02-10 DIAGNOSIS — R5381 Other malaise: Secondary | ICD-10-CM | POA: Diagnosis not present

## 2019-02-10 DIAGNOSIS — I5181 Takotsubo syndrome: Secondary | ICD-10-CM | POA: Diagnosis not present

## 2019-02-10 DIAGNOSIS — I10 Essential (primary) hypertension: Secondary | ICD-10-CM | POA: Diagnosis not present

## 2019-02-10 DIAGNOSIS — S82201A Unspecified fracture of shaft of right tibia, initial encounter for closed fracture: Secondary | ICD-10-CM | POA: Diagnosis not present

## 2019-02-10 NOTE — Patient Outreach (Signed)
[facility]) Care Management  02/10/2019  Julie Durham Aug 22, 1940 [id number]   Medication Adherence call to Julie Durham Telephone call to Patient regarding Medication Adherence unable to reach patient. [name] did not answer patient is showing past due on Rosuvastatin 5 mg and Irbesartan 150 mg under [employer].   Lillia Abed CPhT Pharmacy Technician [facility] Management Direct Dial [phone]  Fax [phone] [email or web address]

## 2019-02-12 ENCOUNTER — Ambulatory Visit: Payer: Medicare Other | Admitting: Cardiovascular Disease

## 2019-02-13 DIAGNOSIS — S82234D Nondisplaced oblique fracture of shaft of right tibia, subsequent encounter for closed fracture with routine healing: Secondary | ICD-10-CM | POA: Diagnosis not present

## 2019-02-21 DIAGNOSIS — R293 Abnormal posture: Secondary | ICD-10-CM | POA: Diagnosis not present

## 2019-02-21 DIAGNOSIS — M6281 Muscle weakness (generalized): Secondary | ICD-10-CM | POA: Diagnosis not present

## 2019-02-21 DIAGNOSIS — J449 Chronic obstructive pulmonary disease, unspecified: Secondary | ICD-10-CM | POA: Diagnosis not present

## 2019-02-21 DIAGNOSIS — R1319 Other dysphagia: Secondary | ICD-10-CM | POA: Diagnosis not present

## 2019-02-21 DIAGNOSIS — S82201D Unspecified fracture of shaft of right tibia, subsequent encounter for closed fracture with routine healing: Secondary | ICD-10-CM | POA: Diagnosis not present

## 2019-02-21 DIAGNOSIS — Z741 Need for assistance with personal care: Secondary | ICD-10-CM | POA: Diagnosis not present

## 2019-02-21 DIAGNOSIS — E43 Unspecified severe protein-calorie malnutrition: Secondary | ICD-10-CM | POA: Diagnosis not present

## 2019-02-21 DIAGNOSIS — R2689 Other abnormalities of gait and mobility: Secondary | ICD-10-CM | POA: Diagnosis not present

## 2019-02-24 DIAGNOSIS — S82201D Unspecified fracture of shaft of right tibia, subsequent encounter for closed fracture with routine healing: Secondary | ICD-10-CM | POA: Diagnosis not present

## 2019-02-24 DIAGNOSIS — M6281 Muscle weakness (generalized): Secondary | ICD-10-CM | POA: Diagnosis not present

## 2019-02-24 DIAGNOSIS — Z741 Need for assistance with personal care: Secondary | ICD-10-CM | POA: Diagnosis not present

## 2019-02-24 DIAGNOSIS — J449 Chronic obstructive pulmonary disease, unspecified: Secondary | ICD-10-CM | POA: Diagnosis not present

## 2019-02-24 DIAGNOSIS — R2689 Other abnormalities of gait and mobility: Secondary | ICD-10-CM | POA: Diagnosis not present

## 2019-02-24 DIAGNOSIS — R1319 Other dysphagia: Secondary | ICD-10-CM | POA: Diagnosis not present

## 2019-02-24 DIAGNOSIS — R293 Abnormal posture: Secondary | ICD-10-CM | POA: Diagnosis not present

## 2019-02-24 DIAGNOSIS — E43 Unspecified severe protein-calorie malnutrition: Secondary | ICD-10-CM | POA: Diagnosis not present

## 2019-02-25 DIAGNOSIS — R1319 Other dysphagia: Secondary | ICD-10-CM | POA: Diagnosis not present

## 2019-02-25 DIAGNOSIS — E43 Unspecified severe protein-calorie malnutrition: Secondary | ICD-10-CM | POA: Diagnosis not present

## 2019-02-25 DIAGNOSIS — J449 Chronic obstructive pulmonary disease, unspecified: Secondary | ICD-10-CM | POA: Diagnosis not present

## 2019-02-25 DIAGNOSIS — Z741 Need for assistance with personal care: Secondary | ICD-10-CM | POA: Diagnosis not present

## 2019-02-25 DIAGNOSIS — S82201D Unspecified fracture of shaft of right tibia, subsequent encounter for closed fracture with routine healing: Secondary | ICD-10-CM | POA: Diagnosis not present

## 2019-02-25 DIAGNOSIS — M6281 Muscle weakness (generalized): Secondary | ICD-10-CM | POA: Diagnosis not present

## 2019-02-25 DIAGNOSIS — R293 Abnormal posture: Secondary | ICD-10-CM | POA: Diagnosis not present

## 2019-02-25 DIAGNOSIS — R2689 Other abnormalities of gait and mobility: Secondary | ICD-10-CM | POA: Diagnosis not present

## 2019-02-26 ENCOUNTER — Telehealth: Payer: Self-pay

## 2019-02-26 DIAGNOSIS — R293 Abnormal posture: Secondary | ICD-10-CM | POA: Diagnosis not present

## 2019-02-26 DIAGNOSIS — R1319 Other dysphagia: Secondary | ICD-10-CM | POA: Diagnosis not present

## 2019-02-26 DIAGNOSIS — J449 Chronic obstructive pulmonary disease, unspecified: Secondary | ICD-10-CM | POA: Diagnosis not present

## 2019-02-26 DIAGNOSIS — Z741 Need for assistance with personal care: Secondary | ICD-10-CM | POA: Diagnosis not present

## 2019-02-26 DIAGNOSIS — M6281 Muscle weakness (generalized): Secondary | ICD-10-CM | POA: Diagnosis not present

## 2019-02-26 DIAGNOSIS — S82201D Unspecified fracture of shaft of right tibia, subsequent encounter for closed fracture with routine healing: Secondary | ICD-10-CM | POA: Diagnosis not present

## 2019-02-26 DIAGNOSIS — R2689 Other abnormalities of gait and mobility: Secondary | ICD-10-CM | POA: Diagnosis not present

## 2019-02-26 DIAGNOSIS — E43 Unspecified severe protein-calorie malnutrition: Secondary | ICD-10-CM | POA: Diagnosis not present

## 2019-02-26 NOTE — Telephone Encounter (Signed)
LMOM TO RESCHEDULE 

## 2019-02-27 DIAGNOSIS — Z741 Need for assistance with personal care: Secondary | ICD-10-CM | POA: Diagnosis not present

## 2019-02-27 DIAGNOSIS — R5381 Other malaise: Secondary | ICD-10-CM | POA: Diagnosis not present

## 2019-02-27 DIAGNOSIS — E43 Unspecified severe protein-calorie malnutrition: Secondary | ICD-10-CM | POA: Diagnosis not present

## 2019-02-27 DIAGNOSIS — M6281 Muscle weakness (generalized): Secondary | ICD-10-CM | POA: Diagnosis not present

## 2019-02-27 DIAGNOSIS — I10 Essential (primary) hypertension: Secondary | ICD-10-CM | POA: Diagnosis not present

## 2019-02-27 DIAGNOSIS — R1319 Other dysphagia: Secondary | ICD-10-CM | POA: Diagnosis not present

## 2019-02-27 DIAGNOSIS — S82201A Unspecified fracture of shaft of right tibia, initial encounter for closed fracture: Secondary | ICD-10-CM | POA: Diagnosis not present

## 2019-02-27 DIAGNOSIS — R293 Abnormal posture: Secondary | ICD-10-CM | POA: Diagnosis not present

## 2019-02-27 DIAGNOSIS — J449 Chronic obstructive pulmonary disease, unspecified: Secondary | ICD-10-CM | POA: Diagnosis not present

## 2019-02-27 DIAGNOSIS — R2689 Other abnormalities of gait and mobility: Secondary | ICD-10-CM | POA: Diagnosis not present

## 2019-02-27 DIAGNOSIS — S82201D Unspecified fracture of shaft of right tibia, subsequent encounter for closed fracture with routine healing: Secondary | ICD-10-CM | POA: Diagnosis not present

## 2019-02-27 DIAGNOSIS — I5181 Takotsubo syndrome: Secondary | ICD-10-CM | POA: Diagnosis not present

## 2019-02-28 DIAGNOSIS — J449 Chronic obstructive pulmonary disease, unspecified: Secondary | ICD-10-CM | POA: Diagnosis not present

## 2019-02-28 DIAGNOSIS — R2689 Other abnormalities of gait and mobility: Secondary | ICD-10-CM | POA: Diagnosis not present

## 2019-02-28 DIAGNOSIS — R1319 Other dysphagia: Secondary | ICD-10-CM | POA: Diagnosis not present

## 2019-02-28 DIAGNOSIS — R293 Abnormal posture: Secondary | ICD-10-CM | POA: Diagnosis not present

## 2019-02-28 DIAGNOSIS — M6281 Muscle weakness (generalized): Secondary | ICD-10-CM | POA: Diagnosis not present

## 2019-02-28 DIAGNOSIS — E43 Unspecified severe protein-calorie malnutrition: Secondary | ICD-10-CM | POA: Diagnosis not present

## 2019-02-28 DIAGNOSIS — Z741 Need for assistance with personal care: Secondary | ICD-10-CM | POA: Diagnosis not present

## 2019-02-28 DIAGNOSIS — S82201D Unspecified fracture of shaft of right tibia, subsequent encounter for closed fracture with routine healing: Secondary | ICD-10-CM | POA: Diagnosis not present

## 2019-03-03 DIAGNOSIS — M6281 Muscle weakness (generalized): Secondary | ICD-10-CM | POA: Diagnosis not present

## 2019-03-03 DIAGNOSIS — J449 Chronic obstructive pulmonary disease, unspecified: Secondary | ICD-10-CM | POA: Diagnosis not present

## 2019-03-03 DIAGNOSIS — R293 Abnormal posture: Secondary | ICD-10-CM | POA: Diagnosis not present

## 2019-03-03 DIAGNOSIS — R2689 Other abnormalities of gait and mobility: Secondary | ICD-10-CM | POA: Diagnosis not present

## 2019-03-03 DIAGNOSIS — E43 Unspecified severe protein-calorie malnutrition: Secondary | ICD-10-CM | POA: Diagnosis not present

## 2019-03-03 DIAGNOSIS — Z741 Need for assistance with personal care: Secondary | ICD-10-CM | POA: Diagnosis not present

## 2019-03-03 DIAGNOSIS — S82201D Unspecified fracture of shaft of right tibia, subsequent encounter for closed fracture with routine healing: Secondary | ICD-10-CM | POA: Diagnosis not present

## 2019-03-03 DIAGNOSIS — R1319 Other dysphagia: Secondary | ICD-10-CM | POA: Diagnosis not present

## 2019-03-04 ENCOUNTER — Ambulatory Visit: Payer: Medicare Other

## 2019-03-04 DIAGNOSIS — R2689 Other abnormalities of gait and mobility: Secondary | ICD-10-CM | POA: Diagnosis not present

## 2019-03-04 DIAGNOSIS — R293 Abnormal posture: Secondary | ICD-10-CM | POA: Diagnosis not present

## 2019-03-04 DIAGNOSIS — E43 Unspecified severe protein-calorie malnutrition: Secondary | ICD-10-CM | POA: Diagnosis not present

## 2019-03-04 DIAGNOSIS — S82201D Unspecified fracture of shaft of right tibia, subsequent encounter for closed fracture with routine healing: Secondary | ICD-10-CM | POA: Diagnosis not present

## 2019-03-04 DIAGNOSIS — R1319 Other dysphagia: Secondary | ICD-10-CM | POA: Diagnosis not present

## 2019-03-04 DIAGNOSIS — J449 Chronic obstructive pulmonary disease, unspecified: Secondary | ICD-10-CM | POA: Diagnosis not present

## 2019-03-04 DIAGNOSIS — Z741 Need for assistance with personal care: Secondary | ICD-10-CM | POA: Diagnosis not present

## 2019-03-04 DIAGNOSIS — M6281 Muscle weakness (generalized): Secondary | ICD-10-CM | POA: Diagnosis not present

## 2019-03-04 NOTE — Progress Notes (Deleted)
03/04/2019 Julie Durham 29-Aug-1940 [id number]   HPI:  Julie Durham is a 79 y.o. female patient of Dr Allyson Sabal, with a PMH below who presents today for hypertension clinic evaluation.  Past Medical History: hyperlipidemia   ASCVD   Takatsubo cardiomyopathy            Blood Pressure Goal:  130/80  Current Medications:  Family Hx:  Social Hx:  Diet:  Exercise:  Home BP readings:  Intolerances:   Labs:  Wt Readings from Last 3 Encounters:  01/31/19 87 lb (39.5 kg)  01/08/19 87 lb (39.5 kg)  12/23/18 69 lb 10.7 oz (31.6 kg)   BP Readings from Last 3 Encounters:  01/31/19 (!) 183/87  01/08/19 133/78  12/26/18 (!) 188/90   Pulse Readings from Last 3 Encounters:  01/31/19 77  01/08/19 (!) 101  12/26/18 93    Current Outpatient Medications  Medication Sig Dispense Refill  . acetaminophen (TYLENOL) 500 MG tablet Take 1 tablet (500 mg total) by mouth every 6 (six) hours as needed for mild pain or moderate pain.    Marland Kitchen amLODipine (NORVASC) 5 MG tablet Take 1 tablet (5 mg total) by mouth daily. 180 tablet 3  . aspirin EC 81 MG tablet Take 81 mg by mouth daily.     Marland Kitchen atorvastatin (LIPITOR) 10 MG tablet Take 1 tablet (10 mg total) by mouth daily at 6 PM. 30 tablet 6  . Calcium Carbonate-Vitamin D (CALCIUM 600+D) 600-400 MG-UNIT tablet Take 1 tablet by mouth 2 (two) times daily.    . feeding supplement, ENSURE ENLIVE, (ENSURE ENLIVE) LIQD Take 237 mLs by mouth 3 (three) times daily between meals.    . irbesartan (AVAPRO) 150 MG tablet Take 1 tablet (150 mg total) by mouth daily. 90 tablet 3  . metoprolol tartrate (LOPRESSOR) 25 MG tablet TAKE 1/2 TABLET(12.5 MG) BY MOUTH TWICE DAILY (Patient taking differently: Take 12.5 mg by mouth daily. ) 90 tablet 1  . Multiple Vitamins-Minerals (OCUVITE EXTRA) TABS Take 1 tablet by mouth 2 (two) times daily.    Marland Kitchen oxyCODONE (OXY IR/ROXICODONE) 5 MG immediate release tablet Take 1 tablet (5 mg total) by mouth every 12 (twelve)  hours as needed for severe pain. 6 tablet 0  . QUEtiapine (SEROQUEL) 25 MG tablet Take 0.5 tablets (12.5 mg total) by mouth at bedtime.    . rosuvastatin (CRESTOR) 5 MG tablet Take 5 mg by mouth daily.     No current facility-administered medications for this visit.    Allergies  Allergen Reactions  . Doxycycline Hyclate Anaphylaxis and Other (See Comments)    Chest pain  . Shellfish Allergy Anaphylaxis  . Demerol Other (See Comments)    Sedation  . Sulfa Antibiotics Other (See Comments)    Unknown rxn    Past Medical History:  Diagnosis Date  . Allergy   . Anemia   . Arthritis   . Asthma   . Brain mass    a. 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  . Cataract   . Elevated troponin   . Encephalitis, viral    a. 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 Hyperlipidemia   . Hypertension   . Mild CAD per cath 10/2018   . Mild mitral regurgitation    a. Echo 2011: LVEF >55% w/ grade 1 DD,  mild MR, mild TR, elevated RVSP 30-4mmHg  . Mild tricuspid regurgitation   . Palpitations   . Takotsubo cardiomyopathy     There were no vitals taken for this visit.  No problem-specific Assessment & Plan notes found for this encounter.   Tommy Medal PharmD CPP [facility] [address] [place], [place] [address] [phone]

## 2019-03-05 ENCOUNTER — Ambulatory Visit: Payer: Medicare Other

## 2019-03-05 DIAGNOSIS — J449 Chronic obstructive pulmonary disease, unspecified: Secondary | ICD-10-CM | POA: Diagnosis not present

## 2019-03-05 DIAGNOSIS — R1319 Other dysphagia: Secondary | ICD-10-CM | POA: Diagnosis not present

## 2019-03-05 DIAGNOSIS — S82201D Unspecified fracture of shaft of right tibia, subsequent encounter for closed fracture with routine healing: Secondary | ICD-10-CM | POA: Diagnosis not present

## 2019-03-05 DIAGNOSIS — R293 Abnormal posture: Secondary | ICD-10-CM | POA: Diagnosis not present

## 2019-03-05 DIAGNOSIS — Z741 Need for assistance with personal care: Secondary | ICD-10-CM | POA: Diagnosis not present

## 2019-03-05 DIAGNOSIS — E43 Unspecified severe protein-calorie malnutrition: Secondary | ICD-10-CM | POA: Diagnosis not present

## 2019-03-05 DIAGNOSIS — M6281 Muscle weakness (generalized): Secondary | ICD-10-CM | POA: Diagnosis not present

## 2019-03-05 DIAGNOSIS — R2689 Other abnormalities of gait and mobility: Secondary | ICD-10-CM | POA: Diagnosis not present

## 2019-03-06 DIAGNOSIS — M6281 Muscle weakness (generalized): Secondary | ICD-10-CM | POA: Diagnosis not present

## 2019-03-06 DIAGNOSIS — S82201D Unspecified fracture of shaft of right tibia, subsequent encounter for closed fracture with routine healing: Secondary | ICD-10-CM | POA: Diagnosis not present

## 2019-03-06 DIAGNOSIS — Z741 Need for assistance with personal care: Secondary | ICD-10-CM | POA: Diagnosis not present

## 2019-03-06 DIAGNOSIS — J449 Chronic obstructive pulmonary disease, unspecified: Secondary | ICD-10-CM | POA: Diagnosis not present

## 2019-03-06 DIAGNOSIS — R1319 Other dysphagia: Secondary | ICD-10-CM | POA: Diagnosis not present

## 2019-03-06 DIAGNOSIS — R293 Abnormal posture: Secondary | ICD-10-CM | POA: Diagnosis not present

## 2019-03-06 DIAGNOSIS — R2689 Other abnormalities of gait and mobility: Secondary | ICD-10-CM | POA: Diagnosis not present

## 2019-03-06 DIAGNOSIS — E43 Unspecified severe protein-calorie malnutrition: Secondary | ICD-10-CM | POA: Diagnosis not present

## 2019-03-07 ENCOUNTER — Ambulatory Visit: Payer: Medicare Other

## 2019-03-07 DIAGNOSIS — S82201D Unspecified fracture of shaft of right tibia, subsequent encounter for closed fracture with routine healing: Secondary | ICD-10-CM | POA: Diagnosis not present

## 2019-03-07 DIAGNOSIS — Z741 Need for assistance with personal care: Secondary | ICD-10-CM | POA: Diagnosis not present

## 2019-03-07 DIAGNOSIS — R293 Abnormal posture: Secondary | ICD-10-CM | POA: Diagnosis not present

## 2019-03-07 DIAGNOSIS — R1319 Other dysphagia: Secondary | ICD-10-CM | POA: Diagnosis not present

## 2019-03-07 DIAGNOSIS — J449 Chronic obstructive pulmonary disease, unspecified: Secondary | ICD-10-CM | POA: Diagnosis not present

## 2019-03-07 DIAGNOSIS — E43 Unspecified severe protein-calorie malnutrition: Secondary | ICD-10-CM | POA: Diagnosis not present

## 2019-03-07 DIAGNOSIS — M6281 Muscle weakness (generalized): Secondary | ICD-10-CM | POA: Diagnosis not present

## 2019-03-07 DIAGNOSIS — R2689 Other abnormalities of gait and mobility: Secondary | ICD-10-CM | POA: Diagnosis not present

## 2019-03-18 ENCOUNTER — Other Ambulatory Visit: Payer: Self-pay

## 2019-03-18 NOTE — Patient Outreach (Signed)
[facility]) Care Management  03/18/2019  Julie Durham [birth year] [id number]   Medication Adherence call to Julie Durham Telephone call to Patient regarding Medication Adherence unable to reach patient Julie Durham is showing past due on Irbesartan 150 mg under [facility] Ins.   Lillia Abed CPhT Pharmacy Technician [facility] Care Management Direct Dial [phone]  Fax [phone] [email or web address]

## 2019-05-01 ENCOUNTER — Telehealth (HOSPITAL_COMMUNITY): Payer: Self-pay | Admitting: Cardiovascular Disease

## 2019-05-01 ENCOUNTER — Other Ambulatory Visit (HOSPITAL_COMMUNITY): Payer: Medicare (Managed Care)

## 2019-05-01 NOTE — Telephone Encounter (Signed)
Patient No Showed her echocardiogram for 05/01/19 and I called and spoke with patient and she states that she is in Rehab after a hospitalization and was not able to come anyway and not able to reschedule. She states that she has been there awhile and not sure when she will get out. I have removed order from the WQ and if we need to do at a later date we can enter new order or reinstate the old one.

## 2019-05-06 ENCOUNTER — Telehealth: Payer: Self-pay | Admitting: Cardiovascular Disease

## 2019-05-06 NOTE — Telephone Encounter (Signed)
Routed to [facility] LPN, covering for Dr. Allyson Sabal as Lorain Childes This is the second encounter concerning same message See note 4/22

## 2019-05-06 NOTE — Telephone Encounter (Signed)
Silia from [place] called and was unaware that patient had an appt on 05/01/19 for an echo. She wants to know if this needs to be rescheduled. She is normally the one who brings her to the appointments.

## 2019-05-06 NOTE — Telephone Encounter (Signed)
Returned call to [facility] no answer.LMTC.

## 2019-05-07 NOTE — Telephone Encounter (Signed)
Spoke to [place] at [facility].Advised patient does need a echo.Advised scheduler will call back with appointment.

## 2019-05-07 NOTE — Telephone Encounter (Signed)
Follow Up  i have silia returning a call from yesterday afternoon  Please call back

## 2019-05-28 ENCOUNTER — Ambulatory Visit (HOSPITAL_COMMUNITY): Payer: Medicare (Managed Care) | Attending: Cardiovascular Disease

## 2019-05-28 ENCOUNTER — Other Ambulatory Visit: Payer: Self-pay

## 2019-05-28 DIAGNOSIS — E785 Hyperlipidemia, unspecified: Secondary | ICD-10-CM | POA: Insufficient documentation

## 2019-05-28 DIAGNOSIS — I2510 Atherosclerotic heart disease of native coronary artery without angina pectoris: Secondary | ICD-10-CM | POA: Insufficient documentation

## 2019-05-28 DIAGNOSIS — I351 Nonrheumatic aortic (valve) insufficiency: Secondary | ICD-10-CM | POA: Diagnosis not present

## 2019-05-28 DIAGNOSIS — I519 Heart disease, unspecified: Secondary | ICD-10-CM | POA: Insufficient documentation

## 2019-08-07 ENCOUNTER — Encounter: Payer: Self-pay | Admitting: Cardiology

## 2019-08-07 ENCOUNTER — Other Ambulatory Visit: Payer: Self-pay

## 2019-08-07 ENCOUNTER — Ambulatory Visit (INDEPENDENT_AMBULATORY_CARE_PROVIDER_SITE_OTHER): Payer: Medicare (Managed Care) | Admitting: Cardiology

## 2019-08-07 VITALS — BP 125/78 | HR 70 | Temp 100.3°F | Ht 59.0 in | Wt 72.8 lb

## 2019-08-07 DIAGNOSIS — I10 Essential (primary) hypertension: Secondary | ICD-10-CM | POA: Diagnosis not present

## 2019-08-07 DIAGNOSIS — E782 Mixed hyperlipidemia: Secondary | ICD-10-CM | POA: Diagnosis not present

## 2019-08-07 DIAGNOSIS — I5181 Takotsubo syndrome: Secondary | ICD-10-CM | POA: Diagnosis not present

## 2019-08-07 DIAGNOSIS — I2510 Atherosclerotic heart disease of native coronary artery without angina pectoris: Secondary | ICD-10-CM

## 2019-08-07 DIAGNOSIS — R509 Fever, unspecified: Secondary | ICD-10-CM

## 2019-08-07 NOTE — Patient Instructions (Signed)
Medication Instructions:  Your physician recommends that you continue on your current medications as directed. Please refer to the Current Medication list given to you today.  *If you need a refill on your cardiac medications before your next appointment, please call your pharmacy*   Follow-Up: At [facility], you and your health needs are our priority.  As part of our continuing mission to provide you with exceptional heart care, we have created designated Provider Care Teams.  These Care Teams include your primary Cardiologist (physician) and Advanced Practice Providers (APPs -  Physician Assistants and Nurse Practitioners) who all work together to provide you with the care you need, when you need it.  We recommend signing up for the patient portal called "MyChart".  Sign up information is provided on this After Visit Summary.  MyChart is used to connect with patients for Virtual Visits (Telemedicine).  Patients are able to view lab/test results, encounter notes, upcoming appointments, etc.  Non-urgent messages can be sent to your provider as well.   To learn more about what you can do with MyChart, go to [email or web address].    Your next appointment:   6 month(s)  The format for your next appointment:   In Person  Provider:   You may see Nanetta Batty, MD or one of the following Advanced Practice Providers on your designated Care Team:    Corine Shelter, PA-C  [place], [place]  Edd Fabian, [place]    Other Instructions Please call our office in November or December for a follow-up appointment with Dr. Allyson Sabal in February.

## 2019-08-07 NOTE — Assessment & Plan Note (Signed)
S/P kyphoplasty Oct 2020- EF then 45-50% with apical HK. She had no significant CAD at cath. Echo in May 2021 showed normal LVF-60-65%, no WMA

## 2019-08-07 NOTE — Progress Notes (Signed)
Cardiology Office Note:    Date:  08/07/2019   ID:  Julie Durham, DOB 1940-11-24, MRN [id number]  PCP:  Patient, No Pcp Per  Cardiologist:  Nanetta Batty, MD  Electrophysiologist:  None   Referring MD: No ref. provider found   No chief complaint on file.   History of Present Illness:    Julie Durham is a 79 y.o. female with a hx of hypertension and a family history of coronary disease.  Cardiology saw her in October 2020 after she had chest pain post kyphoplasty.  She did have elevated troponins then and a cardiac catheterization was done.  This revealed no significant coronary disease.  She did have a echo that showed LV dysfunction with an EF of 45%, it was felt she had a Takotsubo event. Follow up echo may 2021 showed normal LVF with an EF of 60-65% with no WMA.    The patient is a resident of [facility] skilled nursing facility.  She is seen today for routine 84-month follow-up.  Since we saw her last she says a couple weeks ago she had a upper respiratory illness at the nursing home.  She says she required oxygen at night.  Her roommate also had an URI then.  She has been vaccinated against COVID.  She tells me that she is over this now.  She did have a low-grade temp when she came to the office today 100.3.  She says when she overexerts herself with physical therapy she has chest discomfort, this is been a chronic complaint.   Past Medical History:  Diagnosis Date  . Acute cystitis without hematuria   . Acute encephalopathy 01/03/2019  . Acute lower UTI 01/04/2019  . Acute pain of right knee   . Allergy   . Anemia   . Arthritis   . Asthma   . Brain mass    a. 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  . Cataract   . Elevated troponin   . Encephalitis, viral    a. 08/2018: diagnosed with VZV  encephalitis at [facility]  . Hyperlipidemia   . Hypertension   . Lumbar compression fracture ([name]) 10/22/2018  . Mild CAD per cath 10/2018   . Mild mitral regurgitation    a. Echo 2011: LVEF >55% w/ grade 1 DD, mild MR, mild TR, elevated RVSP 30-8mmHg  . Mild tricuspid regurgitation   . Oropharyngeal dysphagia 10/04/2016  . Other bursal cyst, left elbow 10/04/2016  . Palpitations   . Pill esophagitis 10/04/2016  . Stress fracture of lumbar vertebra 10/24/2018  . Systolic dysfunction 10/24/2018   Nonischemic cardiomyopathy  . Takotsubo cardiomyopathy   . Takotsubo syndrome     Past Surgical History:  Procedure Laterality Date  . APPENDECTOMY    . CHOLECYSTECTOMY    . DOPPLER ECHOCARDIOGRAPHY  2011  . EYE SURGERY    . KYPHOPLASTY Bilateral 10/22/2018   Procedure: KYPHOPLASTY LUMBAR ONE, LUMBAR FOUR;  Surgeon: Lisbeth Renshaw, MD;  Location: [facility] OR;  Service: Neurosurgery;  Laterality: Bilateral;  KYPHOPLASTY LUMBAR ONE, LUMBAR FOUR  . LEFT HEART CATH AND CORONARY ANGIOGRAPHY N/A 10/23/2018   Procedure: LEFT HEART CATH AND CORONARY ANGIOGRAPHY;  Surgeon: Tonny Bollman, MD;  Location: [facility] INVASIVE CV LAB;  Service: Cardiovascular;  Laterality: N/A;  . TUBAL LIGATION      Current Medications: Current Meds  Medication Sig  . acetaminophen (TYLENOL) 500 MG tablet Take 1 tablet (500 mg total) by mouth every 6 (six) hours as needed for mild pain or moderate pain.  Marland Kitchen amLODipine (NORVASC) 5 MG tablet Take 5 mg by mouth daily.  Marland Kitchen aspirin EC 81 MG tablet Take 81 mg by mouth daily.   . Calcium Carbonate-Vitamin D (CALCIUM 600+D) 600-400 MG-UNIT tablet Take 1 tablet by mouth 2 (two) times daily.  . cetirizine (ZYRTEC) 10 MG tablet Take 10 mg by mouth daily.  . feeding supplement, ENSURE ENLIVE, (ENSURE ENLIVE) LIQD Take 237 mLs by mouth 3 (three) times daily between meals.  . fluticasone (FLONASE) 50 MCG/ACT nasal spray Place 1 spray into both nostrils daily.  . irbesartan (AVAPRO) 150  MG tablet Take 1 tablet (150 mg total) by mouth daily.  . metoprolol tartrate (LOPRESSOR) 25 MG tablet Take 12.5 mg by mouth 2 (two) times daily.  . Multiple Vitamins-Minerals (OCUVITE EXTRA) TABS Take 1 tablet by mouth 2 (two) times daily.  . rosuvastatin (CRESTOR) 5 MG tablet Take 5 mg by mouth daily.     Allergies:   Doxycycline hyclate, Shellfish allergy, Demerol, and Sulfa antibiotics   Social History   Socioeconomic History  . Marital status: Legally Separated    Spouse name: Not on file  . Number of children: Not on file  . Years of education: Not on file  . Highest education level: Not on file  Occupational History  . Not on file  Tobacco Use  . Smoking status: Never Smoker  . Smokeless tobacco: Never Used  Vaping Use  . Vaping Use: Never used  Substance and Sexual Activity  . Alcohol use: Never  . Drug use: Never  . Sexual activity: Not on file  Other Topics Concern  . Not on file  Social History Narrative  . Not on file   Social Determinants of Health   Financial Resource Strain:   . Difficulty of Paying Living Expenses:   Food Insecurity:   . Worried About Programme researcher, broadcasting/film/video in the Last Year:   . Barista in the Last Year:   Transportation Needs:   . Freight forwarder (Medical):   Marland Kitchen Lack of Transportation (Non-Medical):   Physical Activity:   . Days of Exercise per Week:   . Minutes of Exercise per Session:   Stress:   . Feeling of Stress :   Social Connections:   . Frequency of Communication with Friends and Family:   . Frequency of Social Gatherings with Friends and Family:   . Attends Religious Services:   . Active Member of Clubs or Organizations:   . Attends Banker Meetings:   Marland Kitchen Marital Status:      Family History: The patient's family history includes Alzheimer's disease in her mother; Diabetes in her son; Heart attack in her father; Heart disease in her brother, brother, and brother; Sensorineural hearing loss in  her sister; Thyroid disease in her brother.  ROS:   Please see the history of present illness.     All other systems reviewed and are negative.  EKGs/Labs/Other Studies Reviewed:    The following studies were reviewed today: Echo may 2021- IMPRESSIONS    1. Left ventricular ejection fraction, by estimation, is 60 to 65%. The  left ventricle has normal function. The left ventricle has no regional  wall motion abnormalities. There is mild concentric left ventricular  hypertrophy. Left ventricular diastolic  parameters are consistent with Grade  I diastolic dysfunction (impaired  relaxation). Elevated left ventricular end-diastolic pressure.  2. Right ventricular systolic function is normal. The right ventricular  size is normal. There is normal pulmonary artery systolic pressure.  3. The mitral valve is normal in structure. Trivial mitral valve  regurgitation. No evidence of mitral stenosis.  4. The aortic valve is tricuspid. Aortic valve regurgitation is mild. No  aortic stenosis is present.  5. The inferior vena cava is normal in size with greater than 50%  respiratory variability, suggesting right atrial pressure of 3 mmHg.   EKG:  EKG is ordered today.  The ekg ordered today demonstrates NSR- HR 70, poor anterior RW  Recent Labs: 12/26/2018: B Natriuretic Peptide 193.2 01/04/2019: TSH 2.121 01/07/2019: ALT 15; Magnesium 2.0 01/08/2019: BUN 11; Creatinine, Ser 0.59; Hemoglobin 11.3; Platelets 359; Potassium 4.1; Sodium 136  Recent Lipid Panel    Component Value Date/Time   CHOL 154 10/24/2018 0836   CHOL 126 03/01/2017 0921   TRIG 176 (H) 10/24/2018 0836   HDL 54 10/24/2018 0836   HDL 58 03/01/2017 0921   CHOLHDL 2.9 10/24/2018 0836   VLDL 35 10/24/2018 0836   LDLCALC 65 10/24/2018 0836   LDLCALC 47 03/01/2017 0921    Physical Exam:    VS:  BP 125/78   Pulse 70   Temp 100.3 F (37.9 C)   Ht 4\' 11"  (1.499 m)   Wt (!) 72 lb 12.8 oz (33 kg)   SpO2 97%   BMI  14.70 kg/m     Wt Readings from Last 3 Encounters:  08/07/19 (!) 72 lb 12.8 oz (33 kg)  01/31/19 87 lb (39.5 kg)  01/08/19 87 lb (39.5 kg)     GEN:  Thin, energetic, caucasian female in wheel chair, well developed in no acute distress HEENT: Normal NECK: No JVD; No carotid bruits CARDIAC: RRR, no murmurs, rubs, gallops RESPIRATORY:  Clear to auscultation without rales, wheezing or rhonchi  ABDOMEN:  non-distended MUSCULOSKELETAL:  Kyphosis, No edema; No deformity  SKIN: Warm and dry NEUROLOGIC:  Alert and oriented x 3 PSYCHIATRIC:  Normal affect   ASSESSMENT:    Takotsubo syndrome S/P kyphoplasty Oct 2020- EF then 45-50% with apical HK. She had no significant CAD at cath. Echo in May 2021 showed normal LVF-60-65%, no WMA  Mild CAD Non obstructive CAD at cath Oct 2020  Essential hypertension Controlled  Febrile She has a low grade temperature today- 100.3.  She denies any dysuria or SOB.  She did tell me she had a URI "a couple weeks ago".  She has been vaccinated against COVID.  Will defer to her SNF further work up.   PLAN:    No change in her medical Rx. F/U Dr Allyson SabalBerry in 6 months.   Medication Adjustments/Labs and Tests Ordered: Current medicines are reviewed at length with the patient today.  Concerns regarding medicines are outlined above.  Orders Placed This Encounter  Procedures  . EKG 12-Lead   No orders of the defined types were placed in this encounter.   Patient Instructions  Medication Instructions:  Your physician recommends that you continue on your current medications as directed. Please refer to the Current Medication list given to you today.  *If you need a refill on your cardiac medications before your next appointment, please call your pharmacy*   Follow-Up: At [facility], you and your health needs are our priority.  As part of our continuing mission to provide you with exceptional heart care, we have created  designated Provider Care  Teams.  These Care Teams include your primary Cardiologist (physician) and Advanced Practice Providers (APPs -  Physician Assistants and Nurse Practitioners) who all work together to provide you with the care you need, when you need it.  We recommend signing up for the patient portal called "MyChart".  Sign up information is provided on this After Visit Summary.  MyChart is used to connect with patients for Virtual Visits (Telemedicine).  Patients are able to view lab/test results, encounter notes, upcoming appointments, etc.  Non-urgent messages can be sent to your provider as well.   To learn more about what you can do with MyChart, go to [email or web address].    Your next appointment:   6 month(s)  The format for your next appointment:   In Person  Provider:   You may see Nanetta Batty, MD or one of the following Advanced Practice Providers on your designated Care Team:    Corine Shelter, PA-C  [place], [place]  Edd Fabian, [place]    Other Instructions Please call our office in November or December for a follow-up appointment with Dr. Allyson Sabal in February.     Signed, Corine Shelter, PA-C  08/07/2019 10:30 AM    [facility]

## 2019-08-07 NOTE — Assessment & Plan Note (Signed)
Controlled.  

## 2019-08-07 NOTE — Assessment & Plan Note (Signed)
Non obstructive CAD at cath Oct 2020 

## 2019-08-07 NOTE — Assessment & Plan Note (Signed)
She has a low grade temperature today- 100.3.  She denies any dysuria or SOB.  She did tell me she had a URI "a couple weeks ago".  She has been vaccinated against COVID.  Will defer to her SNF further work up.

## 2021-02-26 ENCOUNTER — Other Ambulatory Visit: Payer: Self-pay

## 2021-02-26 ENCOUNTER — Encounter (HOSPITAL_COMMUNITY): Payer: Self-pay

## 2021-02-26 ENCOUNTER — Emergency Department (HOSPITAL_COMMUNITY)
Admission: EM | Admit: 2021-02-26 | Discharge: 2021-02-26 | Disposition: A | Discharge status: Home / Self Care | Payer: Medicare (Managed Care) | Attending: Emergency Medicine | Admitting: Emergency Medicine

## 2021-02-26 ENCOUNTER — Emergency Department (HOSPITAL_COMMUNITY): Payer: Medicare (Managed Care)

## 2021-02-26 DIAGNOSIS — Z7951 Long term (current) use of inhaled steroids: Secondary | ICD-10-CM | POA: Diagnosis not present

## 2021-02-26 DIAGNOSIS — Z79899 Other long term (current) drug therapy: Secondary | ICD-10-CM | POA: Insufficient documentation

## 2021-02-26 DIAGNOSIS — I10 Essential (primary) hypertension: Secondary | ICD-10-CM | POA: Diagnosis not present

## 2021-02-26 DIAGNOSIS — J45909 Unspecified asthma, uncomplicated: Secondary | ICD-10-CM | POA: Insufficient documentation

## 2021-02-26 DIAGNOSIS — Z7982 Long term (current) use of aspirin: Secondary | ICD-10-CM | POA: Diagnosis not present

## 2021-02-26 DIAGNOSIS — R079 Chest pain, unspecified: Secondary | ICD-10-CM

## 2021-02-26 DIAGNOSIS — R0789 Other chest pain: Secondary | ICD-10-CM | POA: Insufficient documentation

## 2021-02-26 LAB — CBC
HCT: 42.3 % (ref 36.0–46.0)
Hemoglobin: 13.7 g/dL (ref 12.0–15.0)
MCH: 30 pg (ref 26.0–34.0)
MCHC: 32.4 g/dL (ref 30.0–36.0)
MCV: 92.6 fL (ref 80.0–100.0)
Platelets: 335 10*3/uL (ref 150–400)
RBC: 4.57 MIL/uL (ref 3.87–5.11)
RDW: 12.6 % (ref 11.5–15.5)
WBC: 5.1 10*3/uL (ref 4.0–10.5)
nRBC: 0 % (ref 0.0–0.2)

## 2021-02-26 LAB — BASIC METABOLIC PANEL
Anion gap: 9 (ref 5–15)
BUN: 9 mg/dL (ref 8–23)
CO2: 26 mmol/L (ref 22–32)
Calcium: 8.6 mg/dL — ABNORMAL LOW (ref 8.9–10.3)
Chloride: 100 mmol/L (ref 98–111)
Creatinine, Ser: 0.7 mg/dL (ref 0.44–1.00)
GFR, Estimated: >60 mL/min (ref >60)
Glucose, Bld: 118 mg/dL — ABNORMAL HIGH (ref 70–99)
Potassium: 3.5 mmol/L (ref 3.5–5.1)
Sodium: 135 mmol/L (ref 135–145)

## 2021-02-26 LAB — TROPONIN I (HIGH SENSITIVITY)
Troponin I (High Sensitivity): 5 ng/L (ref <18)
Troponin I (High Sensitivity): 6 ng/L (ref <18)

## 2021-02-26 MED ORDER — SODIUM CHLORIDE 0.9% FLUSH: 3.0000 mL | Freq: Once | INTRAVENOUS | Status: DC

## 2021-02-26 MED ORDER — ACETAMINOPHEN 500 MG PO TABS
1000.0000 mg | ORAL_TABLET | Freq: Four times a day (QID) | ORAL | Status: DC | PRN
Start: 2021-02-26 — End: 2021-02-26
  Administered 2021-02-26: 1000 mg via ORAL
  Filled 2021-02-26: qty 2

## 2021-02-26 NOTE — ED Triage Notes (Signed)
C/o sharp left sided CP that increases with arm movement. No relief from one NTG given at nursing home

## 2021-02-26 NOTE — ED Notes (Signed)
RN attempted to call patients son with no answer. Called patients daughter and was informed patient has "early signs of dementia and a temper" Patient getting agitated and yelling at staff that she wants to leave. RN and PA explained waiting on test results prior to dispo.

## 2021-02-26 NOTE — ED Notes (Signed)
Patient transported to X-ray 

## 2021-02-26 NOTE — ED Provider Notes (Signed)
[facility] EMERGENCY DEPARTMENT Provider Note   CSN: [id number] Arrival date & time: 02/26/21  1014     History  Chief Complaint  Patient presents with   Chest Pain    Julie Durham is a 81 y.o. female with history of Takotsubo cardiomyopathy, palpitations, hypertension, hyperlipidemia, asthma, anemia.  Patient presents to ED for 1 week of chest discomfort.  Patient states that she has had a "lump" underneath her right armpit since high school and the patient states that over the last week she has also developed a "lump" in her left armpit.  Patient states that 2 lumps have now met in the middle and are causing her chest discomfort.  She describes the chest discomfort as a "ache" that is constant.  The patient denies any alleviating or aggravating factors.  She denies any radiation of the pain, worsening of pain with exertion.  The patient endorses chest pain.  The patient denies nausea, vomiting, shortness of breath, abdominal pain, diarrhea, fevers, lightheadedness, dizziness, weakness.   Chest Pain Associated symptoms: no abdominal pain, no dizziness, no fever, no nausea, no shortness of breath, no vomiting and no weakness       Home Medications Prior to Admission medications   Medication Sig Start Date End Date Taking? Authorizing Provider  acetaminophen (TYLENOL) 500 MG tablet Take 1 tablet (500 mg total) by mouth every 6 (six) hours as needed for mild pain or moderate pain. 01/08/19   Hongalgi, Lenis Dickinson, MD  amLODipine (NORVASC) 5 MG tablet Take 5 mg by mouth daily.    [provider]  aspirin EC 81 MG tablet Take 81 mg by mouth daily.     [provider]  Calcium Carbonate-Vitamin D (CALCIUM 600+D) 600-400 MG-UNIT tablet Take 1 tablet by mouth 2 (two) times daily.    [provider]  cetirizine (ZYRTEC) 10 MG tablet Take 10 mg by mouth daily.    [provider]  feeding supplement, ENSURE ENLIVE, (ENSURE ENLIVE) LIQD Take  237 mLs by mouth 3 (three) times daily between meals. 01/08/19   Hongalgi, Lenis Dickinson, MD  fluticasone (FLONASE) 50 MCG/ACT nasal spray Place 1 spray into both nostrils daily.    [provider]  irbesartan (AVAPRO) 150 MG tablet Take 1 tablet (150 mg total) by mouth daily. 10/21/18   Sande Rives E, PA-C  metoprolol tartrate (LOPRESSOR) 25 MG tablet Take 12.5 mg by mouth 2 (two) times daily.    [provider]  Multiple Vitamins-Minerals (OCUVITE EXTRA) TABS Take 1 tablet by mouth 2 (two) times daily.    [provider]  rosuvastatin (CRESTOR) 5 MG tablet Take 5 mg by mouth daily. 01/27/19   [provider]      Allergies    Doxycycline hyclate, Shellfish allergy, Demerol, and Sulfa antibiotics    Review of Systems   Review of Systems  Constitutional:  Negative for chills and fever.  Respiratory:  Negative for shortness of breath.   Cardiovascular:  Positive for chest pain.  Gastrointestinal:  Negative for abdominal pain, diarrhea, nausea and vomiting.  Neurological:  Negative for dizziness, weakness and light-headedness.  All other systems reviewed and are negative.  Physical Exam Updated Vital Signs BP (!) 165/82    Pulse 64    Temp 98.1 F (36.7 C)    Resp 17    Ht '4\' 11"'  (1.499 m)    Wt 29.5 kg    SpO2 97%    BMI 13.13 kg/m  Physical Exam  Vitals and nursing note reviewed.  Constitutional:      General: She is not in acute distress.    Appearance: She is well-developed. She is not ill-appearing, toxic-appearing or diaphoretic.  HENT:     Head: Normocephalic and atraumatic.  Eyes:     Extraocular Movements: Extraocular movements intact.     Pupils: Pupils are equal, round, and reactive to light.  Neck:     Vascular: No JVD.  Cardiovascular:     Rate and Rhythm: Normal rate and regular rhythm.  Pulmonary:     Effort: Pulmonary effort is normal.     Breath sounds: Normal breath sounds. No wheezing or rales.  Abdominal:     General:  Abdomen is flat.     Palpations: Abdomen is soft.     Tenderness: There is no abdominal tenderness.  Musculoskeletal:     Cervical back: Normal range of motion and neck supple.     Right lower leg: No edema.     Left lower leg: No edema.  Skin:    General: Skin is warm and dry.     Capillary Refill: Capillary refill takes less than 2 seconds.  Neurological:     General: No focal deficit present.     Mental Status: She is alert.    ED Results / Procedures / Treatments   Labs (all labs ordered are listed, but only abnormal results are displayed) Labs Reviewed  BASIC METABOLIC PANEL - Abnormal; Notable for the following components:      Result Value   Glucose, Bld 118 (*)    Calcium 8.6 (*)    All other components within normal limits  CBC  TROPONIN I (HIGH SENSITIVITY)  TROPONIN I (HIGH SENSITIVITY)    EKG EKG Interpretation  Date/Time:  Saturday February 26 2021 10:22:36 EST Ventricular Rate:  67 PR Interval:  176 QRS Duration: 99 QT Interval:  385 QTC Calculation: 407 R Axis:   52 Text Interpretation: Sinus rhythm Anterior infarct, old Borderline T abnormalities, inferior leads Confirmed by Dene Gentry [phone]) on 02/26/2021 10:34:30 AM  Radiology DG Chest 2 View  Result Date: 02/26/2021 CLINICAL DATA:  Chest pain and lower back pain EXAM: CHEST - 2 VIEW COMPARISON:  01/03/2019 FINDINGS: Heart size appears normal. Aortic atherosclerosis. The lungs are clear. No airspace opacity or signs of atelectasis. The bones appear diffusely osteopenic. There is thoracic kyphosis deformity secondary to numerous chronic compression fractures throughout the thoracic spine. Remote healed sternal manubrial fracture. IMPRESSION: No acute cardiopulmonary abnormalities. Electronically Signed   By: Kerby Moors M.D.   On: 02/26/2021 11:03    Procedures Procedures    Medications Ordered in ED Medications  sodium chloride flush (NS) 0.9 % injection 3 mL (3 mLs Intravenous Not Given  02/26/21 1057)  acetaminophen (TYLENOL) tablet 1,000 mg (1,000 mg Oral Given 02/26/21 1302)    ED Course/ Medical Decision Making/ A&P                           Medical Decision Making Amount and/or Complexity of Data Reviewed Labs: ordered. Radiology: ordered.  Risk OTC drugs.   81 year old female with history of Takotsubo cardiomyopathy presents to ED for evaluation of chest pain for the last 1 week.  On examination, the patient tells meandering story about how she has had this chest pain since high school and it started with a lump underneath her right armpit which is now caused a lump to grow underneath her  left armpit and the 2 lumps are causing pain which is meeting in the middle of her chest.  The patient is not tachycardic, she is not hypoxic, she has clear lung sounds bilaterally, she is a soft compressible abdomen.  She is nontoxic in appearance.  Negative Levine sign.  Patient worked up utilizing following labs: Troponin of 6.  Delta troponin was 1. CBC unremarkable BMP unremarkable Chest x-ray unremarkable.  No mediastinal widening, no effusions, no consolidations EKG shows sinus rhythm  When I went back into the patient's room to reevaluate her, she had grown frustrated and upset that she was still in the hospital.  She was telling the nurse that she felt like "this places a prison" and that she wished to be discharged.  She was frustrated with the amount of time that it had taken for her to be evaluated and worked up.  At the times this occurred the patient had been here for a total of 3 hours and 15 minutes.  The patient is requesting to be discharged at this time, however she will need to be transported back to her skilled nurse facility with [facility]. The patient's family members were contacted at this time.  The patient's daughter was not in town, she was in [place] at at professional sporting event so she was unable to report to ED.  The patient's son was not answering his  phone.  At this time, I feel that this patient is stable for discharge.  She does not have elevated troponin, her EKG is reassuring as well as her chest x-ray.  Her vital signs been stable throughout the duration of her visit here in the ED.  The patient is no longer complaining of chest pain.  The patient was provided headache medication in the form of 1000 g Tylenol for headache.  Patient provided return precautions, voiced understanding.  Patient had all her questions answered to her satisfaction.  Patient stable for discharge.   Final Clinical Impression(s) / ED Diagnoses Final diagnoses:  Chest pain, unspecified type    Rx / DC Orders ED Discharge Orders     None         Lawana Chambers 02/26/21 1406    Valarie Merino, MD 02/26/21 [phone]

## 2021-02-26 NOTE — Discharge Instructions (Signed)
Please return to ED with any new or worsening signs or symptoms such as shortness of breath, nausea, vomiting, lightheadedness, dizziness, weakness Please follow-up with your cardiologist in the next 2 to 3 days for ongoing management

## 2021-03-18 IMAGING — CT CT ANGIO CHEST
2 of 7 series · 19 of 46 positions shown · IV contrast (omnipaque)
Comparison: None.

CLINICAL DATA: Chest pain.

EXAM:
CT ANGIOGRAPHY CHEST WITH CONTRAST
TECHNIQUE: Multidetector CT imaging of the chest was performed using the
standard protocol during bolus administration of intravenous
contrast. Multiplanar CT image reconstructions and MIPs were
obtained to evaluate the vascular anatomy.
CONTRAST:  35mL OMNIPAQUE IOHEXOL 350 MG/ML SOLN

[Series 9: thins · axial · 0.51mm/px · z∈[-163,+72]mm · 16 of 378 slices shown]
[im 21/378  lung]
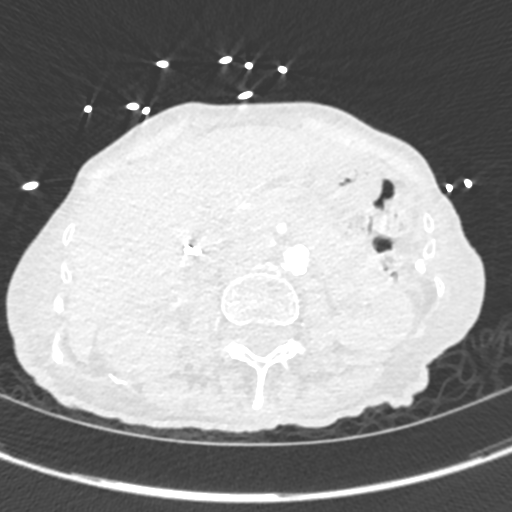
[im 42/378  soft-tissue]
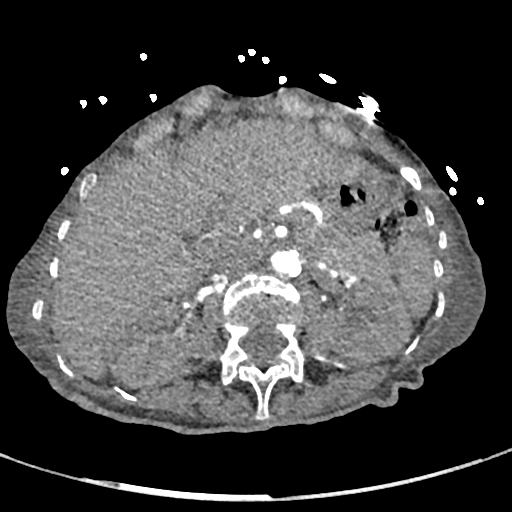
[im 63/378  lung]
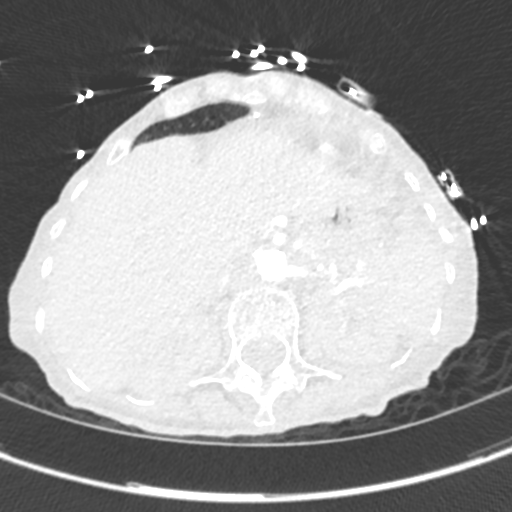
[im 84/378  soft-tissue]
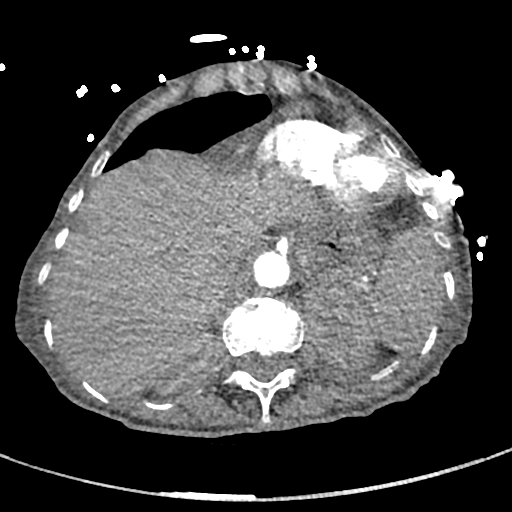
[im 105/378  lung]
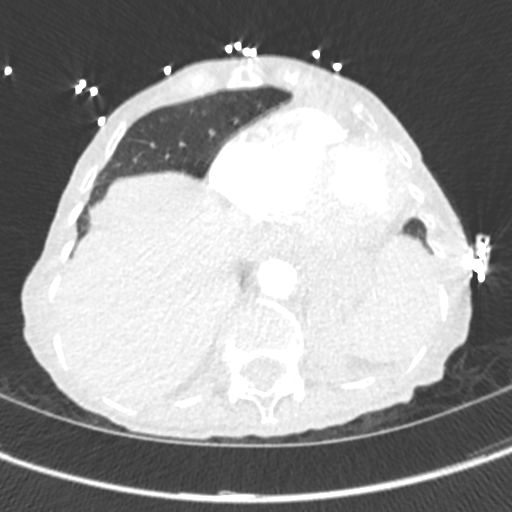
[im 126/378  soft-tissue]
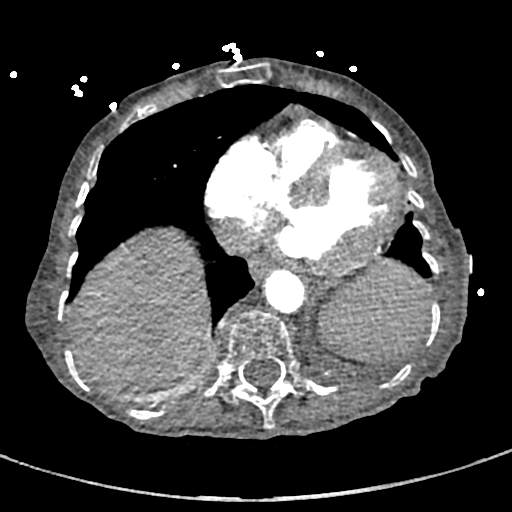
[im 147/378  lung]
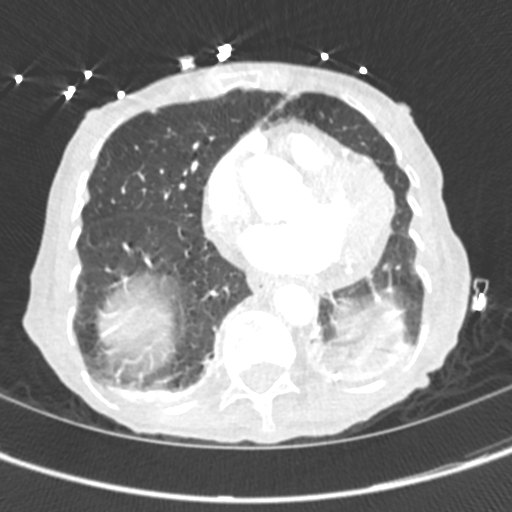
[im 168/378  soft-tissue]
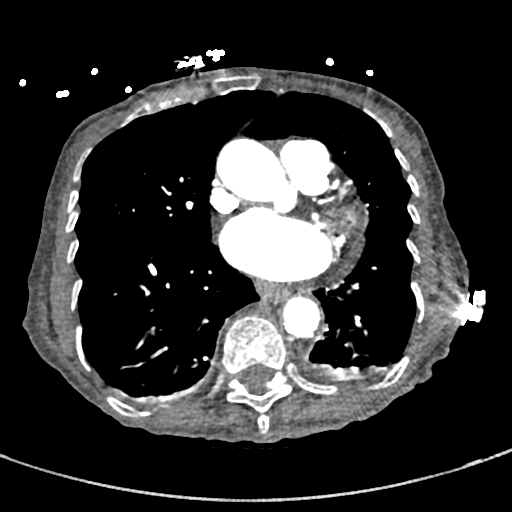
[im 210/378  lung]
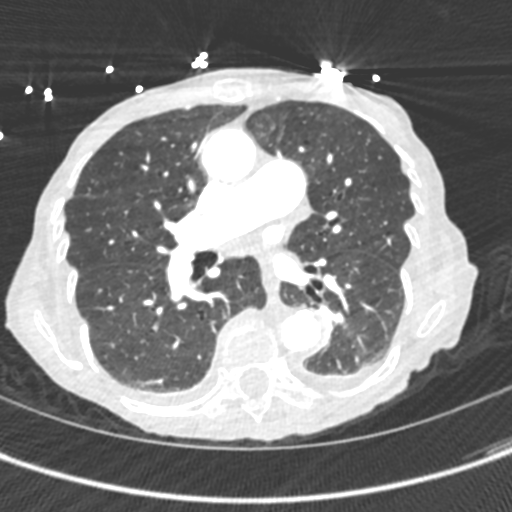
[im 231/378  soft-tissue]
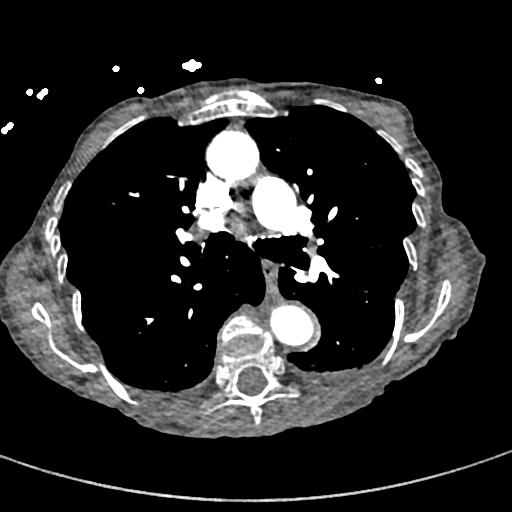
[im 252/378  lung]
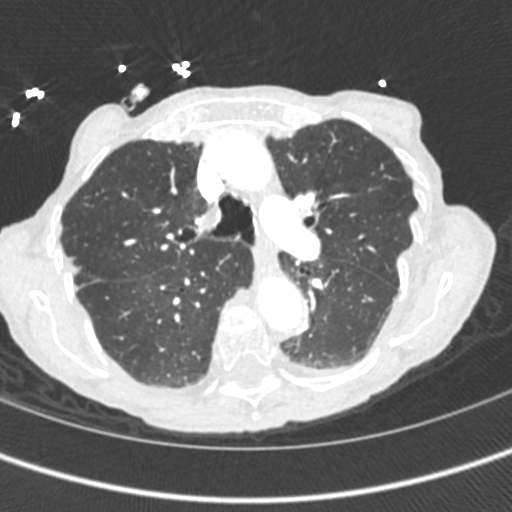
[im 273/378  soft-tissue]
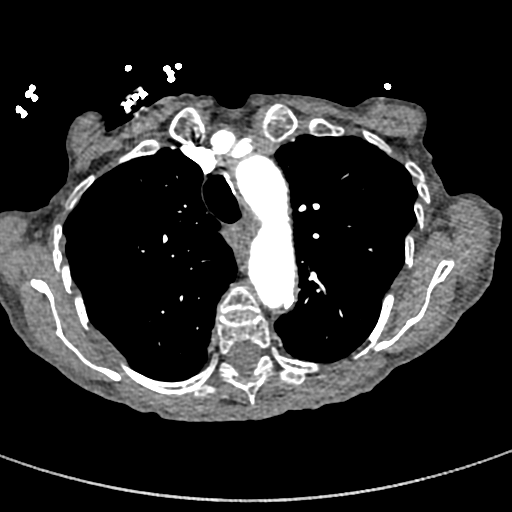
[im 294/378  lung]
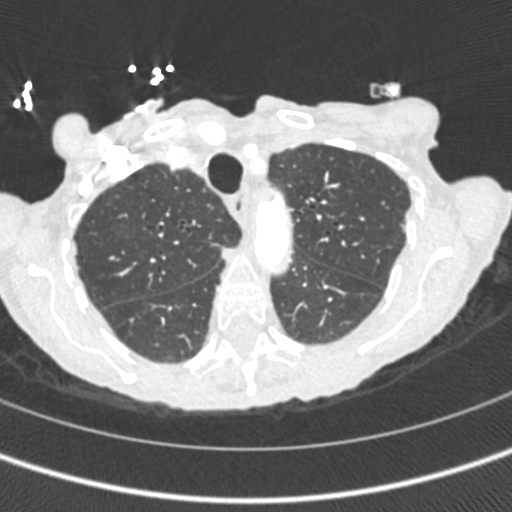
[im 315/378  soft-tissue]
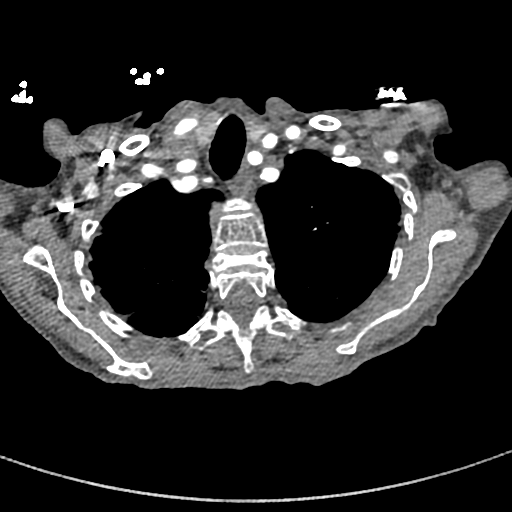
[im 336/378  lung]
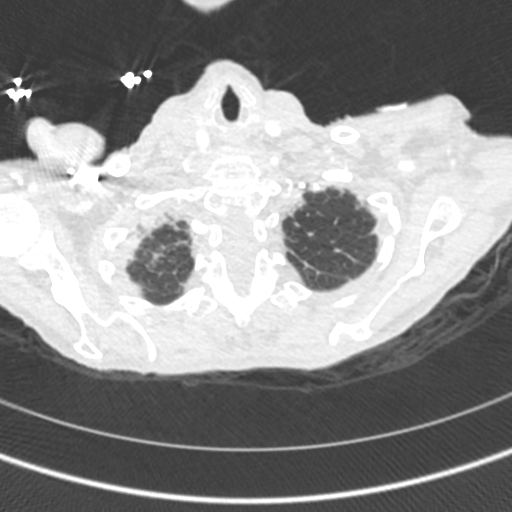
[im 357/378  soft-tissue]
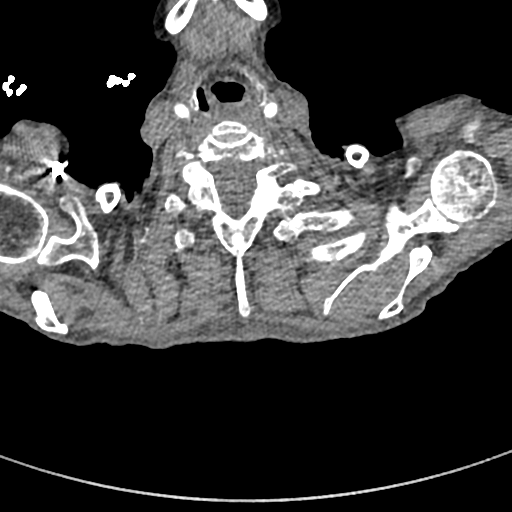

[Series 10: cor · coronal · 0.45mm/px · 3 of 104 slices shown]
[im 26/104  soft-tissue]
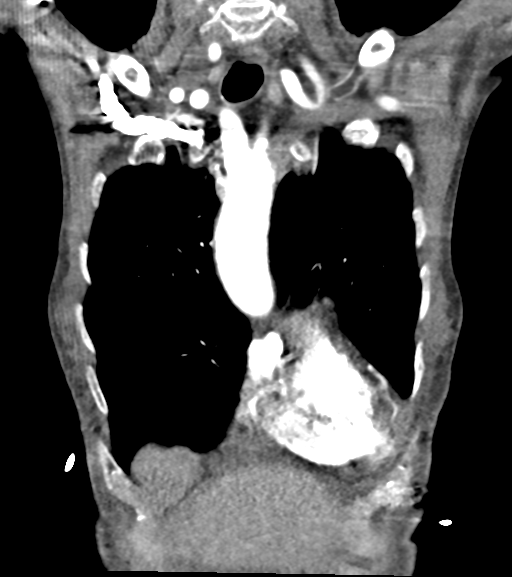
[im 52/104  soft-tissue]
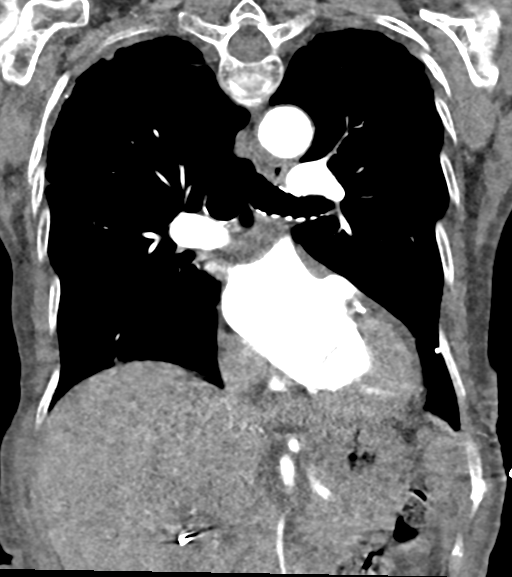
[im 78/104  soft-tissue]
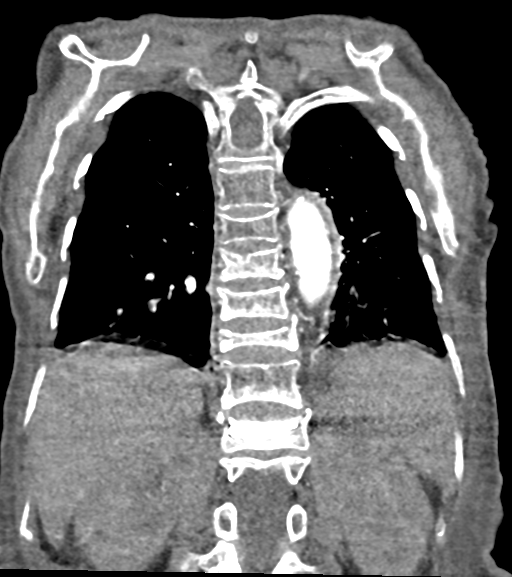

[19 of 46 positions shown; findings below may reference images not displayed]

FINDINGS: Cardiovascular: Satisfactory opacification of the pulmonary arteries
to the segmental level. No evidence of pulmonary embolism. Normal
heart size. No pericardial effusion. Atherosclerosis of thoracic
aorta is noted without aneurysm or dissection.

Mediastinum/Nodes: No enlarged mediastinal, hilar, or axillary lymph
nodes. Thyroid gland, trachea, and esophagus demonstrate no
significant findings.

Lungs/Pleura: No pneumothorax or significant pleural effusion is
noted. Minimal bilateral posterior basilar subsegmental atelectasis
is noted. Minimal biapical scarring is noted.

Upper Abdomen: No acute abnormality.

Musculoskeletal: No chest wall abnormality. No acute or significant
osseous findings.

Review of the MIP images confirms the above findings.
IMPRESSION: 1. No definite evidence of pulmonary embolus.
2. Aortic atherosclerosis.

Aortic Atherosclerosis ([id number]).

## 2021-05-27 ENCOUNTER — Emergency Department (HOSPITAL_COMMUNITY): Payer: Medicare (Managed Care)

## 2021-05-27 ENCOUNTER — Other Ambulatory Visit: Payer: Self-pay

## 2021-05-27 ENCOUNTER — Inpatient Hospital Stay (HOSPITAL_COMMUNITY): Payer: Medicare (Managed Care)

## 2021-05-27 ENCOUNTER — Encounter (HOSPITAL_COMMUNITY): Payer: Self-pay

## 2021-05-27 ENCOUNTER — Inpatient Hospital Stay (HOSPITAL_COMMUNITY)
Admission: EM | Admit: 2021-05-27 | Discharge: 2021-06-06 | DRG: 480 | Disposition: A | Discharge status: Hospice | Payer: Medicare (Managed Care) | Attending: Student | Admitting: Student

## 2021-05-27 DIAGNOSIS — S42191A Fracture of other part of scapula, right shoulder, initial encounter for closed fracture: Secondary | ICD-10-CM | POA: Diagnosis not present

## 2021-05-27 DIAGNOSIS — R1312 Dysphagia, oropharyngeal phase: Secondary | ICD-10-CM | POA: Diagnosis present

## 2021-05-27 DIAGNOSIS — M4850XA Collapsed vertebra, not elsewhere classified, site unspecified, initial encounter for fracture: Secondary | ICD-10-CM

## 2021-05-27 DIAGNOSIS — S42101A Fracture of unspecified part of scapula, right shoulder, initial encounter for closed fracture: Secondary | ICD-10-CM | POA: Diagnosis present

## 2021-05-27 DIAGNOSIS — I509 Heart failure, unspecified: Secondary | ICD-10-CM | POA: Diagnosis not present

## 2021-05-27 DIAGNOSIS — Z885 Allergy status to narcotic agent status: Secondary | ICD-10-CM | POA: Diagnosis not present

## 2021-05-27 DIAGNOSIS — D62 Acute posthemorrhagic anemia: Secondary | ICD-10-CM

## 2021-05-27 DIAGNOSIS — E876 Hypokalemia: Secondary | ICD-10-CM

## 2021-05-27 DIAGNOSIS — E43 Unspecified severe protein-calorie malnutrition: Secondary | ICD-10-CM | POA: Diagnosis present

## 2021-05-27 DIAGNOSIS — R1319 Other dysphagia: Secondary | ICD-10-CM | POA: Diagnosis not present

## 2021-05-27 DIAGNOSIS — E878 Other disorders of electrolyte and fluid balance, not elsewhere classified: Secondary | ICD-10-CM

## 2021-05-27 DIAGNOSIS — S72009A Fracture of unspecified part of neck of unspecified femur, initial encounter for closed fracture: Secondary | ICD-10-CM | POA: Diagnosis present

## 2021-05-27 DIAGNOSIS — R1310 Dysphagia, unspecified: Secondary | ICD-10-CM

## 2021-05-27 DIAGNOSIS — I429 Cardiomyopathy, unspecified: Secondary | ICD-10-CM | POA: Diagnosis present

## 2021-05-27 DIAGNOSIS — W01190A Fall on same level from slipping, tripping and stumbling with subsequent striking against furniture, initial encounter: Secondary | ICD-10-CM | POA: Diagnosis present

## 2021-05-27 DIAGNOSIS — R627 Adult failure to thrive: Secondary | ICD-10-CM | POA: Insufficient documentation

## 2021-05-27 DIAGNOSIS — S72141A Displaced intertrochanteric fracture of right femur, initial encounter for closed fracture: Principal | ICD-10-CM | POA: Diagnosis present

## 2021-05-27 DIAGNOSIS — Z8679 Personal history of other diseases of the circulatory system: Secondary | ICD-10-CM | POA: Diagnosis not present

## 2021-05-27 DIAGNOSIS — S72001A Fracture of unspecified part of neck of right femur, initial encounter for closed fracture: Secondary | ICD-10-CM | POA: Diagnosis not present

## 2021-05-27 DIAGNOSIS — R531 Weakness: Secondary | ICD-10-CM | POA: Diagnosis not present

## 2021-05-27 DIAGNOSIS — M4856XA Collapsed vertebra, not elsewhere classified, lumbar region, initial encounter for fracture: Secondary | ICD-10-CM | POA: Diagnosis present

## 2021-05-27 DIAGNOSIS — Z66 Do not resuscitate: Secondary | ICD-10-CM

## 2021-05-27 DIAGNOSIS — J45909 Unspecified asthma, uncomplicated: Secondary | ICD-10-CM | POA: Diagnosis present

## 2021-05-27 DIAGNOSIS — I10 Essential (primary) hypertension: Secondary | ICD-10-CM | POA: Diagnosis present

## 2021-05-27 DIAGNOSIS — R079 Chest pain, unspecified: Secondary | ICD-10-CM | POA: Diagnosis present

## 2021-05-27 DIAGNOSIS — N3289 Other specified disorders of bladder: Secondary | ICD-10-CM

## 2021-05-27 DIAGNOSIS — F05 Delirium due to known physiological condition: Secondary | ICD-10-CM | POA: Diagnosis not present

## 2021-05-27 DIAGNOSIS — R296 Repeated falls: Secondary | ICD-10-CM | POA: Diagnosis present

## 2021-05-27 DIAGNOSIS — Z681 Body mass index (BMI) 19 or less, adult: Secondary | ICD-10-CM | POA: Diagnosis not present

## 2021-05-27 DIAGNOSIS — S32000D Wedge compression fracture of unspecified lumbar vertebra, subsequent encounter for fracture with routine healing: Secondary | ICD-10-CM | POA: Diagnosis not present

## 2021-05-27 DIAGNOSIS — Y92129 Unspecified place in nursing home as the place of occurrence of the external cause: Secondary | ICD-10-CM

## 2021-05-27 DIAGNOSIS — Z791 Long term (current) use of non-steroidal anti-inflammatories (NSAID): Secondary | ICD-10-CM

## 2021-05-27 DIAGNOSIS — M810 Age-related osteoporosis without current pathological fracture: Secondary | ICD-10-CM | POA: Diagnosis present

## 2021-05-27 DIAGNOSIS — M4854XA Collapsed vertebra, not elsewhere classified, thoracic region, initial encounter for fracture: Secondary | ICD-10-CM | POA: Diagnosis present

## 2021-05-27 DIAGNOSIS — M8000XG Age-related osteoporosis with current pathological fracture, unspecified site, subsequent encounter for fracture with delayed healing: Secondary | ICD-10-CM | POA: Diagnosis not present

## 2021-05-27 DIAGNOSIS — Z515 Encounter for palliative care: Secondary | ICD-10-CM | POA: Diagnosis not present

## 2021-05-27 DIAGNOSIS — Z7982 Long term (current) use of aspirin: Secondary | ICD-10-CM

## 2021-05-27 DIAGNOSIS — B852 Pediculosis, unspecified: Secondary | ICD-10-CM | POA: Diagnosis present

## 2021-05-27 DIAGNOSIS — R410 Disorientation, unspecified: Secondary | ICD-10-CM | POA: Diagnosis not present

## 2021-05-27 DIAGNOSIS — S72101A Unspecified trochanteric fracture of right femur, initial encounter for closed fracture: Secondary | ICD-10-CM | POA: Diagnosis not present

## 2021-05-27 DIAGNOSIS — Z993 Dependence on wheelchair: Secondary | ICD-10-CM

## 2021-05-27 DIAGNOSIS — Z882 Allergy status to sulfonamides status: Secondary | ICD-10-CM

## 2021-05-27 DIAGNOSIS — I110 Hypertensive heart disease with heart failure: Secondary | ICD-10-CM | POA: Diagnosis not present

## 2021-05-27 DIAGNOSIS — E785 Hyperlipidemia, unspecified: Secondary | ICD-10-CM | POA: Diagnosis present

## 2021-05-27 DIAGNOSIS — F0390 Unspecified dementia without behavioral disturbance: Secondary | ICD-10-CM

## 2021-05-27 DIAGNOSIS — I2510 Atherosclerotic heart disease of native coronary artery without angina pectoris: Secondary | ICD-10-CM | POA: Diagnosis present

## 2021-05-27 DIAGNOSIS — Z91013 Allergy to seafood: Secondary | ICD-10-CM

## 2021-05-27 DIAGNOSIS — W1800XA Striking against unspecified object with subsequent fall, initial encounter: Secondary | ICD-10-CM | POA: Diagnosis not present

## 2021-05-27 DIAGNOSIS — Z881 Allergy status to other antibiotic agents status: Secondary | ICD-10-CM

## 2021-05-27 DIAGNOSIS — Z820 Family history of epilepsy and other diseases of the nervous system: Secondary | ICD-10-CM

## 2021-05-27 DIAGNOSIS — Z79899 Other long term (current) drug therapy: Secondary | ICD-10-CM

## 2021-05-27 DIAGNOSIS — Z7189 Other specified counseling: Secondary | ICD-10-CM | POA: Diagnosis not present

## 2021-05-27 DIAGNOSIS — Z8249 Family history of ischemic heart disease and other diseases of the circulatory system: Secondary | ICD-10-CM

## 2021-05-27 DIAGNOSIS — B888 Other specified infestations: Secondary | ICD-10-CM

## 2021-05-27 DIAGNOSIS — Z833 Family history of diabetes mellitus: Secondary | ICD-10-CM

## 2021-05-27 DIAGNOSIS — R54 Age-related physical debility: Secondary | ICD-10-CM

## 2021-05-27 LAB — CBC WITH DIFFERENTIAL/PLATELET
Abs Immature Granulocytes: 0.03 10*3/uL (ref 0.00–0.07)
Basophils Absolute: 0 10*3/uL (ref 0.0–0.1)
Basophils Relative: 0 %
Eosinophils Absolute: 0 10*3/uL (ref 0.0–0.5)
Eosinophils Relative: 1 %
HCT: 37.6 % (ref 36.0–46.0)
Hemoglobin: 12.6 g/dL (ref 12.0–15.0)
Immature Granulocytes: 1 %
Lymphocytes Relative: 17 %
Lymphs Abs: 1 10*3/uL (ref 0.7–4.0)
MCH: 31.6 pg (ref 26.0–34.0)
MCHC: 33.5 g/dL (ref 30.0–36.0)
MCV: 94.2 fL (ref 80.0–100.0)
Monocytes Absolute: 0.3 10*3/uL (ref 0.1–1.0)
Monocytes Relative: 6 %
Neutro Abs: 4.5 10*3/uL (ref 1.7–7.7)
Neutrophils Relative %: 75 %
Platelets: 269 10*3/uL (ref 150–400)
RBC: 3.99 MIL/uL (ref 3.87–5.11)
RDW: 11.9 % (ref 11.5–15.5)
WBC: 5.8 10*3/uL (ref 4.0–10.5)
nRBC: 0 % (ref 0.0–0.2)

## 2021-05-27 LAB — COMPREHENSIVE METABOLIC PANEL
ALT: 17 U/L (ref 0–44)
AST: 19 U/L (ref 15–41)
Albumin: 3.6 g/dL (ref 3.5–5.0)
Alkaline Phosphatase: 98 U/L (ref 38–126)
Anion gap: 7 (ref 5–15)
BUN: 16 mg/dL (ref 8–23)
CO2: 28 mmol/L (ref 22–32)
Calcium: 8.3 mg/dL — ABNORMAL LOW (ref 8.9–10.3)
Chloride: 105 mmol/L (ref 98–111)
Creatinine, Ser: 0.51 mg/dL (ref 0.44–1.00)
GFR, Estimated: >60 mL/min (ref >60)
Glucose, Bld: 143 mg/dL — ABNORMAL HIGH (ref 70–99)
Potassium: 3.2 mmol/L — ABNORMAL LOW (ref 3.5–5.1)
Sodium: 140 mmol/L (ref 135–145)
Total Bilirubin: 0.5 mg/dL (ref 0.3–1.2)
Total Protein: 6.3 g/dL — ABNORMAL LOW (ref 6.5–8.1)

## 2021-05-27 LAB — TROPONIN I (HIGH SENSITIVITY)
Troponin I (High Sensitivity): 4 ng/L (ref <18)
Troponin I (High Sensitivity): 4 ng/L (ref <18)

## 2021-05-27 LAB — CK: Total CK: 97 U/L (ref 38–234)

## 2021-05-27 MED ORDER — HYDROMORPHONE HCL 1 MG/ML IJ SOLN
1.0000 mg | Freq: Once | INTRAMUSCULAR | Status: AC
  Administered 2021-05-27: 1 mg via INTRAVENOUS
  Filled 2021-05-27: qty 1

## 2021-05-27 MED ORDER — OXYCODONE HCL 5 MG PO TABS
5.0000 mg | ORAL_TABLET | ORAL | Status: DC | PRN
  Administered 2021-05-28 – 2021-05-29 (×4): 5 mg via ORAL
  Filled 2021-05-27 (×4): qty 1

## 2021-05-27 MED ORDER — GABAPENTIN 100 MG PO CAPS
100.0000 mg | ORAL_CAPSULE | Freq: Every day | ORAL | Status: DC
Start: 2021-05-27 — End: 2021-06-06
  Administered 2021-05-27 – 2021-05-30 (×3): 100 mg via ORAL
  Filled 2021-05-27 (×6): qty 1

## 2021-05-27 MED ORDER — FENTANYL CITRATE PF 50 MCG/ML IJ SOSY
50.0000 ug | PREFILLED_SYRINGE | Freq: Once | INTRAMUSCULAR | Status: AC
  Administered 2021-05-27: 50 ug via INTRAVENOUS
  Filled 2021-05-27: qty 1

## 2021-05-27 MED ORDER — ACETAMINOPHEN 500 MG PO TABS
1000.0000 mg | ORAL_TABLET | Freq: Three times a day (TID) | ORAL | Status: DC
Start: 2021-05-27 — End: 2021-05-29
  Administered 2021-05-27 – 2021-05-28 (×3): 1000 mg via ORAL
  Filled 2021-05-27 (×5): qty 2

## 2021-05-27 MED ORDER — PANTOPRAZOLE SODIUM 40 MG PO TBEC
40.0000 mg | DELAYED_RELEASE_TABLET | Freq: Every day | ORAL | Status: DC
  Administered 2021-05-29 – 2021-06-03 (×4): 40 mg via ORAL
  Filled 2021-05-27 (×6): qty 1

## 2021-05-27 MED ORDER — ACETAMINOPHEN 325 MG PO TABS: 650.0000 mg | ORAL_TABLET | Freq: Four times a day (QID) | ORAL | Status: DC | PRN

## 2021-05-27 MED ORDER — ROSUVASTATIN CALCIUM 5 MG PO TABS
5.0000 mg | ORAL_TABLET | Freq: Every day | ORAL | Status: DC
  Administered 2021-05-29 – 2021-06-01 (×3): 5 mg via ORAL
  Filled 2021-05-27 (×4): qty 1

## 2021-05-27 MED ORDER — METOPROLOL TARTRATE 25 MG PO TABS
25.0000 mg | ORAL_TABLET | Freq: Two times a day (BID) | ORAL | Status: DC
  Administered 2021-05-27 – 2021-06-01 (×8): 25 mg via ORAL
  Filled 2021-05-27 (×9): qty 1

## 2021-05-27 MED ORDER — HYDROMORPHONE HCL 1 MG/ML IJ SOLN
1.0000 mg | INTRAMUSCULAR | Status: DC | PRN
  Administered 2021-05-27 – 2021-05-29 (×8): 1 mg via INTRAVENOUS
  Filled 2021-05-27 (×9): qty 1

## 2021-05-27 NOTE — Assessment & Plan Note (Deleted)
Delirium precautions 

## 2021-05-27 NOTE — Assessment & Plan Note (Addendum)
Noted on most form (not completely filled out - see media) and confirmed with son DNR paper issued on discharge

## 2021-05-27 NOTE — Assessment & Plan Note (Addendum)
Likely due to fall.  Plain film with angulated fx at inferior aspect of R scapula Per orthopedics - sling when up, WBAT -> needs f/u with upper extremity specialist

## 2021-05-27 NOTE — ED Notes (Signed)
ED TO INPATIENT HANDOFF REPORT  ED Nurse Name and Phone #: Collene Gobble Name/Age/Gender Julie Durham 81 y.o. female Room/Bed: WA10/WA10  Code Status   Code Status: Prior  Home/SNF/Other Skilled nursing facility Patient oriented to: self, place, and situation Is this baseline? Yes   Triage Complete: Triage complete  Chief Complaint Hip fracture [facility]) [S72.009A]  Triage Note [facility] reports pt coming from [place] for a fall. Pt c/o right hip pain going to right knee. No LOC. Staff reported bedbugs, EMS reports they saw a few in the bed.   Allergies Allergies  Allergen Reactions   Doxycycline Hyclate Anaphylaxis and Other (See Comments)    Chest pain   Shellfish Allergy Anaphylaxis   Demerol Other (See Comments)    Sedation   Sulfa Antibiotics Other (See Comments)    Unknown rxn    Level of Care/Admitting Diagnosis ED Disposition     ED Disposition  Admit   Condition  --   Comment  Hospital [facility]: [facility] [[id number]]  Level of Care: Med-Surg [16]  May admit patient to [facility] or [facility] if equivalent level of care is available:: No  Covid Evaluation: Asymptomatic - no recent exposure (last 10 days) testing not required  Diagnosis: Hip fracture [facility]) [[id number]]  Admitting Physician: Zigmund Daniel [phone]  Attending Physician: Shaune Spittle, A CALDWELL [phone]  Estimated length of stay: past midnight tomorrow  Certification:: I certify this patient will need inpatient services for at least 2 midnights          B Medical/Surgery History Past Medical History:  Diagnosis Date   Acute cystitis without hematuria    Acute encephalopathy 01/03/2019   Acute lower UTI 01/04/2019   Acute pain of right knee    Allergy    Anemia    Arthritis    Asthma    Brain mass    a. 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 Echo 2011: LVEF >55% w/ grade 1 DD, mild MR, mild TR, elevated RVSP 30-52mmHg   Mild tricuspid regurgitation    Oropharyngeal dysphagia 10/04/2016   Other bursal cyst, left elbow 10/04/2016   Palpitations    Pill esophagitis 10/04/2016   Stress fracture of lumbar vertebra 10/24/2018   Systolic dysfunction 10/24/2018   Nonischemic cardiomyopathy   Takotsubo cardiomyopathy    Takotsubo syndrome    Past Surgical History:  Procedure Laterality Date   APPENDECTOMY     CHOLECYSTECTOMY     DOPPLER ECHOCARDIOGRAPHY  2011   EYE SURGERY     KYPHOPLASTY Bilateral 10/22/2018   Procedure: KYPHOPLASTY LUMBAR ONE, LUMBAR FOUR;  Surgeon: Lisbeth Renshaw, MD;  Location: [facility] OR;  Service: Neurosurgery;  Laterality: Bilateral;  KYPHOPLASTY LUMBAR ONE, LUMBAR FOUR   LEFT HEART CATH AND CORONARY ANGIOGRAPHY N/A 10/23/2018   Procedure: LEFT HEART CATH AND CORONARY ANGIOGRAPHY;  Surgeon: Tonny Bollman, MD;  Location: [facility] INVASIVE CV LAB;  Service: Cardiovascular;  Laterality: N/A;   TUBAL LIGATION  A IV Location/Drains/Wounds Patient Lines/Drains/Airways Status     Active Line/Drains/Airways     Name Placement date Placement time Site Days   Peripheral IV 05/27/21 20 G 1" Anterior;Left Forearm 05/27/21  1454  Forearm  less than 1   Incision (Closed) 10/22/18 Back Other (Comment) 10/22/18  0900  -- 948            Intake/Output Last 24 hours No intake or output data in the 24 hours ending 05/27/21 1733  Labs/Imaging Results for orders placed or performed during the hospital encounter of 05/27/21 (from the past 48 hour(s))  Comprehensive metabolic panel     Status:  Abnormal   Collection Time: 05/27/21  2:31 PM  Result Value Ref Range   Sodium 140 135 - 145 mmol/L   Potassium 3.2 (L) 3.5 - 5.1 mmol/L   Chloride 105 98 - 111 mmol/L   CO2 28 22 - 32 mmol/L   Glucose, Bld 143 (H) 70 - 99 mg/dL    Comment: Glucose reference range applies only to samples taken after fasting for at least 8 hours.   BUN 16 8 - 23 mg/dL   Creatinine, Ser 0.09 0.44 - 1.00 mg/dL   Calcium 8.3 (L) 8.9 - 10.3 mg/dL   Total Protein 6.3 (L) 6.5 - 8.1 g/dL   Albumin 3.6 3.5 - 5.0 g/dL   AST 19 15 - 41 U/L   ALT 17 0 - 44 U/L   Alkaline Phosphatase 98 38 - 126 U/L   Total Bilirubin 0.5 0.3 - 1.2 mg/dL   GFR, Estimated >23 >30 mL/min    Comment: (NOTE) Calculated using the CKD-EPI Creatinine Equation (2021)    Anion gap 7 5 - 15    Comment: Performed at [facility], [address] [place], [place] [address]  CBC with Differential     Status: None   Collection Time: 05/27/21  2:31 PM  Result Value Ref Range   WBC 5.8 4.0 - 10.5 K/uL   RBC 3.99 3.87 - 5.11 MIL/uL   Hemoglobin 12.6 12.0 - 15.0 g/dL   HCT 63.3 35.4 - 56.2 %   MCV 94.2 80.0 - 100.0 fL   MCH 31.6 26.0 - 34.0 pg   MCHC 33.5 30.0 - 36.0 g/dL   RDW 56.3 89.3 - 73.4 %   Platelets 269 150 - 400 K/uL   nRBC 0.0 0.0 - 0.2 %   Neutrophils Relative % 75 %   Neutro Abs 4.5 1.7 - 7.7 K/uL   Lymphocytes Relative 17 %   Lymphs Abs 1.0 0.7 - 4.0 K/uL   Monocytes Relative 6 %   Monocytes Absolute 0.3 0.1 - 1.0 K/uL   Eosinophils Relative 1 %   Eosinophils Absolute 0.0 0.0 - 0.5 K/uL   Basophils Relative 0 %   Basophils Absolute 0.0 0.0 - 0.1 K/uL   Immature Granulocytes 1 %   Abs Immature Granulocytes 0.03 0.00 - 0.07 K/uL    Comment: Performed at [facility], [address], [place], [place] [address]   CT Head Wo Contrast  Result Date: 05/27/2021 CLINICAL DATA:  Head trauma. EXAM: CT HEAD WITHOUT CONTRAST TECHNIQUE: Contiguous axial images were obtained from the base of  the skull through the vertex without intravenous contrast. RADIATION DOSE REDUCTION: This exam was performed according to the departmental dose-optimization program which includes automated exposure control, adjustment of the mA and/or kV according to patient size and/or use of iterative reconstruction technique. COMPARISON:  January 04, 2019. FINDINGS: Brain: Mild diffuse cortical atrophy is noted. Mild chronic ischemic white matter disease is noted. No mass effect or midline shift is noted. Ventricular size is within normal limits. There is no evidence of mass lesion, hemorrhage or acute infarction. Vascular: No hyperdense vessel or unexpected calcification. Skull: Normal. Negative for fracture or focal lesion. Sinuses/Orbits: No acute finding. Other: None. IMPRESSION: No acute intracranial abnormality seen. Electronically Signed   By: Lupita RaiderJames  Green Jr M.D.   On: 05/27/2021 16:39   DG Chest Portable 1 View  Result Date: 05/27/2021 CLINICAL DATA:  Fall. EXAM: PORTABLE CHEST 1 VIEW COMPARISON:  February 26, 2021 FINDINGS: Elevation of the left hemidiaphragm is identified. The cardiomediastinal silhouette is normal. No pneumothorax. No nodules or masses. No focal infiltrates or overt edema. No acute abnormalities are identified. IMPRESSION: No active disease. Electronically Signed   By: Gerome Samavid  Williams III M.D.   On: 05/27/2021 16:09   DG Hip Unilat W or Wo Pelvis 2-3 Views Right  Result Date: 05/27/2021 CLINICAL DATA:  Fall. EXAM: DG HIP (WITH OR WITHOUT PELVIS) 2-3V RIGHT COMPARISON:  Right hip x-ray 07/12/2011. FINDINGS: There is an acute fracture of the right femoral neck. There is superolateral displacement of the distal fracture fragment. There is no dislocation. The bones are osteopenic. Vertebroplasty changes are seen in the approximate L4 level. There is likely a healed right inferior pubic ramus fracture. IMPRESSION: 1. Displaced right femoral neck fracture. Electronically Signed   By: Darliss CheneyAmy  Guttmann  M.D.   On: 05/27/2021 16:14    Pending Labs Unresulted Labs (From admission, onward)     Start     Ordered   05/27/21 1650  CK  Once,   R        05/27/21 1649            Vitals/Pain Today's Vitals   05/27/21 1515 05/27/21 1530 05/27/21 1545 05/27/21 1615  BP: (!) 181/83 (!) 198/91 (!) 181/81 (!) 147/77  Pulse: 94 94 98 80  Resp: (!) 22 15 (!) 21 17  Temp:      TempSrc:      SpO2: 91% 92% 95% 95%  Weight:      Height:      PainSc:        Isolation Precautions No active isolations  Medications Medications  acetaminophen (TYLENOL) tablet 1,000 mg (has no administration in time range)    Followed by  acetaminophen (TYLENOL) tablet 650 mg (has no administration in time range)  oxyCODONE (Oxy IR/ROXICODONE) immediate release tablet 5 mg (has no administration in time range)  HYDROmorphone (DILAUDID) injection 1 mg (has no administration in time range)  fentaNYL (SUBLIMAZE) injection 50 mcg (50 mcg Intravenous Given 05/27/21 1458)  HYDROmorphone (DILAUDID) injection 1 mg (1 mg Intravenous Given 05/27/21 1546)  HYDROmorphone (DILAUDID) injection 1 mg (1 mg Intravenous Given 05/27/21 1648)    Mobility walks with device High fall risk   Focused Assessments Cardiac Assessment Handoff:    Lab Results  Component Value Date   CKTOTAL 146 12/23/2018   No results found for: DDIMER Does the Patient currently have chest pain? No    R Recommendations: See Admitting Provider Note  Report given to:   Additional Notes:

## 2021-05-27 NOTE — Assessment & Plan Note (Addendum)
Stable

## 2021-05-27 NOTE — Assessment & Plan Note (Addendum)
Bed bugs reported on arrival, none noted during this hospitalization.

## 2021-05-27 NOTE — Assessment & Plan Note (Addendum)
Echo 05/2019 with EF [id number], grade 1 diastolic dysfunction.  Appears dry on exam.  Not on diuretics at home.  Was on IV fluid

## 2021-05-27 NOTE — Assessment & Plan Note (Addendum)
Body mass index is 13.13 kg/m.  She has very advanced dementia complicated by in-hospital delirium and refusal to take p.o. very poor prognosis.

## 2021-05-27 NOTE — Assessment & Plan Note (Addendum)
Limited hx with dementia Low suspicion cardiac, r/o ACS Difficult with her dementia and uncontrolled pain Follow EKG and troponin (negative troponin x2)

## 2021-05-27 NOTE — ED Provider Notes (Signed)
[facility] Provider Note  CSN: [id number] Arrival date & time: 05/27/21 1417  Chief Complaint(s) Fall  HPI Julie Durham is a 81 y.o. female with PMH VZV encephalitis, previous Takotsubo cardiomyopathy with return of normal EF, HTN who presents emergency department for evaluation of a fall with hip pain.  Patient arrives from [place] and states that she suffered a mechanical fall tripping over the carpet and striking her hip on a table and on the ground.  No loss of consciousness.  There was initial concern for possible bedbugs but these were not seen in the emergency department.  Patient arrives with significant tenderness to the left hip that radiates down into the left knee.   Past Medical History Past Medical History:  Diagnosis Date   Acute cystitis without hematuria    Acute encephalopathy 01/03/2019   Acute lower UTI 01/04/2019   Acute pain of right knee    Allergy    Anemia    Arthritis    Asthma    Brain mass    a. 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   Cataract    Elevated troponin    Encephalitis, viral    a. [facility]2018: [facility]   Hyperlipidemia    Hypertension    Lumbar compression fracture ([name]) 10/22/2018   Mild CAD per cath 10/2018    Mild mitral regurgitation    a. Echo 2011: LVEF >55% w/ grade 1 DD, mild MR, mild TR, elevated RVSP 30-1140mmHg   Mild tricuspid regurgitation    Oropharyngeal dysphagia 10/04/2016   Other bursal cyst, left elbow 10/04/2016   Palpitations    Pill esophagitis 10/04/2016   Stress fracture of lumbar vertebra 10/24/2018   Systolic dysfunction 10/24/2018   Nonischemic cardiomyopathy   Takotsubo cardiomyopathy    Takotsubo syndrome    Patient Active Problem List   Diagnosis Date Noted    Hip fracture ([name]) 05/27/2021   Febrile 08/07/2019   Acute lower UTI 01/04/2019   Acute cystitis without hematuria    Acute encephalopathy 01/03/2019   Protein-calorie malnutrition, severe 12/24/2018   Acute pain of right knee    Closed right tibial fracture 12/23/2018   Mild CAD 10/24/2018   S/P kyphoplasty 10/24/2018   Stress fracture of lumbar vertebra 10/24/2018   Systolic dysfunction 10/24/2018   Elevated troponin 10/24/2018   Chest pain  10/24/2018   Brain mass 10/24/2018   Takotsubo syndrome    Lumbar compression fracture ([name]) 10/22/2018   Hyperlipidemia 02/28/2017   Oropharyngeal dysphagia 10/04/2016   Other bursal cyst, left elbow 10/04/2016   Pill esophagitis 10/04/2016   Infected insect bite 06/29/2016   Retained foreign body in soft tissue 06/29/2016   Essential hypertension 11/18/2012   Palpitations 11/18/2012   Home Medication(s) Prior to Admission medications   Medication Sig Start Date End Date Taking? Authorizing Provider  acetaminophen (TYLENOL) 500 MG tablet Take 1 tablet (500 mg total) by mouth every 6 (six) hours as needed for mild pain or moderate pain. 01/08/19   Hongalgi, Maximino GreenlandAnand D, MD  amLODipine (NORVASC) 5 MG tablet Take 5 mg by mouth daily.    [provider]  aspirin EC 81 MG tablet Take 81 mg by mouth daily.     [provider]  Calcium Carbonate-Vitamin D (CALCIUM 600+D) 600-400 MG-UNIT tablet Take 1  tablet by mouth 2 (two) times daily.    [provider]  cetirizine (ZYRTEC) 10 MG tablet Take 10 mg by mouth daily.    [provider]  feeding supplement, ENSURE ENLIVE, (ENSURE ENLIVE) LIQD Take 237 mLs by mouth 3 (three) times daily between meals. 01/08/19   Hongalgi, Maximino Greenland, MD  fluticasone (FLONASE) 50 MCG/ACT nasal spray Place 1 spray into both nostrils daily.    [provider]  irbesartan (AVAPRO) 150 MG tablet Take 1 tablet (150 mg total) by mouth daily. 10/21/18   Marjie Skiff E, PA-C   metoprolol tartrate (LOPRESSOR) 25 MG tablet Take 12.5 mg by mouth 2 (two) times daily.    [provider]  Multiple Vitamins-Minerals (OCUVITE EXTRA) TABS Take 1 tablet by mouth 2 (two) times daily.    [provider]  rosuvastatin (CRESTOR) 5 MG tablet Take 5 mg by mouth daily. 01/27/19   [provider]                                                                                                                                    Past Surgical History Past Surgical History:  Procedure Laterality Date   APPENDECTOMY     CHOLECYSTECTOMY     DOPPLER ECHOCARDIOGRAPHY  2011   EYE SURGERY     KYPHOPLASTY Bilateral 10/22/2018   Procedure: KYPHOPLASTY LUMBAR ONE, LUMBAR FOUR;  Surgeon: Lisbeth Renshaw, MD;  Location: [facility] OR;  Service: Neurosurgery;  Laterality: Bilateral;  KYPHOPLASTY LUMBAR ONE, LUMBAR FOUR   LEFT HEART CATH AND CORONARY ANGIOGRAPHY N/A 10/23/2018   Procedure: LEFT HEART CATH AND CORONARY ANGIOGRAPHY;  Surgeon: Tonny Bollman, MD;  Location: [facility] CV LAB;  Service: Cardiovascular;  Laterality: N/A;   TUBAL LIGATION     Family History Family History  Problem Relation Age of Onset   Alzheimer's disease Mother    Heart attack Father    Heart disease Brother    Diabetes Son    Sensorineural hearing loss Sister    Heart disease Brother    Heart disease Brother    Thyroid disease Brother     Social History Social History   Tobacco Use   Smoking status: Never   Smokeless tobacco: Never  Vaping Use   Vaping Use: Never used  Substance Use Topics   Alcohol use: Never   Drug use: Never   Allergies Doxycycline hyclate, Shellfish allergy, Demerol, and Sulfa antibiotics  Review of Systems Review of Systems  Musculoskeletal:  Positive for arthralgias.   Physical Exam Vital Signs  I have reviewed the triage vital signs BP (!) 147/77   Pulse 80   Temp 97.9 F (36.6 C) (Oral)   Resp 17   Ht  (1.499 m)   Wt 29.5 kg    SpO2 95%   BMI 13.13 kg/m   Physical Exam Vitals and nursing note reviewed.  Constitutional:      General:  She is not in acute distress.    Appearance: She is well-developed.  HENT:     Head: Normocephalic and atraumatic.  Eyes:     Conjunctiva/sclera: Conjunctivae normal.  Cardiovascular:     Rate and Rhythm: Normal rate and regular rhythm.     Heart sounds: No murmur heard. Pulmonary:     Effort: Pulmonary effort is normal. No respiratory distress.     Breath sounds: Normal breath sounds.  Abdominal:     Palpations: Abdomen is soft.     Tenderness: There is no abdominal tenderness.  Musculoskeletal:        General: Swelling, tenderness and deformity present.     Cervical back: Neck supple.  Skin:    General: Skin is warm and dry.     Capillary Refill: Capillary refill takes less than 2 seconds.  Neurological:     Mental Status: She is alert.  Psychiatric:        Mood and Affect: Mood normal.    ED Results and Treatments Labs (all labs ordered are listed, but only abnormal results are displayed) Labs Reviewed  COMPREHENSIVE METABOLIC PANEL - Abnormal; Notable for the following components:      Result Value   Potassium 3.2 (*)    Glucose, Bld 143 (*)    Calcium 8.3 (*)    Total Protein 6.3 (*)    All other components within normal limits  CBC WITH DIFFERENTIAL/PLATELET  CK                                                                                                                          Radiology CT Head Wo Contrast  Result Date: 05/27/2021 CLINICAL DATA:  Head trauma. EXAM: CT HEAD WITHOUT CONTRAST TECHNIQUE: Contiguous axial images were obtained from the base of the skull through the vertex without intravenous contrast. RADIATION DOSE REDUCTION: This exam was performed according to the departmental dose-optimization program which includes automated exposure control, adjustment of the mA and/or kV according to patient size and/or use of iterative  reconstruction technique. COMPARISON:  January 04, 2019. FINDINGS: Brain: Mild diffuse cortical atrophy is noted. Mild chronic ischemic white matter disease is noted. No mass effect or midline shift is noted. Ventricular size is within normal limits. There is no evidence of mass lesion, hemorrhage or acute infarction. Vascular: No hyperdense vessel or unexpected calcification. Skull: Normal. Negative for fracture or focal lesion. Sinuses/Orbits: No acute finding. Other: None. IMPRESSION: No acute intracranial abnormality seen. Electronically Signed   By: Lupita Raider M.D.   On: 05/27/2021 16:39   DG Chest Portable 1 View  Result Date: 05/27/2021 CLINICAL DATA:  Fall. EXAM: PORTABLE CHEST 1 VIEW COMPARISON:  February 26, 2021 FINDINGS: Elevation of the left hemidiaphragm is identified. The cardiomediastinal silhouette is normal. No pneumothorax. No nodules or masses. No focal infiltrates or overt edema. No acute abnormalities are identified. IMPRESSION: No active disease. Electronically Signed   By: Gerome Sam III M.D.  On: 05/27/2021 16:09   DG Hip Unilat W or Wo Pelvis 2-3 Views Right  Result Date: 05/27/2021 CLINICAL DATA:  Fall. EXAM: DG HIP (WITH OR WITHOUT PELVIS) 2-3V RIGHT COMPARISON:  Right hip x-ray 07/12/2011. FINDINGS: There is an acute fracture of the right femoral neck. There is superolateral displacement of the distal fracture fragment. There is no dislocation. The bones are osteopenic. Vertebroplasty changes are seen in the approximate L4 level. There is likely a healed right inferior pubic ramus fracture. IMPRESSION: 1. Displaced right femoral neck fracture. Electronically Signed   By: Darliss Cheney M.D.   On: 05/27/2021 16:14    Pertinent labs & imaging results that were available during my care of the patient were reviewed by me and considered in my medical decision making (see MDM for details).  Medications Ordered in ED Medications  acetaminophen (TYLENOL) tablet 1,000 mg  (has no administration in time range)    Followed by  acetaminophen (TYLENOL) tablet 650 mg (has no administration in time range)  oxyCODONE (Oxy IR/ROXICODONE) immediate release tablet 5 mg (has no administration in time range)  HYDROmorphone (DILAUDID) injection 1 mg (has no administration in time range)  fentaNYL (SUBLIMAZE) injection 50 mcg (50 mcg Intravenous Given 05/27/21 1458)  HYDROmorphone (DILAUDID) injection 1 mg (1 mg Intravenous Given 05/27/21 1546)  HYDROmorphone (DILAUDID) injection 1 mg (1 mg Intravenous Given 05/27/21 1648)                                                                                                                                     Procedures Procedures  (including critical care time)  Medical Decision Making / ED Course   This patient presents to the ED for concern of hip pain after fall, this involves an extensive number of treatment options, and is a complaint that carries with it a high risk of complications and morbidity.  The differential diagnosis includes fracture, ligamentous injury, dislocation, electrolyte abnormality  MDM: Patient seen emergency room for evaluation of a fall with right hip pain.  Physical exam with tenderness and a obvious deformity to the right hip and patient's position of comfort is with knee-to-chest.  Patient will not allow me to extend the leg.  X-ray imaging with a displaced femoral neck fracture.  Laboratory evaluation with a hypokalemia to 3.2 but is otherwise unremarkable.  CT head unremarkable.  X-ray knee unable to be obtained due to patient positioning.  Chest x-ray unremarkable.  Orthopedics consulted and spoke with Dr. Shon Baton who will evaluate the patient for likely surgery.  Patient then admitted to medicine.   Additional history obtained:  -External records from outside source obtained and reviewed including: Chart review including previous notes, labs, imaging, consultation notes   Lab Tests: -I ordered,  reviewed, and interpreted labs.   The pertinent results include:   Labs Reviewed  COMPREHENSIVE METABOLIC PANEL - Abnormal; Notable for the following components:  Result Value   Potassium 3.2 (*)    Glucose, Bld 143 (*)    Calcium 8.3 (*)    Total Protein 6.3 (*)    All other components within normal limits  CBC WITH DIFFERENTIAL/PLATELET  CK     Imaging Studies ordered: I ordered imaging studies including CT head, x-ray chest, x-ray hip I independently visualized and interpreted imaging. I agree with the radiologist interpretation   Medicines ordered and prescription drug management: Meds ordered this encounter  Medications   fentaNYL (SUBLIMAZE) injection 50 mcg   HYDROmorphone (DILAUDID) injection 1 mg   HYDROmorphone (DILAUDID) injection 1 mg   FOLLOWED BY Linked Order Group    acetaminophen (TYLENOL) tablet 1,000 mg    acetaminophen (TYLENOL) tablet 650 mg   oxyCODONE (Oxy IR/ROXICODONE) immediate release tablet 5 mg   HYDROmorphone (DILAUDID) injection 1 mg    -I have reviewed the patients home medicines and have made adjustments as needed  Critical interventions none  Consultations Obtained: I requested consultation with the orthopedic surgeon,  and discussed lab and imaging findings as well as pertinent plan - they recommend: Medicine admit and likely surgery   Cardiac Monitoring: The patient was maintained on a cardiac monitor.  I personally viewed and interpreted the cardiac monitored which showed an underlying rhythm of: NSR  Social Determinants of Health:  Factors impacting patients care include: None   Reevaluation: After the interventions noted above, I reevaluated the patient and found that they have :improved  Co morbidities that complicate the patient evaluation  Past Medical History:  Diagnosis Date   Acute cystitis without hematuria    Acute encephalopathy 01/03/2019   Acute lower UTI 01/04/2019   Acute pain of right knee    Allergy     Anemia    Arthritis    Asthma    Brain mass    a. 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   Cataract    Elevated troponin    Encephalitis, viral    a. 08/2018: diagnosed with VZV encephalitis at [facility]   Hyperlipidemia    Hypertension    Lumbar compression fracture ([name]) 10/22/2018   Mild CAD per cath 10/2018    Mild mitral regurgitation    a. Echo 2011: LVEF >55% w/ grade 1 DD, mild MR, mild TR, elevated RVSP 30-41mmHg   Mild tricuspid regurgitation    Oropharyngeal dysphagia 10/04/2016   Other bursal cyst, left elbow 10/04/2016   Palpitations    Pill esophagitis 10/04/2016   Stress fracture of lumbar vertebra 10/24/2018   Systolic dysfunction 10/24/2018   Nonischemic cardiomyopathy   Takotsubo cardiomyopathy    Takotsubo syndrome       Dispostion: I considered admission for this patient, and due to hip fracture she will require admission     Final Clinical Impression(s) / ED Diagnoses Final diagnoses:  None     @PCDICTATION @    , MD 05/27/21 1725

## 2021-05-27 NOTE — Assessment & Plan Note (Addendum)
Fell hitting hip on table and on ground with subsequent right hip and scapular fracture.  -Pain control

## 2021-05-27 NOTE — Consult Note (Signed)
Chief Complaint: Status post fall with right hip fracture History: Julie Durham is a 81 y.o. female with PMH VZV encephalitis, previous Takotsubo cardiomyopathy with return of normal EF, HTN who presents emergency department for evaluation of a fall with hip pain.  Patient arrives from [place] and states that she suffered a mechanical fall tripping over the carpet and striking her hip on a table and on the ground.  No loss of consciousness.  Patient arrives with significant tenderness to the right hip that radiates down into the right knee.  Review of systems: No recent fevers, chills.  No recent loss of consciousness   Past Medical History:  Diagnosis Date   Acute cystitis without hematuria    Acute encephalopathy 01/03/2019   Acute lower UTI 01/04/2019   Acute pain of right knee    Allergy    Anemia    Arthritis    Asthma    Brain mass    a. 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   Cataract    Elevated troponin    Encephalitis, viral    a. 08/2018: diagnosed with V[facility] encephal[facility] at [facility]   Hyperlipidemia    Hypertension    Lumbar compression fracture ([place]) 10/22/2018   Mild CAD per cath 10/2018    Mild mitral regurgitation    a. Echo 2011: LVEF >55% w/ grade 1 DD, mild MR, mild TR, elevated RVSP 30-3mmHg   Mild tricuspid regurgitation    Oropharyngeal dysphagia 10/04/2016   Other bursal cyst, left elbow 10/04/2016   Palpitations    Pill esophagitis 10/04/2016   Stress fracture of lumbar vertebra 123456   Systolic dysfunction 123456   Nonischemic cardiomyopathy   Takotsubo cardiomyopathy    Takotsubo syndrome     Allergies  Allergen Reactions   Doxycycline Hyclate Anaphylaxis and Other (See Comments)    Chest pain   Shellfish Allergy Anaphylaxis   Demerol Other (See  Comments)    Sedation   Sulfa Antibiotics Other (See Comments)    Unknown rxn    No current facility-administered medications on file prior to encounter.   Current Outpatient Medications on File Prior to Encounter  Medication Sig Dispense Refill   acetaminophen (TYLENOL) 500 MG tablet Take 1 tablet (500 mg total) by mouth every 6 (six) hours as needed for mild pain or moderate pain.     amLODipine (NORVASC) 5 MG tablet Take 5 mg by mouth daily.     aspirin EC 81 MG tablet Take 81 mg by mouth daily.      Calcium Carbonate-Vitamin D (CALCIUM 600+D) 600-400 MG-UNIT tablet Take 1 tablet by mouth 2 (two) times daily.     cetirizine (ZYRTEC) 10 MG tablet Take 10 mg by mouth daily.     feeding supplement, ENSURE ENLIVE, (ENSURE ENLIVE) LIQD Take 237 mLs by mouth 3 (three) times daily between meals.     fluticasone (FLONASE) 50 MCG/ACT nasal spray Place 1 spray into both nostrils daily.     irbesartan (AVAPRO) 150 MG tablet Take 1 tablet (150 mg total) by mouth daily. 90 tablet 3   metoprolol tartrate (LOPRESSOR) 25 MG tablet Take 12.5 mg by mouth 2 (two) times daily.     Multiple Vitamins-Minerals (OCUVITE EXTRA) TABS Take 1 tablet by mouth 2 (two) times daily.     rosuvastatin (CRESTOR) 5 MG  tablet Take 5 mg by mouth daily.      Physical Exam: Vitals:   05/27/21 1545 05/27/21 1615  BP: (!) 181/81 (!) 147/77  Pulse: 98 80  Resp: (!) 21 17  Temp:    SpO2: 95% 95%   Body mass index is 13.13 kg/m. Vitals and nursing note reviewed.  Constitutional:      General: She is not in acute distress.    Appearance: She is well-developed.  HENT:     Head: Normocephalic and atraumatic.  Eyes:     Conjunctiva/sclera: Conjunctivae normal.  Cardiovascular:     Rate and Rhythm: Normal rate and regular rhythm.     Heart sounds: No murmur heard. Pulmonary:     Effort: Pulmonary effort is normal. No respiratory distress.     Breath sounds: Normal breath sounds.  Abdominal:     Palpations: Abdomen  is soft.     Tenderness: There is no abdominal tenderness.  Musculoskeletal:        General: Swelling, tenderness and deformity present.     Cervical back: Neck supple.  Obvious deformity of the right lower extremity.  Significant pain with any attempted hip palpation or range of motion at the hip or knee. Pelvis appears to be stable with no gross motion with anterior or lateral compression Patient is able to move ankle and feet to command.  EHL/tibialis anterior/gastrocnemius appear to be intact.  Sensation light touch is intact in the lower extremity. Compartments are soft and nontender.  2+ dorsalis pedis/posterior tibialis pulses bilaterally Skin:    General: Skin is warm and dry.     Capillary Refill: Capillary refill takes less than 2 seconds.  Neurological:     Mental Status: She is alert.  Psychiatric:        Mood and Affect: Mood normal.   Image: CT Head Wo Contrast  Result Date: 05/27/2021 CLINICAL DATA:  Head trauma. EXAM: CT HEAD WITHOUT CONTRAST TECHNIQUE: Contiguous axial images were obtained from the base of the skull through the vertex without intravenous contrast. RADIATION DOSE REDUCTION: This exam was performed according to the departmental dose-optimization program which includes automated exposure control, adjustment of the mA and/or kV according to patient size and/or use of iterative reconstruction technique. COMPARISON:  January 04, 2019. FINDINGS: Brain: Mild diffuse cortical atrophy is noted. Mild chronic ischemic white matter disease is noted. No mass effect or midline shift is noted. Ventricular size is within normal limits. There is no evidence of mass lesion, hemorrhage or acute infarction. Vascular: No hyperdense vessel or unexpected calcification. Skull: Normal. Negative for fracture or focal lesion. Sinuses/Orbits: No acute finding. Other: None. IMPRESSION: No acute intracranial abnormality seen. Electronically Signed   By: Marijo Conception M.D.   On: 05/27/2021  16:39   DG Chest Portable 1 View  Result Date: 05/27/2021 CLINICAL DATA:  Fall. EXAM: PORTABLE CHEST 1 VIEW COMPARISON:  February 26, 2021 FINDINGS: Elevation of the left hemidiaphragm is identified. The cardiomediastinal silhouette is normal. No pneumothorax. No nodules or masses. No focal infiltrates or overt edema. No acute abnormalities are identified. IMPRESSION: No active disease. Electronically Signed   By: Dorise Bullion III M.D.   On: 05/27/2021 16:09   DG Hip Unilat W or Wo Pelvis 2-3 Views Right  Result Date: 05/27/2021 CLINICAL DATA:  Fall. EXAM: DG HIP (WITH OR WITHOUT PELVIS) 2-3V RIGHT COMPARISON:  Right hip x-ray 07/12/2011. FINDINGS: There is an acute fracture of the right femoral neck. There is superolateral displacement of the  distal fracture fragment. There is no dislocation. The bones are osteopenic. Vertebroplasty changes are seen in the approximate L4 level. There is likely a healed right inferior pubic ramus fracture. IMPRESSION: 1. Displaced right femoral neck fracture. Electronically Signed   By: Ronney Asters M.D.   On: 05/27/2021 16:14    A/P: This is an 82 year old woman who unfortunately fell and has a right displaced femoral neck hip fracture.  Patient complains of significant pain and is unable to ambulate.  I have discussed the case with Dr.Swintech who will be available to perform definitive fracture treatment.  Patient will be admitted to the medical service.  Orthopedics will continue to follow.  Further recommendations pending Dr. Sid Falcon review.

## 2021-05-27 NOTE — Assessment & Plan Note (Addendum)
5/21-IM fixation right femur

## 2021-05-27 NOTE — Plan of Care (Signed)
  Problem: Coping: Goal: Level of anxiety will decrease Outcome: Progressing   Problem: Pain Managment: Goal: General experience of comfort will improve Outcome: Progressing   Problem: Safety: Goal: Ability to remain free from injury will improve Outcome: Progressing   Problem: Skin Integrity: Goal: Risk for impaired skin integrity will decrease Outcome: Progressing   

## 2021-05-27 NOTE — H&P (View-Only) (Signed)
Chief Complaint: Status post fall with right hip fracture History: Julie Durham is a 82 y.o. female with PMH VZV encephalitis, previous Takotsubo cardiomyopathy with return of normal EF, HTN who presents emergency department for evaluation of a fall with hip pain.  Patient arrives from [place] and states that she suffered a mechanical fall tripping over the carpet and striking her hip on a table and on the ground.  No loss of consciousness.  Patient arrives with significant tenderness to the right hip that radiates down into the right knee.  Review of systems: No recent fevers, chills.  No recent loss of consciousness   Past Medical History:  Diagnosis Date   Acute cystitis without hematuria    Acute encephalopathy 01/03/2019   Acute lower UTI 01/04/2019   Acute pain of right knee    Allergy    Anemia    Arthritis    Asthma    Brain mass    a. 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   Cataract    Elevated troponin    Encephalitis, viral    a. 08/2018: diagnosed with VZV encephalitis at [facility]   Hyperlipidemia    Hypertension    Lumbar compression fracture ([place]) 10/22/2018   Mild CAD per cath 10/2018    Mild mitral regurgitation    a. Echo 2011: LVEF >55% w/ grade 1 DD, mild MR, mild TR, elevated RVSP 30-68mmHg   Mild tricuspid regurgitation    Oropharyngeal dysphagia 10/04/2016   Other bursal cyst, left elbow 10/04/2016   Palpitations    Pill esophagitis 10/04/2016   Stress fracture of lumbar vertebra 123456   Systolic dysfunction 123456   Nonischemic cardiomyopathy   Takotsubo cardiomyopathy    Takotsubo syndrome     Allergies  Allergen Reactions   Doxycycline Hyclate Anaphylaxis and Other (See Comments)    Chest pain   Shellfish Allergy Anaphylaxis   Demerol Other (See  Comments)    Sedation   Sulfa Antibiotics Other (See Comments)    Unknown rxn    No current facility-administered medications on file prior to encounter.   Current Outpatient Medications on File Prior to Encounter  Medication Sig Dispense Refill   acetaminophen (TYLENOL) 500 MG tablet Take 1 tablet (500 mg total) by mouth every 6 (six) hours as needed for mild pain or moderate pain.     amLODipine (NORVASC) 5 MG tablet Take 5 mg by mouth daily.     aspirin EC 81 MG tablet Take 81 mg by mouth daily.      Calcium Carbonate-Vitamin D (CALCIUM 600+D) 600-400 MG-UNIT tablet Take 1 tablet by mouth 2 (two) times daily.     cetirizine (ZYRTEC) 10 MG tablet Take 10 mg by mouth daily.     feeding supplement, ENSURE ENLIVE, (ENSURE ENLIVE) LIQD Take 237 mLs by mouth 3 (three) times daily between meals.     fluticasone (FLONASE) 50 MCG/ACT nasal spray Place 1 spray into both nostrils daily.     irbesartan (AVAPRO) 150 MG tablet Take 1 tablet (150 mg total) by mouth daily. 90 tablet 3   metoprolol tartrate (LOPRESSOR) 25 MG tablet Take 12.5 mg by mouth 2 (two) times daily.     Multiple Vitamins-Minerals (OCUVITE EXTRA) TABS Take 1 tablet by mouth 2 (two) times daily.     rosuvastatin (CRESTOR) 5 MG  tablet Take 5 mg by mouth daily.      Physical Exam: Vitals:   05/27/21 1545 05/27/21 1615  BP: (!) 181/81 (!) 147/77  Pulse: 98 80  Resp: (!) 21 17  Temp:    SpO2: 95% 95%   Body mass index is 13.13 kg/m. Vitals and nursing note reviewed.  Constitutional:      General: She is not in acute distress.    Appearance: She is well-developed.  HENT:     Head: Normocephalic and atraumatic.  Eyes:     Conjunctiva/sclera: Conjunctivae normal.  Cardiovascular:     Rate and Rhythm: Normal rate and regular rhythm.     Heart sounds: No murmur heard. Pulmonary:     Effort: Pulmonary effort is normal. No respiratory distress.     Breath sounds: Normal breath sounds.  Abdominal:     Palpations: Abdomen  is soft.     Tenderness: There is no abdominal tenderness.  Musculoskeletal:        General: Swelling, tenderness and deformity present.     Cervical back: Neck supple.  Obvious deformity of the right lower extremity.  Significant pain with any attempted hip palpation or range of motion at the hip or knee. Pelvis appears to be stable with no gross motion with anterior or lateral compression Patient is able to move ankle and feet to command.  EHL/tibialis anterior/gastrocnemius appear to be intact.  Sensation light touch is intact in the lower extremity. Compartments are soft and nontender.  2+ dorsalis pedis/posterior tibialis pulses bilaterally Skin:    General: Skin is warm and dry.     Capillary Refill: Capillary refill takes less than 2 seconds.  Neurological:     Mental Status: She is alert.  Psychiatric:        Mood and Affect: Mood normal.   Image: CT Head Wo Contrast  Result Date: 05/27/2021 CLINICAL DATA:  Head trauma. EXAM: CT HEAD WITHOUT CONTRAST TECHNIQUE: Contiguous axial images were obtained from the base of the skull through the vertex without intravenous contrast. RADIATION DOSE REDUCTION: This exam was performed according to the departmental dose-optimization program which includes automated exposure control, adjustment of the mA and/or kV according to patient size and/or use of iterative reconstruction technique. COMPARISON:  January 04, 2019. FINDINGS: Brain: Mild diffuse cortical atrophy is noted. Mild chronic ischemic white matter disease is noted. No mass effect or midline shift is noted. Ventricular size is within normal limits. There is no evidence of mass lesion, hemorrhage or acute infarction. Vascular: No hyperdense vessel or unexpected calcification. Skull: Normal. Negative for fracture or focal lesion. Sinuses/Orbits: No acute finding. Other: None. IMPRESSION: No acute intracranial abnormality seen. Electronically Signed   By: Marijo Conception M.D.   On: 05/27/2021  16:39   DG Chest Portable 1 View  Result Date: 05/27/2021 CLINICAL DATA:  Fall. EXAM: PORTABLE CHEST 1 VIEW COMPARISON:  February 26, 2021 FINDINGS: Elevation of the left hemidiaphragm is identified. The cardiomediastinal silhouette is normal. No pneumothorax. No nodules or masses. No focal infiltrates or overt edema. No acute abnormalities are identified. IMPRESSION: No active disease. Electronically Signed   By: Dorise Bullion III M.D.   On: 05/27/2021 16:09   DG Hip Unilat W or Wo Pelvis 2-3 Views Right  Result Date: 05/27/2021 CLINICAL DATA:  Fall. EXAM: DG HIP (WITH OR WITHOUT PELVIS) 2-3V RIGHT COMPARISON:  Right hip x-ray 07/12/2011. FINDINGS: There is an acute fracture of the right femoral neck. There is superolateral displacement of the  distal fracture fragment. There is no dislocation. The bones are osteopenic. Vertebroplasty changes are seen in the approximate L4 level. There is likely a healed right inferior pubic ramus fracture. IMPRESSION: 1. Displaced right femoral neck fracture. Electronically Signed   By: Ronney Asters M.D.   On: 05/27/2021 16:14    A/P: This is an 81 year old woman who unfortunately fell and has a right displaced femoral neck hip fracture.  Patient complains of significant pain and is unable to ambulate.  I have discussed the case with Dr.Swintech who will be available to perform definitive fracture treatment.  Patient will be admitted to the medical service.  Orthopedics will continue to follow.  Further recommendations pending Dr. Sid Falcon review.

## 2021-05-27 NOTE — H&P (Signed)
History and Physical    Julie Durham Julie Durham:[id number] DOB: 10/26/1940 [name]: 05/27/2021  PCP: Pcp, No  Patient coming from: SNF  I have personally briefly reviewed patient's old medical records in [facility]  Chief Complaint: hip pain, fall  HPI: Julie Durham is Julie Durham 81 y.o. female with medical history significant of osteoporosis with compression fractures, CAD, takutsubo's cardiomyopathy, hypertension and multiple other medical problems presenting after Verner Mccrone fall.  History is limited due to her dementia and pain.  History obtained from chart, discussion with son, I called [place], but no answer.  She had mechanical fall while using walker, striking hip on table and on ground. She reports head trauma, but no loss of consciousness.  Bed bugs reported by triage, but none seen by EDP and staff.  Hard to get further description of incident as she is in pain, c/o back pain while holding her hip.  One time she mentions chest pain, but then describes again back pain shortly after this without further explanation.  She denies smoking, drinking.  Son notes that she's had dementia for about 2 years, recently started hallucinating again.  Last weekend she feel out of her wheelchair, c/o R shoulder pain after that.    ED Course: imaging, pain control, admit for orthopedic eval  Review of Systems: As per HPI otherwise all other systems reviewed and are negative.  Past Medical History:  Diagnosis Date   Acute cystitis without hematuria    Acute encephalopathy 01/03/2019   Acute lower UTI 01/04/2019   Acute pain of right knee    Allergy    Anemia    Arthritis    Asthma    Brain mass    Julie Durham. 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   Cataract    Elevated troponin    Encephalitis, viral    Julie Durham. 08/2018: diagnosed with VZV  [facility]   Hyperlipidemia    Hypertension    Lumbar compression fracture ([name]) 10/22/2018   Mild CAD per cath 10/2018    Mild mitral regurgitation    Sadeen Wiegel. Echo 2011: LVEF >55% w/ grade 1 DD, mild MR, mild TR, elevated RVSP 30-5040mmHg   Mild tricuspid regurgitation    Oropharyngeal dysphagia 10/04/2016   Other bursal cyst, left elbow 10/04/2016   Palpitations    Pill esophagitis 10/04/2016   Stress fracture of lumbar vertebra 10/24/2018   Systolic dysfunction 10/24/2018   Nonischemic cardiomyopathy   Takotsubo cardiomyopathy    Takotsubo syndrome     Past Surgical History:  Procedure Laterality Date   APPENDECTOMY     CHOLECYSTECTOMY     DOPPLER ECHOCARDIOGRAPHY  2011   EYE SURGERY     KYPHOPLASTY Bilateral 10/22/2018   Procedure: KYPHOPLASTY LUMBAR ONE, LUMBAR FOUR;  Surgeon: Lisbeth Durham, Neelesh, MD;  Location: [facility] OR;  Service: Neurosurgery;  Laterality: Bilateral;  KYPHOPLASTY LUMBAR ONE, LUMBAR FOUR   LEFT HEART CATH AND CORONARY ANGIOGRAPHY N/Julie Durham 10/23/2018   Procedure: LEFT HEART CATH AND CORONARY ANGIOGRAPHY;  Surgeon: Tonny Durham, Michael, MD;  [facility];  Service: Cardiovascular;  Laterality: N/Julie Durham;   TUBAL LIGATION      Social History  reports that she has never smoked. She has never used smokeless tobacco. She reports that she does not drink alcohol and does not use drugs.  Allergies  Allergen Reactions  Doxycycline Hyclate Anaphylaxis and Other (See Comments)    Chest pain   Shellfish Allergy Anaphylaxis   Demerol Other (See Comments)    Sedation   Sulfa Antibiotics Other (See Comments)    Unknown rxn    Family History  Problem Relation Age of Onset   Alzheimer's disease Mother    Heart attack Father    Heart disease Brother    Diabetes Son    Sensorineural hearing loss Sister    Heart disease Brother    Heart disease Brother    Thyroid disease Brother    Prior to Admission medications   Medication Sig Start Date End Date  Taking? Authorizing Provider  acetaminophen (TYLENOL) 500 MG tablet Take 1 tablet (500 mg total) by mouth every 6 (six) hours as needed for mild pain or moderate pain. Patient taking differently: Take 500 mg by mouth in the morning, at noon, in the evening, and at bedtime. Or as needed for pain. Not to exceed 3 grams in 24 hours. 01/08/19  Yes Hongalgi, Lenis Dickinson, MD  aspirin EC 81 MG tablet Take 81 mg by mouth daily.    Yes [provider]  Calcium Carbonate-Vitamin D 600-400 MG-UNIT tablet Take 1 tablet by mouth 2 (two) times daily.   Yes [provider]  diclofenac Sodium (VOLTAREN) 1 % GEL Apply 4 g topically 2 (two) times daily.   Yes [provider]  feeding supplement, ENSURE ENLIVE, (ENSURE ENLIVE) LIQD Take 237 mLs by mouth 3 (three) times daily between meals. 01/08/19  Yes Hongalgi, Lenis Dickinson, MD  fluticasone (FLONASE) 50 MCG/ACT nasal spray Place 1 spray into both nostrils daily.   Yes [provider]  fluticasone-salmeterol (ADVAIR) 250-50 MCG/ACT AEPB Inhale 1 puff into the lungs in the morning and at bedtime.   Yes [provider]  gabapentin (NEURONTIN) 100 MG capsule Take 100 mg by mouth daily.   Yes [provider]  irbesartan (AVAPRO) 150 MG tablet Take 1 tablet (150 mg total) by mouth daily. 10/21/18  Yes Goodrich, Callie E, PA-C  lidocaine 4 % Place 1 patch onto the skin daily.   Yes [provider]  meloxicam (MOBIC) 7.5 MG tablet Take 7.5 mg by mouth daily.   Yes [provider]  Menthol, Topical Analgesic, (BIOFREEZE) 4 % GEL Apply 1 application. topically 3 (three) times daily.   Yes [provider]  metoprolol tartrate (LOPRESSOR) 25 MG tablet Take 25 mg by mouth 2 (two) times daily.   Yes [provider]  Multiple Vitamins-Minerals (OCUVITE EXTRA) TABS Take 1 tablet by mouth 2 (two) times daily.   Yes [provider]  nitroGLYCERIN (NITROSTAT) 0.4 MG SL tablet Place 0.4 mg under  the tongue every 5 (five) minutes as needed for chest pain.   Yes [provider]  omeprazole (PRILOSEC) 20 MG capsule Take 40 mg by mouth daily.   Yes [provider]  rosuvastatin (CRESTOR) 5 MG tablet Take 5 mg by mouth daily. 01/27/19  Yes [provider]    Physical Exam: Vitals:   05/27/21 1515 05/27/21 1530 05/27/21 1545 05/27/21 1615  BP: (!) 181/83 (!) 198/91 (!) 181/81 (!) 147/77  Pulse: 94 94 98 80  Resp: (!) 22 15 (!) 21 17  Temp:      TempSrc:      SpO2: 91% 92% 95% 95%  Weight:      Height:        Constitutional: NAD, calm, comfortable Vitals:   05/27/21 1515  05/27/21 1530 05/27/21 1545 05/27/21 1615  BP: (!) 181/83 (!) 198/91 (!) 181/81 (!) 147/77  Pulse: 94 94 98 80  Resp: (!) 22 15 (!) 21 17  Temp:      TempSrc:      SpO2: 91% 92% 95% 95%  Weight:      Height:       Eyes: PERRL, lids and conjunctivae normal ENMT: Mucous membranes are moist.  Neck: normal, supple Respiratory: clear to auscultation bilaterally, no wheezing, no crackles. Normal respiratory effort.  Cardiovascular: Regular rate and rhythm, no murmurs / rubs / gallops. No extremity edema.  Abdomen: no tenderness, no masses palpated.  Musculoskeletal: bilateral legs bent, up to chest -> holding her right hip Skin: no rashes, lesions, ulcers. No induration Neurologic: CN 2-12 grossly intact. Moving all extremities Psychiatric: Normal judgment and insight. Alert and oriented x 3. Normal mood.   Labs on Admission: I have personally reviewed following labs and imaging studies  CBC: Recent Labs  Lab 05/27/21 1431  WBC 5.8  NEUTROABS 4.5  HGB 12.6  HCT 37.6  MCV 94.2  PLT [id number]    Basic Metabolic Panel: Recent Labs  Lab 05/27/21 1431  NA 140  K 3.2*  CL 105  CO2 28  GLUCOSE 143*  BUN 16  CREATININE 0.51  CALCIUM 8.3*    GFR: Estimated Creatinine Clearance: 25.7 mL/min (by C-G formula based on SCr of 0.51 mg/dL).  Liver Function Tests: Recent Labs   Lab 05/27/21 1431  AST 19  ALT 17  ALKPHOS 98  BILITOT 0.5  PROT 6.3*  ALBUMIN 3.6    Urine analysis:    Component Value Date/Time   COLORURINE AMBER (Sherif Millspaugh) 01/03/2019 2211   APPEARANCEUR CLOUDY (Edwin Baines) 01/03/2019 2211   LABSPEC 1.021 01/03/2019 2211   PHURINE 5.0 01/03/2019 2211   GLUCOSEU NEGATIVE 01/03/2019 2211   HGBUR LARGE (Koven Belinsky) 01/03/2019 2211   [place] NEGATIVE 01/03/2019 2211   KETONESUR 20 (Mabry Santarelli) 01/03/2019 2211   PROTEINUR 30 (Uyen Eichholz) 01/03/2019 2211   NITRITE NEGATIVE 01/03/2019 2211   LEUKOCYTESUR MODERATE (Edwards Mckelvie) 01/03/2019 2211    Radiological Exams on Admission: DG Shoulder Right  Result Date: 05/27/2021 CLINICAL DATA:  Recent fall with right shoulder pain, initial encounter EXAM: RIGHT SHOULDER - 2+ VIEW COMPARISON:  None Available. FINDINGS: Humeral head is well seated. Clavicle is well visualized and within normal limits. There is an angulated fracture at the inferior aspect of the scapula incompletely evaluated on this exam. No other focal abnormality is noted. IMPRESSION: Apparent angulated fracture at the inferior aspect of the right scapula. No other focal abnormality is noted. Electronically Signed   By: Inez Catalina M.D.   On: 05/27/2021 18:47   CT Head Wo Contrast  Result Date: 05/27/2021 CLINICAL DATA:  Head trauma. EXAM: CT HEAD WITHOUT CONTRAST TECHNIQUE: Contiguous axial images were obtained from the base of the skull through the vertex without intravenous contrast. RADIATION DOSE REDUCTION: This exam was performed according to the departmental dose-optimization program which includes automated exposure control, adjustment of the mA and/or kV according to patient size and/or use of iterative reconstruction technique. COMPARISON:  January 04, 2019. FINDINGS: Brain: Mild diffuse cortical atrophy is noted. Mild chronic ischemic white matter disease is noted. No mass effect or midline shift is noted. Ventricular size is within normal limits. There is no evidence of mass  lesion, hemorrhage or acute infarction. Vascular: No hyperdense vessel or unexpected calcification. Skull: Normal. Negative for fracture or focal lesion. Sinuses/Orbits: No acute finding. Other:  None. IMPRESSION: No acute intracranial abnormality seen. Electronically Signed   By: Marijo Conception M.D.   On: 05/27/2021 16:39   DG Lumbar Spine 1 View  Result Date: 05/27/2021 CLINICAL DATA:  Recent fall with low back pain, initial encounter EXAM: LUMBAR SPINE - 1 VIEW COMPARISON:  10/22/2018, 10/05/2018 FINDINGS: Limited single lateral cross-table view of the lumbar spine was obtained. There are changes consistent with prior vertebral augmentation at L4 and L1. Chronic compression fracture is noted at L2 similar to that seen on prior MRI. Apparent L1 and L5 compression deformities are noted new from the prior MRI but possibly chronic in nature. IMPRESSION: Changes of prior vertebral augmentation at L1 and L4. Multilevel compression deformities are noted within the lumbar spine. The exam is limited by severe osteoporosis. Electronically Signed   By: Inez Catalina M.D.   On: 05/27/2021 18:45   DG Thoracic Spine 1 View  Result Date: 05/27/2021 CLINICAL DATA:  Fall. EXAM: OPERATIVE THORACIC SPINE cross-table lateral VIEW(S) COMPARISON:  CT of the chest 12/26/2018. FINDINGS: Examination is significantly limited secondary to patient positioning and diffuse osteopenia. Multilevel thoracic compression deformities are likely present, but not well evaluated on this study. These were seen on prior CT. Vertebroplasty changes at L1 are again noted. IMPRESSION: 1. Single lateral view of the thoracic spine is significantly limited secondary to osteopenia. Multilevel compression deformities are likely present as seen on prior CT, but not well evaluated. This could be better evaluated with dedicated thoracic CT if clinically warranted. Electronically Signed   By: Ronney Asters M.D.   On: 05/27/2021 18:44   DG Chest Portable 1  View  Result Date: 05/27/2021 CLINICAL DATA:  Fall. EXAM: PORTABLE CHEST 1 VIEW COMPARISON:  February 26, 2021 FINDINGS: Elevation of the left hemidiaphragm is identified. The cardiomediastinal silhouette is normal. No pneumothorax. No nodules or masses. No focal infiltrates or overt edema. No acute abnormalities are identified. IMPRESSION: No active disease. Electronically Signed   By: Dorise Bullion III M.D.   On: 05/27/2021 16:09   DG Hip Unilat W or Wo Pelvis 2-3 Views Right  Result Date: 05/27/2021 CLINICAL DATA:  Fall. EXAM: DG HIP (WITH OR WITHOUT PELVIS) 2-3V RIGHT COMPARISON:  Right hip x-ray 07/12/2011. FINDINGS: There is an acute fracture of the right femoral neck. There is superolateral displacement of the distal fracture fragment. There is no dislocation. The bones are osteopenic. Vertebroplasty changes are seen in the approximate L4 level. There is likely Kami Kube healed right inferior pubic ramus fracture. IMPRESSION: 1. Displaced right femoral neck fracture. Electronically Signed   By: Ronney Asters M.D.   On: 05/27/2021 16:14    EKG: Independently reviewed. pending  Assessment/Plan Principal Problem:   Hip fracture ([place]) Active Problems:   Fall against object   Closed right scapular fracture   Chest pain   Dementia ([name])   Frailty   Essential hypertension   History of cardiomyopathy   History of CAD (coronary artery disease)   Infestation by bed bug   DNR (do not resuscitate)   Osteoporosis    Assessment and Plan: * Hip fracture ([place]) Plain films with displaced R femoral neck fracture Ortho c/s, appreciate assistance Pain management, scheduled apap, oxy, dilaudid Post op DVT ppx per ortho, SCD's for now Was walking with walker prior to her fall, has been intermittently wheelchair bound at times in the past as well RCRI at least 2 with hx CAD and cardiomyopathy (though most recent echo with improved EF), class 2, 10%  30 day risk death, MI, cardiac arrest.  She's frail with  BMI of 13. She was confused on my exam and c/o back pain at one time mentioned CP, but difficult to clarify exactly with her pain/confusion.  CXR without active disease. Will check EKG/troponins (pending), if these look ok, I don't think any additional testing warranted prior to surgery.  Will discuss further with ortho.       Closed right scapular fracture [name] out of wheelchair about 1 week prior to admission with shoulder pain Plain film with angulated fx at inferior aspect of R scapula  Fall against object Fell hitting hip on table and on ground She's complaining about back pain in the room, but holding her R hip (suspect this is due to R hip fx).   Head CT without acute abnormalities L spine with multilevel compression deformities noted within lumbar spine, prior vertebral augmentation.  Limited exam by severe osteoporosis.  T spine limited due to osteopenia, consider thoracic CT At this point, suspect pain related to hip fx, would have low threshold for additional imaging  Frailty Body mass index is 13.13 kg/m.  Dietitian c/s  Dementia [facility]) Delirium precautions  Chest pain Difficult interview with pain, she was mostly c/o back pain as noted above, but grabbing R hip Difficult with her dementia and uncontrolled pain Follow EKG and troponin   History of cardiomyopathy Echo 05/2019 with EF [id number], grade 1 diastolic dysfunction Not overloaded on exam todaay  Essential hypertension Metoprolol Holding arb for now with plan for surgery  History of CAD (coronary artery disease) LHC 10/2018 with mild nonobstructive CAD, mderate segmental LV systolic dysfunction in periapical pattern likely secondary to takotsubo syndrome Continue metoprolol Resume aspirin post op Continue crestor  Infestation by bed bug Bed bugs reported on arrival, none noted by EDP Will continue contact precautions for now and monitor closely  Osteoporosis Hx compression fx with prior vertebral  augmentation Needs outpatient treatment  DNR (do not resuscitate) Noted on most form (not completely filled out - see media) and confirmed with son     DVT prophylaxis: SCD  Code Status:   DNR  Family Communication:  Son over phone  Disposition Plan:   Patient is from:  SNF  Anticipated DC to:  SNF  Anticipated DC date:  Pending ortho eval, possible procedure  Anticipated DC barriers: Pending ortho eval, possible procedure  Consults called:  orthopedics  Admission status:  inpatient   Severity of Illness: The appropriate patient status for this patient is INPATIENT. Inpatient status is judged to be reasonable and necessary in order to provide the required intensity of service to ensure the patient's safety. The patient's presenting symptoms, physical exam findings, and initial radiographic and laboratory data in the context of their chronic comorbidities is felt to place them at high risk for further clinical deterioration. Furthermore, it is not anticipated that the patient will be medically stable for discharge from the hospital within 2 midnights of admission.   * I certify that at the point of admission it is my clinical judgment that the patient will require inpatient hospital care spanning beyond 2 midnights from the point of admission due to high intensity of service, high risk for further deterioration and high frequency of surveillance required.Fayrene Helper MD [facility]  How to contact the [facility] Attending or Consulting provider Faith or covering provider during after hours [place], for this patient?   Check the care team in [facility]  and look for Amory Simonetti) attending/consulting [place] provider listed and b) the [facility] team listed Log into [email or web address] and use [place]'s universal password to access. If you do not have the password, please contact the hospital operator. Locate the [facility] provider you are looking for under [facility] and page to Christal Lagerstrom number that you can be  directly reached. If you still have difficulty reaching the provider, please page the [facility] (Director on Call) for the Hospitalists listed on amion for assistance.  05/27/2021, 7:55 PM

## 2021-05-27 NOTE — Assessment & Plan Note (Addendum)
Improved.  Difficult to get accurate measurement in the setting of agitation/delirium.

## 2021-05-27 NOTE — ED Triage Notes (Signed)
[facility] reports pt coming from [place] for a fall. Pt c/o right hip pain going to right knee. No LOC. Staff reported bedbugs, EMS reports they saw a few in the bed.

## 2021-05-27 NOTE — Assessment & Plan Note (Addendum)
She has multiple vertebral compression fractures of different ages.  Not acute.  She is very malnourished. Doubt utility of treatment given grim prognosis

## 2021-05-28 ENCOUNTER — Inpatient Hospital Stay (HOSPITAL_COMMUNITY): Payer: Medicare (Managed Care)

## 2021-05-28 DIAGNOSIS — S72001A Fracture of unspecified part of neck of right femur, initial encounter for closed fracture: Secondary | ICD-10-CM | POA: Diagnosis not present

## 2021-05-28 LAB — HEMOGLOBIN A1C
Hgb A1c MFr Bld: 5.3 % (ref 4.8–5.6)
Mean Plasma Glucose: 105.41 mg/dL

## 2021-05-28 LAB — CBC
HCT: 30.3 % — ABNORMAL LOW (ref 36.0–46.0)
Hemoglobin: 10.1 g/dL — ABNORMAL LOW (ref 12.0–15.0)
MCH: 31.8 pg (ref 26.0–34.0)
MCHC: 33.3 g/dL (ref 30.0–36.0)
MCV: 95.3 fL (ref 80.0–100.0)
Platelets: 203 10*3/uL (ref 150–400)
RBC: 3.18 MIL/uL — ABNORMAL LOW (ref 3.87–5.11)
RDW: 11.8 % (ref 11.5–15.5)
WBC: 9.1 10*3/uL (ref 4.0–10.5)
nRBC: 0 % (ref 0.0–0.2)

## 2021-05-28 LAB — COMPREHENSIVE METABOLIC PANEL
ALT: 35 U/L (ref 0–44)
AST: 57 U/L — ABNORMAL HIGH (ref 15–41)
Albumin: 3 g/dL — ABNORMAL LOW (ref 3.5–5.0)
Alkaline Phosphatase: 90 U/L (ref 38–126)
Anion gap: 5 (ref 5–15)
BUN: 19 mg/dL (ref 8–23)
CO2: 30 mmol/L (ref 22–32)
Calcium: 8.4 mg/dL — ABNORMAL LOW (ref 8.9–10.3)
Chloride: 105 mmol/L (ref 98–111)
Creatinine, Ser: 0.51 mg/dL (ref 0.44–1.00)
GFR, Estimated: >60 mL/min (ref >60)
Glucose, Bld: 127 mg/dL — ABNORMAL HIGH (ref 70–99)
Potassium: 3.5 mmol/L (ref 3.5–5.1)
Sodium: 140 mmol/L (ref 135–145)
Total Bilirubin: 0.7 mg/dL (ref 0.3–1.2)
Total Protein: 5.5 g/dL — ABNORMAL LOW (ref 6.5–8.1)

## 2021-05-28 LAB — MRSA NEXT GEN BY PCR, NASAL: MRSA by PCR Next Gen: NOT DETECTED

## 2021-05-28 MED ORDER — ENSURE ENLIVE PO LIQD
237.0000 mL | Freq: Three times a day (TID) | ORAL | Status: DC
  Administered 2021-05-29 – 2021-06-02 (×5): 237 mL via ORAL

## 2021-05-28 MED ORDER — LORAZEPAM 2 MG/ML IJ SOLN
0.5000 mg | Freq: Once | INTRAMUSCULAR | Status: AC
  Administered 2021-05-28: 0.5 mg via INTRAVENOUS
  Filled 2021-05-28: qty 1

## 2021-05-28 MED ORDER — IRBESARTAN 150 MG PO TABS
150.0000 mg | ORAL_TABLET | Freq: Every day | ORAL | Status: DC
  Administered 2021-05-28 – 2021-06-03 (×5): 150 mg via ORAL
  Filled 2021-05-28 (×6): qty 1

## 2021-05-28 MED ORDER — ADULT MULTIVITAMIN W/MINERALS CH
1.0000 | ORAL_TABLET | Freq: Every day | ORAL | Status: DC
  Administered 2021-05-29 – 2021-06-03 (×3): 1 via ORAL
  Filled 2021-05-28 (×5): qty 1

## 2021-05-28 MED ORDER — LORAZEPAM 2 MG/ML IJ SOLN: 0.5000 mg | Freq: Once | INTRAMUSCULAR | Status: DC | PRN

## 2021-05-28 MED ORDER — LIDOCAINE 5 % EX PTCH
1.0000 | MEDICATED_PATCH | CUTANEOUS | Status: DC
  Administered 2021-05-28 – 2021-06-05 (×8): 1 via TRANSDERMAL
  Filled 2021-05-28 (×10): qty 1

## 2021-05-28 MED ORDER — DEXTROSE IN LACTATED RINGERS 5 % IV SOLN: INTRAVENOUS | Status: DC

## 2021-05-28 NOTE — Hospital Course (Addendum)
81 year old female with history of dementia, CAD, Takotsubo cardiomyopathy, HTN, recurrent falls, compression fractures and severe malnutrition admitted for right hip and right scapular fracture after fall at nursing home.  She underwent IM fixation for right hip fracture on 5/21.  Postop course complicated by delirium.  Palliative medicine consulted.  Patient remains delirious.  Refusing to take p.o. medications or food.  Palliative medicine following

## 2021-05-28 NOTE — Progress Notes (Signed)
Initial Nutrition Assessment  DOCUMENTATION CODES:   Underweight  INTERVENTION:   -Needs updated weight for admission  -Ensure Plus High Protein po TID, each supplement provides 350 kcal and 20 grams of protein.   -Multivitamin with minerals daily   NUTRITION DIAGNOSIS:   Increased nutrient needs related to chronic illness, hip fracture as evidenced by estimated needs.  GOAL:   Patient will meet greater than or equal to 90% of their needs  MONITOR:   PO intake, Supplement acceptance, Labs, Weight trends, I & O's  REASON FOR ASSESSMENT:   Malnutrition Screening Tool    ASSESSMENT:   81 y.o. female with medical history significant of osteoporosis with compression fractures, CAD, takutsubo's cardiomyopathy, hypertension and multiple other medical problems presenting after a fall. Admitted for right hip fracture.  Patient currently alert/oriented x 1. H/o dementia. In a lot of pain r/t right hip fracture. Currently NPO. Surgery planned for 5/21.   Per review of home medications, pt takes calcium with Vitamin D and daily MVI at facility.  Per weight records, pt needs updated weight. Same exact weight from 02/26/21 documented for this admission. Suspect malnutrition but unable to diagnose at this time.  Medications reviewed.  Labs reviewed.  NUTRITION - FOCUSED PHYSICAL EXAM:  Unable to complete, RD remote  Diet Order:   Diet Order             Diet NPO time specified Except for: Sips with Meds  Diet effective midnight                   EDUCATION NEEDS:   Not appropriate for education at this time  Skin:  Skin Assessment: Reviewed RN Assessment  Last BM:  PTA  Height:   Ht Readings from Last 1 Encounters:  05/27/21 4\' 11"  (1.499 m)    Weight:   Wt Readings from Last 1 Encounters:  05/27/21 29.5 kg    BMI:  Body mass index is 13.13 kg/m.  Estimated Nutritional Needs:   Kcal:  1450-1650  Protein:  65-80g  Fluid:  1.6L/day  Clayton Bibles, MS, RD, LDN Inpatient Clinical Dietitian Contact information available via Amion

## 2021-05-28 NOTE — Progress Notes (Signed)
Gave report on patient to [facility], RN, who will be resuming the patient's care and monitoring the patient. Patient was asleep in bed and stable during handoff.

## 2021-05-28 NOTE — Progress Notes (Signed)
PROGRESS NOTE    Julie Durham  ONG:[id number] DOB: 1940-08-11 [name]: 05/27/2021 PCP: Pcp, No  Chief Complaint  Patient presents with   Fall    Brief Narrative:  Julie Durham is Julie Durham 81 y.o. female with medical history significant of osteoporosis with compression fractures, CAD, takutsubo's cardiomyopathy, hypertension and multiple other medical problems presenting after Kue Fox fall.    Assessment & Plan:   Principal Problem:   Hip fracture [facility]) Active Problems:   Fall against object   Closed right scapular fracture   Chest pain   Dementia ([name])   Frailty   Essential hypertension   History of cardiomyopathy   History of CAD (coronary artery disease)   Infestation by bed bug   DNR (do not resuscitate)   Osteoporosis   Protein-calorie malnutrition, severe   Assessment and Plan: * Hip fracture ([name]) Plain films with displaced R femoral neck fracture CT with comminuted intertrochanteric fx of the proximal R femor with varus angulation, moderate compression of the L5 vertebral body which appears acute or subacute, partially visualized L3 compression fx, age indeterminate, remote healed right superior and inferior pubic rami fx Ortho c/s, appreciate assistance Pain management, scheduled apap, oxy, dilaudid Post op DVT ppx per ortho, SCD's for now Was walking with walker prior to her fall, has been intermittently wheelchair bound at times in the past as well RCRI at least 2 with hx CAD and cardiomyopathy (though most recent echo with improved EF), class 2, 10% 30 day risk death, MI, cardiac arrest.  She's frail with BMI of 13. She was confused on my exam and c/o back pain at one time mentioned CP, but difficult to clarify exactly with her pain/confusion.  CXR without active disease. Will check EKG (NSR, negative troponins x2), I don't think any additional testing warranted prior to surgery.  Will discuss further with ortho.       Closed right scapular fracture Larey Seat out of  wheelchair about 1 week prior to admission with shoulder pain Plain film with angulated fx at inferior aspect of R scapula Per orthopedics  Fall against object Fell hitting hip on table and on ground She's complaining about back pain in the room, but holding her R hip (suspect this is due to R hip fx).   Head CT without acute abnormalities L spine with multilevel compression deformities noted within lumbar spine, prior vertebral augmentation.  Limited exam by severe osteoporosis.  T spine limited due to osteopenia, consider thoracic CT Will get CT L and T spine given CT findings from R hip films  Frailty Body mass index is 13.13 kg/m.  Dietitian c/s  Dementia [facility]) Delirium precautions  Chest pain Limited hx with dementia Seems musculoskeletal on exam today Difficult with her dementia and uncontrolled pain Follow EKG and troponin (negative troponin x2)   History of cardiomyopathy Echo 05/2019 with EF 60-65%, grade 1 diastolic dysfunction Not overloaded on exam todaay  Essential hypertension Metoprolol Significantly elevated -> treat pain Will dose arb today, hold on day of surgery   History of CAD (coronary artery disease) LHC 10/2018 with mild nonobstructive CAD, mderate segmental LV systolic dysfunction in periapical pattern likely secondary to takotsubo syndrome Continue metoprolol Resume aspirin post op Continue crestor  Infestation by bed bug Bed bugs reported on arrival, none noted by EDP Will continue contact precautions for now and monitor closely  Osteoporosis Hx compression fx with prior vertebral augmentation Needs outpatient treatment  DNR (do not resuscitate) Noted on most form (not completely  filled out - see media) and confirmed with son  Protein-calorie malnutrition, severe RD      DVT prophylaxis: SCD Code Status: DNR Family Communication: son Disposition:   Status is: Inpatient Remains inpatient appropriate because: need for ortho  intervention   Consultants:  orthopedics  Procedures:  none  Antimicrobials:  Anti-infectives (From admission, onward)    None       Subjective: C/o R hip pain confused  Objective: Vitals:   05/28/21 0611 05/28/21 0922 05/28/21 1118 05/28/21 1642  BP: (!) 150/71 (!) 167/83 (!) 183/85 (!) 179/80  Pulse: 74  82   Resp: 19 16 20    Temp: 98.6 F (37 C) 97.6 F (36.4 C) 98.1 F (36.7 C)   TempSrc: Oral Oral Oral   SpO2: 99% 99% 97%   Weight:      Height:        Intake/Output Summary (Last 24 hours) at 05/28/2021 1733 Last data filed at 05/28/2021 1124 Gross per 24 hour  Intake 300 ml  Output 0 ml  Net 300 ml   Filed Weights   05/27/21 1426  Weight: 29.5 kg    Examination:  General exam: Appears calm and comfortable  Respiratory system: unlabored Cardiovascular system: RRR Gastrointestinal system: Abdomen is nondistended, soft and nontende Central nervous system: Alert but confused. No focal neurological deficits. Extremities: pain to R hip    Data Reviewed: I have personally reviewed following labs and imaging studies  CBC: Recent Labs  Lab 05/27/21 1431 05/28/21 0405  WBC 5.8 9.1  NEUTROABS 4.5  --   HGB 12.6 10.1*  HCT 37.6 30.3*  MCV 94.2 95.3  PLT 269 203    Basic Metabolic Panel: Recent Labs  Lab 05/27/21 1431 05/28/21 0405  NA 140 140  K 3.2* 3.5  CL 105 105  CO2 28 30  GLUCOSE 143* 127*  BUN 16 19  CREATININE 0.51 0.51  CALCIUM 8.3* 8.4*    GFR: Estimated Creatinine Clearance: 25.7 mL/min (by C-G formula based on SCr of 0.51 mg/dL).  Liver Function Tests: Recent Labs  Lab 05/27/21 1431 05/28/21 0405  AST 19 57*  ALT 17 35  ALKPHOS 98 90  BILITOT 0.5 0.7  PROT 6.3* 5.5*  ALBUMIN 3.6 3.0*    CBG: No results for input(s): GLUCAP in the last 168 hours.   Recent Results (from the past 240 hour(s))  MRSA Next Gen by PCR, Nasal     Status: None   Collection Time: 05/27/21 11:57 PM   Specimen: Nasal Mucosa; Nasal  Swab  Result Value Ref Range Status   MRSA by PCR Next Gen NOT DETECTED NOT DETECTED Final    Comment: (NOTE) The GeneXpert MRSA Assay (FDA approved for NASAL specimens only), is one component of Meika Earll comprehensive MRSA colonization surveillance program. It is not intended to diagnose MRSA infection nor to guide or monitor treatment for MRSA infections. Test performance is not FDA approved in patients less than 44 years old. Performed at [facility], [address], [place], [place]          Radiology Studies: DG Shoulder Right  Result Date: 05/27/2021 CLINICAL DATA:  Recent fall with right shoulder pain, initial encounter EXAM: RIGHT SHOULDER - 2+ VIEW COMPARISON:  None Available. FINDINGS: Humeral head is well seated. Clavicle is well visualized and within normal limits. There is an angulated fracture at the inferior aspect of the scapula incompletely evaluated on this exam. No other focal abnormality is noted. IMPRESSION: Apparent  angulated fracture at the inferior aspect of the right scapula. No other focal abnormality is noted. Electronically Signed   By: Alcide CleverMark  Lukens M.D.   On: 05/27/2021 18:47   CT Head Wo Contrast  Result Date: 05/27/2021 CLINICAL DATA:  Head trauma. EXAM: CT HEAD WITHOUT CONTRAST TECHNIQUE: Contiguous axial images were obtained from the base of the skull through the vertex without intravenous contrast. RADIATION DOSE REDUCTION: This exam was performed according to the departmental dose-optimization program which includes automated exposure control, adjustment of the mA and/or kV according to patient size and/or use of iterative reconstruction technique. COMPARISON:  January 04, 2019. FINDINGS: Brain: Mild diffuse cortical atrophy is noted. Mild chronic ischemic white matter disease is noted. No mass effect or midline shift is noted. Ventricular size is within normal limits. There is no evidence of mass lesion, hemorrhage or acute  infarction. Vascular: No hyperdense vessel or unexpected calcification. Skull: Normal. Negative for fracture or focal lesion. Sinuses/Orbits: No acute finding. Other: None. IMPRESSION: No acute intracranial abnormality seen. Electronically Signed   By: Lupita RaiderJames  Green Jr M.D.   On: 05/27/2021 16:39   DG Lumbar Spine 1 View  Result Date: 05/27/2021 CLINICAL DATA:  Recent fall with low back pain, initial encounter EXAM: LUMBAR SPINE - 1 VIEW COMPARISON:  10/22/2018, 10/05/2018 FINDINGS: Limited single lateral cross-table view of the lumbar spine was obtained. There are changes consistent with prior vertebral augmentation at L4 and L1. Chronic compression fracture is noted at L2 similar to that seen on prior MRI. Apparent L1 and L5 compression deformities are noted new from the prior MRI but possibly chronic in nature. IMPRESSION: Changes of prior vertebral augmentation at L1 and L4. Multilevel compression deformities are noted within the lumbar spine. The exam is limited by severe osteoporosis. Electronically Signed   By: Alcide CleverMark  Lukens M.D.   On: 05/27/2021 18:45   DG Thoracic Spine 1 View  Result Date: 05/27/2021 CLINICAL DATA:  Fall. EXAM: OPERATIVE THORACIC SPINE cross-table lateral VIEW(S) COMPARISON:  CT of the chest 12/26/2018. FINDINGS: Examination is significantly limited secondary to patient positioning and diffuse osteopenia. Multilevel thoracic compression deformities are likely present, but not well evaluated on this study. These were seen on prior CT. Vertebroplasty changes at L1 are again noted. IMPRESSION: 1. Single lateral view of the thoracic spine is significantly limited secondary to osteopenia. Multilevel compression deformities are likely present as seen on prior CT, but not well evaluated. This could be better evaluated with dedicated thoracic CT if clinically warranted. Electronically Signed   By: Darliss CheneyAmy  Guttmann M.D.   On: 05/27/2021 18:44   CT HIP RIGHT WO CONTRAST  Result Date:  05/28/2021 CLINICAL DATA:  Right hip fracture EXAM: CT OF THE RIGHT HIP WITHOUT CONTRAST TECHNIQUE: Multidetector CT imaging of the right hip was performed according to the standard protocol. Multiplanar CT image reconstructions were also generated. RADIATION DOSE REDUCTION: This exam was performed according to the departmental dose-optimization program which includes automated exposure control, adjustment of the mA and/or kV according to patient size and/or use of iterative reconstruction technique. COMPARISON:  X-ray 05/27/2021 FINDINGS: Bones/Joint/Cartilage Comminuted intertrochanteric fracture of the proximal right femur with varus angulation. No fracture involvement of the femoral head. Hip joint alignment is maintained without dislocation. Remote healed right superior and inferior pubic rami fractures. No pelvic diastasis. Moderate compression fracture of the L5 vertebral body which appears acute or subacute. Partially visualized L3 compression fracture, age indeterminate. Prior cement augmentation of Bo Teicher L4 vertebral body fracture. Bones are diffusely  demineralized. Ligaments Suboptimally assessed by CT. Muscles and Tendons No acute musculotendinous abnormality by CT. Soft tissues Negative for hematoma. Diverticular changes of the included sigmoid colon. IMPRESSION: 1. Comminuted intertrochanteric fracture of the proximal right femur with varus angulation. 2. Moderate compression fracture of the L5 vertebral body which appears acute or subacute. 3. Partially visualized L3 compression fracture, age indeterminate. 4. Remote healed right superior and inferior pubic rami fractures. Electronically Signed   By: Duanne Guess D.O.   On: 05/28/2021 14:16   DG Chest Portable 1 View  Result Date: 05/27/2021 CLINICAL DATA:  Fall. EXAM: PORTABLE CHEST 1 VIEW COMPARISON:  February 26, 2021 FINDINGS: Elevation of the left hemidiaphragm is identified. The cardiomediastinal silhouette is normal. No pneumothorax. No  nodules or masses. No focal infiltrates or overt edema. No acute abnormalities are identified. IMPRESSION: No active disease. Electronically Signed   By: Gerome Sam III M.D.   On: 05/27/2021 16:09   DG Knee Right Port  Result Date: 05/28/2021 CLINICAL DATA:  Fracture neck of right femur, pain EXAM: PORTABLE RIGHT KNEE - 1-2 VIEW COMPARISON:  05/27/2021 FINDINGS: Osteopenia is seen in bony structures. No displaced fracture or dislocation is seen in the right knee. AP view is less than optimal due to patient's clinical condition. Right knee is kept in flexed position. There is no definite effusion in the suprapatellar bursa. IMPRESSION: No fracture or dislocation is seen in the limited examination of right knee. Electronically Signed   By: Ernie Avena M.D.   On: 05/28/2021 09:45   DG Hip Unilat W or Wo Pelvis 2-3 Views Right  Result Date: 05/27/2021 CLINICAL DATA:  Fall. EXAM: DG HIP (WITH OR WITHOUT PELVIS) 2-3V RIGHT COMPARISON:  Right hip x-ray 07/12/2011. FINDINGS: There is an acute fracture of the right femoral neck. There is superolateral displacement of the distal fracture fragment. There is no dislocation. The bones are osteopenic. Vertebroplasty changes are seen in the approximate L4 level. There is likely Margan Elias healed right inferior pubic ramus fracture. IMPRESSION: 1. Displaced right femoral neck fracture. Electronically Signed   By: Darliss Cheney M.D.   On: 05/27/2021 16:14        Scheduled Meds:  acetaminophen  1,000 mg Oral Q8H   [START ON 05/29/2021] feeding supplement  237 mL Oral TID BM   gabapentin  100 mg Oral QHS   irbesartan  150 mg Oral Daily   lidocaine  1 patch Transdermal Q24H   metoprolol tartrate  25 mg Oral BID   [START ON 05/29/2021] multivitamin with minerals  1 tablet Oral Daily   pantoprazole  40 mg Oral Daily   rosuvastatin  5 mg Oral Daily   Continuous Infusions:  dextrose 5% lactated ringers 50 mL/hr at 05/28/21 1514     LOS: 1 day    Time  spent: over 30 min    Lacretia Nicks, MD [facility]   To contact the attending provider between 7A-7P or the covering provider during after hours 7P-7A, please log into the web site [email or web address] and access using universal [place] password for that web site. If you do not have the password, please call the hospital operator.  05/28/2021, 5:33 PM

## 2021-05-28 NOTE — Progress Notes (Signed)
Bedbug found and confirmed by environmental services. Policy followed in completing CT scan. AC and charge RN made aware of findings.

## 2021-05-28 NOTE — Assessment & Plan Note (Deleted)
RD

## 2021-05-28 NOTE — Progress Notes (Addendum)
Subjective:   Procedure(s) (LRB): ANTERIOR APPROACH HEMI HIP ARTHROPLASTY (Right)  RN in room with me during exam this AM. Patient with baseline dementia which limits the exam. She is moaning about pain and wanting to be left alone.   I called her son, Onalee Hua, who tells me that she normally walks with a walker but has not been able to since several recent falls. We reviewed her diagnosis and plans for CT scan today to determine surgical intervention.   Objective: Vital signs in last 24 hours: Temp:  [97.6 F (36.4 C)-98.6 F (37 C)] 97.6 F (36.4 C) (05/20 0922) Pulse Rate:  [64-103] 74 (05/20 0611) Resp:  [15-30] 16 (05/20 0922) BP: (104-198)/(57-114) 167/83 (05/20 0922) SpO2:  [91 %-100 %] 99 % (05/20 0922) Weight:  [29.5 kg] 29.5 kg (05/19 1426)  Intake/Output from previous day:  Intake/Output Summary (Last 24 hours) at 05/28/2021 1117 Last data filed at 05/28/2021 5956 Gross per 24 hour  Intake 240 ml  Output 0 ml  Net 240 ml     Intake/Output this shift: No intake/output data recorded.  Labs: Recent Labs    05/27/21 1431 05/28/21 0405  HGB 12.6 10.1*   Recent Labs    05/27/21 1431 05/28/21 0405  WBC 5.8 9.1  RBC 3.99 3.18*  HCT 37.6 30.3*  PLT 269 203   Recent Labs    05/27/21 1431 05/28/21 0405  NA 140 140  K 3.2* 3.5  CL 105 105  CO2 28 30  BUN 16 19  CREATININE 0.51 0.51  GLUCOSE 143* 127*  CALCIUM 8.3* 8.4*   No results for input(s): LABPT, INR in the last 72 hours.  Exam: General - Patient is Alert Extremity - Physical exam not able to be performed due to patient's pain and agitation from recent x-rays.  She sits in bed with her knees to her chest.   Motor Function - She is wiggling around in bed.   Past Medical History:  Diagnosis Date   Acute cystitis without hematuria    Acute encephalopathy 01/03/2019   Acute lower UTI 01/04/2019   Acute pain of right knee    Allergy    Anemia    Arthritis    Asthma    Brain mass    a.  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   Cataract    Elevated troponin    Encephalitis, viral    a. 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 Echo 2011: LVEF >55% w/ grade 1 DD, mild MR, mild TR, elevated RVSP 30-2mmHg   Mild tricuspid regurgitation    Oropharyngeal dysphagia 10/04/2016   Other bursal cyst, left elbow 10/04/2016   Palpitations    Pill esophagitis 10/04/2016   Stress fracture of lumbar vertebra 10/24/2018   Systolic dysfunction 10/24/2018   Nonischemic cardiomyopathy   Takotsubo cardiomyopathy    Takotsubo syndrome     Assessment/Plan:   Procedure(s) (LRB): ANTERIOR APPROACH HEMI HIP ARTHROPLASTY (Right) Principal Problem:   Hip fracture [facility]) Active Problems:   Essential hypertension   Chest pain   Fall against object   Dementia [facility])   History of cardiomyopathy   History of CAD (  coronary artery disease)   Infestation by bed bug   DNR (do not resuscitate)   Frailty   Closed right scapular fracture   Osteoporosis  Estimated body mass index is 13.13 kg/m as calculated from the following:   Height as of this encounter: 4\' 11"  (1.499 m).   Weight as of this encounter: 29.5 kg.   Assessment: Right femoral neck fracture Hx of dementia  Closed right scapula fracture    Plan:   Per RN and chart review, concerns at facility about bed bugs. RN working to determine if she can be cleared from this in order to undergo CT scan. Likely need for surgical intervention with hemiarthroplasty, but CT required for definitive surgical planning.  No surgery plans until this is obtained. If able to get imaging today, will place orders for surgery  tomorrow.   Dr. will review CT when obtained. Will see patient tomorrow to determine further plan.   I called her son, Linna Caprice [phone] and discussed this plan and proposed treatment. He will be coming by today to see her.   Addendum: closed right scapula fracture. Non-op management. Sling when up. WBAT. Follow up outpatient with Dr. [phone] vs upper extremity physician.   Linna Caprice, PA-C Orthopedic Surgery [phone] 05/28/2021, 11:17 AM

## 2021-05-29 ENCOUNTER — Inpatient Hospital Stay (HOSPITAL_COMMUNITY): Payer: Medicare (Managed Care)

## 2021-05-29 ENCOUNTER — Inpatient Hospital Stay (HOSPITAL_COMMUNITY): Payer: Medicare (Managed Care) | Admitting: Anesthesiology

## 2021-05-29 ENCOUNTER — Encounter (HOSPITAL_COMMUNITY)
Admission: EM | Disposition: A | Discharge status: Hospice | Payer: Self-pay | Source: Home / Self Care | Attending: Student

## 2021-05-29 DIAGNOSIS — S72101A Unspecified trochanteric fracture of right femur, initial encounter for closed fracture: Secondary | ICD-10-CM

## 2021-05-29 DIAGNOSIS — N3289 Other specified disorders of bladder: Secondary | ICD-10-CM

## 2021-05-29 DIAGNOSIS — I2510 Atherosclerotic heart disease of native coronary artery without angina pectoris: Secondary | ICD-10-CM

## 2021-05-29 DIAGNOSIS — S72001A Fracture of unspecified part of neck of right femur, initial encounter for closed fracture: Secondary | ICD-10-CM | POA: Diagnosis not present

## 2021-05-29 DIAGNOSIS — I509 Heart failure, unspecified: Secondary | ICD-10-CM

## 2021-05-29 DIAGNOSIS — I110 Hypertensive heart disease with heart failure: Secondary | ICD-10-CM

## 2021-05-29 HISTORY — PX: INTRAMEDULLARY (IM) NAIL INTERTROCHANTERIC: SHX5875

## 2021-05-29 LAB — CBC WITH DIFFERENTIAL/PLATELET
Abs Immature Granulocytes: 0.08 10*3/uL — ABNORMAL HIGH (ref 0.00–0.07)
Basophils Absolute: 0 10*3/uL (ref 0.0–0.1)
Basophils Relative: 0 %
Eosinophils Absolute: 0 10*3/uL (ref 0.0–0.5)
Eosinophils Relative: 0 %
HCT: 31.4 % — ABNORMAL LOW (ref 36.0–46.0)
Hemoglobin: 10 g/dL — ABNORMAL LOW (ref 12.0–15.0)
Immature Granulocytes: 1 %
Lymphocytes Relative: 7 %
Lymphs Abs: 0.8 10*3/uL (ref 0.7–4.0)
MCH: 30.4 pg (ref 26.0–34.0)
MCHC: 31.8 g/dL (ref 30.0–36.0)
MCV: 95.4 fL (ref 80.0–100.0)
Monocytes Absolute: 0.7 10*3/uL (ref 0.1–1.0)
Monocytes Relative: 6 %
Neutro Abs: 10.1 10*3/uL — ABNORMAL HIGH (ref 1.7–7.7)
Neutrophils Relative %: 86 %
Platelets: 230 10*3/uL (ref 150–400)
RBC: 3.29 MIL/uL — ABNORMAL LOW (ref 3.87–5.11)
RDW: 11.9 % (ref 11.5–15.5)
WBC: 11.7 10*3/uL — ABNORMAL HIGH (ref 4.0–10.5)
nRBC: 0 % (ref 0.0–0.2)

## 2021-05-29 LAB — COMPREHENSIVE METABOLIC PANEL
ALT: 30 U/L (ref 0–44)
AST: 37 U/L (ref 15–41)
Albumin: 3.3 g/dL — ABNORMAL LOW (ref 3.5–5.0)
Alkaline Phosphatase: 89 U/L (ref 38–126)
Anion gap: 8 (ref 5–15)
BUN: 23 mg/dL (ref 8–23)
CO2: 29 mmol/L (ref 22–32)
Calcium: 8.7 mg/dL — ABNORMAL LOW (ref 8.9–10.3)
Chloride: 104 mmol/L (ref 98–111)
Creatinine, Ser: 0.59 mg/dL (ref 0.44–1.00)
GFR, Estimated: >60 mL/min (ref >60)
Glucose, Bld: 130 mg/dL — ABNORMAL HIGH (ref 70–99)
Potassium: 3.8 mmol/L (ref 3.5–5.1)
Sodium: 141 mmol/L (ref 135–145)
Total Bilirubin: 1 mg/dL (ref 0.3–1.2)
Total Protein: 6.3 g/dL — ABNORMAL LOW (ref 6.5–8.1)

## 2021-05-29 LAB — SURGICAL PCR SCREEN
MRSA, PCR: NEGATIVE
Staphylococcus aureus: NEGATIVE

## 2021-05-29 LAB — PHOSPHORUS: Phosphorus: 2.9 mg/dL (ref 2.5–4.6)

## 2021-05-29 LAB — MAGNESIUM: Magnesium: 2.3 mg/dL (ref 1.7–2.4)

## 2021-05-29 SURGERY — FIXATION, FRACTURE, INTERTROCHANTERIC, WITH INTRAMEDULLARY ROD
Anesthesia: General | Site: Hip | Laterality: Right

## 2021-05-29 MED ORDER — FENTANYL CITRATE PF 50 MCG/ML IJ SOSY
PREFILLED_SYRINGE | INTRAMUSCULAR | Status: AC
Start: 2021-05-29 — End: 2021-05-30
  Filled 2021-05-29: qty 2

## 2021-05-29 MED ORDER — ROCURONIUM BROMIDE 10 MG/ML (PF) SYRINGE
PREFILLED_SYRINGE | INTRAVENOUS | Status: DC | PRN
  Administered 2021-05-29: 20 mg via INTRAVENOUS

## 2021-05-29 MED ORDER — AMISULPRIDE (ANTIEMETIC) 5 MG/2ML IV SOLN
INTRAVENOUS | Status: AC
  Filled 2021-05-29: qty 4

## 2021-05-29 MED ORDER — GABAPENTIN 100 MG PO CAPS: 100.0000 mg | ORAL_CAPSULE | Freq: Every day | ORAL | Status: DC

## 2021-05-29 MED ORDER — POVIDONE-IODINE 10 % EX SWAB
2.0000 "application " | Freq: Once | CUTANEOUS | Status: AC
  Administered 2021-05-29: 2 via TOPICAL

## 2021-05-29 MED ORDER — ONDANSETRON HCL 4 MG/2ML IJ SOLN: 4.0000 mg | Freq: Four times a day (QID) | INTRAMUSCULAR | Status: DC | PRN

## 2021-05-29 MED ORDER — HYDROMORPHONE HCL 1 MG/ML IJ SOLN
0.5000 mg | INTRAMUSCULAR | Status: DC | PRN
  Administered 2021-05-29 – 2021-05-31 (×11): 1 mg via INTRAVENOUS
  Filled 2021-05-29 (×12): qty 1

## 2021-05-29 MED ORDER — PHENOL 1.4 % MT LIQD: 1.0000 | OROMUCOSAL | Status: DC | PRN

## 2021-05-29 MED ORDER — LIP MEDEX EX OINT
TOPICAL_OINTMENT | CUTANEOUS | Status: DC | PRN
  Administered 2021-05-29: 75 via TOPICAL
  Filled 2021-05-29: qty 7

## 2021-05-29 MED ORDER — FENTANYL CITRATE PF 50 MCG/ML IJ SOSY
PREFILLED_SYRINGE | INTRAMUSCULAR | Status: AC
  Filled 2021-05-29: qty 1

## 2021-05-29 MED ORDER — TRANEXAMIC ACID-NACL 1000-0.7 MG/100ML-% IV SOLN
1000.0000 mg | INTRAVENOUS | Status: AC
  Administered 2021-05-29: 1000 mg via INTRAVENOUS
  Filled 2021-05-29: qty 100

## 2021-05-29 MED ORDER — DOCUSATE SODIUM 100 MG PO CAPS
100.0000 mg | ORAL_CAPSULE | Freq: Two times a day (BID) | ORAL | Status: DC
  Administered 2021-05-29 – 2021-05-30 (×2): 100 mg via ORAL
  Filled 2021-05-29 (×8): qty 1

## 2021-05-29 MED ORDER — METOCLOPRAMIDE HCL 5 MG PO TABS: 5.0000 mg | ORAL_TABLET | Freq: Three times a day (TID) | ORAL | Status: DC | PRN

## 2021-05-29 MED ORDER — LIDOCAINE 2% (20 MG/ML) 5 ML SYRINGE
INTRAMUSCULAR | Status: DC | PRN
  Administered 2021-05-29: 60 mg via INTRAVENOUS

## 2021-05-29 MED ORDER — SUGAMMADEX SODIUM 200 MG/2ML IV SOLN
INTRAVENOUS | Status: DC | PRN
  Administered 2021-05-29: 200 mg via INTRAVENOUS

## 2021-05-29 MED ORDER — AMISULPRIDE (ANTIEMETIC) 5 MG/2ML IV SOLN: 10.0000 mg | Freq: Once | INTRAVENOUS | Status: DC | PRN

## 2021-05-29 MED ORDER — ENOXAPARIN SODIUM 30 MG/0.3ML IJ SOSY: 30.0000 mg | PREFILLED_SYRINGE | INTRAMUSCULAR | Status: DC

## 2021-05-29 MED ORDER — TRANEXAMIC ACID-NACL 1000-0.7 MG/100ML-% IV SOLN
INTRAVENOUS | Status: AC
  Filled 2021-05-29: qty 100

## 2021-05-29 MED ORDER — ENSURE ENLIVE PO LIQD
237.0000 mL | Freq: Three times a day (TID) | ORAL | Status: DC
Start: 2021-05-29 — End: 2021-05-29

## 2021-05-29 MED ORDER — PANTOPRAZOLE SODIUM 40 MG PO TBEC: 40.0000 mg | DELAYED_RELEASE_TABLET | Freq: Every day | ORAL | Status: DC

## 2021-05-29 MED ORDER — DEXAMETHASONE SODIUM PHOSPHATE 10 MG/ML IJ SOLN
INTRAMUSCULAR | Status: DC | PRN
  Administered 2021-05-29: 10 mg via INTRAVENOUS

## 2021-05-29 MED ORDER — PHENYLEPHRINE 80 MCG/ML (10ML) SYRINGE FOR IV PUSH (FOR BLOOD PRESSURE SUPPORT)
PREFILLED_SYRINGE | INTRAVENOUS | Status: DC | PRN
  Administered 2021-05-29 (×2): 80 ug via INTRAVENOUS

## 2021-05-29 MED ORDER — METOCLOPRAMIDE HCL 5 MG/ML IJ SOLN
5.0000 mg | Freq: Three times a day (TID) | INTRAMUSCULAR | Status: DC | PRN
  Administered 2021-06-02: 5 mg via INTRAVENOUS
  Filled 2021-05-29: qty 2

## 2021-05-29 MED ORDER — FENTANYL CITRATE (PF) 100 MCG/2ML IJ SOLN
INTRAMUSCULAR | Status: AC
  Filled 2021-05-29: qty 2

## 2021-05-29 MED ORDER — TRANEXAMIC ACID-NACL 1000-0.7 MG/100ML-% IV SOLN
1000.0000 mg | Freq: Once | INTRAVENOUS | Status: AC
  Administered 2021-05-29: 1000 mg via INTRAVENOUS
  Filled 2021-05-29: qty 100

## 2021-05-29 MED ORDER — ROSUVASTATIN CALCIUM 5 MG PO TABS: 5.0000 mg | ORAL_TABLET | Freq: Every day | ORAL | Status: DC

## 2021-05-29 MED ORDER — CEFAZOLIN SODIUM-DEXTROSE 2-4 GM/100ML-% IV SOLN
INTRAVENOUS | Status: AC
  Filled 2021-05-29: qty 100

## 2021-05-29 MED ORDER — SODIUM CHLORIDE 0.9 % IR SOLN
Status: DC | PRN
  Administered 2021-05-29: 1000 mL

## 2021-05-29 MED ORDER — FENTANYL CITRATE (PF) 100 MCG/2ML IJ SOLN
INTRAMUSCULAR | Status: DC | PRN
  Administered 2021-05-29 (×2): 50 ug via INTRAVENOUS

## 2021-05-29 MED ORDER — PROPOFOL 10 MG/ML IV BOLUS
INTRAVENOUS | Status: AC
  Filled 2021-05-29: qty 20

## 2021-05-29 MED ORDER — MENTHOL 3 MG MT LOZG: 1.0000 | LOZENGE | OROMUCOSAL | Status: DC | PRN

## 2021-05-29 MED ORDER — CEFAZOLIN SODIUM-DEXTROSE 2-4 GM/100ML-% IV SOLN
2.0000 g | Freq: Two times a day (BID) | INTRAVENOUS | Status: AC
  Administered 2021-05-29: 2 g via INTRAVENOUS
  Filled 2021-05-29: qty 100

## 2021-05-29 MED ORDER — HEPARIN SODIUM (PORCINE) 5000 UNIT/ML IJ SOLN
5000.0000 [IU] | Freq: Two times a day (BID) | INTRAMUSCULAR | Status: DC
  Administered 2021-05-30 – 2021-06-06 (×14): 5000 [IU] via SUBCUTANEOUS
  Filled 2021-05-29 (×15): qty 1

## 2021-05-29 MED ORDER — CHLORHEXIDINE GLUCONATE 4 % EX LIQD
60.0000 mL | Freq: Once | CUTANEOUS | Status: AC
  Administered 2021-05-29: 4 via TOPICAL
  Filled 2021-05-29: qty 60

## 2021-05-29 MED ORDER — ISOPROPYL ALCOHOL 70 % SOLN
Status: DC | PRN
  Administered 2021-05-29: 1 via TOPICAL

## 2021-05-29 MED ORDER — LACTATED RINGERS IV SOLN: INTRAVENOUS | Status: DC | PRN

## 2021-05-29 MED ORDER — ONDANSETRON HCL 4 MG PO TABS: 4.0000 mg | ORAL_TABLET | Freq: Four times a day (QID) | ORAL | Status: DC | PRN

## 2021-05-29 MED ORDER — FENTANYL CITRATE PF 50 MCG/ML IJ SOSY
25.0000 ug | PREFILLED_SYRINGE | INTRAMUSCULAR | Status: DC | PRN
  Administered 2021-05-29 (×3): 50 ug via INTRAVENOUS

## 2021-05-29 MED ORDER — CEFAZOLIN SODIUM-DEXTROSE 2-4 GM/100ML-% IV SOLN
2.0000 g | INTRAVENOUS | Status: AC
  Administered 2021-05-29: 2 g via INTRAVENOUS
  Filled 2021-05-29: qty 100

## 2021-05-29 SURGICAL SUPPLY — 50 items
ADH SKN CLS APL DERMABOND .7 (GAUZE/BANDAGES/DRESSINGS) ×2
APL PRP STRL LF DISP 70% ISPRP (MISCELLANEOUS) ×1
BAG COUNTER SPONGE SURGICOUNT (BAG) IMPLANT
BAG SPEC THK2 15X12 ZIP CLS (MISCELLANEOUS)
BAG SPNG CNTER NS LX DISP (BAG)
BAG ZIPLOCK 12X15 (MISCELLANEOUS) IMPLANT
BIT DRILL CANN LG 4.3MM (BIT) IMPLANT
CHLORAPREP W/TINT 26 (MISCELLANEOUS) ×2 IMPLANT
COVER PERINEAL POST (MISCELLANEOUS) ×2 IMPLANT
COVER SURGICAL LIGHT HANDLE (MISCELLANEOUS) ×2 IMPLANT
DERMABOND ADVANCED (GAUZE/BANDAGES/DRESSINGS) ×2
DERMABOND ADVANCED .7 DNX12 (GAUZE/BANDAGES/DRESSINGS) ×2 IMPLANT
DRAPE C-ARM 42X120 X-RAY (DRAPES) ×2 IMPLANT
DRAPE C-ARMOR (DRAPES) ×2 IMPLANT
DRAPE IMP U-DRAPE 54X76 (DRAPES) ×4 IMPLANT
DRAPE SHEET LG 3/4 BI-LAMINATE (DRAPES) ×4 IMPLANT
DRAPE STERI IOBAN 125X83 (DRAPES) ×2 IMPLANT
DRAPE U-SHAPE 47X51 STRL (DRAPES) ×4 IMPLANT
DRESSING MEPILEX FLEX 4X4 (GAUZE/BANDAGES/DRESSINGS) ×2 IMPLANT
DRILL BIT CANN LG 4.3MM (BIT) ×2
DRSG MEPILEX BORDER 4X8 (GAUZE/BANDAGES/DRESSINGS) IMPLANT
DRSG MEPILEX FLEX 4X4 (GAUZE/BANDAGES/DRESSINGS) ×6
ELECT BLADE TIP CTD 4 INCH (ELECTRODE) IMPLANT
FACESHIELD WRAPAROUND (MASK) ×4 IMPLANT
FACESHIELD WRAPAROUND OR TEAM (MASK) ×2 IMPLANT
GAUZE SPONGE 4X4 12PLY STRL (GAUZE/BANDAGES/DRESSINGS) ×2 IMPLANT
GLOVE BIO SURGEON STRL SZ8.5 (GLOVE) ×4 IMPLANT
GLOVE BIOGEL M 7.0 STRL (GLOVE) ×2 IMPLANT
GLOVE BIOGEL PI IND STRL 7.5 (GLOVE) ×1 IMPLANT
GLOVE BIOGEL PI IND STRL 8 (GLOVE) ×1 IMPLANT
GLOVE BIOGEL PI IND STRL 8.5 (GLOVE) ×1 IMPLANT
GLOVE BIOGEL PI INDICATOR 7.5 (GLOVE) ×1
GLOVE BIOGEL PI INDICATOR 8 (GLOVE) ×1
GLOVE BIOGEL PI INDICATOR 8.5 (GLOVE) ×1
GLOVE SURG LX 7.5 STRW (GLOVE) ×2
GLOVE SURG LX STRL 7.5 STRW (GLOVE) ×2 IMPLANT
GOWN SPEC L3 XXLG W/TWL (GOWN DISPOSABLE) ×2 IMPLANT
GUIDEPIN VERSANAIL DSP 3.2X444 (ORTHOPEDIC DISPOSABLE SUPPLIES) ×1 IMPLANT
HFN 125 DEG 11MM X 180MM (Orthopedic Implant) ×1 IMPLANT
HFN LAG SCREW 10.5MM X 75MM (Screw) ×2 IMPLANT
KIT BASIN OR (CUSTOM PROCEDURE TRAY) ×2 IMPLANT
MANIFOLD NEPTUNE II (INSTRUMENTS) ×2 IMPLANT
MARKER SKIN DUAL TIP RULER LAB (MISCELLANEOUS) ×2 IMPLANT
PACK GENERAL/GYN (CUSTOM PROCEDURE TRAY) ×2 IMPLANT
SCREW BONE CORTICAL 5.0X30 (Screw) ×1 IMPLANT
SCREW HFN LAG 6.5X75 OD 10.5 (Screw) IMPLANT
SUT MNCRL AB 3-0 PS2 18 (SUTURE) ×2 IMPLANT
SUT MON AB 2-0 CT1 36 (SUTURE) ×1 IMPLANT
SUT VIC AB 1 CT1 36 (SUTURE) ×2 IMPLANT
TOWEL OR 17X26 10 PK STRL BLUE (TOWEL DISPOSABLE) ×2 IMPLANT

## 2021-05-29 NOTE — Discharge Instructions (Signed)
 Dr. Reyansh Kushnir Adult Hip & Knee Specialist [facility] [address] [place], [place] [address] [phone]   POSTOPERATIVE DIRECTIONS    Hip Rehabilitation, Guidelines Following Surgery   WEIGHT BEARING Weight bearing as tolerated with assist device (walker, cane, etc) as directed, use it as long as suggested by your surgeon or therapist, typically at least 4-6 weeks.   HOME CARE INSTRUCTIONS  Remove items at home which could result in a fall. This includes throw rugs or furniture in walking pathways.  Continue medications as instructed at time of discharge.  You may have some home medications which will be placed on hold until you complete the course of blood thinner medication.  4 days after discharge, you may start showering. No tub baths or soaking your incisions. Do not put on socks or shoes without following the instructions of your caregivers.   Sit on chairs with arms. Use the chair arms to help push yourself up when arising.  Arrange for the use of a toilet seat elevator so you are not sitting low.   Walk with walker as instructed.  You may resume a sexual relationship in one month or when given the OK by your caregiver.  Use walker as long as suggested by your caregivers.  Avoid periods of inactivity such as sitting longer than an hour when not asleep. This helps prevent blood clots.  You may return to work once you are cleared by your surgeon.  Do not drive a car for 6 weeks or until released by your surgeon.  Do not drive while taking narcotics.  Wear elastic stockings for two weeks following surgery during the day but you may remove then at night.  Make sure you keep all of your appointments after your operation with all of your doctors and caregivers. You should call the office at the above phone number and make an appointment for approximately two weeks after the date of your surgery. Please pick up a stool softener and laxative  for home use as long as you are requiring pain medications.  ICE to the affected hip every three hours for 30 minutes at a time and then as needed for pain and swelling. Continue to use ice on the hip for pain and swelling from surgery. You may notice swelling that will progress down to the foot and ankle.  This is normal after surgery.  Elevate the leg when you are not up walking on it.   It is important for you to complete the blood thinner medication as prescribed by your doctor.  Continue to use the breathing machine which will help keep your temperature down.  It is common for your temperature to cycle up and down following surgery, especially at night when you are not up moving around and exerting yourself.  The breathing machine keeps your lungs expanded and your temperature down.  RANGE OF MOTION AND STRENGTHENING EXERCISES  These exercises are designed to help you keep full movement of your hip joint. Follow your caregiver's or physical therapist's instructions. Perform all exercises about fifteen times, three times per day or as directed. Exercise both hips, even if you have had only one joint replacement. These exercises can be done on a training (exercise) mat, on the floor, on a table or on a bed. Use whatever works the best and is most comfortable for you. Use music or television while you are exercising so that the exercises are a pleasant break in your day. This   will make your life better with the exercises acting as a break in routine you can look forward to.  Lying on your back, slowly slide your foot toward your buttocks, raising your knee up off the floor. Then slowly slide your foot back down until your leg is straight again.  Lying on your back spread your legs as far apart as you can without causing discomfort.  Lying on your side, raise your upper leg and foot straight up from the floor as far as is comfortable. Slowly lower the leg and repeat.  Lying on your back, tighten up the  muscle in the front of your thigh (quadriceps muscles). You can do this by keeping your leg straight and trying to raise your heel off the floor. This helps strengthen the largest muscle supporting your knee.  Lying on your back, tighten up the muscles of your buttocks both with the legs straight and with the knee bent at a comfortable angle while keeping your heel on the floor.   SKILLED REHAB INSTRUCTIONS: If the patient is transferred to a skilled rehab facility following release from the hospital, a list of the current medications will be sent to the facility for the patient to continue.  When discharged from the skilled rehab facility, please have the facility set up the patient's Home Health Physical Therapy prior to being released. Also, the skilled facility will be responsible for providing the patient with their medications at time of release from the facility to include their pain medication and their blood thinner medication. If the patient is still at the rehab facility at time of the two week follow up appointment, the skilled rehab facility will also need to assist the patient in arranging follow up appointment in our office and any transportation needs.  MAKE SURE YOU:  Understand these instructions.  Will watch your condition.  Will get help right away if you are not doing well or get worse.  Pick up stool softner and laxative for home use following surgery while on pain medications. Daily dry dressing changes as needed. In 4 days, you may remove your dressings and begin taking showers - no tub baths or soaking the incisions. Continue to use ice for pain and swelling after surgery. Do not use any lotions or creams on the incision until instructed by your surgeon.   

## 2021-05-29 NOTE — Anesthesia Procedure Notes (Signed)
Procedure Name: Intubation Date/Time: 05/29/2021 12:55 PM Performed by: Gerald Leitz, CRNA Pre-anesthesia Checklist: Patient identified, Patient being monitored, Timeout performed, Emergency Drugs available and Suction available Patient Re-evaluated:Patient Re-evaluated prior to induction Oxygen Delivery Method: Circle system utilized Preoxygenation: Pre-oxygenation with 100% oxygen Induction Type: IV induction Ventilation: Mask ventilation without difficulty Laryngoscope Size: Mac and 3 Grade View: Grade I Tube type: Oral Tube size: 7.0 mm Number of attempts: 1 Airway Equipment and Method: Stylet Placement Confirmation: ETT inserted through vocal cords under direct vision, positive ETCO2 and breath sounds checked- equal and bilateral Secured at: 21 cm Tube secured with: Tape Dental Injury: Teeth and Oropharynx as per pre-operative assessment

## 2021-05-29 NOTE — Progress Notes (Signed)
CT hip reviewed: patient has comminuted right intertrochanteric fx. Discussed R/B/A of IM nail R femur with the patient's son, Onalee Hua. He wishes to proceed. Plan for surgery today.Cont NPO.  The risks, benefits, and alternatives were discussed with the patient's son. There are risks associated with the surgery including, but not limited to, problems with anesthesia (death), infection, differences in leg length/angulation/rotation, fracture of bones, loosening or failure of implants, malunion, nonunion, hematoma (blood accumulation) which may require surgical drainage, blood clots, pulmonary embolism, nerve injury (foot drop), and blood vessel injury. The patient's son understands these risks and elects to proceed.

## 2021-05-29 NOTE — Progress Notes (Signed)
PROGRESS NOTE    Julie Durham  [id number] DOB: July 13, 1940 [name]: 05/27/2021 PCP: Pcp, No  Chief Complaint  Patient presents with   Fall    Brief Narrative:  Julie Durham is Julie Durham 81 y.o. female with medical history significant of osteoporosis with compression fractures, CAD, takutsubo's cardiomyopathy, hypertension and multiple other medical problems presenting after Julie Durham fall.    Assessment & Plan:   Principal Problem:   Hip fracture [facility]) Active Problems:   Fall against object   Closed right scapular fracture   Chest pain   Dementia ([name])   Frailty   Essential hypertension   History of cardiomyopathy   History of CAD (coronary artery disease)   Infestation by bed bug   DNR (do not resuscitate)   Osteoporosis   Protein-calorie malnutrition, severe   Bladder distention   Assessment and Plan: * Hip fracture ([name]) Plain films with displaced R femoral neck fracture CT with comminuted intertrochanteric fx of the proximal R femor with varus angulation, moderate compression of the L5 vertebral body which appears acute or subacute, partially visualized L3 compression fx, age indeterminate, remote healed right superior and inferior pubic rami fx Ortho c/s, appreciate assistance Pain management, scheduled apap, oxy, dilaudid Post op DVT ppx per ortho, SCD's for now Was walking with walker prior to her fall, has been intermittently wheelchair bound at times in the past as well RCRI at least 2 with hx CAD and cardiomyopathy (though most recent echo with improved EF), class 2, 10% 30 day risk death, MI, cardiac arrest.  She's frail with BMI of 13. She was confused on my exam and c/o back pain at one time mentioned CP, but difficult to clarify exactly with her pain/confusion.  CXR without active disease. Will check EKG (NSR, negative troponins x2), I don't think any additional testing warranted prior to surgery.        Closed right scapular fracture [name] out of wheelchair about 1  week prior to admission with shoulder pain Plain film with angulated fx at inferior aspect of R scapula Per orthopedics - sling when up, WBAT -> needs f/u with upper extremity physician outpatient  Fall against object [facility] hitting hip on table and on ground She's complaining about back pain in the room, but holding her R hip (suspect this is due to R hip fx).   Head CT without acute abnormalities L spine with multilevel compression deformities noted within lumbar spine, prior vertebral augmentation.  Limited exam by severe osteoporosis.  T spine limited due to osteopenia, consider thoracic CT Moderate to severe L3 and L5 compression fx, prior augmentation to L1 and L4.   Severe osteopenia with chronic thoracic compression fractures at 7 vertebral levels.  Moderate T5 and severe T7 compression fx new since 2020, but age indeterminate otherwise.    Frailty Body mass index is 13.13 kg/m.  Dietitian c/s  Dementia [facility]) Delirium precautions  Chest pain Limited hx with dementia Low suspicion cardiac, r/o ACS Difficult with her dementia and uncontrolled pain Follow EKG and troponin (negative troponin x2)   History of cardiomyopathy Echo 05/2019 with EF 123456, grade 1 diastolic dysfunction Not overloaded on exam todaay  Essential hypertension Metoprolol Significantly elevated -> treat pain Will dose arb today, hold on day of surgery   History of CAD (coronary artery disease) LHC 10/2018 with mild nonobstructive CAD, mderate segmental LV systolic dysfunction in periapical pattern likely secondary to takotsubo syndrome Continue metoprolol Resume aspirin post op Continue crestor  Infestation by bed  bug Bed bugs reported on arrival, none noted by EDP Will continue contact precautions for now and monitor closely  Osteoporosis Hx compression fx with prior vertebral augmentation Needs outpatient treatment  DNR (do not resuscitate) Noted on most form (not completely filled out -  see media) and confirmed with son  Protein-calorie malnutrition, severe RD  Bladder distention Needs TOV after surgery       DVT prophylaxis: SCD Code Status: DNR Family Communication: son Disposition:   Status is: Inpatient Remains inpatient appropriate because: need for ortho intervention   Consultants:  orthopedics  Procedures:  none  Antimicrobials:  Anti-infectives (From admission, onward)    Start     Dose/Rate Route Frequency Ordered Stop   05/29/21 1150  ceFAZolin (ANCEF) 2-4 GM/100ML-% IVPB       Note to Pharmacy: Dara Lords M: cabinet override      05/29/21 1150 05/29/21 1316   05/29/21 0900  ceFAZolin (ANCEF) IVPB 2g/100 mL premix        2 g 200 mL/hr over 30 Minutes Intravenous On call to O.R. 05/29/21 0803 05/29/21 1317       Subjective: C/o pain  Objective: Vitals:   05/28/21 1838 05/28/21 2005 05/29/21 0417 05/29/21 0931  BP:  (!) 157/98 (!) 168/90   Pulse:  (!) 110 88 (!) 112  Resp:  20 18   Temp:  98.2 F (36.8 C) 98 F (36.7 C)   TempSrc:  Oral Axillary   SpO2: 98% 92% 97%   Weight:      Height:        Intake/Output Summary (Last 24 hours) at 05/29/2021 1344 Last data filed at 05/29/2021 1307 Gross per 24 hour  Intake 939.12 ml  Output 150 ml  Net 789.12 ml   Filed Weights   05/27/21 1426  Weight: 29.5 kg    Examination:  General: c/o pain  Cardiovascular: RRR Lungs: unlabored Neurological: disoriented, moving all extremities Extremities: ;aying on R hip   Data Reviewed: I have personally reviewed following labs and imaging studies  CBC: Recent Labs  Lab 05/27/21 1431 05/28/21 0405 05/29/21 0641  WBC 5.8 9.1 11.7*  NEUTROABS 4.5  --  10.1*  HGB 12.6 10.1* 10.0*  HCT 37.6 30.3* 31.4*  MCV 94.2 95.3 95.4  PLT 269 203 [id number]    Basic Metabolic Panel: Recent Labs  Lab 05/27/21 1431 05/28/21 0405 05/29/21 0542  NA 140 140 141  K 3.2* 3.5 3.8  CL 105 105 104  CO2 28 30 29   GLUCOSE 143* 127* 130*  BUN 16  19 23   CREATININE 0.51 0.51 0.59  CALCIUM 8.3* 8.4* 8.7*  MG  --   --  2.3  PHOS  --   --  2.9    GFR: Estimated Creatinine Clearance: 25.7 mL/min (by C-G formula based on SCr of 0.59 mg/dL).  Liver Function Tests: Recent Labs  Lab 05/27/21 1431 05/28/21 0405 05/29/21 0542  AST 19 57* 37  ALT 17 35 30  ALKPHOS 98 90 89  BILITOT 0.5 0.7 1.0  PROT 6.3* 5.5* 6.3*  ALBUMIN 3.6 3.0* 3.3*    CBG: No results for input(s): GLUCAP in the last 168 hours.   Recent Results (from the past 240 hour(s))  MRSA Next Gen by PCR, Nasal     Status: None   Collection Time: 05/27/21 11:57 PM   Specimen: Nasal Mucosa; Nasal Swab  Result Value Ref Range Status   MRSA by PCR Next Gen NOT DETECTED NOT DETECTED Final  Comment: (NOTE) The GeneXpert MRSA Assay (FDA approved for NASAL specimens only), is one component of [place] comprehensive MRSA colonization surveillance program. It is not intended to diagnose MRSA infection nor to guide or monitor treatment for MRSA infections. Test performance is not FDA approved in patients less than 57 years old. Performed at [facility], [place] [address] [place], [place] [address]   Surgical pcr screen     Status: None   Collection Time: 05/29/21  8:37 AM   Specimen: Nasal Mucosa; Nasal Swab  Result Value Ref Range Status   MRSA, PCR NEGATIVE NEGATIVE Final   Staphylococcus aureus NEGATIVE NEGATIVE Final    Comment: (NOTE) The Xpert SA Assay (FDA approved for NASAL specimens in patients 70 years of age and older), is one component of [facility] comprehensive surveillance program. It is not intended to diagnose infection nor to guide or monitor treatment. Performed at [facility], [place] [address] [place], [place] [address]          Radiology Studies: DG Shoulder Right  Result Date: 05/27/2021 CLINICAL DATA:  Recent fall with right shoulder pain, initial encounter EXAM: RIGHT SHOULDER - 2+ VIEW COMPARISON:  None  Available. FINDINGS: Humeral head is well seated. Clavicle is well visualized and within normal limits. There is an angulated fracture at the inferior aspect of the scapula incompletely evaluated on this exam. No other focal abnormality is noted. IMPRESSION: Apparent angulated fracture at the inferior aspect of the right scapula. No other focal abnormality is noted. Electronically Signed   By: Inez Catalina M.D.   On: 05/27/2021 18:47   CT Head Wo Contrast  Result Date: 05/27/2021 CLINICAL DATA:  Head trauma. EXAM: CT HEAD WITHOUT CONTRAST TECHNIQUE: Contiguous axial images were obtained from the base of the skull through the vertex without intravenous contrast. RADIATION DOSE REDUCTION: This exam was performed according to the departmental dose-optimization program which includes automated exposure control, adjustment of the mA and/or kV according to patient size and/or use of iterative reconstruction technique. COMPARISON:  January 04, 2019. FINDINGS: Brain: Mild diffuse cortical atrophy is noted. Mild chronic ischemic white matter disease is noted. No mass effect or midline shift is noted. Ventricular size is within normal limits. There is no evidence of mass lesion, hemorrhage or acute infarction. Vascular: No hyperdense vessel or unexpected calcification. Skull: Normal. Negative for fracture or focal lesion. Sinuses/Orbits: No acute finding. Other: None. IMPRESSION: No acute intracranial abnormality seen. Electronically Signed   By: Marijo Conception M.D.   On: 05/27/2021 16:39   CT THORACIC SPINE WO CONTRAST  Result Date: 05/29/2021 CLINICAL DATA:  81 year old female status post fall. Hip fracture. EXAM: CT THORACIC SPINE WITHOUT CONTRAST TECHNIQUE: Multidetector CT images of the thoracic were obtained using the standard protocol without intravenous contrast. RADIATION DOSE REDUCTION: This exam was performed according to the departmental dose-optimization program which includes automated exposure  control, adjustment of the mA and/or kV according to patient size and/or use of iterative reconstruction technique. COMPARISON:  CTA chest 12/26/2018. FINDINGS: Limited cervical spine imaging: Cervicothoracic junction alignment is within normal limits. Thoracic spine segmentation:  Normal. Alignment: Chronic but increased thoracic kyphosis compared to 12/26/2018. No significant thoracic spondylolisthesis. Vertebrae: Chronic severe osteopenia. Chronic T3 vertebra plana. Chronic T6, and T8 through T11 compression fractures appear stable since 2020. Mild T12 inferior endplate compression also chronic and stable. T1 and T2 appears stable and intact. T4 appears stable and intact. Moderate new T5 compression fractures since 12/26/2018. No  retropulsion. T5 posterior elements appear to remain intact. Severe T7 compression fracture is new since 12/26/2018. No significant retropulsion. T7 posterior elements appears stable and intact. Rib osteopenia and mild respiratory motion. No obvious acute posterior rib fracture. Paraspinal and other soft tissues: Lungs appear hyperinflated. Visible major airways are patent. No pericardial or pleural effusion is evident. Calcified aortic atherosclerosis. Thoracic paraspinal soft tissues remain within normal limits. Disc levels: Despite the extensive thoracic compression fractures, the thoracic spinal canal is capacious. No CT evidence of thoracic spinal stenosis. IMPRESSION: 1. Severe osteopenia with chronic thoracic compression fractures at 7 vertebral levels (including chronic vertebral plana at T3). Moderate T5 and Severe T7 compression fractures are new since 12/26/2018 but otherwise age indeterminate. No retropulsion or complicating features. 2. No CT evidence of thoracic spinal stenosis. 3. Aortic Atherosclerosis (ICD10-I70.0) and Emphysema (ICD10-J43.9). Electronically Signed   By: Odessa FlemingH  Hall M.D.   On: 05/29/2021 05:46   CT LUMBAR SPINE WO CONTRAST  Result Date:  05/29/2021 CLINICAL DATA:  81 year old female status post fall. Hip fracture. EXAM: CT LUMBAR SPINE WITHOUT CONTRAST TECHNIQUE: Multidetector CT imaging of the lumbar spine was performed without intravenous contrast administration. Multiplanar CT image reconstructions were also generated. RADIATION DOSE REDUCTION: This exam was performed according to the departmental dose-optimization program which includes automated exposure control, adjustment of the mA and/or kV according to patient size and/or use of iterative reconstruction technique. COMPARISON:  Thoracic spine CT today reported separately. Lumbar MRI 09/15/2018. FINDINGS: Segmentation: Normal, concordant with the thoracic spine numbering today. Alignment: Straightening of lumbar lordosis has not significantly changed since 2020. Vertebrae: Diffuse severe osteopenia. Thoracic levels are reported separately today. Chronically augmented L1 and L4 compression fractures. Chronic severe L2 compression fracture is stable from the 2020 MRI. New moderate to severe L3 and L5 compression fractures since that time. Mild retropulsion of bone at both levels. Pedicles and posterior elements of both levels appear to remain intact. Grossly intact visible sacrum and SI joints. Paraspinal and other soft tissues: Partially visible urinary bladder distension appears moderate to severe (series 8, image 90). Aortoiliac calcified atherosclerosis. Cholecystectomy clips. Lumbar paraspinal soft tissues remain within normal limits. Disc levels: Underlying capacious spinal canal is demonstrated on the 2020 MRI. Despite the diffuse lumbar compression fractures and some associated retropulsion no significant lumbar spinal stenosis is suspected. IMPRESSION: 1. Diffuse severe osteopenia. Moderate to severe L3 and L5 compression fractures are new since 10/05/2018, otherwise age indeterminate. Mild retropulsion but no significant lumbar spinal stenosis by CT. 2. Other lumbar levels are  chronically compressed. L1 and L4 previously augmented. 3. Partially visible distended urinary bladder. Query Urinary Retention and recommend Foley catheter decompression. 4. Thoracic Spine reported separately today. Aortic Atherosclerosis (ICD10-I70.0). Electronically Signed   By: Odessa FlemingH  Hall M.D.   On: 05/29/2021 05:51   DG Lumbar Spine 1 View  Result Date: 05/27/2021 CLINICAL DATA:  Recent fall with low back pain, initial encounter EXAM: LUMBAR SPINE - 1 VIEW COMPARISON:  10/22/2018, 10/05/2018 FINDINGS: Limited single lateral cross-table view of the lumbar spine was obtained. There are changes consistent with prior vertebral augmentation at L4 and L1. Chronic compression fracture is noted at L2 similar to that seen on prior MRI. Apparent L1 and L5 compression deformities are noted new from the prior MRI but possibly chronic in nature. IMPRESSION: Changes of prior vertebral augmentation at L1 and L4. Multilevel compression deformities are noted within the lumbar spine. The exam is limited by severe osteoporosis. Electronically Signed   By: Loraine LericheMark  Lukens M.D.   On: 05/27/2021 18:45   DG Thoracic Spine 1 View  Result Date: 05/27/2021 CLINICAL DATA:  Fall. EXAM: OPERATIVE THORACIC SPINE cross-table lateral VIEW(S) COMPARISON:  CT of the chest 12/26/2018. FINDINGS: Examination is significantly limited secondary to patient positioning and diffuse osteopenia. Multilevel thoracic compression deformities are likely present, but not well evaluated on this study. These were seen on prior CT. Vertebroplasty changes at L1 are again noted. IMPRESSION: 1. Single lateral view of the thoracic spine is significantly limited secondary to osteopenia. Multilevel compression deformities are likely present as seen on prior CT, but not well evaluated. This could be better evaluated with dedicated thoracic CT if clinically warranted. Electronically Signed   By: Darliss Cheney M.D.   On: 05/27/2021 18:44   CT HIP RIGHT WO  CONTRAST  Result Date: 05/28/2021 CLINICAL DATA:  Right hip fracture EXAM: CT OF THE RIGHT HIP WITHOUT CONTRAST TECHNIQUE: Multidetector CT imaging of the right hip was performed according to the standard protocol. Multiplanar CT image reconstructions were also generated. RADIATION DOSE REDUCTION: This exam was performed according to the departmental dose-optimization program which includes automated exposure control, adjustment of the mA and/or kV according to patient size and/or use of iterative reconstruction technique. COMPARISON:  X-ray 05/27/2021 FINDINGS: Bones/Joint/Cartilage Comminuted intertrochanteric fracture of the proximal right femur with varus angulation. No fracture involvement of the femoral head. Hip joint alignment is maintained without dislocation. Remote healed right superior and inferior pubic rami fractures. No pelvic diastasis. Moderate compression fracture of the L5 vertebral body which appears acute or subacute. Partially visualized L3 compression fracture, age indeterminate. Prior cement augmentation of Joash Tony L4 vertebral body fracture. Bones are diffusely demineralized. Ligaments Suboptimally assessed by CT. Muscles and Tendons No acute musculotendinous abnormality by CT. Soft tissues Negative for hematoma. Diverticular changes of the included sigmoid colon. IMPRESSION: 1. Comminuted intertrochanteric fracture of the proximal right femur with varus angulation. 2. Moderate compression fracture of the L5 vertebral body which appears acute or subacute. 3. Partially visualized L3 compression fracture, age indeterminate. 4. Remote healed right superior and inferior pubic rami fractures. Electronically Signed   By: Duanne Guess D.O.   On: 05/28/2021 14:16   DG Chest Portable 1 View  Result Date: 05/27/2021 CLINICAL DATA:  Fall. EXAM: PORTABLE CHEST 1 VIEW COMPARISON:  February 26, 2021 FINDINGS: Elevation of the left hemidiaphragm is identified. The cardiomediastinal silhouette is  normal. No pneumothorax. No nodules or masses. No focal infiltrates or overt edema. No acute abnormalities are identified. IMPRESSION: No active disease. Electronically Signed   By: Gerome Sam III M.D.   On: 05/27/2021 16:09   DG Knee Right Port  Result Date: 05/28/2021 CLINICAL DATA:  Fracture neck of right femur, pain EXAM: PORTABLE RIGHT KNEE - 1-2 VIEW COMPARISON:  05/27/2021 FINDINGS: Osteopenia is seen in bony structures. No displaced fracture or dislocation is seen in the right knee. AP view is less than optimal due to patient's clinical condition. Right knee is kept in flexed position. There is no definite effusion in the suprapatellar bursa. IMPRESSION: No fracture or dislocation is seen in the limited examination of right knee. Electronically Signed   By: Ernie Avena M.D.   On: 05/28/2021 09:45   DG Hip Unilat W or Wo Pelvis 2-3 Views Right  Result Date: 05/27/2021 CLINICAL DATA:  Fall. EXAM: DG HIP (WITH OR WITHOUT PELVIS) 2-3V RIGHT COMPARISON:  Right hip x-ray 07/12/2011. FINDINGS: There is an acute fracture of the right femoral neck. There is superolateral  displacement of the distal fracture fragment. There is no dislocation. The bones are osteopenic. Vertebroplasty changes are seen in the approximate L4 level. There is likely Siedah Sedor healed right inferior pubic ramus fracture. IMPRESSION: 1. Displaced right femoral neck fracture. Electronically Signed   By: Ronney Asters M.D.   On: 05/27/2021 16:14        Scheduled Meds:  [MAR Hold] acetaminophen  1,000 mg Oral Q8H   amisulpride       [MAR Hold] feeding supplement  237 mL Oral TID BM   fentaNYL       [MAR Hold] gabapentin  100 mg Oral QHS   [MAR Hold] irbesartan  150 mg Oral Daily   [MAR Hold] lidocaine  1 patch Transdermal Q24H   [MAR Hold] metoprolol tartrate  25 mg Oral BID   [MAR Hold] multivitamin with minerals  1 tablet Oral Daily   [MAR Hold] pantoprazole  40 mg Oral Daily   [MAR Hold] rosuvastatin  5 mg Oral  Daily   Continuous Infusions:  dextrose 5% lactated ringers 50 mL/hr at 05/29/21 0521     LOS: 2 days    Time spent: over 30 min    Fayrene Helper, MD [facility]   To contact the attending provider between 7A-7P or the covering provider during after hours 7P-7A, please log into the web site [email or web address] and access using universal [place] password for that web site. If you do not have the password, please call the hospital operator.  05/29/2021, 1:44 PM

## 2021-05-29 NOTE — Progress Notes (Signed)
Patient has had complete bath with bed lined changed and hair washed.  CT called, but unavailable at this time, when call RN when CT can be done.

## 2021-05-29 NOTE — Assessment & Plan Note (Addendum)
Continue indwelling Foley catheter

## 2021-05-29 NOTE — Interval H&P Note (Signed)
History and Physical Interval Note:  05/29/2021 12:07 PM  Julie Durham  has presented today for surgery, with the diagnosis of RIGHT FEMORAL NECK FRACTURE.  The various methods of treatment have been discussed with the patient and family. After consideration of risks, benefits and other options for treatment, the patient has consented to  Procedure(s): INTRAMEDULLARY (IM) NAIL INTERTROCHANTRIC (Right) as a surgical intervention.  The patient's history has been reviewed, patient examined, no change in status, stable for surgery.  I have reviewed the patient's chart and labs.  Questions were answered to the patient's satisfaction.    The risks, benefits, and alternatives were discussed with the patient's son, Onalee Hua. There are risks associated with the surgery including, but not limited to, problems with anesthesia (death), infection, differences in leg length/angulation/rotation, fracture of bones, loosening or failure of implants, malunion, nonunion, hematoma (blood accumulation) which may require surgical drainage, blood clots, pulmonary embolism, nerve injury (foot drop), and blood vessel injury. The patient's son understands these risks and elects to proceed.    Iline Oven Vennessa Affinito

## 2021-05-29 NOTE — Anesthesia Preprocedure Evaluation (Addendum)
Anesthesia Evaluation  Patient identified by MRN, date of birth, ID band Patient awake    Reviewed: Allergy & Precautions, NPO status , Patient's Chart, lab work & pertinent test results  Airway Mallampati: II  TM Distance: >3 FB Neck ROM: Full    Dental   Pulmonary asthma ,    breath sounds clear to auscultation       Cardiovascular hypertension, Pt. on medications + CAD and +CHF   Rhythm:Regular Rate:Normal     Neuro/Psych Dementia  Neuromuscular disease    GI/Hepatic negative GI ROS, Neg liver ROS,   Endo/Other  negative endocrine ROS  Renal/GU negative Renal ROS     Musculoskeletal  (+) Arthritis ,   Abdominal   Peds  Hematology  (+) Blood dyscrasia, anemia ,   Anesthesia Other Findings   Reproductive/Obstetrics                             Lab Results  Component Value Date   WBC 11.7 (H) 05/29/2021   HGB 10.0 (L) 05/29/2021   HCT 31.4 (L) 05/29/2021   MCV 95.4 05/29/2021   PLT 230 05/29/2021   Lab Results  Component Value Date   CREATININE 0.59 05/29/2021   BUN 23 05/29/2021   NA 141 05/29/2021   K 3.8 05/29/2021   CL 104 05/29/2021   CO2 29 05/29/2021    Anesthesia Physical Anesthesia Plan  ASA: 4  Anesthesia Plan: General   Post-op Pain Management: Ofirmev IV (intra-op)* and Minimal or no pain anticipated   Induction: Intravenous  PONV Risk Score and Plan: 3 and Dexamethasone, Ondansetron and Treatment may vary due to age or medical condition  Airway Management Planned: Oral ETT and LMA  Additional Equipment: None  Intra-op Plan:   Post-operative Plan: Extubation in OR  Informed Consent: I have reviewed the patients History and Physical, chart, labs and discussed the procedure including the risks, benefits and alternatives for the proposed anesthesia with the patient or authorized representative who has indicated his/her understanding and acceptance.    Patient has DNR.  Discussed DNR with power of attorney and Suspend DNR.   Dental advisory given  Plan Discussed with: CRNA  Anesthesia Plan Comments:        Anesthesia Quick Evaluation

## 2021-05-29 NOTE — Progress Notes (Signed)
Patient back from CT.

## 2021-05-29 NOTE — Transfer of Care (Signed)
Immediate Anesthesia Transfer of Care Note  Patient: Julie Durham  Procedure(s) Performed: Procedure(s): INTRAMEDULLARY (IM) NAIL INTERTROCHANTRIC (Right)  Patient Location: PACU  Anesthesia Type:General  Level of Consciousness: Alert, Awake, Oriented  Airway & Oxygen Therapy: Patient Spontanous Breathing  Post-op Assessment: Report given to RN  Post vital signs: Reviewed and stable  Last Vitals:  Vitals:   05/29/21 0417 05/29/21 0931  BP: (!) 168/90   Pulse: 88 (!) 112  Resp: 18   Temp: 36.7 C   SpO2: 97%     Complications: No apparent anesthesia complications

## 2021-05-29 NOTE — Op Note (Signed)
OPERATIVE REPORT  SURGEON: Samson Frederic, MD   ASSISTANT: Herbert Pun, PA-C.  PREOPERATIVE DIAGNOSIS: Comminuted Right peritrochanteric femur fracture.   POSTOPERATIVE DIAGNOSIS: Comminuted Right peritrochanteric femur fracture.   PROCEDURE: Intramedullary fixation, Right femur.   IMPLANTS: Biomet Affixus Hip Fracture Nail, 11 by 180 mm, 125 degrees. 10.5 x 75 mm Hip Fracture Nail Lag Screw. 5 x 30 mm distal interlocking screw 1.  ANESTHESIA:  General  ESTIMATED BLOOD LOSS:50 mL.  ANTIBIOTICS: 2 g Ancef.  DRAINS: None.  COMPLICATIONS: None.   CONDITION: PACU - hemodynamically stable.   BRIEF CLINICAL NOTE: Julie Durham is a 81 y.o. female who presented with an intertrochanteric femur fracture. The patient was admitted to the hospitalist service and underwent perioperative risk stratification and medical optimization. The risks, benefits, and alternatives to the procedure were explained, and the patient elected to proceed.  PROCEDURE IN DETAIL: Surgical site was marked by myself. The patient was taken to the operating room and anesthesia was induced on the bed. The patient was then transferred to the [facility] table and the nonoperative lower extremity was scissored underneath the operative side. The fracture was reduced with traction, internal rotation, and adduction. Apex anterior deformity had to be held corrected by the surgical assistant throughout the case. The hip was prepped and draped in the normal sterile surgical fashion. Timeout was called verifying side and site of surgery. Preop antibiotics were given with 60 minutes of beginning the procedure.  Fluoroscopy was used to define the patient's anatomy. A 4 cm incision was made just proximal to the tip of the greater trochanter. The awl was used to obtain the standard starting point for a trochanteric entry nail under fluoroscopic control. The guidepin was placed. The entry reamer was used to open the proximal femur.  On  the back table, the nail was assembled onto the jig. The nail was placed into the femur without any difficulty. Through a separate stab incision, the cannula was placed down to the bone in preparation for the cephalomedullary device. A guidepin was placed into the femoral head using AP and lateral fluoroscopy views. The pin was measured, and then reaming was performed to the appropriate depth. The lag screw was inserted to the appropriate depth. The fracture was compressed through the jig. The setscrew was tightened statically. A separate stab incision was created, and the distal interlocking screw was placed using standard AO technique. The jig was removed. Final AP and lateral fluoroscopy views were obtained to confirm fracture reduction and hardware placement. Tip apex distance was appropriate. There was no chondral penetration.  The wounds were copiously irrigated with saline. The wound was closed in layers with #1 Vicryl for the fascia, 2-0 Monocryl for the deep dermal layer, and skin staples. Glue was applied to the skin. Once the glue was fully hardened, sterile dressing was applied. The patient was then awakened from anesthesia and taken to the PACU in stable condition. Sponge needle and instrument counts were correct at the end of the case 2. There were no known complications.  We will readmit the patient to the hospitalist. Weightbearing status will be weightbearing as tolerated with a walker. We will begin Lovenox for DVT prophylaxis. The patient will mobilize with physical therapy and undergo disposition planning.  Please note that a surgical assistant was a medical necessity for this procedure to perform it in a safe and expeditious manner. Assistant was necessary to provide appropriate retraction of vital neurovascular structures, to prevent femoral fracture, and to allow for  anatomic placement of the prosthesis.

## 2021-05-29 NOTE — Anesthesia Postprocedure Evaluation (Signed)
Anesthesia Post Note  Patient: Julie Durham  Procedure(s) Performed: INTRAMEDULLARY (IM) NAIL INTERTROCHANTRIC (Right: Hip)     Patient location during evaluation: PACU Anesthesia Type: General Level of consciousness: awake and alert Pain management: pain level controlled Vital Signs Assessment: post-procedure vital signs reviewed and stable Respiratory status: spontaneous breathing, nonlabored ventilation and respiratory function stable Cardiovascular status: blood pressure returned to baseline and stable Postop Assessment: no apparent nausea or vomiting Anesthetic complications: no   No notable events documented.  Last Vitals:  Vitals:   05/29/21 1426 05/29/21 1430  BP: 123/86 (!) 160/94  Pulse: 79 91  Resp: 18 10  Temp: 36.9 C 36.8 C  SpO2: 100% 93%    Last Pain:  Vitals:   05/29/21 1426  TempSrc: Oral  PainSc:                  Lowella Curb

## 2021-05-30 DIAGNOSIS — S72001A Fracture of unspecified part of neck of right femur, initial encounter for closed fracture: Secondary | ICD-10-CM | POA: Diagnosis not present

## 2021-05-30 DIAGNOSIS — D62 Acute posthemorrhagic anemia: Secondary | ICD-10-CM

## 2021-05-30 DIAGNOSIS — R410 Disorientation, unspecified: Secondary | ICD-10-CM

## 2021-05-30 LAB — CBC
HCT: 24.5 % — ABNORMAL LOW (ref 36.0–46.0)
Hemoglobin: 8.1 g/dL — ABNORMAL LOW (ref 12.0–15.0)
MCH: 31 pg (ref 26.0–34.0)
MCHC: 33.1 g/dL (ref 30.0–36.0)
MCV: 93.9 fL (ref 80.0–100.0)
Platelets: 170 10*3/uL (ref 150–400)
RBC: 2.61 MIL/uL — ABNORMAL LOW (ref 3.87–5.11)
RDW: 11.8 % (ref 11.5–15.5)
WBC: 7.6 10*3/uL (ref 4.0–10.5)
nRBC: 0 % (ref 0.0–0.2)

## 2021-05-30 LAB — BASIC METABOLIC PANEL
Anion gap: 7 (ref 5–15)
BUN: 23 mg/dL (ref 8–23)
CO2: 31 mmol/L (ref 22–32)
Calcium: 8.2 mg/dL — ABNORMAL LOW (ref 8.9–10.3)
Chloride: 102 mmol/L (ref 98–111)
Creatinine, Ser: 0.63 mg/dL (ref 0.44–1.00)
GFR, Estimated: >60 mL/min (ref >60)
Glucose, Bld: 157 mg/dL — ABNORMAL HIGH (ref 70–99)
Potassium: 3.2 mmol/L — ABNORMAL LOW (ref 3.5–5.1)
Sodium: 140 mmol/L (ref 135–145)

## 2021-05-30 MED ORDER — POTASSIUM CHLORIDE 10 MEQ/100ML IV SOLN
10.0000 meq | INTRAVENOUS | Status: AC
  Administered 2021-05-30 (×2): 10 meq via INTRAVENOUS
  Filled 2021-05-30 (×2): qty 100

## 2021-05-30 MED ORDER — OXYCODONE HCL 5 MG PO TABS
5.0000 mg | ORAL_TABLET | ORAL | Status: DC | PRN
  Administered 2021-06-01: 5 mg via ORAL
  Filled 2021-05-30: qty 1

## 2021-05-30 MED ORDER — ACETAMINOPHEN 500 MG PO TABS
1000.0000 mg | ORAL_TABLET | Freq: Three times a day (TID) | ORAL | Status: DC
  Administered 2021-05-30: 1000 mg via ORAL
  Filled 2021-05-30: qty 2

## 2021-05-30 MED ORDER — ACETAMINOPHEN 325 MG PO TABS: 650.0000 mg | ORAL_TABLET | Freq: Four times a day (QID) | ORAL | Status: DC | PRN

## 2021-05-30 MED ORDER — POTASSIUM CHLORIDE CRYS ER 20 MEQ PO TBCR
40.0000 meq | EXTENDED_RELEASE_TABLET | Freq: Once | ORAL | Status: AC
  Administered 2021-05-30: 40 meq via ORAL
  Filled 2021-05-30: qty 2

## 2021-05-30 MED ORDER — OXYCODONE HCL 5 MG PO TABS: 2.5000 mg | ORAL_TABLET | ORAL | Status: DC | PRN

## 2021-05-30 MED ORDER — FERROUS SULFATE 325 (65 FE) MG PO TABS
325.0000 mg | ORAL_TABLET | Freq: Every day | ORAL | Status: DC
  Filled 2021-05-30: qty 1

## 2021-05-30 NOTE — Evaluation (Signed)
Occupational Therapy Evaluation Patient Details Name: Julie Durham MRN: [id number] DOB: 12-Sep-1940 Today's Date: 05/30/2021   History of Present Illness Pt is an 81 yo female admitted from [place] where she has lived since 12/2018 after a fall sustaining a R hip fx.  Pt underwent R femur IM nail and is now WBAT.  PMH:  osteoporosis, multiple compression fx, recent fall with R scapular fx, CAD, cardiomyopathy, brain mass.   Clinical Impression   Pt admitted with the above diagnosis and has the deficits listed below. Pt would benefit from cont OT to attempt to increase independence with basic adls and adl transfers back to baseline. At baseline, pt lives at [facility] but does get picked up to visit family on weekends. Pt walks with walker and assists with all adls at baseline and can ambulate household distances as well as manage steps with min assist. Pt is in a lot of pain during evaluation so full eval very limited.  Pt will need to return to SNF and is total care at this point due to pain. Will continue to see to attempt to increase adl independence when pain is under control.       Recommendations for follow up therapy are one component of a multi-disciplinary discharge planning process, led by the attending physician.  Recommendations may be updated based on patient status, additional functional criteria and insurance authorization.   Follow Up Recommendations  Skilled nursing-short term rehab (<3 hours/day)    Assistance Recommended at Discharge Frequent or constant Supervision/Assistance  Patient can return home with the following Two people to help with walking and/or transfers;Two people to help with bathing/dressing/bathroom;Assistance with cooking/housework;Assistance with feeding;Direct supervision/assist for medications management;Direct supervision/assist for financial management;Assist for transportation;Help with stairs or ramp for entrance    Functional Status  Assessment  Patient has had a recent decline in their functional status and demonstrates the ability to make significant improvements in function in a reasonable and predictable amount of time.  Equipment Recommendations  None recommended by OT    Recommendations for Other Services       Precautions / Restrictions Precautions Precautions: Fall Precaution Comments: Pt with two recent falls Restrictions Weight Bearing Restrictions: Yes RLE Weight Bearing: Weight bearing as tolerated Other Position/Activity Restrictions: Pt with R scapular fx 1 week ago      Mobility Bed Mobility Overal bed mobility: Needs Assistance             General bed mobility comments: Unable to tolerate bed mobility at this point    Transfers                   General transfer comment: unable to transfer      Balance Overall balance assessment: History of Falls                                         ADL either performed or assessed with clinical judgement   ADL Overall ADL's : Needs assistance/impaired Eating/Feeding: Total assistance   Grooming: Total assistance   Upper Body Bathing: Total assistance   Lower Body Bathing: Total assistance   Upper Body Dressing : Total assistance   Lower Body Dressing: Total assistance   Toilet Transfer: Total assistance   Toileting- Clothing Manipulation and Hygiene: Total assistance       Functional mobility during ADLs: Total assistance;+2 for physical assistance General ADL Comments:  Pt unable to tolerate adls at this point due to pain.     Vision   Vision Assessment?:  (unable to fully asssess.) Additional Comments: Pt in a great amount of pain and unable to keep eyes open very long to assess.     Perception     Praxis      Pertinent Vitals/Pain Pain Assessment Pain Assessment: 0-10 Pain Score: 10-Worst pain ever Pain Location: R hip (pt states all over) Pain Descriptors / Indicators: Aching, Crying,  Grimacing, Operative site guarding, Moaning Pain Intervention(s): Limited activity within patient's tolerance, Monitored during session, Premedicated before session, Repositioned     Hand Dominance Right   Extremity/Trunk Assessment Upper Extremity Assessment Upper Extremity Assessment: RUE deficits/detail;LUE deficits/detail RUE Deficits / Details: Shoulder ROM appears limited. Pt resisting movement possibly due to scapular fx.  Elbow/wrist and hand appear WFL RUE: Unable to fully assess due to pain RUE Sensation: WNL RUE Coordination: decreased gross motor LUE Deficits / Details: ROM appears WFL.  Difficult to fully assess shoulder movement due to pt laying on L side and not being able to move much due to extreme pain. LUE: Unable to fully assess due to pain LUE Sensation: WNL LUE Coordination: WNL   Lower Extremity Assessment Lower Extremity Assessment: Defer to PT evaluation   Cervical / Trunk Assessment Cervical / Trunk Assessment: Kyphotic   Communication Communication Communication: No difficulties   Cognition Arousal/Alertness: Awake/alert Behavior During Therapy: Restless Overall Cognitive Status: Difficult to assess                                 General Comments: Son states pt has memory deficits and is not always oriented to date and time but knows familar people in her life, where she is etc.  Son takes pt out of SNF on weekends for family visits.     General Comments  Pt limited by pain.    Exercises     Shoulder Instructions      Home Living Family/patient expects to be discharged to:: Skilled nursing facility                                 Additional Comments: Pt has lived at [facility] for [facility] years      Prior Functioning/Environment Prior Level of Function : Needs assist;History of Falls (last six months)       Physical Assist : Mobility (physical);ADLs (physical) Mobility (physical): Stairs ADLs (physical):  IADLs Mobility Comments: Pt was walking with walker PTA with distand supervision. Managed steps with min assist. Pt has w/c in SNF for long distances. ADLs Comments: Pt sponge bathed self and could dress and toilet with use of walker.  Pt required assist with meal management/medicine.        OT Problem List: Decreased range of motion;Decreased activity tolerance;Impaired balance (sitting and/or standing);Decreased cognition;Decreased knowledge of use of DME or AE;Decreased knowledge of precautions;Impaired UE functional use;Pain      OT Treatment/Interventions: Self-care/ADL training;Therapeutic activities;DME and/or AE instruction;Balance training    OT Goals(Current goals can be found in the care plan section) Acute Rehab OT Goals Patient Stated Goal: none stated OT Goal Formulation: With family Time For Goal Achievement: 06/13/21 Potential to Achieve Goals: Fair ADL Goals Pt Will Perform Eating: with set-up;sitting Pt Will Perform Grooming: with set-up;sitting Pt Will Perform Lower Body Bathing: with mod assist;sit to/from  stand Pt Will Perform Lower Body Dressing: with mod assist;sit to/from stand Pt Will Transfer to Toilet: with mod assist;stand pivot transfer;bedside commode;regular height toilet  OT Frequency: Min 2X/week    Co-evaluation PT/OT/SLP Co-Evaluation/Treatment: Yes Reason for Co-Treatment: Complexity of the patient's impairments (multi-system involvement) PT goals addressed during session: Mobility/safety with mobility OT goals addressed during session: ADL's and self-care      AM-PAC OT "6 Clicks" Daily Activity     Outcome Measure Help from another person eating meals?: Total Help from another person taking care of personal grooming?: Total Help from another person toileting, which includes using toliet, bedpan, or urinal?: Total Help from another person bathing (including washing, rinsing, drying)?: Total Help from another person to put on and taking off  regular upper body clothing?: Total Help from another person to put on and taking off regular lower body clothing?: Total 6 Click Score: 6   End of Session Nurse Communication: Mobility status  Activity Tolerance: Patient limited by pain Patient left: in bed;with call bell/phone within reach;with bed alarm set  OT Visit Diagnosis: Pain;Other abnormalities of gait and mobility (R26.89) Pain - Right/Left: Right Pain - part of body: Leg                Time: [id number] OT Time Calculation (min): 20 min Charges:  OT General Charges $OT Visit: 1 Visit OT Evaluation $OT Eval Moderate Complexity: 1 Mod  Glenford Peers 05/30/2021, 9:29 AM

## 2021-05-30 NOTE — Assessment & Plan Note (Addendum)
Likely postop delirium in patient with advanced dementia.  No improvement despite interventions.  Transfer to residential hospice for end-of-life care.

## 2021-05-30 NOTE — Progress Notes (Signed)
PROGRESS NOTE    [name]  [name] DOB: Jan 27, 1940 [name]: 05/27/2021 PCP: Pcp, No  Chief Complaint  Patient presents with   Fall    Brief Narrative:  Julie Durham is Julie Durham 81 y.o. female with medical history significant of osteoporosis with compression fractures, CAD, takutsubo's cardiomyopathy, hypertension and multiple other medical problems presenting after [name] fall.    Assessment & Plan:   Principal Problem:   Hip fracture ([name]) Active Problems:   Fall against object   Closed right scapular fracture   Delirium   Chest pain   Dementia ([name])   Frailty   Essential hypertension   History of cardiomyopathy   Acute postoperative anemia due to expected blood loss   History of CAD (coronary artery disease)   Infestation by bed bug   DNR (do not resuscitate)   Osteoporosis   Protein-calorie malnutrition, severe   Bladder distention   Assessment and Plan: * Hip fracture ([name]) Plain films with displaced R femoral neck fracture CT with comminuted intertrochanteric fx of the proximal R femor with varus angulation, moderate compression of the L5 vertebral body which appears acute or subacute, partially visualized L3 compression fx, age indeterminate, remote healed right superior and inferior pubic rami fx Ortho c/s, appreciate assistance WBAT with walker as able Pain management, bowel regimen 5/21 IM fixation right femur Post op DVT ppx per ortho - lovenox Was walking with walker prior to her fall, has been intermittently wheelchair bound at times in the past as well Likely needs SNF at discharge  Delirium Post op delirium Continue pain management, delirium precautions   Closed right scapular fracture Larey Seat out of wheelchair about 1 week prior to admission with shoulder pain Plain film with angulated fx at inferior aspect of R scapula Per orthopedics - sling when up, WBAT -> needs f/u with upper extremity physician outpatient  Fall against object [facility]  hitting hip on table and on ground She's complaining about back pain in the room, but holding her R hip (suspect this is due to R hip fx).   Head CT without acute abnormalities L spine with multilevel compression deformities noted within lumbar spine, prior vertebral augmentation.  Limited exam by severe osteoporosis.  T spine limited due to osteopenia, consider thoracic CT Moderate to severe L3 and L5 compression fx, prior augmentation to L1 and L4.   Severe osteopenia with chronic thoracic compression fractures at 7 vertebral levels.  Moderate T5 and severe T7 compression fx new since 2020, but age indeterminate otherwise.    Frailty Body mass index is 13.13 kg/m.  Dietitian c/s  Dementia [facility]) Delirium precautions  Chest pain Limited hx with dementia Low suspicion cardiac, r/o ACS Difficult with her dementia and uncontrolled pain Follow EKG and troponin (negative troponin x2)   Acute postoperative anemia due to expected blood loss Iron Follow Hb  History of cardiomyopathy Echo 05/2019 with EF 60-65%, grade 1 diastolic dysfunction Not overloaded on exam todaay  Essential hypertension Metoprolol Significantly elevated -> treat pain Will dose arb today, hold on day of surgery   History of CAD (coronary artery disease) LHC 10/2018 with mild nonobstructive CAD, mderate segmental LV systolic dysfunction in periapical pattern likely secondary to takotsubo syndrome Continue metoprolol Resume aspirin post op Continue crestor  Infestation by bed bug Bed bugs reported on arrival, none noted by EDP Will continue contact precautions for now and monitor closely  Osteoporosis Hx compression fx with prior vertebral augmentation Needs outpatient treatment  DNR (do not resuscitate)  Noted on most form (not completely filled out - see media) and confirmed with son  Protein-calorie malnutrition, severe RD  Bladder distention Needs TOV after surgery       DVT prophylaxis:  SCD Code Status: DNR Family Communication: son Disposition:   Status is: Inpatient Remains inpatient appropriate because: need for ortho intervention   Consultants:  orthopedics  Procedures:  none  Antimicrobials:  Anti-infectives (From admission, onward)    Start     Dose/Rate Route Frequency Ordered Stop   05/29/21 2200  ceFAZolin (ANCEF) IVPB 2g/100 mL premix        2 g 200 mL/hr over 30 Minutes Intravenous Every 12 hours 05/29/21 1456 05/29/21 2150   05/29/21 1150  ceFAZolin (ANCEF) 2-4 GM/100ML-% IVPB       Note to Pharmacy: Dara Lords M: cabinet override      05/29/21 1150 05/29/21 1316   05/29/21 0900  ceFAZolin (ANCEF) IVPB 2g/100 mL premix        2 g 200 mL/hr over 30 Minutes Intravenous On call to O.R. 05/29/21 0803 05/29/21 1317       Subjective: Confused, moaning  Objective: Vitals:   05/30/21 0055 05/30/21 0525 05/30/21 0921 05/30/21 1256  BP: (!) 164/94 (!) 174/99 (!) 171/91 (!) 169/100  Pulse: 94 (!) 102 (!) 109 99  Resp: 16 17 18  (!) 22  Temp: 98.1 F (36.7 C) 98.1 F (36.7 C) 98.4 F (36.9 C) 98.5 F (36.9 C)  TempSrc: Oral  Oral Oral  SpO2: 92% 96% 97% 91%  Weight:      Height:        Intake/Output Summary (Last 24 hours) at 05/30/2021 1633 Last data filed at 05/30/2021 0700 Gross per 24 hour  Intake 986.91 ml  Output 300 ml  Net 686.91 ml   Filed Weights   05/27/21 1426  Weight: 29.5 kg    Examination:  General: No acute distress. Cardiovascular: RRR Lungs:  unlabored Abdomen: Soft, nontender, nondistended Neurological: delirious, moaning in pain. Moves all extremities 4 . Cranial nerves II through XII grossly intact. Extremities: dressing to RLE  Data Reviewed: I have personally reviewed following labs and imaging studies  CBC: Recent Labs  Lab 05/27/21 1431 05/28/21 0405 05/29/21 0641 05/30/21 0344  WBC 5.8 9.1 11.7* 7.6  NEUTROABS 4.5  --  10.1*  --   HGB 12.6 10.1* 10.0* 8.1*  HCT 37.6 30.3* 31.4* 24.5*  MCV  94.2 95.3 95.4 93.9  PLT 269 203 230 [id number]    Basic Metabolic Panel: Recent Labs  Lab 05/27/21 1431 05/28/21 0405 05/29/21 0542 05/30/21 0344  NA 140 140 141 140  K 3.2* 3.5 3.8 3.2*  CL 105 105 104 102  CO2 28 30 29 31   GLUCOSE 143* 127* 130* 157*  BUN 16 19 23 23   CREATININE 0.51 0.51 0.59 0.63  CALCIUM 8.3* 8.4* 8.7* 8.2*  MG  --   --  2.3  --   PHOS  --   --  2.9  --     GFR: Estimated Creatinine Clearance: 25.7 mL/min (by C-G formula based on SCr of 0.63 mg/dL).  Liver Function Tests: Recent Labs  Lab 05/27/21 1431 05/28/21 0405 05/29/21 0542  AST 19 57* 37  ALT 17 35 30  ALKPHOS 98 90 89  BILITOT 0.5 0.7 1.0  PROT 6.3* 5.5* 6.3*  ALBUMIN 3.6 3.0* 3.3*    CBG: No results for input(s): GLUCAP in the last 168 hours.   Recent Results (from the past 240  hour(s))  MRSA Next Gen by PCR, Nasal     Status: None   Collection Time: 05/27/21 11:57 PM   Specimen: Nasal Mucosa; Nasal Swab  Result Value Ref Range Status   MRSA by PCR Next Gen NOT DETECTED NOT DETECTED Final    Comment: (NOTE) The GeneXpert MRSA Assay (FDA approved for NASAL specimens only), is one component of [facility] comprehensive MRSA colonization surveillance program. It is not intended to diagnose MRSA infection nor to guide or monitor treatment for MRSA infections. Test performance is not FDA approved in patients less than 80 years old. Performed at [facility], [place] [address] [place], [place] [address]   Surgical pcr screen     Status: None   Collection Time: 05/29/21  8:37 AM   Specimen: Nasal Mucosa; Nasal Swab  Result Value Ref Range Status   MRSA, PCR NEGATIVE NEGATIVE Final   Staphylococcus aureus NEGATIVE NEGATIVE Final    Comment: (NOTE) The Xpert SA Assay (FDA approved for NASAL specimens in patients 18 years of age and older), is one component of [facility] comprehensive surveillance program. It is not intended to diagnose infection nor to guide or monitor  treatment. Performed at [facility], [place] [address], [place], [place] [address]          Radiology Studies: CT THORACIC SPINE WO CONTRAST  Result Date: 05/29/2021 CLINICAL DATA:  81 year old female status post fall. Hip fracture. EXAM: CT THORACIC SPINE WITHOUT CONTRAST TECHNIQUE: Multidetector CT images of the thoracic were obtained using the standard protocol without intravenous contrast. RADIATION DOSE REDUCTION: This exam was performed according to the departmental dose-optimization program which includes automated exposure control, adjustment of the mA and/or kV according to patient size and/or use of iterative reconstruction technique. COMPARISON:  CTA chest 12/26/2018. FINDINGS: Limited cervical spine imaging: Cervicothoracic junction alignment is within normal limits. Thoracic spine segmentation:  Normal. Alignment: Chronic but increased thoracic kyphosis compared to 12/26/2018. No significant thoracic spondylolisthesis. Vertebrae: Chronic severe osteopenia. Chronic T3 vertebra plana. Chronic T6, and T8 through T11 compression fractures appear stable since 2020. Mild T12 inferior endplate compression also chronic and stable. T1 and T2 appears stable and intact. T4 appears stable and intact. Moderate new T5 compression fractures since 12/26/2018. No retropulsion. T5 posterior elements appear to remain intact. Severe T7 compression fracture is new since 12/26/2018. No significant retropulsion. T7 posterior elements appears stable and intact. Rib osteopenia and mild respiratory motion. No obvious acute posterior rib fracture. Paraspinal and other soft tissues: Lungs appear hyperinflated. Visible major airways are patent. No pericardial or pleural effusion is evident. Calcified aortic atherosclerosis. Thoracic paraspinal soft tissues remain within normal limits. Disc levels: Despite the extensive thoracic compression fractures, the thoracic spinal canal is capacious. No CT  evidence of thoracic spinal stenosis. IMPRESSION: 1. Severe osteopenia with chronic thoracic compression fractures at 7 vertebral levels (including chronic vertebral plana at T3). Moderate T5 and Severe T7 compression fractures are new since 12/26/2018 but otherwise age indeterminate. No retropulsion or complicating features. 2. No CT evidence of thoracic spinal stenosis. 3. Aortic Atherosclerosis (ICD10-I70.0) and Emphysema (ICD10-J43.9). Electronically Signed   By: Genevie Ann M.D.   On: 05/29/2021 05:46   CT LUMBAR SPINE WO CONTRAST  Result Date: 05/29/2021 CLINICAL DATA:  81 year old female status post fall. Hip fracture. EXAM: CT LUMBAR SPINE WITHOUT CONTRAST TECHNIQUE: Multidetector CT imaging of the lumbar spine was performed without intravenous contrast administration. Multiplanar CT image reconstructions were also generated. RADIATION DOSE  REDUCTION: This exam was performed according to the departmental dose-optimization program which includes automated exposure control, adjustment of the mA and/or kV according to patient size and/or use of iterative reconstruction technique. COMPARISON:  Thoracic spine CT today reported separately. Lumbar MRI 09/15/2018. FINDINGS: Segmentation: Normal, concordant with the thoracic spine numbering today. Alignment: Straightening of lumbar lordosis has not significantly changed since 2020. Vertebrae: Diffuse severe osteopenia. Thoracic levels are reported separately today. Chronically augmented L1 and L4 compression fractures. Chronic severe L2 compression fracture is stable from the 2020 MRI. New moderate to severe L3 and L5 compression fractures since that time. Mild retropulsion of bone at both levels. Pedicles and posterior elements of both levels appear to remain intact. Grossly intact visible sacrum and SI joints. Paraspinal and other soft tissues: Partially visible urinary bladder distension appears moderate to severe (series 8, image 90). Aortoiliac calcified  atherosclerosis. Cholecystectomy clips. Lumbar paraspinal soft tissues remain within normal limits. Disc levels: Underlying capacious spinal canal is demonstrated on the 2020 MRI. Despite the diffuse lumbar compression fractures and some associated retropulsion no significant lumbar spinal stenosis is suspected. IMPRESSION: 1. Diffuse severe osteopenia. Moderate to severe L3 and L5 compression fractures are new since 10/05/2018, otherwise age indeterminate. Mild retropulsion but no significant lumbar spinal stenosis by CT. 2. Other lumbar levels are chronically compressed. L1 and L4 previously augmented. 3. Partially visible distended urinary bladder. Query Urinary Retention and recommend Foley catheter decompression. 4. Thoracic Spine reported separately today. Aortic Atherosclerosis (ICD10-I70.0). Electronically Signed   By: Genevie Ann M.D.   On: 05/29/2021 05:51   Pelvis Portable  Result Date: 05/29/2021 CLINICAL DATA:  ORIF RIGHT hip fracture. EXAM: PORTABLE PELVIS 1-2 VIEWS COMPARISON:  05/28/2021 CT and prior studies FINDINGS: IM nail/screw is noted within the proximal RIGHT femur traversing an intertrochanteric fracture, in near-anatomic alignment and position. No definite complicating features are noted. IMPRESSION: ORIF RIGHT femur fracture, in near-anatomic alignment and position. Electronically Signed   By: Margarette Canada M.D.   On: 05/29/2021 14:30   DG C-Arm 1-60 Min-No Report  Result Date: 05/29/2021 Fluoroscopy was utilized by the requesting physician.  No radiographic interpretation.   DG HIP UNILAT WITH PELVIS 2-3 VIEWS RIGHT  Result Date: 05/29/2021 CLINICAL DATA:  ORIF RIGHT femur fracture. EXAM: DG HIP (WITH OR WITHOUT PELVIS) 2V RIGHT COMPARISON:  None Available. FINDINGS: Intraoperative spot views of the RIGHT hip are submitted postoperatively for interpretation. Intramedullary nail and screw noted traversing an intertrochanteric RIGHT femur fracture. No gross complicating features are  identified. IMPRESSION: ORIF RIGHT femur fracture. Electronically Signed   By: Margarette Canada M.D.   On: 05/29/2021 14:31        Scheduled Meds:  acetaminophen  1,000 mg Oral Q8H   docusate sodium  100 mg Oral BID   feeding supplement  237 mL Oral TID BM   [START ON 05/31/2021] ferrous sulfate  325 mg Oral Q breakfast   gabapentin  100 mg Oral QHS   heparin injection (subcutaneous)  5,000 Units Subcutaneous Q12H   irbesartan  150 mg Oral Daily   lidocaine  1 patch Transdermal Q24H   metoprolol tartrate  25 mg Oral BID   multivitamin with minerals  1 tablet Oral Daily   pantoprazole  40 mg Oral Daily   rosuvastatin  5 mg Oral Daily   Continuous Infusions:  dextrose 5% lactated ringers 50 mL/hr at 05/30/21 0700     LOS: 3 days    Time spent: over 30 min  Fayrene Helper, MD [facility]   To contact the attending provider between 7A-7P or the covering provider during after hours 7P-7A, please log into the web site [email or web address] and access using universal [place] password for that web site. If you do not have the password, please call the hospital operator.  05/30/2021, 4:33 PM

## 2021-05-30 NOTE — Progress Notes (Signed)
Nutrition Follow-up  DOCUMENTATION CODES:   Severe malnutrition in context of chronic illness, Underweight  INTERVENTION:   -Ensure Plus High Protein po BID, each supplement provides 350 kcal and 20 grams of protein.   -Multivitamin with minerals daily  NEW NUTRITION DIAGNOSIS:   Severe Malnutrition related to chronic illness (dementia) as evidenced by severe fat depletion, severe muscle depletion.  GOAL:   Patient will meet greater than or equal to 90% of their needs  Not meeting.  MONITOR:   PO intake, Supplement acceptance, Labs, Weight trends, I & O's  REASON FOR ASSESSMENT:   Consult Assessment of nutrition requirement/status  ASSESSMENT:   81 y.o. female with medical history significant of osteoporosis with compression fractures, CAD, takutsubo's cardiomyopathy, hypertension and multiple other medical problems presenting after a fall. Admitted for right hip fracture.  5/21: s/p IM fixation of right hip  Patient in room, no family present. Not able to provide any history given dementia. Moans in response to most questions. Mitts over hands.  Was able to confirm severe malnutrition diagnosis.   Will continue Ensure supplements. No PO documented. 1/2 a bottle of Ensure sitting at bedside.   Admission weight: 65 lbs.  Still needs updated weight if feasible.   Medications: Colace, Multivitamin with minerals daily, KLOR-CON, D5 infusion, KCl  Labs reviewed: Low K   NUTRITION - FOCUSED PHYSICAL EXAM:  Flowsheet Row Most Recent Value  Orbital Region Severe depletion  Upper Arm Region Severe depletion  Thoracic and Lumbar Region Severe depletion  Buccal Region Severe depletion  Temple Region Severe depletion  Clavicle Bone Region Severe depletion  Clavicle and Acromion Bone Region Severe depletion  Scapular Bone Region Severe depletion  Dorsal Hand Unable to assess  [mittens]  Patellar Region Severe depletion  Anterior Thigh Region Severe depletion   Posterior Calf Region Severe depletion  Edema (RD Assessment) None  Hair Reviewed  Eyes Reviewed  Mouth Reviewed  [missing teeth] peeling lips  Skin Reviewed  Nails Unable to assess       Diet Order:   Diet Order             Diet regular Room service appropriate? Yes; Fluid consistency: Thin  Diet effective now                   EDUCATION NEEDS:   Not appropriate for education at this time  Skin:  Skin Assessment: Skin Integrity Issues: Skin Integrity Issues:: Incisions Incisions: 5/21 right hip  Last BM:  PTA  Height:   Ht Readings from Last 1 Encounters:  05/27/21 4\' 11"  (1.499 m)    Weight:   Wt Readings from Last 1 Encounters:  05/27/21 29.5 kg    BMI:  Body mass index is 13.13 kg/m.  Estimated Nutritional Needs:   Kcal:  1450-1650  Protein:  65-80g  Fluid:  1.6L/day  05/29/21, MS, RD, LDN Inpatient Clinical Dietitian Contact information available via Amion

## 2021-05-30 NOTE — Progress Notes (Signed)
    Subjective:  Patient has dementia at baseline. Patient not responding to questions. Patient reporting pain even with slight touch everywhere. Cannot assess N/V/CP/SOB. Per daughter and nurse no vomiting, nausea, chest pain or SOB reported or witnessed.  Objective:   VITALS:   Vitals:   05/30/21 0055 05/30/21 0525 05/30/21 0921 05/30/21 1256  BP: (!) 164/94 (!) 174/99 (!) 171/91 (!) 169/100  Pulse: 94 (!) 102 (!) 109 99  Resp: 16 17 18  (!) 22  Temp: 98.1 F (36.7 C) 98.1 F (36.7 C) 98.4 F (36.9 C) 98.5 F (36.9 C)  TempSrc: Oral  Oral Oral  SpO2: 92% 96% 97% 91%  Weight:      Height:        Patient lying in bed. Mittens on. Does not appear to be in acute distress. Daughter at bedside. NAD. She has dementia at baseline.  Abdomen soft.  Neurovascularly intact Sensation intact distally, patient reports pain even with light touch everywhere.  Distal pulses intact bilaterally. Patient no responding to command to move feet but did on her on.  Incision C/D/I, aquacel dressing intact.  No cellulitis present.  Compartments soft.    Lab Results  Component Value Date   WBC 7.6 05/30/2021   HGB 8.1 (L) 05/30/2021   HCT 24.5 (L) 05/30/2021   MCV 93.9 05/30/2021   PLT 170 05/30/2021   BMET    Component Value Date/Time   NA 140 05/30/2021 0344   NA 142 10/21/2018 1458   K 3.2 (L) 05/30/2021 0344   CL 102 05/30/2021 0344   CO2 31 05/30/2021 0344   GLUCOSE 157 (H) 05/30/2021 0344   BUN 23 05/30/2021 0344   BUN 16 10/21/2018 1458   CREATININE 0.63 05/30/2021 0344   CREATININE 0.65 04/13/2012 1148   CALCIUM 8.2 (L) 05/30/2021 0344   GFRNONAA >60 05/30/2021 0344     Assessment/Plan: 1 Day Post-Op   Principal Problem:   Hip fracture ([name]) Active Problems:   Essential hypertension   Chest pain   Protein-calorie malnutrition, severe   Fall against object   Dementia ([name])   History of cardiomyopathy   History of CAD (coronary artery disease)   Infestation by  bed bug   DNR (do not resuscitate)   Frailty   Closed right scapular fracture   Osteoporosis   Bladder distention  S/p IM nail from right peritrochanteric femur fracture.   WBAT with walker as able  DVT ppx: Lovenox, SCDs, TEDS PO pain control PT/OT: PT/OT limited today due to pain. Continue to work with therapy. Patient walker/wheelchair at baseline.  Dispo: Likely d/c to SNF. Patient at [facility] before fall. Hospitalist for medial management.     [facility] 05/30/2021, 2:12 PM   06/01/2021, MD [phone] [facility] is now [facility]  [place] [address], [place], [place] Phone: [phone] [email or web address] Facebook  [phone]

## 2021-05-30 NOTE — Evaluation (Signed)
Physical Therapy Evaluation Patient Details Name: Julie Durham MRN: [id number] DOB: 07/04/1940 Today's Date: 05/30/2021  History of Present Illness  Julie Durham is a 81 y.o. female presenting to the ED from [place] on 5/19 after a fall. Pt found to have Rt femoral neck fracture now s/p ORIF for IM nail. CT also revealed moderate compression of the acute or subacute L5 vertebral body, age indeterminate L3 compression fx, remote healed right pubic rami fx. PMH significant forsSevere osteoporosis with chronic thoracic compression fractures at 7 vertebral levels, HTN, CAD, OA, takotsubo syndrome.   Clinical Impression  Julie Durham is a 81 y.o. female POD 1 s/p Rt IM nail secondary to femoral neck fracture. Patient is now limited by functional impairments (see PT problem list below) and requires Total Assist for repositioning in bed and due to severe pain was unable to progress to transfers and gait this date. At baseline per pt's son, she is ambulatory for short distances with RW, uses wheelchair for long distances, and is able to complete steps with min assist. Pt's pain and confusion limited evaluation this date. Patient will benefit from continued skilled PT interventions to address impairments and progress towards PLOF. Acute PT will follow to progress mobility, recommend ST rehab at SNF then return to [facility] [place].    Recommendations for follow up therapy are one component of a multi-disciplinary discharge planning process, led by the attending physician.  Recommendations may be updated based on patient status, additional functional criteria and insurance authorization.  Follow Up Recommendations Skilled nursing-short term rehab (<3 hours/day)    Assistance Recommended at Discharge Frequent or constant Supervision/Assistance  Patient can return home with the following  Two people to help with walking and/or transfers;Two people to help with  bathing/dressing/bathroom;Direct supervision/assist for medications management;Assistance with feeding;Assistance with cooking/housework;Direct supervision/assist for financial management;Assist for transportation;Help with stairs or ramp for entrance    Equipment Recommendations None recommended by PT  Recommendations for Other Services       Functional Status Assessment Patient has had a recent decline in their functional status and/or demonstrates limited ability to make significant improvements in function in a reasonable and predictable amount of time     Precautions / Restrictions Precautions Precautions: Fall Precaution Comments: Pt with two recent falls Restrictions Weight Bearing Restrictions: Yes RLE Weight Bearing: Weight bearing as tolerated Other Position/Activity Restrictions: Pt with R scapular fx 1 week ago      Mobility  Bed Mobility Overal bed mobility: Needs Assistance             General bed mobility comments: Total Assist for adjusting in bed, pt in fetal position at start. pt unable to tolerate mobility beyond attempts to adjust.    Transfers                   General transfer comment: unable to transfer    Ambulation/Gait                  Stairs            Wheelchair Mobility    Modified Rankin (Stroke Patients Only)       Balance Overall balance assessment: History of Falls                                           Pertinent Vitals/Pain Pain Assessment Pain  Assessment: Faces Pain Score: 10-Worst pain ever Faces Pain Scale: Hurts a little bit Breathing: occasional labored breathing, short period of hyperventilation Negative Vocalization: repeated troubled calling out, loud moaning/groaning, crying Facial Expression: facial grimacing Body Language: tense, distressed pacing, fidgeting Consolability: distracted or reassured by voice/touch PAINAD Score: 7 Pain Location: R hip (pt states all  over) Pain Descriptors / Indicators: Aching, Grimacing, Operative site guarding, Moaning Pain Intervention(s): Limited activity within patient's tolerance, Monitored during session, Repositioned    Home Living Family/patient expects to be discharged to:: Skilled nursing facility                   Additional Comments: Pt has lived at [facility] for 2 years    Prior Function Prior Level of Function : Needs assist;History of Falls (last six months)       Physical Assist : Mobility (physical);ADLs (physical) Mobility (physical): Stairs ADLs (physical): IADLs Mobility Comments: Pt was walking with walker PTA with distand supervision. Managed steps with min assist. Pt has w/c in SNF for long distances. ADLs Comments: Pt sponge bathed self and could dress and toilet with use of walker.  Pt required assist with meal management/medicine.     Hand Dominance   Dominant Hand: Right    Extremity/Trunk Assessment   Upper Extremity Assessment Upper Extremity Assessment: Defer to OT evaluation RUE Deficits / Details: Shoulder ROM appears limited. Pt resisting movement possibly due to scapular fx.  Elbow/wrist and hand appear WFL RUE: Unable to fully assess due to pain RUE Sensation: WNL RUE Coordination: decreased gross motor LUE Deficits / Details: ROM appears WFL.  Difficult to fully assess shoulder movement due to pt laying on L side and not being able to move much due to extreme pain. LUE: Unable to fully assess due to pain LUE Sensation: WNL LUE Coordination: WNL    Lower Extremity Assessment Lower Extremity Assessment: RLE deficits/detail;LLE deficits/detail RLE Deficits / Details: Limited by severe pain, RLE: Unable to fully assess due to pain LLE Deficits / Details: Lt LE extended under Rt, able to move through flex/ext with PROM and pt assisting some. unable to achieve full extension. LLE: Unable to fully assess due to pain    Cervical / Trunk Assessment Cervical /  Trunk Assessment: Kyphotic  Communication   Communication: No difficulties  Cognition Arousal/Alertness: Awake/alert Behavior During Therapy: Restless Overall Cognitive Status: Difficult to assess                                 General Comments: Son states pt has memory deficits and is not always oriented to date and time but knows familar people in her life, where she is etc.  Son takes pt out of SNF on weekends for family visits.        General Comments General comments (skin integrity, edema, etc.): limited by severe pain    Exercises     Assessment/Plan    PT Assessment Patient needs continued PT services  PT Problem List Decreased strength;Decreased range of motion;Decreased activity tolerance;Decreased balance;Decreased mobility;Decreased cognition;Decreased coordination;Decreased knowledge of use of DME;Decreased safety awareness;Decreased knowledge of precautions;Pain       PT Treatment Interventions DME instruction;Gait training;Stair training;Functional mobility training;Therapeutic activities;Balance training;Therapeutic exercise;Neuromuscular re-education;Patient/family education;Cognitive remediation;Wheelchair mobility training    PT Goals (Current goals can be found in the Care Plan section)  Acute Rehab PT Goals Patient Stated Goal: stop hurting PT Goal Formulation: With patient/family Time  For Goal Achievement: 06/13/21 Potential to Achieve Goals: Fair    Frequency Min 2X/week     Co-evaluation PT/OT/SLP Co-Evaluation/Treatment: Yes Reason for Co-Treatment: For patient/therapist safety;Complexity of the patient's impairments (multi-system involvement) PT goals addressed during session: Mobility/safety with mobility OT goals addressed during session: ADL's and self-care       AM-PAC PT "6 Clicks" Mobility  Outcome Measure Help needed turning from your back to your side while in a flat bed without using bedrails?: Total Help needed moving  from lying on your back to sitting on the side of a flat bed without using bedrails?: Total Help needed moving to and from a bed to a chair (including a wheelchair)?: Total Help needed standing up from a chair using your arms (e.g., wheelchair or bedside chair)?: Total Help needed to walk in hospital room?: Total Help needed climbing 3-5 steps with a railing? : Total 6 Click Score: 6    End of Session   Activity Tolerance: Patient limited by pain Patient left: in bed;with bed alarm set;with call bell/phone within reach Nurse Communication: Mobility status PT Visit Diagnosis: Unsteadiness on feet (R26.81);Muscle weakness (generalized) (M62.81);Other abnormalities of gait and mobility (R26.89);Pain;History of falling (Z91.81);Difficulty in walking, not elsewhere classified (R26.2) Pain - Right/Left: Right Pain - part of body: Hip;Shoulder;Leg (generalized pain)    Time: 1610-9604 PT Time Calculation (min) (ACUTE ONLY): 15 min   Charges:   PT Evaluation $PT Eval Moderate Complexity: 1 Mod          Wynn Maudlin, DPT Acute Rehabilitation Services Office [phone] Pager [phone]  05/30/21 11:32 AM

## 2021-05-30 NOTE — Assessment & Plan Note (Addendum)
Stable after 1 unit.

## 2021-05-31 ENCOUNTER — Inpatient Hospital Stay (HOSPITAL_COMMUNITY): Payer: Medicare (Managed Care)

## 2021-05-31 ENCOUNTER — Encounter (HOSPITAL_COMMUNITY): Payer: Self-pay | Admitting: Orthopedic Surgery

## 2021-05-31 DIAGNOSIS — S72001A Fracture of unspecified part of neck of right femur, initial encounter for closed fracture: Secondary | ICD-10-CM | POA: Diagnosis not present

## 2021-05-31 LAB — CBC WITH DIFFERENTIAL/PLATELET
Abs Immature Granulocytes: 0.04 10*3/uL (ref 0.00–0.07)
Basophils Absolute: 0 10*3/uL (ref 0.0–0.1)
Basophils Relative: 0 %
Eosinophils Absolute: 0 10*3/uL (ref 0.0–0.5)
Eosinophils Relative: 0 %
HCT: 23.2 % — ABNORMAL LOW (ref 36.0–46.0)
Hemoglobin: 7.7 g/dL — ABNORMAL LOW (ref 12.0–15.0)
Immature Granulocytes: 1 %
Lymphocytes Relative: 13 %
Lymphs Abs: 1 10*3/uL (ref 0.7–4.0)
MCH: 31.7 pg (ref 26.0–34.0)
MCHC: 33.2 g/dL (ref 30.0–36.0)
MCV: 95.5 fL (ref 80.0–100.0)
Monocytes Absolute: 0.6 10*3/uL (ref 0.1–1.0)
Monocytes Relative: 7 %
Neutro Abs: 6 10*3/uL (ref 1.7–7.7)
Neutrophils Relative %: 79 %
Platelets: 196 10*3/uL (ref 150–400)
RBC: 2.43 MIL/uL — ABNORMAL LOW (ref 3.87–5.11)
RDW: 11.9 % (ref 11.5–15.5)
WBC: 7.7 10*3/uL (ref 4.0–10.5)
nRBC: 0 % (ref 0.0–0.2)

## 2021-05-31 LAB — URINALYSIS, ROUTINE W REFLEX MICROSCOPIC
Bacteria, UA: NONE SEEN
Bilirubin Urine: NEGATIVE
Glucose, UA: NEGATIVE mg/dL
Hgb urine dipstick: NEGATIVE
Ketones, ur: NEGATIVE mg/dL
Leukocytes,Ua: NEGATIVE
Nitrite: POSITIVE — AB
Protein, ur: NEGATIVE mg/dL
Specific Gravity, Urine: 1.012 (ref 1.005–1.030)
pH: 6 (ref 5.0–8.0)

## 2021-05-31 LAB — COMPREHENSIVE METABOLIC PANEL
ALT: 11 U/L (ref 0–44)
AST: 21 U/L (ref 15–41)
Albumin: 2.5 g/dL — ABNORMAL LOW (ref 3.5–5.0)
Alkaline Phosphatase: 66 U/L (ref 38–126)
Anion gap: 6 (ref 5–15)
BUN: 30 mg/dL — ABNORMAL HIGH (ref 8–23)
CO2: 32 mmol/L (ref 22–32)
Calcium: 8.6 mg/dL — ABNORMAL LOW (ref 8.9–10.3)
Chloride: 106 mmol/L (ref 98–111)
Creatinine, Ser: 0.56 mg/dL (ref 0.44–1.00)
GFR, Estimated: >60 mL/min (ref >60)
Glucose, Bld: 128 mg/dL — ABNORMAL HIGH (ref 70–99)
Potassium: 3.7 mmol/L (ref 3.5–5.1)
Sodium: 144 mmol/L (ref 135–145)
Total Bilirubin: 0.4 mg/dL (ref 0.3–1.2)
Total Protein: 5.2 g/dL — ABNORMAL LOW (ref 6.5–8.1)

## 2021-05-31 LAB — MAGNESIUM: Magnesium: 2.1 mg/dL (ref 1.7–2.4)

## 2021-05-31 LAB — PHOSPHORUS: Phosphorus: 2.9 mg/dL (ref 2.5–4.6)

## 2021-05-31 MED ORDER — NALOXONE HCL 0.4 MG/ML IJ SOLN: 0.4000 mg | INTRAMUSCULAR | Status: DC | PRN

## 2021-05-31 MED ORDER — ONDANSETRON HCL 4 MG PO TABS: 4.0000 mg | ORAL_TABLET | Freq: Four times a day (QID) | ORAL | Status: DC | PRN

## 2021-05-31 MED ORDER — HYDROMORPHONE HCL 1 MG/ML IJ SOLN
0.5000 mg | INTRAMUSCULAR | Status: DC | PRN
  Administered 2021-05-31 – 2021-06-05 (×26): 1 mg via INTRAVENOUS
  Administered 2021-06-05: 0.5 mg via INTRAVENOUS
  Administered 2021-06-05 – 2021-06-06 (×6): 1 mg via INTRAVENOUS
  Filled 2021-05-31 (×33): qty 1

## 2021-05-31 MED ORDER — CHLORHEXIDINE GLUCONATE CLOTH 2 % EX PADS
6.0000 | MEDICATED_PAD | Freq: Every day | CUTANEOUS | Status: DC
  Administered 2021-05-31 – 2021-06-06 (×7): 6 via TOPICAL

## 2021-05-31 MED ORDER — ONDANSETRON HCL 4 MG/2ML IJ SOLN: 4.0000 mg | Freq: Four times a day (QID) | INTRAMUSCULAR | Status: DC | PRN

## 2021-05-31 MED ORDER — METOPROLOL TARTRATE 5 MG/5ML IV SOLN
2.5000 mg | Freq: Four times a day (QID) | INTRAVENOUS | Status: DC | PRN
  Administered 2021-06-01: 2.5 mg via INTRAVENOUS
  Filled 2021-05-31: qty 5

## 2021-05-31 MED ORDER — SODIUM CHLORIDE 0.9 % IV SOLN
250.0000 mg | Freq: Every day | INTRAVENOUS | Status: AC
  Administered 2021-06-01 – 2021-06-05 (×5): 250 mg via INTRAVENOUS
  Filled 2021-05-31 (×5): qty 2.5

## 2021-05-31 MED ORDER — HYDROCODONE-ACETAMINOPHEN 5-325 MG PO TABS: 1.0000 | ORAL_TABLET | ORAL | 0 refills | Status: DC | PRN

## 2021-05-31 MED ORDER — ENOXAPARIN SODIUM 30 MG/0.3ML IJ SOSY: 30.0000 mg | PREFILLED_SYRINGE | INTRAMUSCULAR | 0 refills | Status: DC

## 2021-05-31 MED ORDER — ONDANSETRON HCL 4 MG/2ML IJ SOLN
4.0000 mg | Freq: Three times a day (TID) | INTRAMUSCULAR | Status: AC
  Administered 2021-05-31 – 2021-06-01 (×3): 4 mg via INTRAVENOUS
  Filled 2021-05-31 (×3): qty 2

## 2021-05-31 MED ORDER — THIAMINE HCL 100 MG/ML IJ SOLN
100.0000 mg | Freq: Every day | INTRAMUSCULAR | Status: DC
  Administered 2021-06-06: 100 mg via INTRAVENOUS
  Filled 2021-05-31: qty 2

## 2021-05-31 MED ORDER — ACETAMINOPHEN 10 MG/ML IV SOLN
1000.0000 mg | Freq: Four times a day (QID) | INTRAVENOUS | Status: AC
Start: 2021-05-31 — End: 2021-06-01
  Administered 2021-05-31 – 2021-06-01 (×4): 1000 mg via INTRAVENOUS
  Filled 2021-05-31 (×4): qty 100

## 2021-05-31 MED ORDER — SODIUM CHLORIDE 0.9 % IV SOLN
1.0000 g | INTRAVENOUS | Status: DC
  Administered 2021-06-01 – 2021-06-03 (×3): 1 g via INTRAVENOUS
  Filled 2021-05-31 (×3): qty 10

## 2021-05-31 MED ORDER — THIAMINE HCL 100 MG/ML IJ SOLN
500.0000 mg | Freq: Three times a day (TID) | INTRAVENOUS | Status: AC
  Administered 2021-05-31 – 2021-06-01 (×3): 500 mg via INTRAVENOUS
  Filled 2021-05-31 (×3): qty 5

## 2021-05-31 NOTE — Progress Notes (Signed)
PROGRESS NOTE    Julie Durham  [id number] DOB: 1940/07/29 [name]: 05/27/2021 PCP: Pcp, No  Chief Complaint  Patient presents with   Fall    Brief Narrative:  Julie Durham is Julie Durham 81 y.o. female with medical history significant of osteoporosis with compression fractures, CAD, takutsubo's cardiomyopathy, hypertension and multiple other medical problems presenting after Julie Durham fall.  She's now s/p IM fixation on 5/21.  Her post op course has been complicated by delirium.    See below for additional details    Assessment & Plan:   Principal Problem:   Hip fracture [name]) Active Problems:   Delirium   Fall against object   Closed right scapular fracture   Dysphagia   Chest pain   Dementia ([name])   Frailty   Essential hypertension   History of cardiomyopathy   Acute postoperative anemia due to expected blood loss   History of CAD (coronary artery disease)   Infestation by bed bug   DNR (do not resuscitate)   Osteoporosis   Protein-calorie malnutrition, severe   Bladder distention   Assessment and Plan: * Hip fracture ([name]) Plain films with displaced R femoral neck fracture CT with comminuted intertrochanteric fx of the proximal R femor with varus angulation, moderate compression of the L5 vertebral body which appears acute or subacute, partially visualized L3 compression fx, age indeterminate, remote healed right superior and inferior pubic rami fx Ortho c/s, appreciate assistance WBAT with walker as able Pain management, bowel regimen 5/21 IM fixation right femur Post op DVT ppx per ortho - lovenox Daughter at bedside today said she was mostly wheelchair bound, but did use walker at times Likely needs SNF at discharge  Delirium Post op delirium Related to pain, dementia, acute hospitalization She's not able to reliably ask for pain/nausea meds, etc right now due to delirium Will schedule tylenol IV for 24 hrs (reeval tmrw) and have dilaudid available prn for  pain Daughter notes c/o nausea, will schedule zofran x 24 hours (reeval tmrw) No retention on bladder scan Follow UA, CXR Continue pain management, delirium precautions.  Will continue to look for and address underlying issues.   She's unable to tolerate PO given delirium, will ask SLP to eval -> discussed with daughter, I'm concerned given her persistent delirium post op esp with her not taking PO, will continue supportive care at this time.     Closed right scapular fracture [name] out of wheelchair about 1 week prior to admission with shoulder pain Plain film with angulated fx at inferior aspect of R scapula Per orthopedics - sling when up, WBAT -> needs f/u with upper extremity physician outpatient  Fall against object [facility] hitting hip on table and on ground She's complaining about back pain in the room, but holding her R hip (suspect this is due to R hip fx).   Head CT without acute abnormalities L spine with multilevel compression deformities noted within lumbar spine, prior vertebral augmentation.  Limited exam by severe osteoporosis.  T spine limited due to osteopenia, consider thoracic CT Moderate to severe L3 and L5 compression fx, prior augmentation to L1 and L4.   Severe osteopenia with chronic thoracic compression fractures at 7 vertebral levels.  Moderate T5 and severe T7 compression fx new since 2020, but age indeterminate otherwise.    Frailty Body mass index is 13.13 kg/m.  Dietitian c/s  Dementia [name]) Delirium precautions  Chest pain Limited hx with dementia Low suspicion cardiac, r/o ACS Difficult with her dementia and  uncontrolled pain Follow EKG and troponin (negative troponin x2)   Dysphagia Related to dysphagia  Acute postoperative anemia due to expected blood loss Iron Follow Hb  History of cardiomyopathy Echo 05/2019 with EF [id number], grade 1 diastolic dysfunction Not overloaded on exam todaay  Essential hypertension Metoprolol Significantly  elevated -> treat pain Will dose arb today, hold on day of surgery   History of CAD (coronary artery disease) LHC 10/2018 with mild nonobstructive CAD, mderate segmental LV systolic dysfunction in periapical pattern likely secondary to takotsubo syndrome Continue metoprolol Resume aspirin post op Continue crestor  Infestation by bed bug Bed bugs reported on arrival, none noted by EDP Will continue contact precautions for now and monitor closely  Osteoporosis Hx compression fx with prior vertebral augmentation Needs outpatient treatment  DNR (do not resuscitate) Noted on most form (not completely filled out - see media) and confirmed with son  Protein-calorie malnutrition, severe RD  Bladder distention Bladder scan appropriate  Will follow        DVT prophylaxis: SCD Code Status: DNR Family Communication: daughter 5/23, spoke with son 5/21 prior to surgery (at that time we discussed concern regarding post op delirium, overall frailty) Disposition:   Status is: Inpatient Remains inpatient appropriate because: need for ortho intervention   Consultants:  orthopedics  Procedures:  none  Antimicrobials:  Anti-infectives (From admission, onward)    Start     Dose/Rate Route Frequency Ordered Stop   05/29/21 2200  ceFAZolin (ANCEF) IVPB 2g/100 mL premix        2 g 200 mL/hr over 30 Minutes Intravenous Every 12 hours 05/29/21 1456 05/29/21 2150   05/29/21 1150  ceFAZolin (ANCEF) 2-4 GM/100ML-% IVPB       Note to Pharmacy: Julie Durham M: cabinet override      05/29/21 1150 05/29/21 1316   05/29/21 0900  ceFAZolin (ANCEF) IVPB 2g/100 mL premix        2 g 200 mL/hr over 30 Minutes Intravenous On call to O.R. 05/29/21 0803 05/29/21 1317       Subjective: Moaning, yelling in pain at times Daughter at bedside - discussed goal to treat symptoms - manage delirium, delirium precautions   Objective: Vitals:   05/30/21 1717 05/30/21 2058 05/31/21 0413 05/31/21 1330   BP: (!) 178/89 (!) 169/95 (!) 164/113 133/90  Pulse: (!) 110 (!) 106 99 (!) 122  Resp:  20 20 20   Temp:  97.9 F (36.6 C) 98.4 F (36.9 C) 98.5 F (36.9 C)  TempSrc:  Oral Axillary Oral  SpO2:  95% 95% 99%  Weight:      Height:        Intake/Output Summary (Last 24 hours) at 05/31/2021 1700 Last data filed at 05/31/2021 1330 Gross per 24 hour  Intake 647.67 ml  Output 50 ml  Net 597.67 ml   Filed Weights   05/27/21 1426  Weight: 29.5 kg    Examination:  General: No acute distress. Cardiovascular: RRR Lungs: unlabored Abdomen: Soft, nontender, nondistended  Neurological: moaning in pain, moving all extremities Extremities: RLE with intact dressing  Data Reviewed: I have personally reviewed following labs and imaging studies  CBC: Recent Labs  Lab 05/27/21 1431 05/28/21 0405 05/29/21 0641 05/30/21 0344 05/31/21 0333  WBC 5.8 9.1 11.7* 7.6 7.7  NEUTROABS 4.5  --  10.1*  --  6.0  HGB 12.6 10.1* 10.0* 8.1* 7.7*  HCT 37.6 30.3* 31.4* 24.5* 23.2*  MCV 94.2 95.3 95.4 93.9 95.5  PLT 269 203 230 170  [id number]    Basic Metabolic Panel: Recent Labs  Lab 05/27/21 1431 05/28/21 0405 05/29/21 0542 05/30/21 0344 05/31/21 0333  NA 140 140 141 140 144  K 3.2* 3.5 3.8 3.2* 3.7  CL 105 105 104 102 106  CO2 28 30 29 31  32  GLUCOSE 143* 127* 130* 157* 128*  BUN 16 19 23 23  30*  CREATININE 0.51 0.51 0.59 0.63 0.56  CALCIUM 8.3* 8.4* 8.7* 8.2* 8.6*  MG  --   --  2.3  --  2.1  PHOS  --   --  2.9  --  2.9    GFR: Estimated Creatinine Clearance: 25.7 mL/min (by C-G formula based on SCr of 0.56 mg/dL).  Liver Function Tests: Recent Labs  Lab 05/27/21 1431 05/28/21 0405 05/29/21 0542 05/31/21 0333  AST 19 57* 37 21  ALT 17 35 30 11  ALKPHOS 98 90 89 66  BILITOT 0.5 0.7 1.0 0.4  PROT 6.3* 5.5* 6.3* 5.2*  ALBUMIN 3.6 3.0* 3.3* 2.5*    CBG: No results for input(s): GLUCAP in the last 168 hours.   Recent Results (from the past 240 hour(s))  MRSA Next Gen by  PCR, Nasal     Status: None   Collection Time: 05/27/21 11:57 PM   Specimen: Nasal Mucosa; Nasal Swab  Result Value Ref Range Status   MRSA by PCR Next Gen NOT DETECTED NOT DETECTED Final    Comment: (NOTE) The GeneXpert MRSA Assay (FDA approved for NASAL specimens only), is one component of Khoury Siemon comprehensive MRSA colonization surveillance program. It is not intended to diagnose MRSA infection nor to guide or monitor treatment for MRSA infections. Test performance is not FDA approved in patients less than 72 years old. Performed at [facility], [place] [address], [place], [place] [address]   Surgical pcr screen     Status: None   Collection Time: 05/29/21  8:37 AM   Specimen: Nasal Mucosa; Nasal Swab  Result Value Ref Range Status   MRSA, PCR NEGATIVE NEGATIVE Final   Staphylococcus aureus NEGATIVE NEGATIVE Final    Comment: (NOTE) The Xpert SA Assay (FDA approved for NASAL specimens in patients 19 years of age and older), is one component of Reet Scharrer comprehensive surveillance program. It is not intended to diagnose infection nor to guide or monitor treatment. Performed at [facility], [place] [address], [place], [place] [address]          Radiology Studies: No results found.      Scheduled Meds:  docusate sodium  100 mg Oral BID   feeding supplement  237 mL Oral TID BM   ferrous sulfate  325 mg Oral Q breakfast   gabapentin  100 mg Oral QHS   heparin injection (subcutaneous)  5,000 Units Subcutaneous Q12H   irbesartan  150 mg Oral Daily   lidocaine  1 patch Transdermal Q24H   metoprolol tartrate  25 mg Oral BID   multivitamin with minerals  1 tablet Oral Daily   ondansetron (ZOFRAN) IV  4 mg Intravenous Q8H   pantoprazole  40 mg Oral Daily   rosuvastatin  5 mg Oral Daily   [START ON 06/06/2021] thiamine injection  100 mg Intravenous Daily   Continuous Infusions:  acetaminophen 1,000 mg (05/31/21 1336)   dextrose 5% lactated ringers  50 mL/hr at 05/31/21 0103   thiamine injection 500 mg (05/31/21 1232)   Followed by   Derrill Memo ON 06/01/2021] thiamine injection       LOS:  4 days    Time spent: over 30 min    Fayrene Helper, MD [facility]   To contact the attending provider between 7A-7P or the covering provider during after hours 7P-7A, please log into the web site [email or web address] and access using universal [place] password for that web site. If you do not have the password, please call the hospital operator.  05/31/2021, 5:00 PM

## 2021-05-31 NOTE — TOC Progression Note (Addendum)
Transition of Care [facility]) - Progression Note    Patient Details  Name: Julie Durham MRN: [id number] Date of Birth: 1940-01-15  Transition of Care [facility]) CM/SW Contact  Leeroy Cha, RN Phone Number: 05/31/2021, 7:48 AM  Clinical Narrative:    Patient with hx of dementia and requiring mittens.  For snf placment. Will send out to area snfs to see who take the insurance. Tct-son david patient does have medicare a and b.  Has been a resident of [place] for the past two years. Fl2 sent to [facility].  Expected Discharge Plan: [place]    Expected Discharge Plan and Services Expected Discharge Plan: [place]                                               Social Determinants of Health (SDOH) Interventions    Readmission Risk Interventions     View : No data to display.

## 2021-05-31 NOTE — [place] FL2 (Signed)
[place] MEDICAID FL2 LEVEL OF CARE SCREENING TOOL     IDENTIFICATION  Patient Name: Julie Durham Birthdate: 02-25-1940 Sex: female Admission Date (Current Location): 05/27/2021  [facility] and [place] Number:  Producer, television/film/video and Address:  [facility],  [address] [place], [place] [address]      Provider Number: [id number]  Attending Physician Name and Address:  Zigmund Daniel., *  Relative Name and Phone Number:  [phone]    Current Level of Care: Hospital Recommended Level of Care: Skilled Nursing Facility Prior Approval Number:    Date Approved/Denied:   PASRR Number: [id number]  Discharge Plan: SNF    Current Diagnoses: Patient Active Problem List   Diagnosis Date Noted   Delirium 05/30/2021   Acute postoperative anemia due to expected blood loss 05/30/2021   Bladder distention 05/29/2021   Hip fracture ([name]) 05/27/2021   Fall against object 05/27/2021   Dementia ([name]) 05/27/2021   History of cardiomyopathy 05/27/2021   History of CAD (coronary artery disease) 05/27/2021   Infestation by bed bug 05/27/2021   DNR (do not resuscitate) 05/27/2021   Frailty 05/27/2021   Closed right scapular fracture 05/27/2021   Osteoporosis 05/27/2021   Febrile 08/07/2019   Acute lower UTI 01/04/2019   Acute cystitis without hematuria    Acute encephalopathy 01/03/2019   Protein-calorie malnutrition, severe 12/24/2018   Acute pain of right knee    Closed right tibial fracture 12/23/2018   Mild CAD 10/24/2018   S/P kyphoplasty 10/24/2018   Stress fracture of lumbar vertebra 10/24/2018   Systolic dysfunction 10/24/2018   Elevated troponin 10/24/2018   Chest pain 10/24/2018   Brain mass 10/24/2018   Takotsubo syndrome    Lumbar compression fracture ([name]) 10/22/2018   Hyperlipidemia 02/28/2017   Oropharyngeal dysphagia 10/04/2016   Other bursal cyst, left elbow 10/04/2016   Pill esophagitis 10/04/2016   Infected insect bite  06/29/2016   Retained foreign body in soft tissue 06/29/2016   Essential hypertension 11/18/2012   Palpitations 11/18/2012    Orientation RESPIRATION BLADDER Height & Weight     Self  Normal Incontinent Weight: 29.5 kg Height:  4\' 11"  (149.9 cm)  BEHAVIORAL SYMPTOMS/MOOD NEUROLOGICAL BOWEL NUTRITION STATUS      Incontinent Diet (regular)  AMBULATORY STATUS COMMUNICATION OF NEEDS Skin   Extensive Assist Verbally Normal, Surgical wounds                       Personal Care Assistance Level of Assistance  Bathing, Feeding, Dressing Bathing Assistance: Maximum assistance Feeding assistance: Maximum assistance Dressing Assistance: Maximum assistance     Functional Limitations Info  Sight, Hearing, Speech Sight Info: Adequate Hearing Info: Adequate Speech Info: Adequate    SPECIAL CARE FACTORS FREQUENCY  PT (By licensed PT), OT (By licensed OT)     PT Frequency: 5 x weekly OT Frequency: 5 x weekly            Contractures Contractures Info: Not present    Additional Factors Info  Code Status Code Status Info: DNR             Current Medications (05/31/2021):  This is the current hospital active medication list Current Facility-Administered Medications  Medication Dose Route Frequency Provider Last Rate Last Admin   acetaminophen (TYLENOL) tablet 1,000 mg  1,000 mg Oral Q8H 06/02/2021., MD   1,000 mg at 05/30/21 1835   Followed by   06/01/21 ON 06/02/2021] acetaminophen (TYLENOL) tablet  650 mg  650 mg Oral Q6H PRN Zigmund Daniel., MD       dextrose 5 % in lactated ringers infusion   Intravenous Continuous Samson Frederic, MD 50 mL/hr at 05/31/21 0103 New Bag at 05/31/21 0103   docusate sodium (COLACE) capsule 100 mg  100 mg Oral BID Samson Frederic, MD   100 mg at 05/30/21 1322   feeding supplement (ENSURE ENLIVE / ENSURE PLUS) liquid 237 mL  237 mL Oral TID BM Samson Frederic, MD   237 mL at 05/30/21 2232   ferrous sulfate tablet 325 mg  325 mg  Oral Q breakfast Zigmund Daniel., MD       gabapentin (NEURONTIN) capsule 100 mg  100 mg Oral QHS Samson Frederic, MD   100 mg at 05/30/21 2233   heparin injection 5,000 Units  5,000 Units Subcutaneous Q12H Samson Frederic, MD   5,000 Units at 05/30/21 2245   HYDROmorphone (DILAUDID) injection 0.5-1 mg  0.5-1 mg Intravenous Q3H PRN Samson Frederic, MD   1 mg at 05/31/21 0424   irbesartan (AVAPRO) tablet 150 mg  150 mg Oral Daily Samson Frederic, MD   150 mg at 05/30/21 0931   lidocaine (LIDODERM) 5 % 1 patch  1 patch Transdermal Q24H Samson Frederic, MD   1 patch at 05/30/21 1254   lip balm (CARMEX) ointment   Topical PRN Zigmund Daniel., MD   75 application. at 05/29/21 2020   menthol-cetylpyridinium (CEPACOL) lozenge 3 mg  1 lozenge Oral PRN Samson Frederic, MD       Or   phenol (CHLORASEPTIC) mouth spray 1 spray  1 spray Mouth/Throat PRN Swinteck, Arlys John, MD       metoCLOPramide (REGLAN) tablet 5 mg  5 mg Oral Q8H PRN Swinteck, Arlys John, MD       Or   metoCLOPramide (REGLAN) injection 5 mg  5 mg Intravenous Q8H PRN Swinteck, Arlys John, MD       metoprolol tartrate (LOPRESSOR) tablet 25 mg  25 mg Oral BID Samson Frederic, MD   25 mg at 05/30/21 2232   multivitamin with minerals tablet 1 tablet  1 tablet Oral Daily Swinteck, Arlys John, MD   1 tablet at 05/30/21 1322   ondansetron (ZOFRAN) tablet 4 mg  4 mg Oral Q6H PRN Swinteck, Arlys John, MD       Or   ondansetron (ZOFRAN) injection 4 mg  4 mg Intravenous Q6H PRN Swinteck, Arlys John, MD       oxyCODONE (Oxy IR/ROXICODONE) immediate release tablet 2.5 mg  2.5 mg Oral Q4H PRN Zigmund Daniel., MD       Or   oxyCODONE (Oxy IR/ROXICODONE) immediate release tablet 5 mg  5 mg Oral Q4H PRN Zigmund Daniel., MD       pantoprazole (PROTONIX) EC tablet 40 mg  40 mg Oral Daily Samson Frederic, MD   40 mg at 05/30/21 0931   rosuvastatin (CRESTOR) tablet 5 mg  5 mg Oral Daily Samson Frederic, MD   5 mg at 05/30/21 0034     Discharge  Medications: Please see discharge summary for a list of discharge medications.  Relevant Imaging Results:  Relevant Lab Results:   Additional Information [id number]  Golda Acre, RN

## 2021-05-31 NOTE — Progress Notes (Signed)
Rapid Response Note   Reason for Call :  Rapid Response Rounding  Initial Focused Assessment:  Yellow MEWS  Interventions: Patient is hypertensive and tachycardic.    Plan of Care: Discussed with Primary RN Erie Noe at ext [phone].  Patient does have scheduled bedtime and prn blood pressure medications or their MAR.  Offered assistance if needed.  MD Notified: N/A Call Time: N/A  Peter Minium, RN

## 2021-05-31 NOTE — Progress Notes (Signed)
    Subjective:  Patient has dementia at baseline. Per nurse patient has still not returned to baseline and is disoriented x4. Patient reporting pain even with slight touch everywhere. Cannot assess N/V/CP/SOB. Per nurse no N/V/CP/SOB reported or witnessed. Nursing staff was working on changing patient on examination. Re-informed patient that her hip had been surgically fixed.   Objective:   VITALS:   Vitals:   05/30/21 1256 05/30/21 1717 05/30/21 2058 05/31/21 0413  BP: (!) 169/100 (!) 178/89 (!) 169/95 (!) 164/113  Pulse: 99 (!) 110 (!) 106 99  Resp: (!) 22  20 20   Temp: 98.5 F (36.9 C)  97.9 F (36.6 C) 98.4 F (36.9 C)  TempSrc: Oral  Oral Axillary  SpO2: 91%  95% 95%  Weight:      Height:        Patient lying in bed. Mittens on. Does not appear to be in acute distress. Nursing staff at bedside. She has dementia at baseline.  Abdomen soft.  Neurovascularly intact Sensation intact distally, patient reports pain even with light touch everywhere.  Distal pulses intact bilaterally. Patient no responding to command to move feet but did on her on.  Incision C/D/I, aquacel dressing intact.  No cellulitis present.  Compartments soft.    Lab Results  Component Value Date   WBC 7.7 05/31/2021   HGB 7.7 (L) 05/31/2021   HCT 23.2 (L) 05/31/2021   MCV 95.5 05/31/2021   PLT 196 05/31/2021   BMET    Component Value Date/Time   NA 144 05/31/2021 0333   NA 142 10/21/2018 1458   K 3.7 05/31/2021 0333   CL 106 05/31/2021 0333   CO2 32 05/31/2021 0333   GLUCOSE 128 (H) 05/31/2021 0333   BUN 30 (H) 05/31/2021 0333   BUN 16 10/21/2018 1458   CREATININE 0.56 05/31/2021 0333   CREATININE 0.65 04/13/2012 1148   CALCIUM 8.6 (L) 05/31/2021 0333   GFRNONAA >60 05/31/2021 06/02/2021     Assessment/Plan: 2 Days Post-Op   Principal Problem:   Hip fracture ([name]) Active Problems:   Essential hypertension   Chest pain   Protein-calorie malnutrition, severe   Fall against object    Dementia ([name])   History of cardiomyopathy   History of CAD (coronary artery disease)   Infestation by bed bug   DNR (do not resuscitate)   Frailty   Closed right scapular fracture   Osteoporosis   Bladder distention   Delirium   Acute postoperative anemia due to expected blood loss  S/p IM nail from right peritrochanteric femur fracture.   WBAT with walker as able DVT ppx: Lovenox, SCDs, TEDS PO pain control PT/OT: PT hasn't seen since note yesterday. PT was limited. Continue PT as able. Patient walker/wheelchair at baseline. Dispo: Likely d/c to SNF. Patient at [facility] before fall. Hospitalist for medial management. Rx for pain medication and DVT ppx printed in chart.    [facility], PA-C 05/31/2021, 8:24 AM  [facility]  [place] [address], [address], [place], [place] Phone: [phone] [email or web address] Facebook  [phone]

## 2021-05-31 NOTE — Assessment & Plan Note (Signed)
Related to dysphagia

## 2021-06-01 DIAGNOSIS — B888 Other specified infestations: Secondary | ICD-10-CM

## 2021-06-01 DIAGNOSIS — D62 Acute posthemorrhagic anemia: Secondary | ICD-10-CM

## 2021-06-01 DIAGNOSIS — S42191A Fracture of other part of scapula, right shoulder, initial encounter for closed fracture: Secondary | ICD-10-CM

## 2021-06-01 DIAGNOSIS — I10 Essential (primary) hypertension: Secondary | ICD-10-CM

## 2021-06-01 DIAGNOSIS — R410 Disorientation, unspecified: Secondary | ICD-10-CM | POA: Diagnosis not present

## 2021-06-01 DIAGNOSIS — Z8679 Personal history of other diseases of the circulatory system: Secondary | ICD-10-CM

## 2021-06-01 DIAGNOSIS — R627 Adult failure to thrive: Secondary | ICD-10-CM | POA: Diagnosis not present

## 2021-06-01 DIAGNOSIS — Z66 Do not resuscitate: Secondary | ICD-10-CM

## 2021-06-01 DIAGNOSIS — M8000XG Age-related osteoporosis with current pathological fracture, unspecified site, subsequent encounter for fracture with delayed healing: Secondary | ICD-10-CM

## 2021-06-01 DIAGNOSIS — R1319 Other dysphagia: Secondary | ICD-10-CM

## 2021-06-01 DIAGNOSIS — E876 Hypokalemia: Secondary | ICD-10-CM

## 2021-06-01 DIAGNOSIS — W1800XA Striking against unspecified object with subsequent fall, initial encounter: Secondary | ICD-10-CM | POA: Diagnosis not present

## 2021-06-01 DIAGNOSIS — S72001A Fracture of unspecified part of neck of right femur, initial encounter for closed fracture: Secondary | ICD-10-CM | POA: Diagnosis not present

## 2021-06-01 DIAGNOSIS — E43 Unspecified severe protein-calorie malnutrition: Secondary | ICD-10-CM

## 2021-06-01 DIAGNOSIS — N3289 Other specified disorders of bladder: Secondary | ICD-10-CM

## 2021-06-01 DIAGNOSIS — E878 Other disorders of electrolyte and fluid balance, not elsewhere classified: Secondary | ICD-10-CM

## 2021-06-01 LAB — CBC WITH DIFFERENTIAL/PLATELET
Abs Immature Granulocytes: 0.02 10*3/uL (ref 0.00–0.07)
Basophils Absolute: 0 10*3/uL (ref 0.0–0.1)
Basophils Relative: 0 %
Eosinophils Absolute: 0.1 10*3/uL (ref 0.0–0.5)
Eosinophils Relative: 2 %
HCT: 22.6 % — ABNORMAL LOW (ref 36.0–46.0)
Hemoglobin: 7.5 g/dL — ABNORMAL LOW (ref 12.0–15.0)
Immature Granulocytes: 1 %
Lymphocytes Relative: 24 %
Lymphs Abs: 1 10*3/uL (ref 0.7–4.0)
MCH: 31.8 pg (ref 26.0–34.0)
MCHC: 33.2 g/dL (ref 30.0–36.0)
MCV: 95.8 fL (ref 80.0–100.0)
Monocytes Absolute: 0.3 10*3/uL (ref 0.1–1.0)
Monocytes Relative: 8 %
Neutro Abs: 2.8 10*3/uL (ref 1.7–7.7)
Neutrophils Relative %: 65 %
Platelets: 175 10*3/uL (ref 150–400)
RBC: 2.36 MIL/uL — ABNORMAL LOW (ref 3.87–5.11)
RDW: 11.8 % (ref 11.5–15.5)
WBC: 4.3 10*3/uL (ref 4.0–10.5)
nRBC: 0 % (ref 0.0–0.2)

## 2021-06-01 LAB — COMPREHENSIVE METABOLIC PANEL
ALT: 9 U/L (ref 0–44)
AST: 19 U/L (ref 15–41)
Albumin: 2.4 g/dL — ABNORMAL LOW (ref 3.5–5.0)
Alkaline Phosphatase: 63 U/L (ref 38–126)
Anion gap: 3 — ABNORMAL LOW (ref 5–15)
BUN: 17 mg/dL (ref 8–23)
CO2: 30 mmol/L (ref 22–32)
Calcium: 7.7 mg/dL — ABNORMAL LOW (ref 8.9–10.3)
Chloride: 108 mmol/L (ref 98–111)
Creatinine, Ser: 0.44 mg/dL (ref 0.44–1.00)
GFR, Estimated: >60 mL/min (ref >60)
Glucose, Bld: 99 mg/dL (ref 70–99)
Potassium: 3 mmol/L — ABNORMAL LOW (ref 3.5–5.1)
Sodium: 141 mmol/L (ref 135–145)
Total Bilirubin: 0.7 mg/dL (ref 0.3–1.2)
Total Protein: 4.7 g/dL — ABNORMAL LOW (ref 6.5–8.1)

## 2021-06-01 LAB — ABO/RH: ABO/RH(D): A POS

## 2021-06-01 LAB — PREPARE RBC (CROSSMATCH)

## 2021-06-01 LAB — MAGNESIUM: Magnesium: 1.7 mg/dL (ref 1.7–2.4)

## 2021-06-01 LAB — PHOSPHORUS: Phosphorus: 2.6 mg/dL (ref 2.5–4.6)

## 2021-06-01 MED ORDER — KCL-LACTATED RINGERS-D5W 20 MEQ/L IV SOLN
INTRAVENOUS | Status: DC
  Filled 2021-06-01 (×8): qty 1000

## 2021-06-01 MED ORDER — POTASSIUM CHLORIDE 10 MEQ/100ML IV SOLN
10.0000 meq | INTRAVENOUS | Status: AC
  Administered 2021-06-01 (×4): 10 meq via INTRAVENOUS
  Filled 2021-06-01 (×4): qty 100

## 2021-06-01 MED ORDER — ASPIRIN 81 MG PO CHEW: 81.0000 mg | CHEWABLE_TABLET | Freq: Two times a day (BID) | ORAL | 0 refills | Status: DC

## 2021-06-01 MED ORDER — METOPROLOL TARTRATE 5 MG/5ML IV SOLN
2.5000 mg | Freq: Four times a day (QID) | INTRAVENOUS | Status: DC
Start: 2021-06-01 — End: 2021-06-02
  Administered 2021-06-01 – 2021-06-02 (×3): 2.5 mg via INTRAVENOUS
  Filled 2021-06-01 (×3): qty 5

## 2021-06-01 MED ORDER — ACETAMINOPHEN 325 MG PO TABS
650.0000 mg | ORAL_TABLET | Freq: Three times a day (TID) | ORAL | Status: DC
  Administered 2021-06-01: 650 mg via ORAL
  Filled 2021-06-01 (×2): qty 2

## 2021-06-01 MED ORDER — SODIUM CHLORIDE 0.9% IV SOLUTION: Freq: Once | INTRAVENOUS | Status: AC

## 2021-06-01 MED ORDER — MAGNESIUM SULFATE 2 GM/50ML IV SOLN
2.0000 g | Freq: Once | INTRAVENOUS | Status: AC
  Administered 2021-06-01: 2 g via INTRAVENOUS
  Filled 2021-06-01: qty 50

## 2021-06-01 MED ORDER — POTASSIUM CHLORIDE CRYS ER 20 MEQ PO TBCR
40.0000 meq | EXTENDED_RELEASE_TABLET | ORAL | Status: AC
  Administered 2021-06-01: 40 meq via ORAL
  Filled 2021-06-01: qty 2

## 2021-06-01 NOTE — Progress Notes (Signed)
PROGRESS NOTE  Julie Durham JKD:[id number] DOB: [birth year]   PCP: Pcp, No  Patient is from: Nursing home  [name]: 05/27/2021 LOS: 5  Chief complaints Chief Complaint  Patient presents with   Fall     Brief Narrative / Interim history: 81 year old female with history of dementia, CAD, Takotsubo cardiomyopathy, HTN, recurrent falls, compression fractures and severe malnutrition admitted for right hip and right scapular fracture after fall at nursing home.  She underwent IM fixation for right hip fracture on 5/21.  Postop course complicated by delirium.     Subjective: Seen and examined earlier this morning.  Patient is awake but disoriented.  Does not follows command.  Does not appear to be in distress but easily agitated on exam.  Objective: Vitals:   05/31/21 2031 05/31/21 2245 06/01/21 0242 06/01/21 0446  BP: (!) 170/79 (!) 147/71 (!) 160/80 139/66  Pulse: 100 (!) 106 92 86  Resp: 16 20 18 14   Temp: 98.2 F (36.8 C)  98.4 F (36.9 C) 98.8 F (37.1 C)  TempSrc: Axillary  Axillary Axillary  SpO2: 98%  98% 98%  Weight:      Height:        Examination:  GENERAL: Very frail. HEENT: MMM.  Vision and hearing grossly intact.  NECK: Supple.  No apparent JVD.  RESP:  No IWOB.  Fair aeration bilaterally. CVS:  RRR. Heart sounds normal.  ABD/GI/GU: BS+. Abd soft, NTND.  Indwelling Foley catheter.  Urine appears clear. MSK/EXT: Significant muscle mass and subcu fat loss. SKIN: no apparent skin lesion or wound NEURO: Awake but only oriented to self.  Does not follows command.  No apparent focal neuro deficit but limited exam due to mental status. PSYCH: She is easily agitated during exam.  Procedures:  5/21-IM fixation of right femoral fracture  Microbiology summarized: MRSA PCR screen negative. Urine culture pending  Assessment and Plan: * Closed displaced fracture of right femoral neck ([name]) 5/21-IM fixation right femur Ortho c/s, appreciate assistance WBAT with  walker as able Pain management, bowel regimen Post op DVT ppx per ortho - lovenox Daughter at bedside today said she was mostly wheelchair bound, but did use walker at times Likely needs SNF at discharge  Delirium Likely postop delirium in patient with advanced dementia.  Per son, she is oriented to self, family and person at baseline.  Independent with some ADL's.  She has no focal neurodeficit.  Low suspicion for infectious process without fever or leukocytosis.  UA with pyuria without bacteriuria.  CXR negative. -Schedule Tylenol for pain control but has been a challenge since she refuses to take p.o. -Delirium precautions. -Avoid or minimize sedating medications  Failure to thrive in adult Body mass index is 13.13 kg/m.  She has very advanced dementia complicated by in-hospital delirium and refusal to take p.o.  She has significant comorbidity.  Poor prognosis.  DNR/DNI is appropriate.  -Consulted palliative care for further goals of care discussion  Fall against object Fell hitting hip on table and on ground with subsequent right hip and scapular fracture.  CT head without acute finding. She has multiple chronic thoracic and lumbar spine compression fractures without significant spinal or foraminal stenosis.  She has severe osteoporosis. -Pain control -Calcium and vitamin D -Bisphosphonate?  Hypokalemia and hypomagnesemia K3.0.  Mg 1.7. -IV KCl and IV magnesium sulfate  Acute postoperative anemia due to expected blood loss Recent Labs    02/26/21 1035 05/27/21 1431 05/28/21 0405 05/29/21 0998 05/30/21 0344 05/31/21 3382 06/01/21 5053  HGB 13.7 12.6 10.1* 10.0* 8.1* 7.7* 7.5*  Hgb reaching nadir at 7.5.  No obvious bleeding.  There could be some element of hemodilution as well. -Transfuse 1 unit.  Patient's son verbally consented to blood transfusion. -Monitor H&H, and transfuse to keep Hgb > 8.0 given history of CAD.   Osteoporosis She has multiple vertebral  compression fractures of different ages.  Not acute.  She is very malnourished -Vitamin D and calcium if she is willing to take p.o. -Doubt utility of bisphosphonate given her frailty and age  Closed right scapular fracture Likely due to fall.  Plain film with angulated fx at inferior aspect of R scapula Per orthopedics - sling when up, WBAT -> needs f/u with upper extremity specialist  Nonobstructive CAD LHC 10/2018 with mild nonobstructive CAD, mderate segmental LV systolic dysfunction in periapical pattern likely secondary to takotsubo syndrome -IV metoprolol until she is willing to take p.o. meds. -Transfuse to keep Hgb > 8.0.  Essential hypertension Normotensive for most part. -IV metoprolol due to unreliable p.o. intake  Chest pain Difficult to discern due to advanced dementia.  She has history of CAD   Dysphagia Likely due to dementia.  I doubt she has mechanical dysphagia  History of cardiomyopathy Echo 05/2019 with EF [id number], grade 1 diastolic dysfunction.  Appears euvolemic on exam.  Not on diuretics at home. -Monitor fluid and respiratory status   Infestation by bed bug Bed bugs reported on arrival, none noted by EDP Will continue contact precautions for now and monitor closely  DNR (do not resuscitate) Noted on most form (not completely filled out - see media) and confirmed with son  Bladder distention Bladder scan and strict intake and output   Vertebral compression fracture ([name]) CT shows age-indeterminate and old compression fractures with no significant canal stenosis.  This could be due to underlying osteoporosis. -Pain control   DVT prophylaxis:  heparin injection 5,000 Units Start: 05/30/21 0800 SCDs Start: 05/29/21 1457  Code Status: DNR/DNI Family Communication: Updated patient's son over the phone. Level of care: Telemetry Status is: Inpatient Remains inpatient appropriate because: Due to postop delirium, poor p.o. intake   Final  disposition: SNF Consultants:  Orthopedic surgery Palliative medicine  Sch Meds:  Scheduled Meds:  sodium chloride   Intravenous Once   acetaminophen  650 mg Oral Q8H   Chlorhexidine Gluconate Cloth  6 each Topical Daily   docusate sodium  100 mg Oral BID   feeding supplement  237 mL Oral TID BM   ferrous sulfate  325 mg Oral Q breakfast   gabapentin  100 mg Oral QHS   heparin injection (subcutaneous)  5,000 Units Subcutaneous Q12H   irbesartan  150 mg Oral Daily   lidocaine  1 patch Transdermal Q24H   metoprolol tartrate  2.5 mg Intravenous Q6H   multivitamin with minerals  1 tablet Oral Daily   pantoprazole  40 mg Oral Daily   potassium chloride  40 mEq Oral Q4H   rosuvastatin  5 mg Oral Daily   [START ON 06/06/2021] thiamine injection  100 mg Intravenous Daily   Continuous Infusions:  cefTRIAXone (ROCEPHIN)  IV 1 g (06/01/21 0028)   dextrose 5% lactated ringers 50 mL/hr at 06/01/21 0653   thiamine injection 250 mg (06/01/21 1410)   PRN Meds:.HYDROmorphone (DILAUDID) injection, lip balm, menthol-cetylpyridinium **OR** phenol, metoCLOPramide **OR** metoCLOPramide (REGLAN) injection, metoprolol tartrate, naLOXone (NARCAN)  injection, ondansetron **OR** ondansetron (ZOFRAN) IV, oxyCODONE **OR** oxyCODONE  Antimicrobials: Anti-infectives (From admission, onward)  Start     Dose/Rate Route Frequency Ordered Stop   06/01/21 0100  cefTRIAXone (ROCEPHIN) 1 g in sodium chloride 0.9 % 100 mL IVPB        1 g 200 mL/hr over 30 Minutes Intravenous Every 24 hours 05/31/21 2355     05/29/21 2200  ceFAZolin (ANCEF) IVPB 2g/100 mL premix        2 g 200 mL/hr over 30 Minutes Intravenous Every 12 hours 05/29/21 1456 05/29/21 2150   05/29/21 1150  ceFAZolin (ANCEF) 2-4 GM/100ML-% IVPB       Note to Pharmacy: Dara Lords M: cabinet override      05/29/21 1150 05/29/21 1316   05/29/21 0900  ceFAZolin (ANCEF) IVPB 2g/100 mL premix        2 g 200 mL/hr over 30 Minutes Intravenous On call to  O.R. 05/29/21 0803 05/29/21 1317        I have personally reviewed the following labs and images: CBC: Recent Labs  Lab 05/27/21 1431 05/28/21 0405 05/29/21 0641 05/30/21 0344 05/31/21 0333 06/01/21 0637  WBC 5.8 9.1 11.7* 7.6 7.7 4.3  NEUTROABS 4.5  --  10.1*  --  6.0 2.8  HGB 12.6 10.1* 10.0* 8.1* 7.7* 7.5*  HCT 37.6 30.3* 31.4* 24.5* 23.2* 22.6*  MCV 94.2 95.3 95.4 93.9 95.5 95.8  PLT 269 203 230 170 196 175   BMP &GFR Recent Labs  Lab 05/28/21 0405 05/29/21 0542 05/30/21 0344 05/31/21 0333 06/01/21 0637  NA 140 141 140 144 141  K 3.5 3.8 3.2* 3.7 3.0*  CL 105 104 102 106 108  CO2 30 29 31  32 30  GLUCOSE 127* 130* 157* 128* 99  BUN 19 23 23  30* 17  CREATININE 0.51 0.59 0.63 0.56 0.44  CALCIUM 8.4* 8.7* 8.2* 8.6* 7.7*  MG  --  2.3  --  2.1 1.7  PHOS  --  2.9  --  2.9 2.6   Estimated Creatinine Clearance: 25.7 mL/min (by C-G formula based on SCr of 0.44 mg/dL). Liver & Pancreas: Recent Labs  Lab 05/27/21 1431 05/28/21 0405 05/29/21 0542 05/31/21 0333 06/01/21 0637  AST 19 57* 37 21 19  ALT 17 35 30 11 9   ALKPHOS 98 90 89 66 63  BILITOT 0.5 0.7 1.0 0.4 0.7  PROT 6.3* 5.5* 6.3* 5.2* 4.7*  ALBUMIN 3.6 3.0* 3.3* 2.5* 2.4*   No results for input(s): LIPASE, AMYLASE in the last 168 hours. No results for input(s): AMMONIA in the last 168 hours. Diabetic: No results for input(s): HGBA1C in the last 72 hours. No results for input(s): GLUCAP in the last 168 hours. Cardiac Enzymes: Recent Labs  Lab 05/27/21 1845  CKTOTAL 97   No results for input(s): PROBNP in the last 8760 hours. Coagulation Profile: No results for input(s): INR, PROTIME in the last 168 hours. Thyroid Function Tests: No results for input(s): TSH, T4TOTAL, FREET4, T3FREE, THYROIDAB in the last 72 hours. Lipid Profile: No results for input(s): CHOL, HDL, LDLCALC, TRIG, CHOLHDL, LDLDIRECT in the last 72 hours. Anemia Panel: No results for input(s): VITAMINB12, FOLATE, FERRITIN, TIBC,  IRON, RETICCTPCT in the last 72 hours. Urine analysis:    Component Value Date/Time   COLORURINE YELLOW 05/31/2021 1832   APPEARANCEUR CLEAR 05/31/2021 1832   LABSPEC 1.012 05/31/2021 1832   PHURINE 6.0 05/31/2021 1832   GLUCOSEU NEGATIVE 05/31/2021 1832   HGBUR NEGATIVE 05/31/2021 1832   BILIRUBINUR NEGATIVE 05/31/2021 1832   KETONESUR NEGATIVE 05/31/2021 1832   PROTEINUR NEGATIVE 05/31/2021 1832  NITRITE POSITIVE (A) 05/31/2021 1832   LEUKOCYTESUR NEGATIVE 05/31/2021 1832   Sepsis Labs: Invalid input(s): PROCALCITONIN, [place]  Microbiology: Recent Results (from the past 240 hour(s))  MRSA Next Gen by PCR, Nasal     Status: None   Collection Time: 05/27/21 11:57 PM   Specimen: Nasal Mucosa; Nasal Swab  Result Value Ref Range Status   MRSA by PCR Next Gen NOT DETECTED NOT DETECTED Final    Comment: (NOTE) The GeneXpert MRSA Assay (FDA approved for NASAL specimens only), is one component of a comprehensive MRSA colonization surveillance program. It is not intended to diagnose MRSA infection nor to guide or monitor treatment for MRSA infections. Test performance is not FDA approved in patients less than 82 years old. Performed at [facility], [place] [address], [place], [place] [address]   Surgical pcr screen     Status: None   Collection Time: 05/29/21  8:37 AM   Specimen: Nasal Mucosa; Nasal Swab  Result Value Ref Range Status   MRSA, PCR NEGATIVE NEGATIVE Final   Staphylococcus aureus NEGATIVE NEGATIVE Final    Comment: (NOTE) The Xpert SA Assay (FDA approved for NASAL specimens in patients 53 years of age and older), is one component of a comprehensive surveillance program. It is not intended to diagnose infection nor to guide or monitor treatment. Performed at [facility], [place] [address], [place],  [address]     Radiology Studies: DG CHEST PORT 1 VIEW  Result Date: 05/31/2021 CLINICAL DATA:  Altered mental  status. EXAM: PORTABLE CHEST 1 VIEW COMPARISON:  May 27, 2021 FINDINGS: The heart size and mediastinal contours are within normal limits. Aortic atherosclerosis. No focal airspace consolidation. No visible pleural effusion or pneumothorax. Prior lower thoracic vertebral augmentation. IMPRESSION: 1. No acute cardiopulmonary findings. 2.  Aortic Atherosclerosis (ICD10-I70.0). Electronically Signed   By: Dahlia Bailiff M.D.   On: 05/31/2021 17:28      Maikol Grassia T. [facility]  If 7PM-7AM, please contact night-coverage [email or web address] 06/01/2021, 4:35 PM

## 2021-06-01 NOTE — Evaluation (Signed)
SLP Cancellation Note  Patient Details Name: Julie Durham MRN: [id number] DOB: 01/04/1941   Cancelled treatment:       Reason Eval/Treat Not Completed: Other (comment);Patient declined, no reason specified (pt yelling out with attempts to provide her with icee, ice cream or ice for swallow eval- NT reports pt will consume ice; Pt yelled "get out" and stated "bring me back my leg", attempted to reorient without success)   Chales Abrahams 06/01/2021, 9:25 AM  Rolena Infante, MS [facility] SLP Acute Rehab Services Office [phone] Pager [phone]

## 2021-06-01 NOTE — Progress Notes (Signed)
Physical Therapy Treatment Patient Details Name: Julie Durham MRN: [id number] DOB: [birth year] Today's Date: 06/01/2021   History of Present Illness Julie Durham is a 81 y.o. female presenting to the ED from [place] on 5/19 after a fall. Pt found to have Rt femoral neck fracture now s/p ORIF for IM nail. CT also revealed moderate compression of the acute or subacute L5 vertebral body, age indeterminate L3 compression fx, remote healed right pubic rami fx. PMH significant forsSevere osteoporosis with chronic thoracic compression fractures at 7 vertebral levels, HTN, CAD, OA, takotsubo syndrome.    PT Comments    The patient is lying on her left side, in a fetal position. Patient moans out when repositioning in bed, Patient did not move from Left side. Patient not communicative except moaning. Continue attempts for mobility.   Recommendations for follow up therapy are one component of a multi-disciplinary discharge planning process, led by the attending physician.  Recommendations may be updated based on patient status, additional functional criteria and insurance authorization.  Follow Up Recommendations  Skilled nursing-short term rehab (<3 hours/day)     Assistance Recommended at Discharge Frequent or constant Supervision/Assistance  Patient can return home with the following Two people to help with walking and/or transfers;Two people to help with bathing/dressing/bathroom;Direct supervision/assist for medications management;Assistance with feeding;Assistance with cooking/housework;Direct supervision/assist for financial management;Assist for transportation;Help with stairs or ramp for entrance   Equipment Recommendations  None recommended by PT    Recommendations for Other Services       Precautions / Restrictions Precautions Precautions: Fall Restrictions RLE Weight Bearing: Weight bearing as tolerated Other Position/Activity Restrictions: Pt with R scapular fx 1  week ago     Mobility  Bed Mobility Overal bed mobility: Needs Assistance             General bed mobility comments: Total Assist for adjusting in bed, pt in fetal position at start. pt unable to tolerate mobility beyond attempts to adjust.    Transfers                   General transfer comment: unable to transfer    Ambulation/Gait                   Stairs             Wheelchair Mobility    Modified Rankin (Stroke Patients Only)       Balance                                            Cognition Arousal/Alertness: Awake/alert Behavior During Therapy: Restless, Anxious Overall Cognitive Status: Difficult to assess                                 General Comments: patient not following directions, on Left side and in fetal position        Exercises      General Comments        Pertinent Vitals/Pain Pain Assessment Pain Assessment: Faces Faces Pain Scale: Hurts worst Negative Vocalization: repeated troubled calling out, loud moaning/groaning, crying Facial Expression: facial grimacing Body Language: rigid, fists clenched, knees up, pushing/pulling away, strikes out Consolability: unable to console, distract or reassure Pain Location: 'My leg" non specific Pain Descriptors / Indicators: Aching, Grimacing, Operative site  guarding, Moaning Pain Intervention(s): Limited activity within patient's tolerance    Home Living                          Prior Function            PT Goals (current goals can now be found in the care plan section) Progress towards PT goals: Not progressing toward goals - comment (patient not able to participate)    Frequency    Min 2X/week      PT Plan Current plan remains appropriate    Co-evaluation              AM-PAC PT "6 Clicks" Mobility   Outcome Measure  Help needed turning from your back to your side while in a flat bed without using  bedrails?: Total Help needed moving from lying on your back to sitting on the side of a flat bed without using bedrails?: Total Help needed moving to and from a bed to a chair (including a wheelchair)?: Total Help needed standing up from a chair using your arms (e.g., wheelchair or bedside chair)?: Total Help needed to walk in hospital room?: Total Help needed climbing 3-5 steps with a railing? : Total 6 Click Score: 6    End of Session   Activity Tolerance: Patient limited by pain Patient left: in bed;with bed alarm set;with call bell/phone within reach Nurse Communication: Mobility status PT Visit Diagnosis: Unsteadiness on feet (R26.81);Muscle weakness (generalized) (M62.81);Other abnormalities of gait and mobility (R26.89);Pain;History of falling (Z91.81);Difficulty in walking, not elsewhere classified (R26.2) Pain - Right/Left: Right Pain - part of body: Hip;Shoulder;Leg     Time: 4401-0272 PT Time Calculation (min) (ACUTE ONLY): 10 min  Charges:  $Therapeutic Activity: 8-22 mins                     Blanchard Kelch PT Acute Rehabilitation Services Pager [phone] Office [phone]    Rada Hay 06/01/2021, 1:00 PM

## 2021-06-01 NOTE — Progress Notes (Signed)
Pt currently receiving Potassium infusions and will receive blood transfusion when ready SRP, RN

## 2021-06-01 NOTE — Progress Notes (Signed)
Pt noted with decreased intake, refuse to take any oral liquids or food today. Refused oral meds, pt spit out oral meds when mixed in ice cream or jello. Refused Ensure, offered am dose, pushed back, "I don't want it, take it away". Pain given throughout the day for hip and generalized discomfort. Refused to be turned, pt contracted in fetal position. Turned on right operative side, pt yelled more intensely, pt yell and scream out intermittently until pain med is administered. Lidocaine patch applied to right hip. Will cont to monitor. SRP, RN

## 2021-06-01 NOTE — TOC Progression Note (Signed)
Transition of Care [facility]) - Progression Note    Patient Details  Name: Julie Durham MRN: [id number] Date of Birth: Jul 24, 1940  Transition of Care [facility]) CM/SW Contact  Leeroy Cha, RN Phone Number: 06/01/2021, 8:38 AM  Clinical Narrative:    Opatient and insurance accepted by [facility].   Expected Discharge Plan: [place]    Expected Discharge Plan and Services Expected Discharge Plan: [place]                                               Social Determinants of Health (SDOH) Interventions    Readmission Risk Interventions     View : No data to display.

## 2021-06-01 NOTE — Assessment & Plan Note (Addendum)
CT shows age-indeterminate and old compression fractures with no significant canal stenosis.  This could be due to underlying osteoporosis.

## 2021-06-01 NOTE — Progress Notes (Signed)
Received informed verbal consent from pt son Vittoria Noreen, has has dementia. SRP, RN

## 2021-06-01 NOTE — Assessment & Plan Note (Addendum)
Replenish and recheck as appropriate

## 2021-06-01 NOTE — Plan of Care (Signed)
  Problem: Activity: Goal: Ability to ambulate and perform ADLs will improve Outcome: Progressing   

## 2021-06-02 DIAGNOSIS — Z515 Encounter for palliative care: Secondary | ICD-10-CM

## 2021-06-02 DIAGNOSIS — Z7189 Other specified counseling: Secondary | ICD-10-CM

## 2021-06-02 DIAGNOSIS — R627 Adult failure to thrive: Secondary | ICD-10-CM | POA: Diagnosis not present

## 2021-06-02 DIAGNOSIS — W1800XA Striking against unspecified object with subsequent fall, initial encounter: Secondary | ICD-10-CM | POA: Diagnosis not present

## 2021-06-02 DIAGNOSIS — R531 Weakness: Secondary | ICD-10-CM

## 2021-06-02 DIAGNOSIS — S72001A Fracture of unspecified part of neck of right femur, initial encounter for closed fracture: Secondary | ICD-10-CM | POA: Diagnosis not present

## 2021-06-02 DIAGNOSIS — E43 Unspecified severe protein-calorie malnutrition: Secondary | ICD-10-CM | POA: Diagnosis not present

## 2021-06-02 DIAGNOSIS — R410 Disorientation, unspecified: Secondary | ICD-10-CM | POA: Diagnosis not present

## 2021-06-02 LAB — CK: Total CK: 927 U/L — ABNORMAL HIGH (ref 38–234)

## 2021-06-02 LAB — CBC
HCT: 32.7 % — ABNORMAL LOW (ref 36.0–46.0)
Hemoglobin: 11.1 g/dL — ABNORMAL LOW (ref 12.0–15.0)
MCH: 30.9 pg (ref 26.0–34.0)
MCHC: 33.9 g/dL (ref 30.0–36.0)
MCV: 91.1 fL (ref 80.0–100.0)
Platelets: 209 10*3/uL (ref 150–400)
RBC: 3.59 MIL/uL — ABNORMAL LOW (ref 3.87–5.11)
RDW: 12.1 % (ref 11.5–15.5)
WBC: 7.1 10*3/uL (ref 4.0–10.5)
nRBC: 0 % (ref 0.0–0.2)

## 2021-06-02 LAB — TYPE AND SCREEN
ABO/RH(D): A POS
Antibody Screen: NEGATIVE
Unit division: 0

## 2021-06-02 LAB — BPAM RBC
Blood Product Expiration Date: 202306162359
ISSUE DATE / TIME: 202305242305
Unit Type and Rh: 6200

## 2021-06-02 LAB — RENAL FUNCTION PANEL
Albumin: 2.7 g/dL — ABNORMAL LOW (ref 3.5–5.0)
Anion gap: 7 (ref 5–15)
BUN: 10 mg/dL (ref 8–23)
CO2: 28 mmol/L (ref 22–32)
Calcium: 7.9 mg/dL — ABNORMAL LOW (ref 8.9–10.3)
Chloride: 102 mmol/L (ref 98–111)
Creatinine, Ser: 0.37 mg/dL — ABNORMAL LOW (ref 0.44–1.00)
GFR, Estimated: >60 mL/min (ref >60)
Glucose, Bld: 100 mg/dL — ABNORMAL HIGH (ref 70–99)
Phosphorus: 2.4 mg/dL — ABNORMAL LOW (ref 2.5–4.6)
Potassium: 3.2 mmol/L — ABNORMAL LOW (ref 3.5–5.1)
Sodium: 137 mmol/L (ref 135–145)

## 2021-06-02 LAB — MAGNESIUM: Magnesium: 1.9 mg/dL (ref 1.7–2.4)

## 2021-06-02 MED ORDER — ACETAMINOPHEN 650 MG RE SUPP
650.0000 mg | Freq: Four times a day (QID) | RECTAL | Status: DC
  Administered 2021-06-02 – 2021-06-06 (×12): 650 mg via RECTAL
  Filled 2021-06-02 (×14): qty 1

## 2021-06-02 MED ORDER — POTASSIUM CHLORIDE 10 MEQ/100ML IV SOLN
10.0000 meq | INTRAVENOUS | Status: AC
  Administered 2021-06-02 (×4): 10 meq via INTRAVENOUS
  Filled 2021-06-02 (×4): qty 100

## 2021-06-02 MED ORDER — METOPROLOL TARTRATE 5 MG/5ML IV SOLN
5.0000 mg | Freq: Four times a day (QID) | INTRAVENOUS | Status: DC
  Administered 2021-06-02 – 2021-06-06 (×15): 5 mg via INTRAVENOUS
  Filled 2021-06-02 (×15): qty 5

## 2021-06-02 MED ORDER — ACETAMINOPHEN 325 MG PO TABS: 650.0000 mg | ORAL_TABLET | Freq: Four times a day (QID) | ORAL | Status: DC

## 2021-06-02 MED ORDER — ACETAMINOPHEN 325 MG PO TABS
650.0000 mg | ORAL_TABLET | Freq: Four times a day (QID) | ORAL | Status: DC
Start: 2021-06-02 — End: 2021-06-06
  Administered 2021-06-06: 650 mg via ORAL
  Filled 2021-06-02: qty 2

## 2021-06-02 MED ORDER — HALOPERIDOL LACTATE 5 MG/ML IJ SOLN
0.5000 mg | Freq: Four times a day (QID) | INTRAMUSCULAR | Status: DC | PRN
  Administered 2021-06-03: 0.5 mg via INTRAVENOUS
  Filled 2021-06-02: qty 1

## 2021-06-02 MED ORDER — LABETALOL HCL 5 MG/ML IV SOLN
10.0000 mg | INTRAVENOUS | Status: DC | PRN
  Administered 2021-06-02 – 2021-06-06 (×7): 10 mg via INTRAVENOUS
  Filled 2021-06-02 (×8): qty 4

## 2021-06-02 NOTE — Consult Note (Addendum)
Consultation Note Date: 06/02/2021   Patient Name: Julie Durham  DOB: 27-Dec-1940  MRN: [id number]  Age / Sex: 81 y.o., female  PCP: Pcp, No Referring Physician: Almon Hercules, MD  Reason for Consultation: Establishing goals of care  HPI/Patient Profile: 81 y.o. female  admitted on 05/27/2021   Clinical Assessment and Goals of Care: 16 81 year old with a history of dementia who lives at [facility] facility since December 2020, admitted for right hip and a right scapular fracture after fall at nursing home.  She underwent IM fixation for right hip fracture on 5-21.  Palliative medicine team consulted because postop course has been complicated by delirium and to discuss broad goals of care in this context.  Has underlying history of dementia, coronary artery disease, Takotsubo cardiomyopathy, hypertension compression fractures recurrent falls and severe malnutrition. A palliative consult has been requested for ongoing goals of care discussions. Palliative medicine is specialized medical care for people living with serious illness. It focuses on providing relief from the symptoms and stress of a serious illness. The goal is to improve quality of life for both the patient and the family. Goals of care: Broad aims of medical therapy in relation to the patient's values and preferences. Our aim is to provide medical care aimed at enabling patients to achieve the goals that matter most to them, given the circumstances of their particular medical situation and their constraints.   Patient awakens easily, she is resting in bed.  She appears frail and weak.  She attempts to answer some questions appropriately however largely mumbles.  She does not follow commands.  At present she is not agitated but she has been noted to be restless and agitated, oral intake has been minimal for most of this hospitalization. Discussed  in an interdisciplinary manner with bedside nursing staff, call placed and also discussed with the NP colleague at [facility].  It is noted that the patient's baseline was such that she was able to carry on a conversation, she was able to feed herself, she was able to use a walker to walk short distances otherwise was able to self propel herself in the wheelchair.  She has had gradual cognitive decline and she has had gradual decrease in oral intake even prior to this hospitalization.  Call placed and discussed with the patient's son Onalee Hua as well. Brief life review performed.  Goals of care discussed.  Discussed about scope of current hospitalization.  Please note additional discussions in the summary of recommendations.  NEXT OF KIN Son Onalee Hua, also has a daughter.  SUMMARY OF RECOMMENDATIONS   DNR/DNI Continue current mode of care. Add low-dose Haldol IV as needed to be used only for severe uncontrolled agitation. Monitor overall hospital course and overall disease trajectory of illness for the next 48 to 72 hours.  If the patient has some degree of stabilization/recovery, increased participation with PT OT, improving oral intake, then consider discharge back to [place] with continuation of physical therapy and addition of palliative support.  If  the patient has escalating symptom burden and no meaningful recovery in the next 2-3 days, then would consider discharge back to [place] with addition of hospice services.  Discussed this in detail with the patient's son Onalee Hua as well as with NP colleague at [facility]. Thank you for the consult.  Code Status/Advance Care Planning: DNR   Symptom Management:    Palliative Prophylaxis:  Delirium Protocol Psycho-social/Spiritual:  Desire for further Chaplaincy support:yes Additional Recommendations: Education on Hospice  Prognosis:  Unable to determine  Discharge Planning: To Be Determined      Primary  Diagnoses: Present on Admission:  Closed displaced fracture of right femoral neck ([name])  Essential hypertension  Protein-calorie malnutrition, severe   I have reviewed the medical record, interviewed the patient and family, and examined the patient. The following aspects are pertinent.  Past Medical History:  Diagnosis Date   Acute cystitis without hematuria    Acute encephalopathy 01/03/2019   Acute lower UTI 01/04/2019   Acute pain of right knee    Allergy    Anemia    Arthritis    Asthma    Brain mass    a. 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   Cataract    Elevated troponin    Encephalitis, viral    a. 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 Echo 2011: LVEF >55% w/ grade 1 DD, mild MR, mild TR, elevated RVSP 30-56mmHg   Mild tricuspid regurgitation    Oropharyngeal dysphagia 10/04/2016   Other bursal cyst, left elbow 10/04/2016   Palpitations    Pill esophagitis 10/04/2016   Stress fracture of lumbar vertebra 10/24/2018   Systolic dysfunction 10/24/2018   Nonischemic cardiomyopathy   Takotsubo cardiomyopathy    Takotsubo syndrome    Social History   Socioeconomic History   Marital status: Legally Separated    Spouse name: Not on file   Number of children: Not on file   Years of education: Not on file   Highest education level: Not on file  Occupational History   Not on file  Tobacco Use   Smoking status: Never   Smokeless tobacco: Never  Vaping Use   Vaping Use: Never used  Substance and Sexual Activity   Alcohol use: Never   Drug use: Never   Sexual activity: Not on file  Other Topics Concern   Not on file  Social History  Narrative   Not on file   Social Determinants of Health   Financial Resource Strain: Not on file  Food Insecurity: Not on file  Transportation Needs: Not on file  Physical Activity: Not on file  Stress: Not on file  Social Connections: Not on file   Family History  Problem Relation Age of Onset   Alzheimer's disease Mother    Heart attack Father    Heart disease Brother    Diabetes Son    Sensorineural hearing loss Sister    Heart disease Brother    Heart disease Brother    Thyroid disease Brother    Scheduled Meds:  acetaminophen  650 mg Oral Q6H WA   Chlorhexidine Gluconate Cloth  6 each Topical Daily  docusate sodium  100 mg Oral BID   feeding supplement  237 mL Oral TID BM   gabapentin  100 mg Oral QHS   heparin injection (subcutaneous)  5,000 Units Subcutaneous Q12H   irbesartan  150 mg Oral Daily   lidocaine  1 patch Transdermal Q24H   metoprolol tartrate  5 mg Intravenous Q6H   multivitamin with minerals  1 tablet Oral Daily   pantoprazole  40 mg Oral Daily   [START ON 06/06/2021] thiamine injection  100 mg Intravenous Daily   Continuous Infusions:  cefTRIAXone (ROCEPHIN)  IV 1 g (06/02/21 0108)   dextrose 5% lactated ringers with KCl 20 mEq/L 60 mL/hr at 06/02/21 1341   thiamine injection 250 mg (06/02/21 0956)   PRN Meds:.HYDROmorphone (DILAUDID) injection, labetalol, lip balm, menthol-cetylpyridinium **OR** phenol, metoCLOPramide **OR** metoCLOPramide (REGLAN) injection, naLOXone (NARCAN)  injection, ondansetron **OR** ondansetron (ZOFRAN) IV, oxyCODONE **OR** oxyCODONE Medications Prior to Admission:  Prior to Admission medications   Medication Sig Start Date End Date Taking? Authorizing Provider  acetaminophen (TYLENOL) 500 MG tablet Take 1 tablet (500 mg total) by mouth every 6 (six) hours as needed for mild pain or moderate pain. Patient taking differently: Take 500 mg by mouth in the morning, at noon, in the evening, and at bedtime. Or as needed for pain.  Not to exceed 3 grams in 24 hours. 01/08/19  Yes Hongalgi, Maximino Greenland, MD  aspirin (ASPIRIN CHILDRENS) 81 MG chewable tablet Chew 1 tablet (81 mg total) by mouth 2 (two) times daily with a meal. 06/01/21 07/16/21 Yes Hill, Alain Honey, PA-C  aspirin EC 81 MG tablet Take 81 mg by mouth daily.    Yes [provider]  Calcium Carbonate-Vitamin D 600-400 MG-UNIT tablet Take 1 tablet by mouth 2 (two) times daily.   Yes [provider]  diclofenac Sodium (VOLTAREN) 1 % GEL Apply 4 g topically 2 (two) times daily.   Yes [provider]  feeding supplement, ENSURE ENLIVE, (ENSURE ENLIVE) LIQD Take 237 mLs by mouth 3 (three) times daily between meals. 01/08/19  Yes Hongalgi, Maximino Greenland, MD  fluticasone (FLONASE) 50 MCG/ACT nasal spray Place 1 spray into both nostrils daily.   Yes [provider]  fluticasone-salmeterol (ADVAIR) 250-50 MCG/ACT AEPB Inhale 1 puff into the lungs in the morning and at bedtime.   Yes [provider]  gabapentin (NEURONTIN) 100 MG capsule Take 100 mg by mouth daily.   Yes [provider]  HYDROcodone-acetaminophen (NORCO/VICODIN) 5-325 MG tablet Take 1 tablet by mouth every 4 (four) hours as needed for up to 7 days for moderate pain or severe pain. 05/31/21 06/07/21 Yes Hill, Alain Honey, PA-C  irbesartan (AVAPRO) 150 MG tablet Take 1 tablet (150 mg total) by mouth daily. 10/21/18  Yes Goodrich, Callie E, PA-C  lidocaine 4 % Place 1 patch onto the skin daily.   Yes [provider]  meloxicam (MOBIC) 7.5 MG tablet Take 7.5 mg by mouth daily.   Yes [provider]  Menthol, Topical Analgesic, (BIOFREEZE) 4 % GEL Apply 1 application. topically 3 (three) times daily.   Yes [provider]  metoprolol tartrate (LOPRESSOR) 25 MG tablet Take 25 mg by mouth 2 (two) times daily.   Yes [provider]  Multiple Vitamins-Minerals (OCUVITE EXTRA) TABS Take 1 tablet by mouth 2 (two) times daily.   Yes [provider]   nitroGLYCERIN (NITROSTAT) 0.4 MG SL tablet Place 0.4 mg under the tongue every 5 (five) minutes as needed for chest  pain.   Yes [provider]  omeprazole (PRILOSEC) 20 MG capsule Take 40 mg by mouth daily.   Yes [provider]  rosuvastatin (CRESTOR) 5 MG tablet Take 5 mg by mouth daily. 01/27/19  Yes [provider]   Allergies  Allergen Reactions   Doxycycline Hyclate Anaphylaxis and Other (See Comments)    Chest pain   Shellfish Allergy Anaphylaxis   Demerol Other (See Comments)    Sedation   Sulfa Antibiotics Other (See Comments)    Unknown rxn   Review of Systems Mumbles some words, denies pain Physical Exam Frail elderly lady resting in bed in [place] regular breath sounds Has indwelling Foley catheter Has muscle wasting and sarcopenia Not currently restless or agitated. Does not follow commands but does mumble appropriately to some questions asked  Vital Signs: BP (!) 186/93 (BP Location: Right Arm)   Pulse (!) 109   Temp 97.9 F (36.6 C) (Axillary)   Resp 18   Ht 4\' 11"  (1.499 m)   Wt 29.5 kg   SpO2 95%   BMI 13.13 kg/m  Pain Scale: PAINAD POSS *See Group Information*: 1-Acceptable,Awake and alert Pain Score: Asleep   SpO2: SpO2: 95 % O2 Device:SpO2: 95 % O2 Flow Rate: .O2 Flow Rate (L/min): 1 L/min  IO: Intake/output summary:  Intake/Output Summary (Last 24 hours) at 06/02/2021 1426 Last data filed at 06/02/2021 0900 Gross per 24 hour  Intake 1694.17 ml  Output 2550 ml  Net -855.83 ml    LBM: Last BM Date : 06/01/21 Baseline Weight: Weight: 29.5 kg Most recent weight: Weight: 29.5 kg     Palliative Assessment/Data:     Palliative performance scale 40%.  Time In: 1300 Time Out: 1410 Time Total: 70 Greater than 50%  of this time was spent counseling and coordinating care related to the above assessment and plan.  Signed by: Rosalin HawkingZeba Drexel Ivey, MD   Please contact Palliative Medicine Team phone at [phone] for questions  and concerns.  For individual provider: See Loretha StaplerAmion

## 2021-06-02 NOTE — Progress Notes (Signed)
PROGRESS NOTE  Julie BelfastBarbara E Billman ZOX:[id number] DOB: 02-02-1940   PCP: Pcp, No  Patient is from: Nursing home  [name]: 05/27/2021 LOS: 6  Chief complaints Chief Complaint  Patient presents with   Fall     Brief Narrative / Interim history: 81 year old female with history of dementia, CAD, Takotsubo cardiomyopathy, HTN, recurrent falls, compression fractures and severe malnutrition admitted for right hip and right scapular fracture after fall at nursing home.  She underwent IM fixation for right hip fracture on 5/21.  Postop course complicated by delirium.  Palliative medicine consulted.    Subjective: Seen and examined earlier this morning.  No major events overnight of this morning.  She continues to refuse p.o. intake or medication.  Remains confused and delirious.  She is only oriented to self.  Objective: Vitals:   06/01/21 2326 06/02/21 0230 06/02/21 0401 06/02/21 1221  BP:  (!) 152/83 (!) 172/96 (!) 186/93  Pulse:  71 79 (!) 109  Resp:  20 12 18   Temp:  98.3 F (36.8 C) 97.9 F (36.6 C)   TempSrc: Axillary Axillary Axillary   SpO2:  100% 98% 95%  Weight:      Height:        Examination:  GENERAL: Very frail. HEENT: MMM.  Vision and hearing grossly intact.  NECK: Supple.  No apparent JVD.  RESP:  No IWOB.  Fair aeration bilaterally. CVS:  RRR. Heart sounds normal.  ABD/GI/GU: BS+. Abd soft, NTND.  Indwelling Foley catheter MSK/EXT:  Moves extremities.  Significant muscle mass and subcu fat loss. SKIN: Dressing over right hip DCI. NEURO: Awake.  Oriented to self. No apparent focal neuro deficit but limited exam due to mental status. PSYCH: Appears anxious and confused.  Procedures:  5/21-IM fixation of right femoral fracture  Microbiology summarized: MRSA PCR screen negative. Urine culture with E. coli.  Assessment and Plan: * Closed displaced fracture of right femoral neck ([name]) 5/21-IM fixation right femur Ortho c/s, appreciate assistance WBAT with  walker as able Scheduled Tylenol p.o. or suppository Post op DVT ppx per ortho - lovenox Per daughter, mostly wheelchair bound, but did use walker at times  Delirium Likely postop delirium in patient with advanced dementia.  Per son, she is oriented to self, family and person at baseline.  Independent with some ADL's.  She has no focal neurodeficit.  She has no fever or leukocytosis.  It seems she had postop urinary tension from talking to her son although this was not documented.  Foley catheter inserted.  UA with pyuria without bacteriuria.  Urine culture with E. coli.  This is difficult to interpret.  CXR negative. -Continue IV ceftriaxone for possible UTI -Schedule Tylenol -Delirium precautions. -Avoid or minimize sedating medications  Failure to thrive in adult/severe protein calorie malnutrition Body mass index is 13.13 kg/m.  She has very advanced dementia complicated by in-hospital delirium and refusal to take p.o.  She has significant comorbidity.  Poor prognosis.  DNR/DNI is appropriate.  -Palliative medicine following.  Fall against object Fell hitting hip on table and on ground with subsequent right hip and scapular fracture.  CT head without acute finding. She has multiple chronic thoracic and lumbar spine compression fractures without significant spinal or foraminal stenosis.  She has severe osteoporosis. -Pain control -Calcium and vitamin D -Bisphosphonate?  Hypokalemia and hypomagnesemia Replenish and recheck.  Acute postoperative anemia due to expected blood loss Recent Labs    02/26/21 1035 05/27/21 1431 05/28/21 0405 05/29/21 [id number] 05/30/21 0344 05/31/21 0333 06/01/21  0865 06/02/21 0354  HGB 13.7 12.6 10.1* 10.0* 8.1* 7.7* 7.5* 11.1*  Hgb reaching nadir at 7.5.  No obvious bleeding.  Transfused 1 unit.  -Monitor H&H, and transfuse to keep Hgb > 8.0 given history of CAD.   Osteoporosis She has multiple vertebral compression fractures of different ages.  Not  acute.  She is very malnourished -Vitamin D and calcium if she is willing to take p.o. -Doubt utility of bisphosphonate given her frailty and age  Closed right scapular fracture Likely due to fall.  Plain film with angulated fx at inferior aspect of R scapula Per orthopedics - sling when up, WBAT -> needs f/u with upper extremity specialist  Nonobstructive CAD LHC 10/2018 with mild nonobstructive CAD, mderate segmental LV systolic dysfunction in periapical pattern likely secondary to takotsubo syndrome -IV metoprolol until she is willing to take p.o. meds. -Transfuse to keep Hgb > 8.0.  Essential hypertension Blood pressure elevated.  Difficult to get accurate measurement in the setting of agitation/delirium -Increased IV metoprolol to 5 mg every 6 hours -Changed as needed metoprolol to as needed labetalol -Continue Avapro  Chest pain Difficult to discern due to advanced dementia.  She has history of CAD   Dysphagia Likely due to dementia.  I doubt she has mechanical dysphagia  History of cardiomyopathy Echo 05/2019 with EF 60-65%, grade 1 diastolic dysfunction.  Appears euvolemic on exam.  Not on diuretics at home. -Monitor fluid and respiratory status   Infestation by bed bug Bed bugs reported on arrival, none noted by EDP Will continue contact precautions for now and monitor closely  DNR (do not resuscitate) Noted on most form (not completely filled out - see media) and confirmed with son  Bladder distention Bladder scan and strict intake and output   Vertebral compression fracture ([name]) CT shows age-indeterminate and old compression fractures with no significant canal stenosis.  This could be due to underlying osteoporosis. -Pain control   DVT prophylaxis:  heparin injection 5,000 Units Start: 05/30/21 0800 SCDs Start: 05/29/21 1457  Code Status: DNR/DNI Family Communication: Updated patient's son over the phone. Level of care: Telemetry Status is:  Inpatient Remains inpatient appropriate because: Due to postop delirium, poor p.o. intake   Final disposition: SNF Consultants:  Orthopedic surgery Palliative medicine  Sch Meds:  Scheduled Meds:  acetaminophen  650 mg Oral Q6H WA   Or   acetaminophen  650 mg Rectal Q6H WA   Chlorhexidine Gluconate Cloth  6 each Topical Daily   docusate sodium  100 mg Oral BID   feeding supplement  237 mL Oral TID BM   gabapentin  100 mg Oral QHS   heparin injection (subcutaneous)  5,000 Units Subcutaneous Q12H   irbesartan  150 mg Oral Daily   lidocaine  1 patch Transdermal Q24H   metoprolol tartrate  5 mg Intravenous Q6H   multivitamin with minerals  1 tablet Oral Daily   pantoprazole  40 mg Oral Daily   [START ON 06/06/2021] thiamine injection  100 mg Intravenous Daily   Continuous Infusions:  cefTRIAXone (ROCEPHIN)  IV 1 g (06/02/21 0108)   dextrose 5% lactated ringers with KCl 20 mEq/L 60 mL/hr at 06/02/21 1341   thiamine injection 250 mg (06/02/21 0956)   PRN Meds:.haloperidol lactate, HYDROmorphone (DILAUDID) injection, labetalol, lip balm, menthol-cetylpyridinium **OR** phenol, metoCLOPramide **OR** metoCLOPramide (REGLAN) injection, naLOXone (NARCAN)  injection, ondansetron **OR** ondansetron (ZOFRAN) IV, oxyCODONE **OR** oxyCODONE  Antimicrobials: Anti-infectives (From admission, onward)    Start     Dose/Rate  Route Frequency Ordered Stop   06/01/21 0100  cefTRIAXone (ROCEPHIN) 1 g in sodium chloride 0.9 % 100 mL IVPB        1 g 200 mL/hr over 30 Minutes Intravenous Every 24 hours 05/31/21 2355     05/29/21 2200  ceFAZolin (ANCEF) IVPB 2g/100 mL premix        2 g 200 mL/hr over 30 Minutes Intravenous Every 12 hours 05/29/21 1456 05/29/21 2150   05/29/21 1150  ceFAZolin (ANCEF) 2-4 GM/100ML-% IVPB       Note to Pharmacy: Myrlene Broker M: cabinet override      05/29/21 1150 05/29/21 1316   05/29/21 0900  ceFAZolin (ANCEF) IVPB 2g/100 mL premix        2 g 200 mL/hr over 30 Minutes  Intravenous On call to O.R. 05/29/21 0803 05/29/21 1317        I have personally reviewed the following labs and images: CBC: Recent Labs  Lab 05/27/21 1431 05/28/21 0405 05/29/21 0641 05/30/21 0344 05/31/21 0333 06/01/21 0637 06/02/21 0354  WBC 5.8   < > 11.7* 7.6 7.7 4.3 7.1  NEUTROABS 4.5  --  10.1*  --  6.0 2.8  --   HGB 12.6   < > 10.0* 8.1* 7.7* 7.5* 11.1*  HCT 37.6   < > 31.4* 24.5* 23.2* 22.6* 32.7*  MCV 94.2   < > 95.4 93.9 95.5 95.8 91.1  PLT 269   < > 230 170 196 175 209   < > = values in this interval not displayed.   BMP &GFR Recent Labs  Lab 05/29/21 0542 05/30/21 0344 05/31/21 0333 06/01/21 0637 06/02/21 0354  NA 141 140 144 141 137  K 3.8 3.2* 3.7 3.0* 3.2*  CL 104 102 106 108 102  CO2 29 31 32 30 28  GLUCOSE 130* 157* 128* 99 100*  BUN 23 23 30* 17 10  CREATININE 0.59 0.63 0.56 0.44 0.37*  CALCIUM 8.7* 8.2* 8.6* 7.7* 7.9*  MG 2.3  --  2.1 1.7 1.9  PHOS 2.9  --  2.9 2.6 2.4*   Estimated Creatinine Clearance: 25.7 mL/min (A) (by C-G formula based on SCr of 0.37 mg/dL (L)). Liver & Pancreas: Recent Labs  Lab 05/27/21 1431 05/28/21 0405 05/29/21 0542 05/31/21 0333 06/01/21 0637 06/02/21 0354  AST 19 57* 37 21 19  --   ALT 17 35 30 11 9   --   ALKPHOS 98 90 89 66 63  --   BILITOT 0.5 0.7 1.0 0.4 0.7  --   PROT 6.3* 5.5* 6.3* 5.2* 4.7*  --   ALBUMIN 3.6 3.0* 3.3* 2.5* 2.4* 2.7*   No results for input(s): LIPASE, AMYLASE in the last 168 hours. No results for input(s): AMMONIA in the last 168 hours. Diabetic: No results for input(s): HGBA1C in the last 72 hours. No results for input(s): GLUCAP in the last 168 hours. Cardiac Enzymes: Recent Labs  Lab 05/27/21 1845 06/02/21 0354  CKTOTAL 97 927*   No results for input(s): PROBNP in the last 8760 hours. Coagulation Profile: No results for input(s): INR, PROTIME in the last 168 hours. Thyroid Function Tests: No results for input(s): TSH, T4TOTAL, FREET4, T3FREE, THYROIDAB in the last 72  hours. Lipid Profile: No results for input(s): CHOL, HDL, LDLCALC, TRIG, CHOLHDL, LDLDIRECT in the last 72 hours. Anemia Panel: No results for input(s): VITAMINB12, FOLATE, FERRITIN, TIBC, IRON, RETICCTPCT in the last 72 hours. Urine analysis:    Component Value Date/Time   COLORURINE YELLOW 05/31/2021  1832   APPEARANCEUR CLEAR 05/31/2021 1832   LABSPEC 1.012 05/31/2021 1832   PHURINE 6.0 05/31/2021 1832   GLUCOSEU NEGATIVE 05/31/2021 1832   HGBUR NEGATIVE 05/31/2021 1832   BILIRUBINUR NEGATIVE 05/31/2021 1832   KETONESUR NEGATIVE 05/31/2021 1832   PROTEINUR NEGATIVE 05/31/2021 1832   NITRITE POSITIVE (A) 05/31/2021 1832   LEUKOCYTESUR NEGATIVE 05/31/2021 1832   Sepsis Labs: Invalid input(s): PROCALCITONIN, LACTICIDVEN  Microbiology: Recent Results (from the past 240 hour(s))  MRSA Next Gen by PCR, Nasal     Status: None   Collection Time: 05/27/21 11:57 PM   Specimen: Nasal Mucosa; Nasal Swab  Result Value Ref Range Status   MRSA by PCR Next Gen NOT DETECTED NOT DETECTED Final    Comment: (NOTE) The GeneXpert MRSA Assay (FDA approved for NASAL specimens only), is one component of a comprehensive MRSA colonization surveillance program. It is not intended to diagnose MRSA infection nor to guide or monitor treatment for MRSA infections. Test performance is not FDA approved in patients less than 58 years old. Performed at [facility], [address] [place], [place] [address]   Surgical pcr screen     Status: None   Collection Time: 05/29/21  8:37 AM   Specimen: Nasal Mucosa; Nasal Swab  Result Value Ref Range Status   MRSA, PCR NEGATIVE NEGATIVE Final   Staphylococcus aureus NEGATIVE NEGATIVE Final    Comment: (NOTE) The Xpert SA Assay (FDA approved for NASAL specimens in patients 30 years of age and older), is one component of a comprehensive surveillance program. It is not intended to diagnose infection nor to guide or monitor  treatment. Performed at [facility], [address], [place], [place] [address]   Urine Culture     Status: Abnormal (Preliminary result)   Collection Time: 06/01/21 12:30 AM   Specimen: Urine, Clean Catch  Result Value Ref Range Status   Specimen Description   Final    URINE, CLEAN CATCH Performed at [facility], [address] [place], [place] [address]    Special Requests   Final    NONE Performed at [facility], [address], [place], [place] [address]    Culture (A)  Final    80,000 COLONIES/mL ESCHERICHIA COLI SUSCEPTIBILITIES TO FOLLOW Performed at [facility], [address] [place], [place] [address]    Report Status PENDING  Incomplete    Radiology Studies: No results found.    Lovette Merta T. Shourya Macpherson [facility]  If 7PM-7AM, please contact night-coverage [email or web address] 06/02/2021, 2:45 PM

## 2021-06-02 NOTE — Evaluation (Signed)
SLP Cancellation Note  Patient Details Name: Julie Durham MRN: [id number] DOB: [birth year]   Cancelled treatment:       Reason Eval/Treat Not Completed: Other (comment) (pt continues to decline po intake and medications frequently; will sign off, please reorder if desire; note palliative following pt)   Macario Golds 06/02/2021, 6:07 PM  Kathleen Lime, MS [facility] SLP [place] Office [phone] Pager [phone]

## 2021-06-02 NOTE — Progress Notes (Signed)
Occupational Therapy Treatment Patient Details Name: Julie Durham MRN: [id number] DOB: 1940-05-15 Today's Date: 06/02/2021   History of present illness Julie Durham is a 81 y.o. female presenting to the ED from [place] on 5/19 after a fall. Pt found to have Rt femoral neck fracture now s/p ORIF for IM nail. CT also revealed moderate compression of the acute or subacute L5 vertebral body, age indeterminate L3 compression fx, remote healed right pubic rami fx. PMH significant forsSevere osteoporosis with chronic thoracic compression fractures at 7 vertebral levels, HTN, CAD, OA, takotsubo syndrome.   OT comments  Patient continues to be agitated, confused and reporting pain. She screams when you touch her. She screams when you are not touching her. She cannot attend to a task. She does not follow commands. She is unable to perform a functional task. She does not tolerate extension of either leg - she is curled up in fetal position with heels near her buttocks. Therapist could not extend either knee past 90 degrees. Unsure if this is a contracture or patient severely resisting (per history patient able to walk last week). Patient continues to be total assist. Will attempt OT one more time to see if patient's agitation and confusion improve allowing more participation - as patient was functional prior to this. Currently rehab potential is guarded.   Recommendations for follow up therapy are one component of a multi-disciplinary discharge planning process, led by the attending physician.  Recommendations may be updated based on patient status, additional functional criteria and insurance authorization.    Follow Up Recommendations  Skilled nursing-short term rehab (<3 hours/day)    Assistance Recommended at Discharge Frequent or constant Supervision/Assistance  Patient can return home with the following  Two people to help with walking and/or transfers;Two people to help with  bathing/dressing/bathroom;Assistance with cooking/housework;Assistance with feeding;Direct supervision/assist for medications management;Direct supervision/assist for financial management;Assist for transportation;Help with stairs or ramp for entrance   Equipment Recommendations  None recommended by OT    Recommendations for Other Services      Precautions / Restrictions Precautions Precautions: Fall Precaution Comments: Pt with two recent falls Restrictions Weight Bearing Restrictions: Yes RLE Weight Bearing: Non weight bearing Other Position/Activity Restrictions: Pt with R scapular fx 1 week ago       Mobility Bed Mobility                    Transfers                         Balance                                           ADL either performed or assessed with clinical judgement   ADL Overall ADL's : Needs assistance/impaired Eating/Feeding: Total assistance   Grooming: Total assistance   Upper Body Bathing: Total assistance   Lower Body Bathing: Total assistance   Upper Body Dressing : Total assistance   Lower Body Dressing: Total assistance   Toilet Transfer: Total assistance   Toileting- Clothing Manipulation and Hygiene: Total assistance       Functional mobility during ADLs: Total assistance;+2 for physical assistance General ADL Comments: Patient contines to be lying on left side in fetal position with heels near buttocks. Does not tolerate movement of LEs to attempt mobility. She is screaming and resistant -  even screaming reporting pain even when not being touched. She could not consoled. She could not attend to a task. She could not follow a command. LLE knee could not be passively extended beyoung 90 degreees. R knee could not be extended to past 90 degrees.    Extremity/Trunk Assessment Upper Extremity Assessment Upper Extremity Assessment: Difficult to assess due to impaired cognition            Vision    Vision Assessment?: No apparent visual deficits   Perception     Praxis      Cognition Arousal/Alertness: Awake/alert Behavior During Therapy: Restless, Anxious Overall Cognitive Status: Difficult to assess                                 General Comments: patient not following directions, on Left side and in fetal position - hx of dementia        Exercises      Shoulder Instructions       General Comments      Pertinent Vitals/ Pain       Pain Assessment Pain Assessment: PAINAD Breathing: normal Negative Vocalization: occasional moan/groan, low speech, negative/disapproving quality Facial Expression: sad, frightened, frown Body Language: relaxed Consolability: unable to console, distract or reassure Pain Location: everywhere Pain Descriptors / Indicators: Aching, Grimacing, Operative site guarding, Moaning, Restless, Guarding Pain Intervention(s): RN gave pain meds during session  Home Living                                          Prior Functioning/Environment              Frequency  Min 2X/week        Progress Toward Goals  OT Goals(current goals can now be found in the care plan section)  Progress towards OT goals: Not progressing toward goals - comment  Acute Rehab OT Goals OT Goal Formulation: Patient unable to participate in goal setting Time For Goal Achievement: 06/13/21 Potential to Achieve Goals: Poor  Plan Discharge plan remains appropriate    Co-evaluation                 AM-PAC OT "6 Clicks" Daily Activity     Outcome Measure   Help from another person eating meals?: Total Help from another person taking care of personal grooming?: Total Help from another person toileting, which includes using toliet, bedpan, or urinal?: Total Help from another person bathing (including washing, rinsing, drying)?: Total Help from another person to put on and taking off regular upper body clothing?:  Total Help from another person to put on and taking off regular lower body clothing?: Total 6 Click Score: 6    End of Session    OT Visit Diagnosis: Pain;Other abnormalities of gait and mobility (R26.89) Pain - Right/Left: Right Pain - part of body: Leg   Activity Tolerance Patient limited by pain;Treatment limited secondary to agitation   Patient Left in bed;with call bell/phone within reach;with bed alarm set;with nursing/sitter in room   Nurse Communication Mobility status        Time: 1100-1108 OT Time Calculation (min): 8 min  Charges: OT General Charges $OT Visit: 1 Visit OT Treatments $Therapeutic Activity: 8-22 mins  Waldron Session, OTR/L Acute Care Rehab Services  Office [phone] Pager: [phone]   Kelli Churn 06/02/2021, 12:57  PM

## 2021-06-03 DIAGNOSIS — Z66 Do not resuscitate: Secondary | ICD-10-CM | POA: Diagnosis not present

## 2021-06-03 DIAGNOSIS — R627 Adult failure to thrive: Secondary | ICD-10-CM | POA: Diagnosis not present

## 2021-06-03 DIAGNOSIS — E43 Unspecified severe protein-calorie malnutrition: Secondary | ICD-10-CM | POA: Diagnosis not present

## 2021-06-03 DIAGNOSIS — Z515 Encounter for palliative care: Secondary | ICD-10-CM | POA: Diagnosis not present

## 2021-06-03 DIAGNOSIS — R410 Disorientation, unspecified: Secondary | ICD-10-CM | POA: Diagnosis not present

## 2021-06-03 DIAGNOSIS — S72001A Fracture of unspecified part of neck of right femur, initial encounter for closed fracture: Secondary | ICD-10-CM | POA: Diagnosis not present

## 2021-06-03 DIAGNOSIS — W1800XA Striking against unspecified object with subsequent fall, initial encounter: Secondary | ICD-10-CM | POA: Diagnosis not present

## 2021-06-03 LAB — CBC
HCT: 35.6 % — ABNORMAL LOW (ref 36.0–46.0)
Hemoglobin: 12 g/dL (ref 12.0–15.0)
MCH: 30.8 pg (ref 26.0–34.0)
MCHC: 33.7 g/dL (ref 30.0–36.0)
MCV: 91.5 fL (ref 80.0–100.0)
Platelets: 237 10*3/uL (ref 150–400)
RBC: 3.89 MIL/uL (ref 3.87–5.11)
RDW: 12.2 % (ref 11.5–15.5)
WBC: 7.8 10*3/uL (ref 4.0–10.5)
nRBC: 0 % (ref 0.0–0.2)

## 2021-06-03 LAB — URINE CULTURE: Culture: 80000 — AB

## 2021-06-03 LAB — RENAL FUNCTION PANEL
Albumin: 2.8 g/dL — ABNORMAL LOW (ref 3.5–5.0)
Anion gap: 8 (ref 5–15)
BUN: 8 mg/dL (ref 8–23)
CO2: 26 mmol/L (ref 22–32)
Calcium: 8.1 mg/dL — ABNORMAL LOW (ref 8.9–10.3)
Chloride: 101 mmol/L (ref 98–111)
Creatinine, Ser: 0.33 mg/dL — ABNORMAL LOW (ref 0.44–1.00)
GFR, Estimated: >60 mL/min (ref >60)
Glucose, Bld: 126 mg/dL — ABNORMAL HIGH (ref 70–99)
Phosphorus: 2.8 mg/dL (ref 2.5–4.6)
Potassium: 3.7 mmol/L (ref 3.5–5.1)
Sodium: 135 mmol/L (ref 135–145)

## 2021-06-03 LAB — CK: Total CK: 549 U/L — ABNORMAL HIGH (ref 38–234)

## 2021-06-03 LAB — MAGNESIUM: Magnesium: 1.9 mg/dL (ref 1.7–2.4)

## 2021-06-03 MED ORDER — CEFAZOLIN SODIUM-DEXTROSE 1-4 GM/50ML-% IV SOLN
1.0000 g | Freq: Two times a day (BID) | INTRAVENOUS | Status: DC
  Administered 2021-06-03 – 2021-06-05 (×5): 1 g via INTRAVENOUS
  Filled 2021-06-03 (×6): qty 50

## 2021-06-03 NOTE — Progress Notes (Signed)
Nutrition Follow-up  DOCUMENTATION CODES:   Severe malnutrition in context of chronic illness, Underweight  INTERVENTION:  - continue Ensure Plus High Protein TID. - weigh patient today.  NUTRITION DIAGNOSIS:   Severe Malnutrition related to chronic illness (dementia) as evidenced by severe fat depletion, severe muscle depletion. -ongoing  GOAL:   Patient will meet greater than or equal to 90% of their needs -unmet  MONITOR:   PO intake, Supplement acceptance, Labs, Weight trends, I & O's  ASSESSMENT:   81 y.o. female with medical history significant of osteoporosis with compression fractures, CAD, takutsubo's cardiomyopathy, hypertension and multiple other medical problems presenting after a fall. Admitted for right hip fracture.  Patient noted to be a/o to self only. Flow sheet documentation indicates 0% intake of all recorded meals over the past 5 days including breakfast this AM. Review of Ensure order indicates she has been accepting it ~50-75% of the time offered.  Able to talk with RN who shared that the only PO intake patient has had today was 1/2 cup of water and applesauce with PO meds.   Palliative Care following and last saw patient on 5/25.  She has not been weighed since admission on 5/19. Mild pitting edema to RLE documented in the edema section of flow sheet.    Labs reviewed; creatinine: 0.33 mg/dl, Ca: 8.1 mg/dl.  Medications reviewed; 100 mg colace BID, 1 tablet multivitamin with minerals/day, 40 mg oral protonix/day, 10 mEq IV KCl x4 runs 5/25, 250 mg IV thiamine/day x5 days started 5/24.  IVF; D5-LR-20 mEq IV KCl @ 60 ml/hr (245 kcal/24 hrs).    Diet Order:   Diet Order             Diet regular Room service appropriate? Yes; Fluid consistency: Thin  Diet effective now                   EDUCATION NEEDS:   Not appropriate for education at this time  Skin:  Skin Assessment: Skin Integrity Issues: Skin Integrity Issues::  Incisions Incisions: 5/21 right hip  Last BM:  5/24; smear today (5/26)  Height:   Ht Readings from Last 1 Encounters:  05/27/21 4\' 11"  (1.499 m)    Weight:   Wt Readings from Last 1 Encounters:  05/27/21 29.5 kg     BMI:  Body mass index is 13.13 kg/m.  Estimated Nutritional Needs:  Kcal:  1450-1650 Protein:  65-80g Fluid:  1.6L/day     05/29/21, MS, RD, LDN Registered Dietitian II Inpatient Clinical Nutrition RD pager # and on-call/weekend pager # available in AMION

## 2021-06-03 NOTE — Progress Notes (Signed)
Daily Progress Note   Patient Name: Julie Durham       Date: 06/03/2021 DOB: March 14, 1940  Age: 81 y.o. MRN#: [id number] Attending Physician: Almon Hercules, MD Primary Care Physician: Pcp, No Admit Date: 05/27/2021  Reason for Consultation/Follow-up: Establishing goals of care  Subjective:  Agitated when nursing staff is attempting cleaning/repositioning. Medications history noted.   Length of Stay: 7  Current Medications: Scheduled Meds:   acetaminophen  650 mg Oral Q6H WA   Or   acetaminophen  650 mg Rectal Q6H WA   Chlorhexidine Gluconate Cloth  6 each Topical Daily   docusate sodium  100 mg Oral BID   feeding supplement  237 mL Oral TID BM   gabapentin  100 mg Oral QHS   heparin injection (subcutaneous)  5,000 Units Subcutaneous Q12H   irbesartan  150 mg Oral Daily   lidocaine  1 patch Transdermal Q24H   metoprolol tartrate  5 mg Intravenous Q6H   multivitamin with minerals  1 tablet Oral Daily   pantoprazole  40 mg Oral Daily   [START ON 06/06/2021] thiamine injection  100 mg Intravenous Daily    Continuous Infusions:   ceFAZolin (ANCEF) IV     dextrose 5% lactated ringers with KCl 20 mEq/L 60 mL/hr at 06/03/21 4098   thiamine injection 250 mg (06/02/21 0956)    PRN Meds: haloperidol lactate, HYDROmorphone (DILAUDID) injection, labetalol, lip balm, menthol-cetylpyridinium **OR** phenol, metoCLOPramide **OR** metoCLOPramide (REGLAN) injection, naLOXone (NARCAN)  injection, ondansetron **OR** ondansetron (ZOFRAN) IV, oxyCODONE **OR** oxyCODONE  Physical Exam         Frail elderly lady Mildly agitated Regular work of breathing Abdomen not distended No edema Has dementia  Vital Signs: BP (!) 193/86 (BP Location: Right Arm)   Pulse 73   Temp 97.7 F (36.5 C)  (Oral)   Resp (!) 24   Ht 4\' 11"  (1.499 m)   Wt 29.5 kg   SpO2 100%   BMI 13.13 kg/m  SpO2: SpO2: 100 % O2 Device: O2 Device: Room Air O2 Flow Rate: O2 Flow Rate (L/min): 1 L/min  Intake/output summary:  Intake/Output Summary (Last 24 hours) at 06/03/2021 0940 Last data filed at 06/03/2021 0553 Gross per 24 hour  Intake 689 ml  Output 2000 ml  Net -1311 ml   LBM: Last BM Date :  06/01/21 Baseline Weight: Weight: 29.5 kg Most recent weight: Weight: 29.5 kg       Palliative Assessment/Data:      Patient Active Problem List   Diagnosis Date Noted   Hypokalemia and hypomagnesemia 06/01/2021   Hypokalemia 06/01/2021   Hypomagnesemia 06/01/2021   Delirium 05/30/2021   Acute postoperative anemia due to expected blood loss 05/30/2021   Bladder distention 05/29/2021   Closed displaced fracture of right femoral neck ([name]) 05/27/2021   Fall against object 05/27/2021   Dementia ([name]) 05/27/2021   History of cardiomyopathy 05/27/2021   Nonobstructive CAD 05/27/2021   Infestation by bed bug 05/27/2021   DNR (do not resuscitate) 05/27/2021   Failure to thrive in adult/severe protein calorie malnutrition 05/27/2021   Closed right scapular fracture 05/27/2021   Osteoporosis 05/27/2021   Febrile 08/07/2019   Acute lower UTI 01/04/2019   Acute cystitis without hematuria    Acute encephalopathy 01/03/2019   Protein-calorie malnutrition, severe 12/24/2018   Acute pain of right knee    Closed right tibial fracture 12/23/2018   Mild CAD 10/24/2018   S/P kyphoplasty 10/24/2018   Stress fracture of lumbar vertebra 10/24/2018   Systolic dysfunction 10/24/2018   Elevated troponin 10/24/2018   Chest pain 10/24/2018   Brain mass 10/24/2018   Takotsubo syndrome    Vertebral compression fracture ([name]) 10/22/2018   Hyperlipidemia 02/28/2017   Dysphagia 10/04/2016   Other bursal cyst, left elbow 10/04/2016   Pill esophagitis 10/04/2016   Infected insect bite 06/29/2016   Retained  foreign body in soft tissue 06/29/2016   Essential hypertension 11/18/2012   Palpitations 11/18/2012    Palliative Care Assessment & Plan   Patient Profile:    Assessment:  81 year old with a history of dementia who lives at [facility] facility since December 2020, admitted for right hip and a right scapular fracture after fall at nursing home.  She underwent IM fixation for right hip fracture on 5-21.  Palliative medicine team consulted because postop course has been complicated by delirium and to discuss broad goals of care in this context.  Has underlying history of dementia, coronary artery disease, Takotsubo cardiomyopathy, hypertension compression fractures recurrent falls and severe malnutrition. A palliative consult has been requested for ongoing goals of care discussions.   Delirium Severe protein calorie malnutrition Closed displaced R femoral neck fracture   Recommendations/Plan: Pain management with IV Dilaudid PRN. Also has Oxy IR PO PRN.  Low-dose Haldol IV as needed to be used only for severe uncontrolled agitation. Monitor overall hospital course and overall disease trajectory of illness for the next 48  hours or so.  If the patient has some degree of stabilization/recovery, increased participation with PT OT, improving oral intake, then consider discharge back to [place] with continuation of physical therapy and addition of palliative support.  If the patient has escalating symptom burden and no meaningful recovery in the next 2-3 days, then would consider discharge back to [place] with addition of hospice services.     Code Status:    Code Status Orders  (From admission, onward)           Start     Ordered   05/27/21 1958  Do not attempt resuscitation (DNR)  Continuous       Question Answer Comment  In the event of cardiac or respiratory ARREST Do not call a "code blue"   In the event of cardiac or respiratory ARREST Do not perform  Intubation, CPR, defibrillation or ACLS  In the event of cardiac or respiratory ARREST Use medication by any route, position, wound care, and other measures to relive pain and suffering. May use oxygen, suction and manual treatment of airway obstruction as needed for comfort.      05/27/21 1957           Code Status History     Date Active Date Inactive Code Status Order ID Comments User Context   01/08/2019 1716 01/09/2019 0521 DNR [id number]  Elease EtienneHongalgi, Anand D, MD Inpatient   01/04/2019 0542 01/08/2019 1716 Full Code [id number]  Eduard ClosKakrakandy, Arshad N, MD ED   12/23/2018 1755 12/25/2018 1709 Full Code [id number]  Bobette Mortiz, David Manuel, MD ED   10/22/2018 1104 10/24/2018 2017 Full Code [id number]  Derryl Harborostella, Darci CurrentVincent J, PA-C Inpatient      Advance Directive Documentation    Flowsheet Row Most Recent Value  Type of Advance Directive Out of facility DNR (pink MOST or yellow form)  [Pt has a Most Form that has DNR checked on it. It is at bedside]  Pre-existing out of facility DNR order (yellow form or pink MOST form) --  "MOST" Form in Place? --       Prognosis:  guarded  Discharge Planning: To Be Determined  Care plan was discussed with  IDT  Thank you for allowing the Palliative Medicine Team to assist in the care of this patient.   Time In: 9 Time Out: 9.25 Total Time 25 Prolonged Time Billed  no       Greater than 50%  of this time was spent counseling and coordinating care related to the above assessment and plan.  Rosalin HawkingZeba Lashay Osborne, MD  Please contact Palliative Medicine Team phone at [phone] for questions and concerns.

## 2021-06-03 NOTE — Progress Notes (Signed)
PROGRESS NOTE  Julie Durham ZOX:[id number] DOB: [birth year]   PCP: Pcp, No  Patient is from: Nursing home  [name]: 05/27/2021 LOS: 7  Chief complaints Chief Complaint  Patient presents with   Fall     Brief Narrative / Interim history: 81 year old female with history of dementia, CAD, Takotsubo cardiomyopathy, HTN, recurrent falls, compression fractures and severe malnutrition admitted for right hip and right scapular fracture after fall at nursing home.  She underwent IM fixation for right hip fracture on 5/21.  Postop course complicated by delirium.  Palliative medicine consulted.  Patient remains delirious.  Refusing to take p.o. medications or food.  Palliative medicine following    Subjective: Seen and examined earlier this morning.  Remains confused and delirious.  Has been spitting up pain medication.  Not eating or drinking.  She is screaming but denies pain.  Only oriented to self.  Objective: Vitals:   06/03/21 1151 06/03/21 1202 06/03/21 1238 06/03/21 1342  BP: (!) 192/96  (!) 170/92 (!) 178/115  Pulse: (!) 110  89 (!) 102  Resp:   18   Temp:      TempSrc:      SpO2:   99%   Weight:  31.7 kg    Height:        Examination:  GENERAL: Very frail. HEENT: MMM.  Vision and hearing grossly intact.  NECK: Supple.  No apparent JVD.  RESP:  No IWOB.  Fair aeration bilaterally. CVS:  RRR. Heart sounds normal.  ABD/GI/GU: BS+. Abd soft, NTND.  Foley catheter in place. MSK/EXT:  Moves extremities.  Significant muscle mass and subcu fat loss. SKIN: no apparent skin lesion or wound NEURO: Awake.  Oriented to self.  No apparent focal neuro deficit but limited exam. PSYCH: Confused and screaming.  Procedures:  5/21-IM fixation of right femoral fracture  Microbiology summarized: MRSA PCR screen negative. Urine culture with pansensitive E. coli  Assessment and Plan: * Closed displaced fracture of right femoral neck ([name]) 5/21-IM fixation right femur Ortho c/s,  appreciate assistance WBAT with walker as able Scheduled Tylenol p.o. or suppository Post op DVT ppx per ortho - lovenox Per family, mostly wheelchair bound, but did use walker at times at baseline  Delirium Likely postop delirium in patient with advanced dementia.  Per son, she is oriented to self, family and person at baseline.  Independent with some ADL's.  She has no focal neurodeficit.  She has no fever or leukocytosis.  It seems she had postop urinary tension from talking to her son although this was not documented.  Foley catheter inserted.  UA with pyuria without bacteriuria.  Urine culture with pansensitive E. coli.  This is difficult to interpret.  CXR negative. -De-escalate antibiotic to IV Ancef. -Scheduled Tylenol with as needed Dilaudid for pain control -Appreciate help by palliative-added low-dose Haldol and oxycodone. -Delirium precautions. -Avoid or minimize sedating medications  Failure to thrive in adult/severe protein calorie malnutrition Body mass index is 13.13 kg/m.  She has very advanced dementia complicated by in-hospital delirium and refusal to take p.o.  She has significant comorbidity.  Poor prognosis.  DNR/DNI is appropriate.  -Palliative medicine following.  Fall against object Fell hitting hip on table and on ground with subsequent right hip and scapular fracture.  CT head without acute finding. She has multiple chronic thoracic and lumbar spine compression fractures without significant spinal or foraminal stenosis.  She has severe osteoporosis. -Pain control -Calcium and vitamin D -Bisphosphonate?  Hypokalemia and hypomagnesemia Replenish and  recheck as appropriate  Acute postoperative anemia due to expected blood loss Recent Labs    02/26/21 1035 05/27/21 1431 05/28/21 0405 05/29/21 0641 05/30/21 0344 05/31/21 0333 06/01/21 0637 06/02/21 0354 06/03/21 0416  HGB 13.7 12.6 10.1* 10.0* 8.1* 7.7* 7.5* 11.1* 12.0  Hgb reaching nadir at 7.5.  No  obvious bleeding.  Transfused 1 unit.  -Monitor H&H, and transfuse to keep Hgb > 8.0 given history of CAD.   Osteoporosis She has multiple vertebral compression fractures of different ages.  Not acute.  She is very malnourished -Vitamin D and calcium if she is willing to take p.o. -Doubt utility of bisphosphonate given her frailty and age  Closed right scapular fracture Likely due to fall.  Plain film with angulated fx at inferior aspect of R scapula Per orthopedics - sling when up, WBAT -> needs f/u with upper extremity specialist  Nonobstructive CAD LHC 10/2018 with mild nonobstructive CAD, mderate segmental LV systolic dysfunction in periapical pattern likely secondary to takotsubo syndrome -IV metoprolol until she is willing to take p.o. meds. -Transfuse to keep Hgb > 8.0.  Uncontrolled hypertension Blood pressure elevated.  Difficult to get accurate measurement in the setting of agitation/delirium.  Also not taking his Avapro. -Continue IV metoprolol to 5 mg every 6 hours -Labetalol as needed-need to use this more -Continue Avapro-encouraged to take  Chest pain Difficult to discern due to advanced dementia.  She has history of CAD   Dysphagia Likely due to dementia.  I doubt she has mechanical dysphagia  History of cardiomyopathy Echo 05/2019 with EF 60-65%, grade 1 diastolic dysfunction.  Appears euvolemic on exam.  Not on diuretics at home. -Monitor fluid and respiratory status   Infestation by bed bug Bed bugs reported on arrival, none noted by EDP Will continue contact precautions for now and monitor closely  DNR (do not resuscitate) Noted on most form (not completely filled out - see media) and confirmed with son  Bladder distention Bladder scan and strict intake and output   Vertebral compression fracture ([name]) CT shows age-indeterminate and old compression fractures with no significant canal stenosis.  This could be due to underlying osteoporosis. -Pain  control   DVT prophylaxis:  heparin injection 5,000 Units Start: 05/30/21 0800 SCDs Start: 05/29/21 1457  Code Status: DNR/DNI Family Communication: Updated patient's son over the phone on 5/25.  Level of care: Med-Surg Status is: Inpatient Remains inpatient appropriate because: Due to postop delirium, poor p.o. intake   Final disposition: SNF Consultants:  Orthopedic surgery Palliative medicine  Sch Meds:  Scheduled Meds:  acetaminophen  650 mg Oral Q6H WA   Or   acetaminophen  650 mg Rectal Q6H WA   Chlorhexidine Gluconate Cloth  6 each Topical Daily   docusate sodium  100 mg Oral BID   feeding supplement  237 mL Oral TID BM   gabapentin  100 mg Oral QHS   heparin injection (subcutaneous)  5,000 Units Subcutaneous Q12H   irbesartan  150 mg Oral Daily   lidocaine  1 patch Transdermal Q24H   metoprolol tartrate  5 mg Intravenous Q6H   multivitamin with minerals  1 tablet Oral Daily   pantoprazole  40 mg Oral Daily   [START ON 06/06/2021] thiamine injection  100 mg Intravenous Daily   Continuous Infusions:   ceFAZolin (ANCEF) IV     dextrose 5% lactated ringers with KCl 20 mEq/L 60 mL/hr at 06/03/21 0922   thiamine injection 250 mg (06/03/21 1154)   PRN Meds:.haloperidol lactate,  HYDROmorphone (DILAUDID) injection, labetalol, lip balm, menthol-cetylpyridinium **OR** phenol, metoCLOPramide **OR** metoCLOPramide (REGLAN) injection, naLOXone (NARCAN)  injection, ondansetron **OR** ondansetron (ZOFRAN) IV, oxyCODONE **OR** oxyCODONE  Antimicrobials: Anti-infectives (From admission, onward)    Start     Dose/Rate Route Frequency Ordered Stop   06/03/21 2200  ceFAZolin (ANCEF) IVPB 1 g/50 mL premix        1 g 100 mL/hr over 30 Minutes Intravenous Every 12 hours 06/03/21 0905     06/01/21 0100  cefTRIAXone (ROCEPHIN) 1 g in sodium chloride 0.9 % 100 mL IVPB  Status:  Discontinued        1 g 200 mL/hr over 30 Minutes Intravenous Every 24 hours 05/31/21 2355 06/03/21 0904    05/29/21 2200  ceFAZolin (ANCEF) IVPB 2g/100 mL premix        2 g 200 mL/hr over 30 Minutes Intravenous Every 12 hours 05/29/21 1456 05/29/21 2150   05/29/21 1150  ceFAZolin (ANCEF) 2-4 GM/100ML-% IVPB       Note to Pharmacy: Myrlene Broker M: cabinet override      05/29/21 1150 05/29/21 1316   05/29/21 0900  ceFAZolin (ANCEF) IVPB 2g/100 mL premix        2 g 200 mL/hr over 30 Minutes Intravenous On call to O.R. 05/29/21 0803 05/29/21 1317        I have personally reviewed the following labs and images: CBC: Recent Labs  Lab 05/29/21 0641 05/30/21 0344 05/31/21 0333 06/01/21 0637 06/02/21 0354 06/03/21 0416  WBC 11.7* 7.6 7.7 4.3 7.1 7.8  NEUTROABS 10.1*  --  6.0 2.8  --   --   HGB 10.0* 8.1* 7.7* 7.5* 11.1* 12.0  HCT 31.4* 24.5* 23.2* 22.6* 32.7* 35.6*  MCV 95.4 93.9 95.5 95.8 91.1 91.5  PLT 230 170 196 175 209 237   BMP &GFR Recent Labs  Lab 05/29/21 0542 05/30/21 0344 05/31/21 0333 06/01/21 0637 06/02/21 0354 06/03/21 0416  NA 141 140 144 141 137 135  K 3.8 3.2* 3.7 3.0* 3.2* 3.7  CL 104 102 106 108 102 101  CO2 29 31 32 30 28 26   GLUCOSE 130* 157* 128* 99 100* 126*  BUN 23 23 30* 17 10 8   CREATININE 0.59 0.63 0.56 0.44 0.37* 0.33*  CALCIUM 8.7* 8.2* 8.6* 7.7* 7.9* 8.1*  MG 2.3  --  2.1 1.7 1.9 1.9  PHOS 2.9  --  2.9 2.6 2.4* 2.8   Estimated Creatinine Clearance: 27.6 mL/min (A) (by C-G formula based on SCr of 0.33 mg/dL (L)). Liver & Pancreas: Recent Labs  Lab 05/28/21 0405 05/29/21 0542 05/31/21 0333 06/01/21 0637 06/02/21 0354 06/03/21 0416  AST 57* 37 21 19  --   --   ALT 35 30 11 9   --   --   ALKPHOS 90 89 66 63  --   --   BILITOT 0.7 1.0 0.4 0.7  --   --   PROT 5.5* 6.3* 5.2* 4.7*  --   --   ALBUMIN 3.0* 3.3* 2.5* 2.4* 2.7* 2.8*   No results for input(s): LIPASE, AMYLASE in the last 168 hours. No results for input(s): AMMONIA in the last 168 hours. Diabetic: No results for input(s): HGBA1C in the last 72 hours. No results for input(s):  GLUCAP in the last 168 hours. Cardiac Enzymes: Recent Labs  Lab 05/27/21 1845 06/02/21 0354 06/03/21 0416  CKTOTAL 97 927* 549*   No results for input(s): PROBNP in the last 8760 hours. Coagulation Profile: No results for input(s): INR,  PROTIME in the last 168 hours. Thyroid Function Tests: No results for input(s): TSH, T4TOTAL, FREET4, T3FREE, THYROIDAB in the last 72 hours. Lipid Profile: No results for input(s): CHOL, HDL, LDLCALC, TRIG, CHOLHDL, LDLDIRECT in the last 72 hours. Anemia Panel: No results for input(s): VITAMINB12, FOLATE, FERRITIN, TIBC, IRON, RETICCTPCT in the last 72 hours. Urine analysis:    Component Value Date/Time   COLORURINE YELLOW 05/31/2021 1832   APPEARANCEUR CLEAR 05/31/2021 1832   LABSPEC 1.012 05/31/2021 1832   PHURINE 6.0 05/31/2021 1832   GLUCOSEU NEGATIVE 05/31/2021 1832   HGBUR NEGATIVE 05/31/2021 1832   BILIRUBINUR NEGATIVE 05/31/2021 1832   KETONESUR NEGATIVE 05/31/2021 1832   PROTEINUR NEGATIVE 05/31/2021 1832   NITRITE POSITIVE (A) 05/31/2021 1832   LEUKOCYTESUR NEGATIVE 05/31/2021 1832   Sepsis Labs: Invalid input(s): PROCALCITONIN, LACTICIDVEN  Microbiology: Recent Results (from the past 240 hour(s))  MRSA Next Gen by PCR, Nasal     Status: None   Collection Time: 05/27/21 11:57 PM   Specimen: Nasal Mucosa; Nasal Swab  Result Value Ref Range Status   MRSA by PCR Next Gen NOT DETECTED NOT DETECTED Final    Comment: (NOTE) The GeneXpert MRSA Assay (FDA approved for NASAL specimens only), is one component of a comprehensive MRSA colonization surveillance program. It is not intended to diagnose MRSA infection nor to guide or monitor treatment for MRSA infections. Test performance is not FDA approved in patients less than 40 years old. Performed at [facility], [address], [place], [place] [address]   Surgical pcr screen     Status: None   Collection Time: 05/29/21  8:37 AM   Specimen: Nasal Mucosa;  Nasal Swab  Result Value Ref Range Status   MRSA, PCR NEGATIVE NEGATIVE Final   Staphylococcus aureus NEGATIVE NEGATIVE Final    Comment: (NOTE) The Xpert SA Assay (FDA approved for NASAL specimens in patients 52 years of age and older), is one component of a comprehensive surveillance program. It is not intended to diagnose infection nor to guide or monitor treatment. Performed at [facility], [address] [place], [place] [address]   Urine Culture     Status: Abnormal   Collection Time: 06/01/21 12:30 AM   Specimen: Urine, Clean Catch  Result Value Ref Range Status   Specimen Description   Final    URINE, CLEAN CATCH Performed at [facility], [address], [place], [place] [address]    Special Requests   Final    NONE Performed at [facility], [address] [place], [place] [address]    Culture 80,000 COLONIES/mL ESCHERICHIA COLI (A)  Final   Report Status 06/03/2021 FINAL  Final   Organism ID, Bacteria ESCHERICHIA COLI (A)  Final      Susceptibility   Escherichia coli - MIC*    AMPICILLIN 8 SENSITIVE Sensitive     CEFAZOLIN <=4 SENSITIVE Sensitive     CEFEPIME <=0.12 SENSITIVE Sensitive     CEFTRIAXONE <=0.25 SENSITIVE Sensitive     CIPROFLOXACIN <=0.25 SENSITIVE Sensitive     GENTAMICIN <=1 SENSITIVE Sensitive     IMIPENEM <=0.25 SENSITIVE Sensitive     NITROFURANTOIN <=16 SENSITIVE Sensitive     TRIMETH/SULFA <=20 SENSITIVE Sensitive     AMPICILLIN/SULBACTAM 4 SENSITIVE Sensitive     PIP/TAZO <=4 SENSITIVE Sensitive     * 80,000 COLONIES/mL ESCHERICHIA COLI    Radiology Studies: No results found.    Kamala Kolton T. Carlita Whitcomb [facility]  If  7PM-7AM, please contact night-coverage [email or web address] 06/03/2021, 3:02 PM

## 2021-06-03 NOTE — TOC Progression Note (Signed)
Transition of Care [facility]) - Progression Note    Patient Details  Name: Julie Durham MRN: [id number] Date of Birth: Feb 01, 1940  Transition of Care [facility]) CM/SW Contact  Golda Acre, RN Phone Number: 06/03/2021, 7:47 AM  Clinical Narrative:    Patient from [facility], following for dc back to facility.   Expected Discharge Plan: Skilled Nursing Facility    Expected Discharge Plan and Services Expected Discharge Plan: Skilled Nursing Facility                                               Social Determinants of Health (SDOH) Interventions    Readmission Risk Interventions     View : No data to display.

## 2021-06-04 DIAGNOSIS — Z515 Encounter for palliative care: Secondary | ICD-10-CM | POA: Diagnosis not present

## 2021-06-04 DIAGNOSIS — Z66 Do not resuscitate: Secondary | ICD-10-CM | POA: Diagnosis not present

## 2021-06-04 DIAGNOSIS — R410 Disorientation, unspecified: Secondary | ICD-10-CM | POA: Diagnosis not present

## 2021-06-04 DIAGNOSIS — E43 Unspecified severe protein-calorie malnutrition: Secondary | ICD-10-CM | POA: Diagnosis not present

## 2021-06-04 DIAGNOSIS — W1800XA Striking against unspecified object with subsequent fall, initial encounter: Secondary | ICD-10-CM | POA: Diagnosis not present

## 2021-06-04 DIAGNOSIS — S72001A Fracture of unspecified part of neck of right femur, initial encounter for closed fracture: Secondary | ICD-10-CM | POA: Diagnosis not present

## 2021-06-04 DIAGNOSIS — R627 Adult failure to thrive: Secondary | ICD-10-CM | POA: Diagnosis not present

## 2021-06-04 LAB — CBC
HCT: 31.2 % — ABNORMAL LOW (ref 36.0–46.0)
Hemoglobin: 10.4 g/dL — ABNORMAL LOW (ref 12.0–15.0)
MCH: 30.9 pg (ref 26.0–34.0)
MCHC: 33.3 g/dL (ref 30.0–36.0)
MCV: 92.6 fL (ref 80.0–100.0)
Platelets: 286 10*3/uL (ref 150–400)
RBC: 3.37 MIL/uL — ABNORMAL LOW (ref 3.87–5.11)
RDW: 12 % (ref 11.5–15.5)
WBC: 7.1 10*3/uL (ref 4.0–10.5)
nRBC: 0 % (ref 0.0–0.2)

## 2021-06-04 LAB — RENAL FUNCTION PANEL
Albumin: 2.5 g/dL — ABNORMAL LOW (ref 3.5–5.0)
Anion gap: 6 (ref 5–15)
BUN: 9 mg/dL (ref 8–23)
CO2: 28 mmol/L (ref 22–32)
Calcium: 7.9 mg/dL — ABNORMAL LOW (ref 8.9–10.3)
Chloride: 100 mmol/L (ref 98–111)
Creatinine, Ser: 0.31 mg/dL — ABNORMAL LOW (ref 0.44–1.00)
GFR, Estimated: >60 mL/min (ref >60)
Glucose, Bld: 117 mg/dL — ABNORMAL HIGH (ref 70–99)
Phosphorus: 3.2 mg/dL (ref 2.5–4.6)
Potassium: 3.2 mmol/L — ABNORMAL LOW (ref 3.5–5.1)
Sodium: 134 mmol/L — ABNORMAL LOW (ref 135–145)

## 2021-06-04 LAB — MAGNESIUM: Magnesium: 1.9 mg/dL (ref 1.7–2.4)

## 2021-06-04 MED ORDER — POTASSIUM CHLORIDE 10 MEQ/100ML IV SOLN
10.0000 meq | INTRAVENOUS | Status: AC
  Administered 2021-06-04 (×4): 10 meq via INTRAVENOUS
  Filled 2021-06-04 (×4): qty 100

## 2021-06-04 NOTE — Progress Notes (Signed)
Daily Progress Note   Patient Name: Julie Durham       Date: 06/04/2021 DOB: Sep 10, 1940  Age: 81 y.o. MRN#: [id number] Attending Physician: Almon Hercules, MD Primary Care Physician: Pcp, No Admit Date: 05/27/2021  Reason for Consultation/Follow-up: Establishing goals of care  Subjective:  Agitated easily when turned or repositioned, laying in bed, son arrived at bedside.   Medications history noted.   Length of Stay: 8  Current Medications: Scheduled Meds:   acetaminophen  650 mg Oral Q6H WA   Or   acetaminophen  650 mg Rectal Q6H WA   Chlorhexidine Gluconate Cloth  6 each Topical Daily   docusate sodium  100 mg Oral BID   feeding supplement  237 mL Oral TID BM   gabapentin  100 mg Oral QHS   heparin injection (subcutaneous)  5,000 Units Subcutaneous Q12H   irbesartan  150 mg Oral Daily   lidocaine  1 patch Transdermal Q24H   metoprolol tartrate  5 mg Intravenous Q6H   multivitamin with minerals  1 tablet Oral Daily   pantoprazole  40 mg Oral Daily   [START ON 06/06/2021] thiamine injection  100 mg Intravenous Daily    Continuous Infusions:   ceFAZolin (ANCEF) IV 1 g (06/04/21 0926)   dextrose 5% lactated ringers with KCl 20 mEq/L 60 mL/hr at 06/04/21 0154   potassium chloride 10 mEq (06/04/21 1228)   thiamine injection 250 mg (06/04/21 1025)    PRN Meds: haloperidol lactate, HYDROmorphone (DILAUDID) injection, labetalol, lip balm, menthol-cetylpyridinium **OR** phenol, metoCLOPramide **OR** metoCLOPramide (REGLAN) injection, naLOXone (NARCAN)  injection, ondansetron **OR** ondansetron (ZOFRAN) IV, oxyCODONE **OR** oxyCODONE  Physical Exam         Frail elderly lady easily agitated Regular work of breathing Abdomen not distended No edema Has dementia  Vital  Signs: BP (!) 144/86 (BP Location: Right Arm)   Pulse 68   Temp 98.5 F (36.9 C) (Oral)   Resp 14   Ht 4\' 11"  (1.499 m)   Wt 31.7 kg   SpO2 100%   BMI 14.12 kg/m  SpO2: SpO2: 100 % O2 Device: O2 Device: Nasal Cannula O2 Flow Rate: O2 Flow Rate (L/min): 1 L/min  Intake/output summary:  Intake/Output Summary (Last 24 hours) at 06/04/2021 1250 Last data filed at 06/04/2021 0415 Gross per 24 hour  Intake 1519.68 ml  Output 1400 ml  Net 119.68 ml    LBM: Last BM Date : 06/04/21 Baseline Weight: Weight: 29.5 kg Most recent weight: Weight: 31.7 kg       Palliative Assessment/Data:      Patient Active Problem List   Diagnosis Date Noted   Hypokalemia and hypomagnesemia 06/01/2021   Hypokalemia 06/01/2021   Hypomagnesemia 06/01/2021   Delirium 05/30/2021   Acute postoperative anemia due to expected blood loss 05/30/2021   Bladder distention 05/29/2021   Closed displaced fracture of right femoral neck ([name]) 05/27/2021   Fall against object 05/27/2021   Dementia ([name]) 05/27/2021   History of cardiomyopathy 05/27/2021   Nonobstructive CAD 05/27/2021   Infestation by bed bug 05/27/2021   DNR (do not resuscitate) 05/27/2021   Failure to thrive in adult/severe protein calorie malnutrition 05/27/2021   Closed right scapular fracture 05/27/2021   Osteoporosis 05/27/2021   Febrile 08/07/2019   Acute lower UTI 01/04/2019   Acute cystitis without hematuria    Acute encephalopathy 01/03/2019   Protein-calorie malnutrition, severe 12/24/2018   Acute pain of right knee    Closed right tibial fracture 12/23/2018   Mild CAD 10/24/2018   S/P kyphoplasty 10/24/2018   Stress fracture of lumbar vertebra 10/24/2018   Systolic dysfunction 10/24/2018   Elevated troponin 10/24/2018   Chest pain 10/24/2018   Brain mass 10/24/2018   Takotsubo syndrome    Vertebral compression fracture ([name]) 10/22/2018   Hyperlipidemia 02/28/2017   Dysphagia 10/04/2016   Other bursal cyst, left elbow  10/04/2016   Pill esophagitis 10/04/2016   Infected insect bite 06/29/2016   Retained foreign body in soft tissue 06/29/2016   Uncontrolled hypertension 11/18/2012   Palpitations 11/18/2012    Palliative Care Assessment & Plan   Patient Profile:    Assessment:  81 year old with a history of dementia who [facility] December [facility], admitted for right hip and a right scapular fracture after fall at nursing home.  She underwent IM fixation for right hip fracture on 5-21.  Palliative medicine team consulted because postop course has been complicated by delirium and to discuss broad goals of care in this context.  Has underlying history of dementia, coronary artery disease, Takotsubo cardiomyopathy, hypertension compression fractures recurrent falls and severe malnutrition. A palliative consult has been requested for ongoing goals of care discussions.   Delirium Severe protein calorie malnutrition Closed displaced R femoral neck fracture   Recommendations/Plan: Pain management with IV Dilaudid PRN. Also has Oxy IR PO PRN.  Low-dose Haldol IV as needed to be used only for severe uncontrolled agitation. Will likely benefit from residential hospice due to increasing pain, poor PO intake. Son will discuss with sister, PMT to follow up again on 06-05-21.    Code Status:    Code Status Orders  (From admission, onward)           Start     Ordered   05/27/21 1958  Do not attempt resuscitation (DNR)  Continuous       Question Answer Comment  In the event of cardiac or respiratory ARREST Do not call a "code blue"   In the event of cardiac or respiratory ARREST Do not perform Intubation, CPR, defibrillation or ACLS   In the event of cardiac or respiratory ARREST Use medication by any route, position, wound care, and other measures to relive pain and suffering. May use oxygen, suction and manual treatment of airway obstruction as needed for comfort.  05/27/21  1957           Code Status History     Date Active Date Inactive Code Status Order ID Comments User Context   01/08/2019 1716 01/09/2019 0521 DNR [id number]  Elease Etienne, MD Inpatient   01/04/2019 0542 01/08/2019 1716 Full Code [id number]  Eduard Clos, MD ED   12/23/2018 1755 12/25/2018 1709 Full Code [id number]  Bobette Mo, MD ED   10/22/2018 1104 10/24/2018 2017 Full Code [id number]  [place]l [place] Darci Current, PA-C Inpatient      Advance Directive Documentation    Flowsheet Row Most Recent Value  Type of Advance Directive Out of facility DNR (pink MOST or yellow form)  [Pt has a Most Form that has DNR checked on it. It is at bedside]  Pre-existing out of facility DNR order (yellow form or pink MOST form) --  "MOST" Form in Place? --       Prognosis:  Days to less than 2 weeks in my opinion.   Discharge Planning: To Be Determined  Care plan was discussed with  IDT  Thank you for allowing the Palliative Medicine Team to assist in the care of this patient.   Time In: 1300 Time Out: 1335 Total Time 35 Prolonged Time Billed  no       Greater than 50%  of this time was spent counseling and coordinating care related to the above assessment and plan.  Rosalin Hawking, MD  Please contact Palliative Medicine Team phone at [phone] for questions and concerns.

## 2021-06-04 NOTE — Progress Notes (Signed)
Physical Therapy Discharge Patient Details Name: Julie Durham MRN: [id number] DOB: 08-03-1940 Today's Date: 06/04/2021 Time:  -     Patient discharged from PT services secondary to medical decline - Palliative medicine recommending Residential Hospice  possibly. Patient requiring multiple medications to attempt patient comfort, patient not eating.  Please see latest therapy progress note for current level of functioning and progress toward goals.    Progress and discharge plan discussed with patient and/or caregiver: Patient unable to participate in discharge planning and no caregivers available  GP    Blanchard Kelch PT Acute Rehabilitation Services Pager [phone] Office [phone]  Julie Durham 06/04/2021, 2:22 PM

## 2021-06-04 NOTE — Progress Notes (Signed)
PROGRESS NOTE  Julie Durham LFY:[id number] DOB: Jan 25, 1940   PCP: Pcp, No  Patient is from: Nursing home  [name]: 05/27/2021 LOS: 8  Chief complaints Chief Complaint  Patient presents with   Fall     Brief Narrative / Interim history: 81 year old female with history of dementia, CAD, Takotsubo cardiomyopathy, HTN, recurrent falls, compression fractures and severe malnutrition admitted for right hip and right scapular fracture after fall at nursing home.  She underwent IM fixation for right hip fracture on 5/21.  Postop course complicated by delirium.  Palliative medicine consulted.  Patient remains delirious.  Refusing to take p.o. medications or food.  Palliative medicine following    Subjective: Seen and examined earlier this morning.  Remains confused and delirious.  Refusing to take p.o. reports pain all over.  Not able to localize.  Objective: Vitals:   06/03/21 1816 06/03/21 1941 06/04/21 0414 06/04/21 1227  BP: (!) 169/88 (!) 171/78 (!) 153/95 (!) 144/86  Pulse: 76 81 78 68  Resp:  20 18 14   Temp:  97.6 F (36.4 C) 98.4 F (36.9 C) 98.5 F (36.9 C)  TempSrc:  Oral Oral Oral  SpO2:  99% 99% 100%  Weight:      Height:        Examination:  GENERAL: Very frail. HEENT: MMM.  Vision and hearing grossly intact.  NECK: Supple.  No apparent JVD.  RESP:  No IWOB.  Fair aeration bilaterally. CVS:  RRR. Heart sounds normal.  ABD/GI/GU: BS+. Abd soft, NTND.  MSK/EXT: Significant muscle mass and subcu fat loss.  Significant thoracic kyphosis. SKIN: no apparent skin lesion or wound NEURO: Awake but confused.  No apparent focal neuro deficit but limited exam. PSYCH: Confused and anxious looking  Procedures:  5/21-IM fixation of right femoral fracture  Microbiology summarized: MRSA PCR screen negative. Urine culture with pansensitive E. coli  Assessment and Plan: * Closed displaced fracture of right femoral neck ([name]) 5/21-IM fixation right femur Ortho c/s,  appreciate assistance WBAT with walker as able Scheduled Tylenol p.o. or suppository Post op DVT ppx per ortho - lovenox Per family, mostly wheelchair bound, but did use walker at times at baseline  Delirium Likely postop delirium in patient with advanced dementia.  No improvement despite interventions.  -Treat treatable causes as able -Pain control and delirium precautions -Palliative medicine following-recommending residential hospice  Failure to thrive in adult/severe protein calorie malnutrition Body mass index is 13.13 kg/m.  She has very advanced dementia complicated by in-hospital delirium and refusal to take p.o.  She has significant comorbidity.  Poor prognosis.  DNR/DNI is appropriate.  -Palliative medicine following and recommending residential hospice  Fall against object 11-04-2001 hitting hip on table and on ground with subsequent right hip and scapular fracture.  -Pain control  Hypokalemia and hypomagnesemia Replenish and recheck as appropriate  Acute postoperative anemia due to expected blood loss Recent Labs    02/26/21 1035 05/27/21 1431 05/28/21 0405 05/29/21 0641 05/30/21 0344 05/31/21 0333 06/01/21 0637 06/02/21 0354 06/03/21 0416 06/04/21 0334  HGB 13.7 12.6 10.1* 10.0* 8.1* 7.7* 7.5* 11.1* 12.0 10.4*  Hgb reaching nadir at 7.5.  No obvious bleeding.  Transfused 1 unit.  -Monitor H&H, and transfuse to keep Hgb > 8.0 given history of CAD.   Osteoporosis She has multiple vertebral compression fractures of different ages.  Not acute.  She is very malnourished -Doubt utility of treatment given grim prognosis  Closed right scapular fracture Likely due to fall.  Plain film with angulated  fx at inferior aspect of R scapula Per orthopedics - sling when up, WBAT -> needs f/u with upper extremity specialist  Nonobstructive CAD LHC 10/2018 with mild nonobstructive CAD, mderate segmental LV systolic dysfunction in periapical pattern likely secondary to takotsubo  syndrome -IV metoprolol until she is willing to take p.o. meds. -Transfuse to keep Hgb > 8.0.  Uncontrolled hypertension Improved.  Difficult to get accurate measurement in the setting of agitation/delirium.  Also not taking his Avapro. -Continue IV metoprolol to 5 mg every 6 hours -Labetalol as needed-need to use this more -Continue Avapro-encouraged to take  Chest pain Difficult to discern due to advanced dementia.  She has history of CAD   Dysphagia Likely due to dementia.  I doubt she has mechanical dysphagia  History of cardiomyopathy Echo 05/2019 with EF 60-65%, grade 1 diastolic dysfunction.  Appears euvolemic on exam.  Not on diuretics at home. -Monitor fluid and respiratory status   Infestation by bed bug Bed bugs reported on arrival, none noted by EDP Will continue contact precautions for now and monitor closely  DNR (do not resuscitate) Noted on most form (not completely filled out - see media) and confirmed with son  Bladder distention Continue indwelling Foley catheter   Vertebral compression fracture ([name]) CT shows age-indeterminate and old compression fractures with no significant canal stenosis.  This could be due to underlying osteoporosis. -Pain control   DVT prophylaxis:  heparin injection 5,000 Units Start: 05/30/21 0800 SCDs Start: 05/29/21 1457  Code Status: DNR/DNI Family Communication: Updated patient's son at bedside. Level of care: Med-Surg Status is: Inpatient Remains inpatient appropriate because: Due to postop delirium, poor p.o. intake   Final disposition: residential hospice versus SNF Consultants:  Orthopedic surgery Palliative medicine  Sch Meds:  Scheduled Meds:  acetaminophen  650 mg Oral Q6H WA   Or   acetaminophen  650 mg Rectal Q6H WA   Chlorhexidine Gluconate Cloth  6 each Topical Daily   docusate sodium  100 mg Oral BID   feeding supplement  237 mL Oral TID BM   gabapentin  100 mg Oral QHS   heparin injection  (subcutaneous)  5,000 Units Subcutaneous Q12H   irbesartan  150 mg Oral Daily   lidocaine  1 patch Transdermal Q24H   metoprolol tartrate  5 mg Intravenous Q6H   multivitamin with minerals  1 tablet Oral Daily   pantoprazole  40 mg Oral Daily   [START ON 06/06/2021] thiamine injection  100 mg Intravenous Daily   Continuous Infusions:   ceFAZolin (ANCEF) IV 1 g (06/04/21 0926)   dextrose 5% lactated ringers with KCl 20 mEq/L 60 mL/hr at 06/04/21 0154   potassium chloride 10 mEq (06/04/21 1532)   thiamine injection 250 mg (06/04/21 1025)   PRN Meds:.haloperidol lactate, HYDROmorphone (DILAUDID) injection, labetalol, lip balm, menthol-cetylpyridinium **OR** phenol, metoCLOPramide **OR** metoCLOPramide (REGLAN) injection, naLOXone (NARCAN)  injection, ondansetron **OR** ondansetron (ZOFRAN) IV, oxyCODONE **OR** oxyCODONE  Antimicrobials: Anti-infectives (From admission, onward)    Start     Dose/Rate Route Frequency Ordered Stop   06/03/21 2200  ceFAZolin (ANCEF) IVPB 1 g/50 mL premix        1 g 100 mL/hr over 30 Minutes Intravenous Every 12 hours 06/03/21 0905     06/01/21 0100  cefTRIAXone (ROCEPHIN) 1 g in sodium chloride 0.9 % 100 mL IVPB  Status:  Discontinued        1 g 200 mL/hr over 30 Minutes Intravenous Every 24 hours 05/31/21 2355 06/03/21 0904  05/29/21 2200  ceFAZolin (ANCEF) IVPB 2g/100 mL premix        2 g 200 mL/hr over 30 Minutes Intravenous Every 12 hours 05/29/21 1456 05/29/21 2150   05/29/21 1150  ceFAZolin (ANCEF) 2-4 GM/100ML-% IVPB       Note to Pharmacy: Myrlene Broker M: cabinet override      05/29/21 1150 05/29/21 1316   05/29/21 0900  ceFAZolin (ANCEF) IVPB 2g/100 mL premix        2 g 200 mL/hr over 30 Minutes Intravenous On call to O.R. 05/29/21 0803 05/29/21 1317        I have personally reviewed the following labs and images: CBC: Recent Labs  Lab 05/29/21 0641 05/30/21 0344 05/31/21 0333 06/01/21 0637 06/02/21 0354 06/03/21 0416 06/04/21 0334   WBC 11.7*   < > 7.7 4.3 7.1 7.8 7.1  NEUTROABS 10.1*  --  6.0 2.8  --   --   --   HGB 10.0*   < > 7.7* 7.5* 11.1* 12.0 10.4*  HCT 31.4*   < > 23.2* 22.6* 32.7* 35.6* 31.2*  MCV 95.4   < > 95.5 95.8 91.1 91.5 92.6  PLT 230   < > 196 175 209 237 286   < > = values in this interval not displayed.   BMP &GFR Recent Labs  Lab 05/31/21 0333 06/01/21 0637 06/02/21 0354 06/03/21 0416 06/04/21 0334  NA 144 141 137 135 134*  K 3.7 3.0* 3.2* 3.7 3.2*  CL 106 108 102 101 100  CO2 32 GLUCOSE 128* 99 100* 126* 117*  BUN 30* CREATININE 0.56 0.44 0.37* 0.33* 0.31*  CALCIUM 8.6* 7.7* 7.9* 8.1* 7.9*  MG 2.1 1.7 1.9 1.9 1.9  PHOS 2.9 2.6 2.4* 2.8 3.2   Estimated Creatinine Clearance: 27.6 mL/min (A) (by C-G formula based on SCr of 0.31 mg/dL (L)). Liver & Pancreas: Recent Labs  Lab 05/29/21 0542 05/31/21 0333 06/01/21 0637 06/02/21 0354 06/03/21 0416 06/04/21 0334  AST 37 21 19  --   --   --   ALT --   --   --   ALKPHOS 89 66 63  --   --   --   BILITOT 1.0 0.4 0.7  --   --   --   PROT 6.3* 5.2* 4.7*  --   --   --   ALBUMIN 3.3* 2.5* 2.4* 2.7* 2.8* 2.5*   No results for input(s): LIPASE, AMYLASE in the last 168 hours. No results for input(s): AMMONIA in the last 168 hours. Diabetic: No results for input(s): HGBA1C in the last 72 hours. No results for input(s): GLUCAP in the last 168 hours. Cardiac Enzymes: Recent Labs  Lab 06/02/21 0354 06/03/21 0416  CKTOTAL 927* 549*   No results for input(s): PROBNP in the last 8760 hours. Coagulation Profile: No results for input(s): INR, PROTIME in the last 168 hours. Thyroid Function Tests: No results for input(s): TSH, T4TOTAL, FREET4, T3FREE, THYROIDAB in the last 72 hours. Lipid Profile: No results for input(s): CHOL, HDL, LDLCALC, TRIG, CHOLHDL, LDLDIRECT in the last 72 hours. Anemia Panel: No results for input(s): VITAMINB12, FOLATE, FERRITIN, TIBC, IRON, RETICCTPCT in the last 72 hours. Urine  analysis:    Component Value Date/Time   COLORURINE YELLOW 05/31/2021 1832   APPEARANCEUR CLEAR 05/31/2021 1832   LABSPEC 1.012 05/31/2021 1832   PHURINE 6.0 05/31/2021 1832   GLUCOSEU NEGATIVE 05/31/2021 1832  HGBUR NEGATIVE 05/31/2021 1832   BILIRUBINUR NEGATIVE 05/31/2021 1832   KETONESUR NEGATIVE 05/31/2021 1832   PROTEINUR NEGATIVE 05/31/2021 1832   NITRITE POSITIVE (A) 05/31/2021 1832   LEUKOCYTESUR NEGATIVE 05/31/2021 1832   Sepsis Labs: Invalid input(s): PROCALCITONIN, LACTICIDVEN  Microbiology: Recent Results (from the past 240 hour(s))  MRSA Next Gen by PCR, Nasal     Status: None   Collection Time: 05/27/21 11:57 PM   Specimen: Nasal Mucosa; Nasal Swab  Result Value Ref Range Status   MRSA by PCR Next Gen NOT DETECTED NOT DETECTED Final    Comment: (NOTE) The GeneXpert MRSA Assay (FDA approved for NASAL specimens only), is one component of a comprehensive MRSA colonization surveillance program. It is not intended to diagnose MRSA infection nor to guide or monitor treatment for MRSA infections. Test performance is not FDA approved in patients less than 44 years old. Performed at [facility], [address], [place], [place] [address]   Surgical pcr screen     Status: None   Collection Time: 05/29/21  8:37 AM   Specimen: Nasal Mucosa; Nasal Swab  Result Value Ref Range Status   MRSA, PCR NEGATIVE NEGATIVE Final   Staphylococcus aureus NEGATIVE NEGATIVE Final    Comment: (NOTE) The Xpert SA Assay (FDA approved for NASAL specimens in patients 59 years of age and older), is one component of a comprehensive surveillance program. It is not intended to diagnose infection nor to guide or monitor treatment. Performed at [facility], [address] [place], [place] [address]   Urine Culture     Status: Abnormal   Collection Time: 06/01/21 12:30 AM   Specimen: Urine, Clean Catch  Result Value Ref Range Status   Specimen  Description   Final    URINE, CLEAN CATCH Performed at [facility], [address], [place], [place] [address]    Special Requests   Final    NONE Performed at [facility], [address] [place], [place] [address]    Culture 80,000 COLONIES/mL ESCHERICHIA COLI (A)  Final   Report Status 06/03/2021 FINAL  Final   Organism ID, Bacteria ESCHERICHIA COLI (A)  Final      Susceptibility   Escherichia coli - MIC*    AMPICILLIN 8 SENSITIVE Sensitive     CEFAZOLIN <=4 SENSITIVE Sensitive     CEFEPIME <=0.12 SENSITIVE Sensitive     CEFTRIAXONE <=0.25 SENSITIVE Sensitive     CIPROFLOXACIN <=0.25 SENSITIVE Sensitive     GENTAMICIN <=1 SENSITIVE Sensitive     IMIPENEM <=0.25 SENSITIVE Sensitive     NITROFURANTOIN <=16 SENSITIVE Sensitive     TRIMETH/SULFA <=20 SENSITIVE Sensitive     AMPICILLIN/SULBACTAM 4 SENSITIVE Sensitive     PIP/TAZO <=4 SENSITIVE Sensitive     * 80,000 COLONIES/mL ESCHERICHIA COLI    Radiology Studies: No results found.    Gumaro Brightbill T. Lulu Hirschmann [facility]  If 7PM-7AM, please contact night-coverage [email or web address] 06/04/2021, 3:33 PM

## 2021-06-04 NOTE — Progress Notes (Signed)
OT Cancellation Note  Patient Details Name: Julie Durham MRN: [id number] DOB: September 14, 1940   Cancelled Treatment:    Reason Eval/Treat Not Completed: Other (comment) Patient d/c from skilled OT services secondary to medical decline. Palliative medicine recommending Residential Hospice  possibly. Patient requiring multiple medications to attempt patient comfort, patient not eating.  Please see latest therapy progress note for current level of functioning and progress toward goals.     Progress and discharge plan discussed with patient and/or caregiver: Patient unable to participate in discharge planning and no caregivers available  Sharyn Blitz OTR/L, [place] Acute Rehabilitation Department Office# [phone] Pager# [phone]  06/04/2021, 2:27 PM

## 2021-06-05 DIAGNOSIS — W1800XA Striking against unspecified object with subsequent fall, initial encounter: Secondary | ICD-10-CM | POA: Diagnosis not present

## 2021-06-05 DIAGNOSIS — Z515 Encounter for palliative care: Secondary | ICD-10-CM | POA: Diagnosis not present

## 2021-06-05 DIAGNOSIS — R410 Disorientation, unspecified: Secondary | ICD-10-CM | POA: Diagnosis not present

## 2021-06-05 DIAGNOSIS — S72001A Fracture of unspecified part of neck of right femur, initial encounter for closed fracture: Secondary | ICD-10-CM | POA: Diagnosis not present

## 2021-06-05 DIAGNOSIS — R627 Adult failure to thrive: Secondary | ICD-10-CM | POA: Diagnosis not present

## 2021-06-05 DIAGNOSIS — Z66 Do not resuscitate: Secondary | ICD-10-CM | POA: Diagnosis not present

## 2021-06-05 DIAGNOSIS — E43 Unspecified severe protein-calorie malnutrition: Secondary | ICD-10-CM | POA: Diagnosis not present

## 2021-06-05 NOTE — Progress Notes (Signed)
PROGRESS NOTE  Julie Durham WGN:[id number] DOB: 06/24/1940   PCP: Pcp, No  Patient is from: Nursing home  [name]: 05/27/2021 LOS: 9  Chief complaints Chief Complaint  Patient presents with   Fall     Brief Narrative / Interim history: 81 year old female with history of dementia, CAD, Takotsubo cardiomyopathy, HTN, recurrent falls, compression fractures and severe malnutrition admitted for right hip and right scapular fracture after fall at nursing home.  She underwent IM fixation for right hip fracture on 5/21.  Postop course complicated by delirium.  Palliative medicine consulted.  Patient remains delirious.  Refusing to take p.o. medications or food.  Palliative medicine recommends residential hospice.  TOC consulted by palliative medicine.    Subjective: Remains confused and restless.  She seems to be hallucinating as well.  She responds no to pain but moaning at times.  Objective: Vitals:   06/05/21 0031 06/05/21 0107 06/05/21 0620 06/05/21 0944  BP: (!) 162/83 (!) 143/87 (!) 167/80 (!) 176/72  Pulse: 73 75 75 84  Resp:    18  Temp:   (!) 97.5 F (36.4 C)   TempSrc:   Axillary   SpO2:   99% 100%  Weight:      Height:        Examination:  GENERAL: Very frail. HEENT: MMM.  Vision and hearing grossly intact.  NECK: Supple.  No apparent JVD.  RESP:  No IWOB.  Fair aeration bilaterally. CVS:  RRR. Heart sounds normal.  ABD/GI/GU: BS+. Abd soft, NTND.  MSK/EXT:  Moves extremities.  Significant muscle mass and subcu fat loss.  Significant kyphosis. SKIN: Skin bruising and RLE NEURO: Awake awake but confused and disoriented.  No apparent focal neuro deficit but limited exam. PSYCH: Confused and seems to be hallucinating.  Procedures:  5/21-IM fixation of right femoral fracture  Microbiology summarized: MRSA PCR screen negative. Urine culture with pansensitive E. coli  Assessment and Plan: * Closed displaced fracture of right femoral neck ([name]) 5/21-IM  fixation right femur Ortho c/s, appreciate assistance WBAT with walker as able Scheduled Tylenol p.o. or suppository Post op DVT ppx per ortho - lovenox Per family, mostly wheelchair bound, but did use walker at times at baseline  Delirium Likely postop delirium in patient with advanced dementia.  No improvement despite interventions.  -Palliative recommends residential hospice and consulted TOC for referral.  Failure to thrive in adult/severe protein calorie malnutrition Body mass index is 13.13 kg/m.  She has very advanced dementia complicated by in-hospital delirium and refusal to take p.o.  She has significant comorbidity.  Poor prognosis.  DNR/DNI is appropriate.  -Palliative medicine following and recommending residential hospice  Fall against object Larey Seat hitting hip on table and on ground with subsequent right hip and scapular fracture.  -Pain control  Hypokalemia and hypomagnesemia Replenish and recheck as appropriate  Acute postoperative anemia due to expected blood loss Stable after 1 unit.  Now leaning toward comfort care.  Discontinue lab monitoring   Osteoporosis She has multiple vertebral compression fractures of different ages.  Not acute.  She is very malnourished -Doubt utility of treatment given grim prognosis  Closed right scapular fracture Likely due to fall.  Plain film with angulated fx at inferior aspect of R scapula Per orthopedics - sling when up, WBAT -> needs f/u with upper extremity specialist  Nonobstructive CAD LHC 10/2018 with mild nonobstructive CAD, mderate segmental LV systolic dysfunction in periapical pattern likely secondary to takotsubo syndrome -IV metoprolol until she is willing to take  p.o. meds. -Transfuse to keep Hgb > 8.0.  Uncontrolled hypertension Improved.  Difficult to get accurate measurement in the setting of agitation/delirium.  Also not taking his Avapro. -Continue IV metoprolol to 5 mg every 6 hours -Labetalol as  needed-need to use this more -Continue Avapro-encouraged to take  Chest pain Difficult to discern due to advanced dementia.  She has history of CAD   Dysphagia Likely due to dementia.  I doubt she has mechanical dysphagia  History of cardiomyopathy Echo 05/2019 with EF 60-65%, grade 1 diastolic dysfunction.  Appears dry on exam.  Not on diuretics at home. -Continue IV fluid hydration   Infestation by bed bug Bed bugs reported on arrival, none noted by EDP Will continue contact precautions for now and monitor closely  DNR (do not resuscitate) Noted on most form (not completely filled out - see media) and confirmed with son  Bladder distention Continue indwelling Foley catheter   Vertebral compression fracture ([name]) CT shows age-indeterminate and old compression fractures with no significant canal stenosis.  This could be due to underlying osteoporosis. -Pain control   DVT prophylaxis:  heparin injection 5,000 Units Start: 05/30/21 0800 SCDs Start: 05/29/21 1457  Code Status: DNR/DNI Family Communication: None at bedside. Level of care: Med-Surg Status is: Inpatient Remains inpatient appropriate because: Due to postop delirium, poor p.o. intake   Final disposition: Residential hospice. Consultants:  Orthopedic surgery Palliative medicine  Sch Meds:  Scheduled Meds:  acetaminophen  650 mg Oral Q6H WA   Or   acetaminophen  650 mg Rectal Q6H WA   Chlorhexidine Gluconate Cloth  6 each Topical Daily   docusate sodium  100 mg Oral BID   feeding supplement  237 mL Oral TID BM   gabapentin  100 mg Oral QHS   heparin injection (subcutaneous)  5,000 Units Subcutaneous Q12H   irbesartan  150 mg Oral Daily   lidocaine  1 patch Transdermal Q24H   metoprolol tartrate  5 mg Intravenous Q6H   multivitamin with minerals  1 tablet Oral Daily   pantoprazole  40 mg Oral Daily   [START ON 06/06/2021] thiamine injection  100 mg Intravenous Daily   Continuous Infusions:    ceFAZolin (ANCEF) IV 1 g (06/04/21 2203)   dextrose 5% lactated ringers with KCl 20 mEq/L 60 mL/hr at 06/05/21 1057   thiamine injection 250 mg (06/04/21 1025)   PRN Meds:.haloperidol lactate, HYDROmorphone (DILAUDID) injection, labetalol, lip balm, menthol-cetylpyridinium **OR** phenol, metoCLOPramide **OR** metoCLOPramide (REGLAN) injection, naLOXone (NARCAN)  injection, ondansetron **OR** ondansetron (ZOFRAN) IV, oxyCODONE **OR** oxyCODONE  Antimicrobials: Anti-infectives (From admission, onward)    Start     Dose/Rate Route Frequency Ordered Stop   06/03/21 2200  ceFAZolin (ANCEF) IVPB 1 g/50 mL premix        1 g 100 mL/hr over 30 Minutes Intravenous Every 12 hours 06/03/21 0905     06/01/21 0100  cefTRIAXone (ROCEPHIN) 1 g in sodium chloride 0.9 % 100 mL IVPB  Status:  Discontinued        1 g 200 mL/hr over 30 Minutes Intravenous Every 24 hours 05/31/21 2355 06/03/21 0904   05/29/21 2200  ceFAZolin (ANCEF) IVPB 2g/100 mL premix        2 g 200 mL/hr over 30 Minutes Intravenous Every 12 hours 05/29/21 1456 05/29/21 2150   05/29/21 1150  ceFAZolin (ANCEF) 2-4 GM/100ML-% IVPB       Note to Pharmacy: Myrlene BrokerHenry, Lois M: cabinet override      05/29/21 1150 05/29/21 1316  05/29/21 0900  ceFAZolin (ANCEF) IVPB 2g/100 mL premix        2 g 200 mL/hr over 30 Minutes Intravenous On call to O.R. 05/29/21 0803 05/29/21 1317        I have personally reviewed the following labs and images: CBC: Recent Labs  Lab 05/31/21 0333 06/01/21 0637 06/02/21 0354 06/03/21 0416 06/04/21 0334  WBC 7.7 4.3 7.1 7.8 7.1  NEUTROABS 6.0 2.8  --   --   --   HGB 7.7* 7.5* 11.1* 12.0 10.4*  HCT 23.2* 22.6* 32.7* 35.6* 31.2*  MCV 95.5 95.8 91.1 91.5 92.6  PLT 196 175 209 237 286   BMP &GFR Recent Labs  Lab 05/31/21 0333 06/01/21 0637 06/02/21 0354 06/03/21 0416 06/04/21 0334  NA 144 141 137 135 134*  K 3.7 3.0* 3.2* 3.7 3.2*  CL 106 108 102 101 100  CO2 32 30 28 26 28   GLUCOSE 128* 99 100* 126*  117*  BUN 30* 17 10 8 9   CREATININE 0.56 0.44 0.37* 0.33* 0.31*  CALCIUM 8.6* 7.7* 7.9* 8.1* 7.9*  MG 2.1 1.7 1.9 1.9 1.9  PHOS 2.9 2.6 2.4* 2.8 3.2   Estimated Creatinine Clearance: 27.6 mL/min (A) (by C-G formula based on SCr of 0.31 mg/dL (L)). Liver & Pancreas: Recent Labs  Lab 05/31/21 0333 06/01/21 06/02/21 06/02/21 0354 06/03/21 0416 06/04/21 0334  AST 21 19  --   --   --   ALT 11 9  --   --   --   ALKPHOS 66 63  --   --   --   BILITOT 0.4 0.7  --   --   --   PROT 5.2* 4.7*  --   --   --   ALBUMIN 2.5* 2.4* 2.7* 2.8* 2.5*   No results for input(s): LIPASE, AMYLASE in the last 168 hours. No results for input(s): AMMONIA in the last 168 hours. Diabetic: No results for input(s): HGBA1C in the last 72 hours. No results for input(s): GLUCAP in the last 168 hours. Cardiac Enzymes: Recent Labs  Lab 06/02/21 0354 06/03/21 0416  CKTOTAL 927* 549*   No results for input(s): PROBNP in the last 8760 hours. Coagulation Profile: No results for input(s): INR, PROTIME in the last 168 hours. Thyroid Function Tests: No results for input(s): TSH, T4TOTAL, FREET4, T3FREE, THYROIDAB in the last 72 hours. Lipid Profile: No results for input(s): CHOL, HDL, LDLCALC, TRIG, CHOLHDL, LDLDIRECT in the last 72 hours. Anemia Panel: No results for input(s): VITAMINB12, FOLATE, FERRITIN, TIBC, IRON, RETICCTPCT in the last 72 hours. Urine analysis:    Component Value Date/Time   COLORURINE YELLOW 05/31/2021 1832   APPEARANCEUR CLEAR 05/31/2021 1832   LABSPEC 1.012 05/31/2021 1832   PHURINE 6.0 05/31/2021 1832   GLUCOSEU NEGATIVE 05/31/2021 1832   HGBUR NEGATIVE 05/31/2021 1832   BILIRUBINUR NEGATIVE 05/31/2021 1832   KETONESUR NEGATIVE 05/31/2021 1832   PROTEINUR NEGATIVE 05/31/2021 1832   NITRITE POSITIVE (A) 05/31/2021 1832   LEUKOCYTESUR NEGATIVE 05/31/2021 1832   Sepsis Labs: Invalid input(s): PROCALCITONIN, LACTICIDVEN  Microbiology: Recent Results (from the past 240 hour(s))   MRSA Next Gen by PCR, Nasal     Status: None   Collection Time: 05/27/21 11:57 PM   Specimen: Nasal Mucosa; Nasal Swab  Result Value Ref Range Status   MRSA by PCR Next Gen NOT DETECTED NOT DETECTED Final    Comment: (NOTE) The GeneXpert MRSA Assay (FDA approved for NASAL specimens only), is one component of a comprehensive  MRSA colonization surveillance program. It is not intended to diagnose MRSA infection nor to guide or monitor treatment for MRSA infections. Test performance is not FDA approved in patients less than 66 years old. Performed at [facility], [address] [place], [place] [address]   Surgical pcr screen     Status: None   Collection Time: 05/29/21  8:37 AM   Specimen: Nasal Mucosa; Nasal Swab  Result Value Ref Range Status   MRSA, PCR NEGATIVE NEGATIVE Final   Staphylococcus aureus NEGATIVE NEGATIVE Final    Comment: (NOTE) The Xpert SA Assay (FDA approved for NASAL specimens in patients 34 years of age and older), is one component of a comprehensive surveillance program. It is not intended to diagnose infection nor to guide or monitor treatment. Performed at [facility], [address] [place], [place] [address]   Urine Culture     Status: Abnormal   Collection Time: 06/01/21 12:30 AM   Specimen: Urine, Clean Catch  Result Value Ref Range Status   Specimen Description   Final    URINE, CLEAN CATCH Performed at [facility], [address], [place], [place] [address]    Special Requests   Final    NONE Performed at [facility], [address] [place], [place] [address]    Culture 80,000 COLONIES/mL ESCHERICHIA COLI (A)  Final   Report Status 06/03/2021 FINAL  Final   Organism ID, Bacteria ESCHERICHIA COLI (A)  Final      Susceptibility   Escherichia coli - MIC*    AMPICILLIN 8 SENSITIVE Sensitive     CEFAZOLIN <=4 SENSITIVE Sensitive     CEFEPIME <=0.12 SENSITIVE  Sensitive     CEFTRIAXONE <=0.25 SENSITIVE Sensitive     CIPROFLOXACIN <=0.25 SENSITIVE Sensitive     GENTAMICIN <=1 SENSITIVE Sensitive     IMIPENEM <=0.25 SENSITIVE Sensitive     NITROFURANTOIN <=16 SENSITIVE Sensitive     TRIMETH/SULFA <=20 SENSITIVE Sensitive     AMPICILLIN/SULBACTAM 4 SENSITIVE Sensitive     PIP/TAZO <=4 SENSITIVE Sensitive     * 80,000 COLONIES/mL ESCHERICHIA COLI    Radiology Studies: No results found.    Thaer Miyoshi T. Blayklee Mable [facility]  If 7PM-7AM, please contact night-coverage [email or web address] 06/05/2021, 10:59 AM

## 2021-06-05 NOTE — Progress Notes (Signed)
Daily Progress Note   Patient Name: Julie BelfastBarbara E Durham       Date: 06/05/2021 DOB: [birth year]  Age: 81 y.o. MRN#: [id number] Attending Physician: Almon HerculesGonfa, Taye T, MD Primary Care Physician: Pcp, No Admit Date: 05/27/2021  Reason for Consultation/Follow-up: Establishing goals of care  Subjective:  Weak appearing elderly frail lady resting in bed, agitated currently.    Length of Stay: 9  Current Medications: Scheduled Meds:   acetaminophen  650 mg Oral Q6H WA   Or   acetaminophen  650 mg Rectal Q6H WA   Chlorhexidine Gluconate Cloth  6 each Topical Daily   docusate sodium  100 mg Oral BID   feeding supplement  237 mL Oral TID BM   gabapentin  100 mg Oral QHS   heparin injection (subcutaneous)  5,000 Units Subcutaneous Q12H   irbesartan  150 mg Oral Daily   lidocaine  1 patch Transdermal Q24H   metoprolol tartrate  5 mg Intravenous Q6H   multivitamin with minerals  1 tablet Oral Daily   pantoprazole  40 mg Oral Daily   [START ON 06/06/2021] thiamine injection  100 mg Intravenous Daily    Continuous Infusions:   ceFAZolin (ANCEF) IV 1 g (06/04/21 2203)   dextrose 5% lactated ringers with KCl 20 mEq/L 60 mL/hr at 06/04/21 1734   thiamine injection 250 mg (06/04/21 1025)    PRN Meds: haloperidol lactate, HYDROmorphone (DILAUDID) injection, labetalol, lip balm, menthol-cetylpyridinium **OR** phenol, metoCLOPramide **OR** metoCLOPramide (REGLAN) injection, naLOXone (NARCAN)  injection, ondansetron **OR** ondansetron (ZOFRAN) IV, oxyCODONE **OR** oxyCODONE  Physical Exam         Frail elderly lady easily agitated Regular work of breathing Abdomen not distended No edema Has dementia  Vital Signs: BP (!) 167/80 (BP Location: Right Arm)   Pulse 75   Temp (!) 97.5 F (36.4 C)  (Axillary)   Resp 18   Ht 4\' 11"  (1.499 m)   Wt 31.7 kg   SpO2 99%   BMI 14.12 kg/m  SpO2: SpO2: 99 % O2 Device: O2 Device: Nasal Cannula O2 Flow Rate: O2 Flow Rate (L/min): 1 L/min  Intake/output summary:  Intake/Output Summary (Last 24 hours) at 06/05/2021 0859 Last data filed at 06/05/2021 [id number] Gross per 24 hour  Intake 2248.94 ml  Output 1000 ml  Net 1248.94 ml  LBM: Last BM Date : 06/04/21 Baseline Weight: Weight: 29.5 kg Most recent weight: Weight: 31.7 kg       Palliative Assessment/Data:      Patient Active Problem List   Diagnosis Date Noted   Hypokalemia and hypomagnesemia 06/01/2021   Hypokalemia 06/01/2021   Hypomagnesemia 06/01/2021   Delirium 05/30/2021   Acute postoperative anemia due to expected blood loss 05/30/2021   Bladder distention 05/29/2021   Closed displaced fracture of right femoral neck ([name]) 05/27/2021   Fall against object 05/27/2021   Dementia ([name]) 05/27/2021   History of cardiomyopathy 05/27/2021   Nonobstructive CAD 05/27/2021   Infestation by bed bug 05/27/2021   DNR (do not resuscitate) 05/27/2021   Failure to thrive in adult/severe protein calorie malnutrition 05/27/2021   Closed right scapular fracture 05/27/2021   Osteoporosis 05/27/2021   Febrile 08/07/2019   Acute lower UTI 01/04/2019   Acute cystitis without hematuria    Acute encephalopathy 01/03/2019   Protein-calorie malnutrition, severe 12/24/2018   Acute pain of right knee    Closed right tibial fracture 12/23/2018   Mild CAD 10/24/2018   S/P kyphoplasty 10/24/2018   Stress fracture of lumbar vertebra 10/24/2018   Systolic dysfunction 10/24/2018   Elevated troponin 10/24/2018   Chest pain 10/24/2018   Brain mass 10/24/2018   Takotsubo syndrome    Vertebral compression fracture ([name]) 10/22/2018   Hyperlipidemia 02/28/2017   Dysphagia 10/04/2016   Other bursal cyst, left elbow 10/04/2016   Pill esophagitis 10/04/2016   Infected insect bite 06/29/2016    Retained foreign body in soft tissue 06/29/2016   Uncontrolled hypertension 11/18/2012   Palpitations 11/18/2012    Palliative Care Assessment & Plan   Patient Profile:    Assessment:  81 year old with a history of dementia who lives at [facility] facility since December 2020, admitted for right hip and a right scapular fracture after fall at nursing home.  She underwent IM fixation for right hip fracture on 5-21.  Palliative medicine team consulted because postop course has been complicated by delirium and to discuss broad goals of care in this context.  Has underlying history of dementia, coronary artery disease, Takotsubo cardiomyopathy, hypertension compression fractures recurrent falls and severe malnutrition. A palliative consult has been requested for ongoing goals of care discussions.   Delirium Severe protein calorie malnutrition Closed displaced R femoral neck fracture   Recommendations/Plan: Pain management with IV Dilaudid PRN. Also has Oxy IR PO PRN.  Low-dose Haldol IV as needed to be used only for severe uncontrolled agitation. Will likely benefit from residential hospice due to increasing pain, poor PO intake.  Will place [facility] consult to facilitate residential hospice.    Code Status:    Code Status Orders  (From admission, onward)           Start     Ordered   05/27/21 1958  Do not attempt resuscitation (DNR)  Continuous       Question Answer Comment  In the event of cardiac or respiratory ARREST Do not call a "code blue"   In the event of cardiac or respiratory ARREST Do not perform Intubation, CPR, defibrillation or ACLS   In the event of cardiac or respiratory ARREST Use medication by any route, position, wound care, and other measures to relive pain and suffering. May use oxygen, suction and manual treatment of airway obstruction as needed for comfort.      05/27/21 1957           Code Status  History     Date Active Date Inactive Code Status  Order ID Comments User Context   01/08/2019 1716 01/09/2019 0521 DNR [id number]  Elease Etienne, MD Inpatient   01/04/2019 0542 01/08/2019 1716 Full Code [id number]  Eduard Clos, MD ED   12/23/2018 1755 12/25/2018 1709 Full Code [id number]  Bobette Mo, MD ED   10/22/2018 1104 10/24/2018 2017 Full Code [id number]  [place] Darci Current, PA-C Inpatient      Advance Directive Documentation    Flowsheet Row Most Recent Value  Type of Advance Directive Out of facility DNR (pink MOST or yellow form)  [Pt has a Most Form that has DNR checked on it. It is at bedside]  Pre-existing out of facility DNR order (yellow form or pink MOST form) --  "MOST" Form in Place? --       Prognosis:  Days to less than 2 weeks in my opinion.   Discharge Planning: Residential hospice.   Care plan was discussed with  IDT  Thank you for allowing the Palliative Medicine Team to assist in the care of this patient.   Time In: 8.30 Time Out: 9.00 Total Time 30 Prolonged Time Billed  no       Greater than 50%  of this time was spent counseling and coordinating care related to the above assessment and plan.  Rosalin Hawking, MD  Please contact Palliative Medicine Team phone at [phone] for questions and concerns.

## 2021-06-05 NOTE — Progress Notes (Signed)
Attempted to administer PO medications x3. The patient is not agreeable . She appears to be agitated and combative at this time.Will continue to encourage and monitor. Provider updated.

## 2021-06-06 DIAGNOSIS — S72001A Fracture of unspecified part of neck of right femur, initial encounter for closed fracture: Secondary | ICD-10-CM | POA: Diagnosis not present

## 2021-06-06 DIAGNOSIS — Z66 Do not resuscitate: Secondary | ICD-10-CM | POA: Diagnosis not present

## 2021-06-06 DIAGNOSIS — E43 Unspecified severe protein-calorie malnutrition: Secondary | ICD-10-CM | POA: Diagnosis not present

## 2021-06-06 DIAGNOSIS — S32000D Wedge compression fracture of unspecified lumbar vertebra, subsequent encounter for fracture with routine healing: Secondary | ICD-10-CM

## 2021-06-06 DIAGNOSIS — W1800XA Striking against unspecified object with subsequent fall, initial encounter: Secondary | ICD-10-CM | POA: Diagnosis not present

## 2021-06-06 DIAGNOSIS — R627 Adult failure to thrive: Secondary | ICD-10-CM | POA: Diagnosis not present

## 2021-06-06 DIAGNOSIS — R410 Disorientation, unspecified: Secondary | ICD-10-CM | POA: Diagnosis not present

## 2021-06-06 DIAGNOSIS — Z515 Encounter for palliative care: Secondary | ICD-10-CM | POA: Diagnosis not present

## 2021-06-06 MED ORDER — GLYCOPYRROLATE 1 MG/5ML PO SOLN: 1.0000 mg | Freq: Four times a day (QID) | ORAL | 0 refills | Status: AC | PRN

## 2021-06-06 MED ORDER — HYDROMORPHONE HCL 1 MG/ML IJ SOLN: 0.5000 mg | INTRAMUSCULAR | 0 refills | Status: AC | PRN

## 2021-06-06 MED ORDER — ONDANSETRON HCL 4 MG/2ML IJ SOLN: 4.0000 mg | Freq: Four times a day (QID) | INTRAMUSCULAR | 0 refills | Status: AC | PRN

## 2021-06-06 MED ORDER — HALOPERIDOL LACTATE 5 MG/ML IJ SOLN: 0.5000 mg | Freq: Four times a day (QID) | INTRAMUSCULAR | Status: AC | PRN

## 2021-06-06 NOTE — Progress Notes (Signed)
   This pt has been referred by [facility] to Hospice services at our Inpatient facility Hosp[facility] in [facility]. I have reviewed the chart, spoken with son Onalee Hua who is in agreement with hospice philosophy and would like to accept bed offer at our facility.I discussed with our MD and the pt has been approved. We can accept pt today.   1205pm I have called [facility] for ambulance pickup. Son is aware.   Norm Parcel RN [phone]

## 2021-06-06 NOTE — Progress Notes (Signed)
PROGRESS NOTE  Julie Durham PJA:[id number] DOB: 03/01/1940   PCP: Pcp, No  Patient is from: Nursing home  [name]: 05/27/2021 LOS: 10  Chief complaints Chief Complaint  Patient presents with   Fall     Brief Narrative / Interim history: 81 year old female with history of dementia, CAD, Takotsubo cardiomyopathy, HTN, recurrent falls, compression fractures and severe malnutrition admitted for right hip and right scapular fracture after fall at nursing home.  She underwent IM fixation for right hip fracture on 5/21.  Postop course complicated by delirium.  Palliative medicine consulted.  Patient remains delirious.  Refusing to take p.o. medications or food.  Palliative medicine recommends residential hospice.  TOC consulted by palliative medicine.    Subjective: Seen and examined earlier this morning.  No major events overnight or this morning.  Remains confused and delirious.  She is sleepy this morning.  No apparent distress.  Objective: Vitals:   06/05/21 2021 06/05/21 2348 06/06/21 0207 06/06/21 0632  BP: (!) 197/87 (!) 187/86 (!) 168/74 (!) 161/87  Pulse: 89  89 91  Resp:    (!) 21  Temp: 97.9 F (36.6 C)   98.6 F (37 C)  TempSrc: Axillary   Oral  SpO2: 97%     Weight:      Height:        Examination:  GENERAL: Frail.  No apparent distress.  Sleeping. HEENT: MMM.  Vision and hearing grossly intact.  NECK: Supple.  No apparent JVD.  RESP:  No IWOB.  Fair aeration bilaterally. CVS:  RRR. Heart sounds normal.  ABD/GI/GU: BS+. Abd soft, NTND.  MSK/EXT: Significant muscle mass and subcu fat loss.  No edema.  SKIN: Bruising and ecchymosis in RLE NEURO: Sleeping.  No apparent focal neuro deficit but limited exam. PSYCH: Sleeping.  No distress or agitation.  Procedures:  5/21-IM fixation of right femoral fracture  Microbiology summarized: MRSA PCR screen negative. Urine culture with pansensitive E. coli  Assessment and Plan: * Closed displaced fracture of right  femoral neck ([name]) 5/21-IM fixation right femur Ortho c/s, appreciate assistance WBAT with walker as able Scheduled Tylenol p.o. or suppository Post op DVT ppx per ortho - lovenox Per family, mostly wheelchair bound, but did use walker at times at baseline  Delirium Likely postop delirium in patient with advanced dementia.  No improvement despite interventions.  -Palliative recommends residential hospice and consulted TOC for referral.  Failure to thrive in adult/severe protein calorie malnutrition Body mass index is 13.13 kg/m.  She has very advanced dementia complicated by in-hospital delirium and refusal to take p.o.  She has significant comorbidity.  Poor prognosis.  DNR/DNI is appropriate.  -Palliative medicine following and recommending residential hospice  Fall against object Larey Seat hitting hip on table and on ground with subsequent right hip and scapular fracture.  -Pain control  Hypokalemia and hypomagnesemia Replenish and recheck as appropriate  Acute postoperative anemia due to expected blood loss Stable after 1 unit.  Now leaning toward comfort care.  Discontinue lab monitoring   Osteoporosis She has multiple vertebral compression fractures of different ages.  Not acute.  She is very malnourished -Doubt utility of treatment given grim prognosis  Closed right scapular fracture Likely due to fall.  Plain film with angulated fx at inferior aspect of R scapula Per orthopedics - sling when up, WBAT -> needs f/u with upper extremity specialist  Nonobstructive CAD LHC 10/2018 with mild nonobstructive CAD, mderate segmental LV systolic dysfunction in periapical pattern likely secondary to takotsubo syndrome -  IV metoprolol until she is willing to take p.o. meds. -Transfuse to keep Hgb > 8.0.  Uncontrolled hypertension Improved.  Difficult to get accurate measurement in the setting of agitation/delirium.  Also not taking his Avapro. -Continue IV metoprolol to 5 mg every 6  hours -Labetalol as needed-need to use this more -Continue Avapro-encouraged to take  Chest pain Difficult to discern due to advanced dementia.  She has history of CAD   Dysphagia Likely due to dementia.  I doubt she has mechanical dysphagia  History of cardiomyopathy Echo 05/2019 with EF 60-65%, grade 1 diastolic dysfunction.  Appears dry on exam.  Not on diuretics at home. -Continue IV fluid hydration   Infestation by bed bug Bed bugs reported on arrival, none noted by EDP Will continue contact precautions for now and monitor closely  DNR (do not resuscitate) Noted on most form (not completely filled out - see media) and confirmed with son  Bladder distention Continue indwelling Foley catheter   Vertebral compression fracture ([name]) CT shows age-indeterminate and old compression fractures with no significant canal stenosis.  This could be due to underlying osteoporosis. -Pain control   DVT prophylaxis:  heparin injection 5,000 Units Start: 05/30/21 0800 SCDs Start: 05/29/21 1457  Code Status: DNR/DNI Family Communication: None at bedside. Level of care: Med-Surg Status is: Inpatient Remains inpatient appropriate because: Due to postop delirium, poor p.o. intake   Final disposition: Residential hospice. Consultants:  Orthopedic surgery Palliative medicine  Sch Meds:  Scheduled Meds:  acetaminophen  650 mg Oral Q6H WA   Or   acetaminophen  650 mg Rectal Q6H WA   Chlorhexidine Gluconate Cloth  6 each Topical Daily   docusate sodium  100 mg Oral BID   feeding supplement  237 mL Oral TID BM   gabapentin  100 mg Oral QHS   heparin injection (subcutaneous)  5,000 Units Subcutaneous Q12H   irbesartan  150 mg Oral Daily   lidocaine  1 patch Transdermal Q24H   metoprolol tartrate  5 mg Intravenous Q6H   multivitamin with minerals  1 tablet Oral Daily   pantoprazole  40 mg Oral Daily   thiamine injection  100 mg Intravenous Daily   Continuous Infusions:   dextrose 5% lactated ringers with KCl 20 mEq/L 60 mL/hr at 06/06/21 0330   PRN Meds:.haloperidol lactate, HYDROmorphone (DILAUDID) injection, labetalol, lip balm, menthol-cetylpyridinium **OR** phenol, metoCLOPramide **OR** metoCLOPramide (REGLAN) injection, naLOXone (NARCAN)  injection, ondansetron **OR** ondansetron (ZOFRAN) IV, oxyCODONE **OR** oxyCODONE  Antimicrobials: Anti-infectives (From admission, onward)    Start     Dose/Rate Route Frequency Ordered Stop   06/03/21 2200  ceFAZolin (ANCEF) IVPB 1 g/50 mL premix  Status:  Discontinued        1 g 100 mL/hr over 30 Minutes Intravenous Every 12 hours 06/03/21 0905 06/06/21 0728   06/01/21 0100  cefTRIAXone (ROCEPHIN) 1 g in sodium chloride 0.9 % 100 mL IVPB  Status:  Discontinued        1 g 200 mL/hr over 30 Minutes Intravenous Every 24 hours 05/31/21 2355 06/03/21 0904   05/29/21 2200  ceFAZolin (ANCEF) IVPB 2g/100 mL premix        2 g 200 mL/hr over 30 Minutes Intravenous Every 12 hours 05/29/21 1456 05/29/21 2150   05/29/21 1150  ceFAZolin (ANCEF) 2-4 GM/100ML-% IVPB       Note to Pharmacy: Myrlene Broker M: cabinet override      05/29/21 1150 05/29/21 1316   05/29/21 0900  ceFAZolin (ANCEF) IVPB 2g/100  mL premix        2 g 200 mL/hr over 30 Minutes Intravenous On call to O.R. 05/29/21 0803 05/29/21 1317        I have personally reviewed the following labs and images: CBC: Recent Labs  Lab 05/31/21 0333 06/01/21 0637 06/02/21 0354 06/03/21 0416 06/04/21 0334  WBC 7.7 4.3 7.1 7.8 7.1  NEUTROABS 6.0 2.8  --   --   --   HGB 7.7* 7.5* 11.1* 12.0 10.4*  HCT 23.2* 22.6* 32.7* 35.6* 31.2*  MCV 95.5 95.8 91.1 91.5 92.6  PLT 196 175 209 237 286   BMP &GFR Recent Labs  Lab 05/31/21 0333 06/01/21 0637 06/02/21 0354 06/03/21 0416 06/04/21 0334  NA 144 141 137 135 134*  K 3.7 3.0* 3.2* 3.7 3.2*  CL 106 108 102 101 100  CO2 32 30 28 26 28   GLUCOSE 128* 99 100* 126* 117*  BUN 30* 17 10 8 9   CREATININE 0.56 0.44 0.37*  0.33* 0.31*  CALCIUM 8.6* 7.7* 7.9* 8.1* 7.9*  MG 2.1 1.7 1.9 1.9 1.9  PHOS 2.9 2.6 2.4* 2.8 3.2   Estimated Creatinine Clearance: 27.6 mL/min (A) (by C-G formula based on SCr of 0.31 mg/dL (L)). Liver & Pancreas: Recent Labs  Lab 05/31/21 0333 06/01/21 [id number] 06/02/21 0354 06/03/21 0416 06/04/21 0334  AST 21 19  --   --   --   ALT 11 9  --   --   --   ALKPHOS 66 63  --   --   --   BILITOT 0.4 0.7  --   --   --   PROT 5.2* 4.7*  --   --   --   ALBUMIN 2.5* 2.4* 2.7* 2.8* 2.5*   No results for input(s): LIPASE, AMYLASE in the last 168 hours. No results for input(s): AMMONIA in the last 168 hours. Diabetic: No results for input(s): HGBA1C in the last 72 hours. No results for input(s): GLUCAP in the last 168 hours. Cardiac Enzymes: Recent Labs  Lab 06/02/21 0354 06/03/21 0416  CKTOTAL 927* 549*   No results for input(s): PROBNP in the last 8760 hours. Coagulation Profile: No results for input(s): INR, PROTIME in the last 168 hours. Thyroid Function Tests: No results for input(s): TSH, T4TOTAL, FREET4, T3FREE, THYROIDAB in the last 72 hours. Lipid Profile: No results for input(s): CHOL, HDL, LDLCALC, TRIG, CHOLHDL, LDLDIRECT in the last 72 hours. Anemia Panel: No results for input(s): VITAMINB12, FOLATE, FERRITIN, TIBC, IRON, RETICCTPCT in the last 72 hours. Urine analysis:    Component Value Date/Time   COLORURINE YELLOW 05/31/2021 1832   APPEARANCEUR CLEAR 05/31/2021 1832   LABSPEC 1.012 05/31/2021 1832   PHURINE 6.0 05/31/2021 1832   GLUCOSEU NEGATIVE 05/31/2021 1832   HGBUR NEGATIVE 05/31/2021 1832   BILIRUBINUR NEGATIVE 05/31/2021 1832   KETONESUR NEGATIVE 05/31/2021 1832   PROTEINUR NEGATIVE 05/31/2021 1832   NITRITE POSITIVE (A) 05/31/2021 1832   LEUKOCYTESUR NEGATIVE 05/31/2021 1832   Sepsis Labs: Invalid input(s): PROCALCITONIN, LACTICIDVEN  Microbiology: Recent Results (from the past 240 hour(s))  MRSA Next Gen by PCR, Nasal     Status: None    Collection Time: 05/27/21 11:57 PM   Specimen: Nasal Mucosa; Nasal Swab  Result Value Ref Range Status   MRSA by PCR Next Gen NOT DETECTED NOT DETECTED Final    Comment: (NOTE) The GeneXpert MRSA Assay (FDA approved for NASAL specimens only), is one component of a comprehensive MRSA colonization surveillance program. It is not  intended to diagnose MRSA infection nor to guide or monitor treatment for MRSA infections. Test performance is not FDA approved in patients less than 62 years old. Performed at [facility], [address], [place], [place] [address]   Surgical pcr screen     Status: None   Collection Time: 05/29/21  8:37 AM   Specimen: Nasal Mucosa; Nasal Swab  Result Value Ref Range Status   MRSA, PCR NEGATIVE NEGATIVE Final   Staphylococcus aureus NEGATIVE NEGATIVE Final    Comment: (NOTE) The Xpert SA Assay (FDA approved for NASAL specimens in patients 84 years of age and older), is one component of a comprehensive surveillance program. It is not intended to diagnose infection nor to guide or monitor treatment. Performed at [facility], [address], [place], [place] [address]   Urine Culture     Status: Abnormal   Collection Time: 06/01/21 12:30 AM   Specimen: Urine, Clean Catch  Result Value Ref Range Status   Specimen Description   Final    URINE, CLEAN CATCH Performed at [facility], [address] [place], [place] [address]    Special Requests   Final    NONE Performed at [facility], [address], [place], [place] [address]    Culture 80,000 COLONIES/mL ESCHERICHIA COLI (A)  Final   Report Status 06/03/2021 FINAL  Final   Organism ID, Bacteria ESCHERICHIA COLI (A)  Final      Susceptibility   Escherichia coli - MIC*    AMPICILLIN 8 SENSITIVE Sensitive     CEFAZOLIN <=4 SENSITIVE Sensitive     CEFEPIME <=0.12 SENSITIVE Sensitive     CEFTRIAXONE <=0.25 SENSITIVE Sensitive      CIPROFLOXACIN <=0.25 SENSITIVE Sensitive     GENTAMICIN <=1 SENSITIVE Sensitive     IMIPENEM <=0.25 SENSITIVE Sensitive     NITROFURANTOIN <=16 SENSITIVE Sensitive     TRIMETH/SULFA <=20 SENSITIVE Sensitive     AMPICILLIN/SULBACTAM 4 SENSITIVE Sensitive     PIP/TAZO <=4 SENSITIVE Sensitive     * 80,000 COLONIES/mL ESCHERICHIA COLI    Radiology Studies: No results found.    Abigaile Rossie T. Nathen Balaban [facility]  If 7PM-7AM, please contact night-coverage [email or web address] 06/06/2021, 11:40 AM

## 2021-06-06 NOTE — TOC Progression Note (Addendum)
Transition of Care [facility]) - Progression Note    Patient Details  Name: LYSHA SCHRADE MRN: [id number] Date of Birth: 12-09-1940  Transition of Care [facility] CM/SW Contact  Golda Acre, RN Phone Number: 06/06/2021, 7:50 AM  Clinical Narrative:    Son wants [facility] for residential care.  Will contact.  Holiday weekend will contact on call. Tct -[facility]-referral made for resid. [place] in [place].  Will have rep. On call call back. T[facility]enn[facility] accept pt at [place] [facility]. Md made aware the patient  pt is dcd. Will arrange transport. Expected Discharge Plan: Hospice Medical Facility Barriers to Discharge: Continued Medical Work up  Expected Discharge Plan and Services Expected Discharge Plan: Hospice Medical Facility   Discharge Planning Services: CM Consult Post Acute Care Choice: Residential Hospice Bed Living arrangements for the past 2 months: Skilled Nursing Facility                                       Social Determinants of Health (SDOH) Interventions    Readmission Risk Interventions     View : No data to display.

## 2021-06-06 NOTE — Discharge Summary (Signed)
Physician Discharge Summary  Julie Durham ZOX:[id number] DOB: September 14, 1940 [name]: 05/27/2021  PCP: Pcp, No  Admit date: 05/27/2021 Discharge date: 06/06/2021 Admitted From: Nursing home Disposition: Residential hospice   Discharge Condition: Poor prognosis CODE STATUS: DNR/DNI  Hospital course 81 year old female with history of dementia, CAD, Takotsubo cardiomyopathy, HTN, recurrent falls, compression fractures and severe malnutrition admitted for right hip and right scapular fracture after fall at nursing home.  She underwent IM fixation for right hip fracture on 5/21.  Postop course complicated by delirium.  Palliative medicine consulted.  Patient remains delirious.  Refusing to take p.o. medications or food.  Palliative medicine consulted and the decision was made to transfer to residential hospice for end-of-life care.   See individual problem list below for more on hospital course.  Problems addressed during this hospitalization Problem  Delirium  Closed Displaced Fracture of Right Femoral Neck ([name])  Fall Against Object  Failure to thrive in adult/severe protein calorie malnutrition  Hypokalemia and hypomagnesemia  Acute Postoperative Anemia Due to Expected Blood Loss  Nonobstructive CAD  Closed Right Scapular Fracture  Osteoporosis  Uncontrolled Hypertension   hypertension    Chest Pain  Dysphagia  History of Cardiomyopathy  Infestation By Bed Bug  Dnr (Do Not Resuscitate)  Bladder Distention  Vertebral Compression Fracture ([name])    Assessment and Plan: * Closed displaced fracture of right femoral neck ([name]) 5/21-IM fixation right femur  Delirium Likely postop delirium in patient with advanced dementia.  No improvement despite interventions.  Transfer to residential hospice for end-of-life care.  Failure to thrive in adult/severe protein calorie malnutrition Body mass index is 13.13 kg/m.  She has very advanced dementia complicated by in-hospital delirium  and refusal to take p.o. very poor prognosis.  Fall against object Fell hitting hip on table and on ground with subsequent right hip and scapular fracture.   Hypokalemia and hypomagnesemia Replenish and recheck as appropriate  Acute postoperative anemia due to expected blood loss Stable after 1 unit.   Osteoporosis She has multiple vertebral compression fractures of different ages.  Not acute.  She is very malnourished. Doubt utility of treatment given grim prognosis  Closed right scapular fracture Likely due to fall.  Plain film with angulated fx at inferior aspect of R scapula Per orthopedics - sling when up, WBAT -> needs f/u with upper extremity specialist  Nonobstructive CAD Stable  Uncontrolled hypertension Improved.  Difficult to get accurate measurement in the setting of agitation/delirium.  Chest pain Difficult to discern due to advanced dementia.  She has history of CAD   Dysphagia Likely due to dementia.  I doubt she has mechanical dysphagia  History of cardiomyopathy Echo 05/2019 with EF 60-65%, grade 1 diastolic dysfunction.  Appears dry on exam.  Not on diuretics at home.  Was on IV fluid   Infestation by bed bug Bed bugs reported on arrival, none noted during this hospitalization.  DNR (do not resuscitate) Noted on most form (not completely filled out - see media) and confirmed with son DNR paper issued on discharge  Bladder distention Continue indwelling Foley catheter   Vertebral compression fracture ([name]) CT shows age-indeterminate and old compression fractures with no significant canal stenosis.  This could be due to underlying osteoporosis.       Nutrition Problem: Severe Malnutrition Etiology: chronic illness (dementia) Signs/Symptoms: severe fat depletion, severe muscle depletion Interventions: Ensure Enlive (each supplement provides 350kcal and 20 grams of protein), MVI     Vital signs Vitals:   06/05/21  2021 06/05/21 2348 06/06/21  0207 06/06/21 0632  BP: (!) 197/87 (!) 187/86 (!) 168/74 (!) 161/87  Pulse: 89  89 91  Temp: 97.9 F (36.6 C)   98.6 F (37 C)  Resp:    (!) 21  Height:      Weight:      SpO2: 97%     TempSrc: Axillary   Oral  BMI (Calculated):         Discharge exam  GENERAL: Frail.  No apparent distress.  Sleeping. HEENT: MMM.  Vision and hearing grossly intact.  NECK: Supple.  No apparent JVD.  RESP:  No IWOB.  Fair aeration bilaterally. CVS:  RRR. Heart sounds normal.  ABD/GI/GU: BS+. Abd soft, NTND.  MSK/EXT: Significant muscle mass and subcu fat loss.  No edema.  SKIN: Bruising and ecchymosis in RLE NEURO: Sleeping.  No apparent focal neuro deficit but limited exam. PSYCH: Sleeping.  No distress or agitation.  Discharge Instructions  Allergies as of 06/06/2021       Reactions   Doxycycline Hyclate Anaphylaxis, Other (See Comments)   Chest pain   Shellfish Allergy Anaphylaxis   Demerol Other (See Comments)   Sedation   Sulfa Antibiotics Other (See Comments)   Unknown rxn        Medication List     STOP taking these medications    acetaminophen 500 MG tablet Commonly known as: TYLENOL   aspirin EC 81 MG tablet   Biofreeze 4 % Gel Generic drug: Menthol (Topical Analgesic)   Calcium Carbonate-Vitamin D 600-400 MG-UNIT tablet   diclofenac Sodium 1 % Gel Commonly known as: VOLTAREN   feeding supplement Liqd   fluticasone 50 MCG/ACT nasal spray Commonly known as: FLONASE   fluticasone-salmeterol 250-50 MCG/ACT Aepb Commonly known as: ADVAIR   gabapentin 100 MG capsule Commonly known as: NEURONTIN   irbesartan 150 MG tablet Commonly known as: AVAPRO   lidocaine 4 %   meloxicam 7.5 MG tablet Commonly known as: MOBIC   metoprolol tartrate 25 MG tablet Commonly known as: LOPRESSOR   nitroGLYCERIN 0.4 MG SL tablet Commonly known as: NITROSTAT   Ocuvite Extra Tabs   omeprazole 20 MG capsule Commonly known as: PRILOSEC   rosuvastatin 5 MG  tablet Commonly known as: CRESTOR       TAKE these medications    Glycopyrrolate 1 MG/5ML Soln Take 5 mLs (1 mg total) by mouth 4 (four) times daily as needed for up to 3 days.   haloperidol lactate 5 MG/ML injection Commonly known as: HALDOL Inject 0.1 mLs (0.5 mg total) into the vein every 6 (six) hours as needed.   HYDROmorphone 1 MG/ML injection Commonly known as: DILAUDID Inject 0.5-1 mLs (0.5-1 mg total) into the vein every 2 (two) hours as needed for severe pain.   ondansetron 4 MG/2ML Soln injection Commonly known as: ZOFRAN Inject 2 mLs (4 mg total) into the vein every 6 (six) hours as needed for nausea.        Consultations: Orthopedic surgery Palliative medicine  Procedures/Studies: 5/21-IM fixation of right femoral fracture   DG Shoulder Right  Result Date: 05/27/2021 CLINICAL DATA:  Recent fall with right shoulder pain, initial encounter EXAM: RIGHT SHOULDER - 2+ VIEW COMPARISON:  None Available. FINDINGS: Humeral head is well seated. Clavicle is well visualized and within normal limits. There is an angulated fracture at the inferior aspect of the scapula incompletely evaluated on this exam. No other focal abnormality is noted. IMPRESSION: Apparent angulated fracture at the inferior aspect  of the right scapula. No other focal abnormality is noted. Electronically Signed   By: Alcide Clever M.D.   On: 05/27/2021 18:47   CT Head Wo Contrast  Result Date: 05/27/2021 CLINICAL DATA:  Head trauma. EXAM: CT HEAD WITHOUT CONTRAST TECHNIQUE: Contiguous axial images were obtained from the base of the skull through the vertex without intravenous contrast. RADIATION DOSE REDUCTION: This exam was performed according to the departmental dose-optimization program which includes automated exposure control, adjustment of the mA and/or kV according to patient size and/or use of iterative reconstruction technique. COMPARISON:  January 04, 2019. FINDINGS: Brain: Mild diffuse cortical  atrophy is noted. Mild chronic ischemic white matter disease is noted. No mass effect or midline shift is noted. Ventricular size is within normal limits. There is no evidence of mass lesion, hemorrhage or acute infarction. Vascular: No hyperdense vessel or unexpected calcification. Skull: Normal. Negative for fracture or focal lesion. Sinuses/Orbits: No acute finding. Other: None. IMPRESSION: No acute intracranial abnormality seen. Electronically Signed   By: Lupita Raider M.D.   On: 05/27/2021 16:39   CT THORACIC SPINE WO CONTRAST  Result Date: 05/29/2021 CLINICAL DATA:  81 year old female status post fall. Hip fracture. EXAM: CT THORACIC SPINE WITHOUT CONTRAST TECHNIQUE: Multidetector CT images of the thoracic were obtained using the standard protocol without intravenous contrast. RADIATION DOSE REDUCTION: This exam was performed according to the departmental dose-optimization program which includes automated exposure control, adjustment of the mA and/or kV according to patient size and/or use of iterative reconstruction technique. COMPARISON:  CTA chest 12/26/2018. FINDINGS: Limited cervical spine imaging: Cervicothoracic junction alignment is within normal limits. Thoracic spine segmentation:  Normal. Alignment: Chronic but increased thoracic kyphosis compared to 12/26/2018. No significant thoracic spondylolisthesis. Vertebrae: Chronic severe osteopenia. Chronic T3 vertebra plana. Chronic T6, and T8 through T11 compression fractures appear stable since 2020. Mild T12 inferior endplate compression also chronic and stable. T1 and T2 appears stable and intact. T4 appears stable and intact. Moderate new T5 compression fractures since 12/26/2018. No retropulsion. T5 posterior elements appear to remain intact. Severe T7 compression fracture is new since 12/26/2018. No significant retropulsion. T7 posterior elements appears stable and intact. Rib osteopenia and mild respiratory motion. No obvious acute posterior  rib fracture. Paraspinal and other soft tissues: Lungs appear hyperinflated. Visible major airways are patent. No pericardial or pleural effusion is evident. Calcified aortic atherosclerosis. Thoracic paraspinal soft tissues remain within normal limits. Disc levels: Despite the extensive thoracic compression fractures, the thoracic spinal canal is capacious. No CT evidence of thoracic spinal stenosis. IMPRESSION: 1. Severe osteopenia with chronic thoracic compression fractures at 7 vertebral levels (including chronic vertebral plana at T3). Moderate T5 and Severe T7 compression fractures are new since 12/26/2018 but otherwise age indeterminate. No retropulsion or complicating features. 2. No CT evidence of thoracic spinal stenosis. 3. Aortic Atherosclerosis (ICD10-I70.0) and Emphysema (ICD10-J43.9). Electronically Signed   By: Odessa Fleming M.D.   On: 05/29/2021 05:46   CT LUMBAR SPINE WO CONTRAST  Result Date: 05/29/2021 CLINICAL DATA:  81 year old female status post fall. Hip fracture. EXAM: CT LUMBAR SPINE WITHOUT CONTRAST TECHNIQUE: Multidetector CT imaging of the lumbar spine was performed without intravenous contrast administration. Multiplanar CT image reconstructions were also generated. RADIATION DOSE REDUCTION: This exam was performed according to the departmental dose-optimization program which includes automated exposure control, adjustment of the mA and/or kV according to patient size and/or use of iterative reconstruction technique. COMPARISON:  Thoracic spine CT today reported separately. Lumbar  MRI 09/15/2018. FINDINGS: Segmentation: Normal, concordant with the thoracic spine numbering today. Alignment: Straightening of lumbar lordosis has not significantly changed since 2020. Vertebrae: Diffuse severe osteopenia. Thoracic levels are reported separately today. Chronically augmented L1 and L4 compression fractures. Chronic severe L2 compression fracture is stable from the 2020 MRI. New moderate to  severe L3 and L5 compression fractures since that time. Mild retropulsion of bone at both levels. Pedicles and posterior elements of both levels appear to remain intact. Grossly intact visible sacrum and SI joints. Paraspinal and other soft tissues: Partially visible urinary bladder distension appears moderate to severe (series 8, image 90). Aortoiliac calcified atherosclerosis. Cholecystectomy clips. Lumbar paraspinal soft tissues remain within normal limits. Disc levels: Underlying capacious spinal canal is demonstrated on the 2020 MRI. Despite the diffuse lumbar compression fractures and some associated retropulsion no significant lumbar spinal stenosis is suspected. IMPRESSION: 1. Diffuse severe osteopenia. Moderate to severe L3 and L5 compression fractures are new since 10/05/2018, otherwise age indeterminate. Mild retropulsion but no significant lumbar spinal stenosis by CT. 2. Other lumbar levels are chronically compressed. L1 and L4 previously augmented. 3. Partially visible distended urinary bladder. Query Urinary Retention and recommend Foley catheter decompression. 4. Thoracic Spine reported separately today. Aortic Atherosclerosis (ICD10-I70.0). Electronically Signed   By: Odessa FlemingH  Hall M.D.   On: 05/29/2021 05:51   DG Lumbar Spine 1 View  Result Date: 05/27/2021 CLINICAL DATA:  Recent fall with low back pain, initial encounter EXAM: LUMBAR SPINE - 1 VIEW COMPARISON:  10/22/2018, 10/05/2018 FINDINGS: Limited single lateral cross-table view of the lumbar spine was obtained. There are changes consistent with prior vertebral augmentation at L4 and L1. Chronic compression fracture is noted at L2 similar to that seen on prior MRI. Apparent L1 and L5 compression deformities are noted new from the prior MRI but possibly chronic in nature. IMPRESSION: Changes of prior vertebral augmentation at L1 and L4. Multilevel compression deformities are noted within the lumbar spine. The exam is limited by severe  osteoporosis. Electronically Signed   By: Alcide CleverMark  Lukens M.D.   On: 05/27/2021 18:45   DG Thoracic Spine 1 View  Result Date: 05/27/2021 CLINICAL DATA:  Fall. EXAM: OPERATIVE THORACIC SPINE cross-table lateral VIEW(S) COMPARISON:  CT of the chest 12/26/2018. FINDINGS: Examination is significantly limited secondary to patient positioning and diffuse osteopenia. Multilevel thoracic compression deformities are likely present, but not well evaluated on this study. These were seen on prior CT. Vertebroplasty changes at L1 are again noted. IMPRESSION: 1. Single lateral view of the thoracic spine is significantly limited secondary to osteopenia. Multilevel compression deformities are likely present as seen on prior CT, but not well evaluated. This could be better evaluated with dedicated thoracic CT if clinically warranted. Electronically Signed   By: Darliss CheneyAmy  Guttmann M.D.   On: 05/27/2021 18:44   Pelvis Portable  Result Date: 05/29/2021 CLINICAL DATA:  ORIF RIGHT hip fracture. EXAM: PORTABLE PELVIS 1-2 VIEWS COMPARISON:  05/28/2021 CT and prior studies FINDINGS: IM nail/screw is noted within the proximal RIGHT femur traversing an intertrochanteric fracture, in near-anatomic alignment and position. No definite complicating features are noted. IMPRESSION: ORIF RIGHT femur fracture, in near-anatomic alignment and position. Electronically Signed   By: Harmon PierJeffrey  Hu M.D.   On: 05/29/2021 14:30   CT HIP RIGHT WO CONTRAST  Result Date: 05/28/2021 CLINICAL DATA:  Right hip fracture EXAM: CT OF THE RIGHT HIP WITHOUT CONTRAST TECHNIQUE: Multidetector CT imaging of the right hip was performed according to the standard protocol. Multiplanar CT image  reconstructions were also generated. RADIATION DOSE REDUCTION: This exam was performed according to the departmental dose-optimization program which includes automated exposure control, adjustment of the mA and/or kV according to patient size and/or use of iterative reconstruction  technique. COMPARISON:  X-ray 05/27/2021 FINDINGS: Bones/Joint/Cartilage Comminuted intertrochanteric fracture of the proximal right femur with varus angulation. No fracture involvement of the femoral head. Hip joint alignment is maintained without dislocation. Remote healed right superior and inferior pubic rami fractures. No pelvic diastasis. Moderate compression fracture of the L5 vertebral body which appears acute or subacute. Partially visualized L3 compression fracture, age indeterminate. Prior cement augmentation of a L4 vertebral body fracture. Bones are diffusely demineralized. Ligaments Suboptimally assessed by CT. Muscles and Tendons No acute musculotendinous abnormality by CT. Soft tissues Negative for hematoma. Diverticular changes of the included sigmoid colon. IMPRESSION: 1. Comminuted intertrochanteric fracture of the proximal right femur with varus angulation. 2. Moderate compression fracture of the L5 vertebral body which appears acute or subacute. 3. Partially visualized L3 compression fracture, age indeterminate. 4. Remote healed right superior and inferior pubic rami fractures. Electronically Signed   By: Duanne Guess D.O.   On: 05/28/2021 14:16   DG CHEST PORT 1 VIEW  Result Date: 05/31/2021 CLINICAL DATA:  Altered mental status. EXAM: PORTABLE CHEST 1 VIEW COMPARISON:  May 27, 2021 FINDINGS: The heart size and mediastinal contours are within normal limits. Aortic atherosclerosis. No focal airspace consolidation. No visible pleural effusion or pneumothorax. Prior lower thoracic vertebral augmentation. IMPRESSION: 1. No acute cardiopulmonary findings. 2.  Aortic Atherosclerosis (ICD10-I70.0). Electronically Signed   By: Maudry Mayhew M.D.   On: 05/31/2021 17:28   DG Chest Portable 1 View  Result Date: 05/27/2021 CLINICAL DATA:  Fall. EXAM: PORTABLE CHEST 1 VIEW COMPARISON:  February 26, 2021 FINDINGS: Elevation of the left hemidiaphragm is identified. The cardiomediastinal  silhouette is normal. No pneumothorax. No nodules or masses. No focal infiltrates or overt edema. No acute abnormalities are identified. IMPRESSION: No active disease. Electronically Signed   By: Gerome Sam III M.D.   On: 05/27/2021 16:09   DG Knee Right Port  Result Date: 05/28/2021 CLINICAL DATA:  Fracture neck of right femur, pain EXAM: PORTABLE RIGHT KNEE - 1-2 VIEW COMPARISON:  05/27/2021 FINDINGS: Osteopenia is seen in bony structures. No displaced fracture or dislocation is seen in the right knee. AP view is less than optimal due to patient's clinical condition. Right knee is kept in flexed position. There is no definite effusion in the suprapatellar bursa. IMPRESSION: No fracture or dislocation is seen in the limited examination of right knee. Electronically Signed   By: Ernie Avena M.D.   On: 05/28/2021 09:45   DG C-Arm 1-60 Min-No Report  Result Date: 05/29/2021 Fluoroscopy was utilized by the requesting physician.  No radiographic interpretation.   DG HIP UNILAT WITH PELVIS 2-3 VIEWS RIGHT  Result Date: 05/29/2021 CLINICAL DATA:  ORIF RIGHT femur fracture. EXAM: DG HIP (WITH OR WITHOUT PELVIS) 2V RIGHT COMPARISON:  None Available. FINDINGS: Intraoperative spot views of the RIGHT hip are submitted postoperatively for interpretation. Intramedullary nail and screw noted traversing an intertrochanteric RIGHT femur fracture. No gross complicating features are identified. IMPRESSION: ORIF RIGHT femur fracture. Electronically Signed   By: Harmon Pier M.D.   On: 05/29/2021 14:31   DG Hip Unilat W or Wo Pelvis 2-3 Views Right  Result Date: 05/27/2021 CLINICAL DATA:  Fall. EXAM: DG HIP (WITH OR WITHOUT PELVIS) 2-3V RIGHT COMPARISON:  Right hip x-ray 07/12/2011. FINDINGS: There is  an acute fracture of the right femoral neck. There is superolateral displacement of the distal fracture fragment. There is no dislocation. The bones are osteopenic. Vertebroplasty changes are seen in the  approximate L4 level. There is likely a healed right inferior pubic ramus fracture. IMPRESSION: 1. Displaced right femoral neck fracture. Electronically Signed   By: Darliss Cheney M.D.   On: 05/27/2021 16:14       The results of significant diagnostics from this hospitalization (including imaging, microbiology, ancillary and laboratory) are listed below for reference.     Microbiology: Recent Results (from the past 240 hour(s))  MRSA Next Gen by PCR, Nasal     Status: None   Collection Time: 05/27/21 11:57 PM   Specimen: Nasal Mucosa; Nasal Swab  Result Value Ref Range Status   MRSA by PCR Next Gen NOT DETECTED NOT DETECTED Final    Comment: (NOTE) The GeneXpert MRSA Assay (FDA approved for NASAL specimens only), is one component of a comprehensive MRSA colonization surveillance program. It is not intended to diagnose MRSA infection nor to guide or monitor treatment for MRSA infections. Test performance is not FDA approved in patients less than 82 years old. Performed at [facility], [address] [place], [place] [address]   Surgical pcr screen     Status: None   Collection Time: 05/29/21  8:37 AM   Specimen: Nasal Mucosa; Nasal Swab  Result Value Ref Range Status   MRSA, PCR NEGATIVE NEGATIVE Final   Staphylococcus aureus NEGATIVE NEGATIVE Final    Comment: (NOTE) The Xpert SA Assay (FDA approved for NASAL specimens in patients 54 years of age and older), is one component of a comprehensive surveillance program. It is not intended to diagnose infection nor to guide or monitor treatment. Performed at [facility], [address], [place], [place] [address]   Urine Culture     Status: Abnormal   Collection Time: 06/01/21 12:30 AM   Specimen: Urine, Clean Catch  Result Value Ref Range Status   Specimen Description   Final    URINE, CLEAN CATCH Performed at [facility], [address] [place], [place] [address]     Special Requests   Final    NONE Performed at [facility], [address], [place], [place] [address]    Culture 80,000 COLONIES/mL ESCHERICHIA COLI (A)  Final   Report Status 06/03/2021 FINAL  Final   Organism ID, Bacteria ESCHERICHIA COLI (A)  Final      Susceptibility   Escherichia coli - MIC*    AMPICILLIN 8 SENSITIVE Sensitive     CEFAZOLIN <=4 SENSITIVE Sensitive     CEFEPIME <=0.12 SENSITIVE Sensitive     CEFTRIAXONE <=0.25 SENSITIVE Sensitive     CIPROFLOXACIN <=0.25 SENSITIVE Sensitive     GENTAMICIN <=1 SENSITIVE Sensitive     IMIPENEM <=0.25 SENSITIVE Sensitive     NITROFURANTOIN <=16 SENSITIVE Sensitive     TRIMETH/SULFA <=20 SENSITIVE Sensitive     AMPICILLIN/SULBACTAM 4 SENSITIVE Sensitive     PIP/TAZO <=4 SENSITIVE Sensitive     * 80,000 COLONIES/mL ESCHERICHIA COLI     Labs:  CBC: Recent Labs  Lab 05/31/21 0333 06/01/21 0637 06/02/21 0354 06/03/21 0416 06/04/21 0334  WBC 7.7 4.3 7.1 7.8 7.1  NEUTROABS 6.0 2.8  --   --   --   HGB 7.7* 7.5* 11.1* 12.0 10.4*  HCT 23.2* 22.6* 32.7* 35.6* 31.2*  MCV 95.5 95.8 91.1 91.5 92.6  PLT  196 175 209 237 286   BMP &GFR Recent Labs  Lab 05/31/21 0333 06/01/21 0637 06/02/21 0354 06/03/21 0416 06/04/21 0334  NA 144 141 137 135 134*  K 3.7 3.0* 3.2* 3.7 3.2*  CL 106 108 102 101 100  CO2 32 GLUCOSE 128* 99 100* 126* 117*  BUN 30* CREATININE 0.56 0.44 0.37* 0.33* 0.31*  CALCIUM 8.6* 7.7* 7.9* 8.1* 7.9*  MG 2.1 1.7 1.9 1.9 1.9  PHOS 2.9 2.6 2.4* 2.8 3.2   Estimated Creatinine Clearance: 27.6 mL/min (A) (by C-G formula based on SCr of 0.31 mg/dL (L)). Liver & Pancreas: Recent Labs  Lab 05/31/21 0333 06/01/21 1610 06/02/21 0354 06/03/21 0416 06/04/21 0334  AST 21 19  --   --   --   ALT 11 9  --   --   --   ALKPHOS 66 63  --   --   --   BILITOT 0.4 0.7  --   --   --   PROT 5.2* 4.7*  --   --   --   ALBUMIN 2.5* 2.4* 2.7* 2.8* 2.5*   No results for  input(s): LIPASE, AMYLASE in the last 168 hours. No results for input(s): AMMONIA in the last 168 hours. Diabetic: No results for input(s): HGBA1C in the last 72 hours. No results for input(s): GLUCAP in the last 168 hours. Cardiac Enzymes: Recent Labs  Lab 06/02/21 0354 06/03/21 0416  CKTOTAL 927* 549*   No results for input(s): PROBNP in the last 8760 hours. Coagulation Profile: No results for input(s): INR, PROTIME in the last 168 hours. Thyroid Function Tests: No results for input(s): TSH, T4TOTAL, FREET4, T3FREE, THYROIDAB in the last 72 hours. Lipid Profile: No results for input(s): CHOL, HDL, LDLCALC, TRIG, CHOLHDL, LDLDIRECT in the last 72 hours. Anemia Panel: No results for input(s): VITAMINB12, FOLATE, FERRITIN, TIBC, IRON, RETICCTPCT in the last 72 hours. Urine analysis:    Component Value Date/Time   COLORURINE YELLOW 05/31/2021 1832   APPEARANCEUR CLEAR 05/31/2021 1832   LABSPEC 1.012 05/31/2021 1832   PHURINE 6.0 05/31/2021 1832   GLUCOSEU NEGATIVE 05/31/2021 1832   HGBUR NEGATIVE 05/31/2021 1832   BILIRUBINUR NEGATIVE 05/31/2021 1832   KETONESUR NEGATIVE 05/31/2021 1832   PROTEINUR NEGATIVE 05/31/2021 1832   NITRITE POSITIVE (A) 05/31/2021 1832   LEUKOCYTESUR NEGATIVE 05/31/2021 1832   Sepsis Labs: Invalid input(s): PROCALCITONIN, LACTICIDVEN   Time coordinating discharge: 45 minutes  SIGNED:  Almon Hercules, MD  [facility] 06/06/2021, 11:50 AM

## 2021-06-06 NOTE — Progress Notes (Signed)
Daily Progress Note   Patient Name: Julie Durham       Date: 06/06/2021 DOB: Oct 11, 1940  Age: 81 y.o. MRN#: [id number] Attending Physician: Almon Hercules, MD Primary Care Physician: Pcp, No Admit Date: 05/27/2021  Reason for Consultation/Follow-up: Establishing goals of care  Subjective:  Weak appearing elderly frail lady resting in bed, continues to require IV Dilaudid PRN for pain.    Length of Stay: 10  Current Medications: Scheduled Meds:   acetaminophen  650 mg Oral Q6H WA   Or   acetaminophen  650 mg Rectal Q6H WA   Chlorhexidine Gluconate Cloth  6 each Topical Daily   docusate sodium  100 mg Oral BID   feeding supplement  237 mL Oral TID BM   gabapentin  100 mg Oral QHS   heparin injection (subcutaneous)  5,000 Units Subcutaneous Q12H   irbesartan  150 mg Oral Daily   lidocaine  1 patch Transdermal Q24H   metoprolol tartrate  5 mg Intravenous Q6H   multivitamin with minerals  1 tablet Oral Daily   pantoprazole  40 mg Oral Daily   thiamine injection  100 mg Intravenous Daily    Continuous Infusions:  dextrose 5% lactated ringers with KCl 20 mEq/L 60 mL/hr at 06/06/21 0330    PRN Meds: haloperidol lactate, HYDROmorphone (DILAUDID) injection, labetalol, lip balm, menthol-cetylpyridinium **OR** phenol, metoCLOPramide **OR** metoCLOPramide (REGLAN) injection, naLOXone (NARCAN)  injection, ondansetron **OR** ondansetron (ZOFRAN) IV, oxyCODONE **OR** oxyCODONE  Physical Exam         Frail elderly lady easily agitated Regular work of breathing Abdomen not distended No edema Has dementia  Vital Signs: BP (!) 161/87 (BP Location: Right Arm)   Pulse 91   Temp 98.6 F (37 C) (Oral)   Resp (!) 21   Ht 4\' 11"  (1.499 m)   Wt 31.7 kg   SpO2 97%   BMI 14.12 kg/m   SpO2: SpO2: 97 % O2 Device: O2 Device: Room Air O2 Flow Rate: O2 Flow Rate (L/min): 1 L/min  Intake/output summary:  Intake/Output Summary (Last 24 hours) at 06/06/2021 1124 Last data filed at 06/06/2021 0900 Gross per 24 hour  Intake 0 ml  Output 950 ml  Net -950 ml    LBM: Last BM Date : (P) 06/04/21 Baseline Weight: Weight: 29.5 kg Most recent weight:  Weight: 31.7 kg       Palliative Assessment/Data:      Patient Active Problem List   Diagnosis Date Noted   Hypokalemia and hypomagnesemia 06/01/2021   Hypokalemia 06/01/2021   Hypomagnesemia 06/01/2021   Delirium 05/30/2021   Acute postoperative anemia due to expected blood loss 05/30/2021   Bladder distention 05/29/2021   Closed displaced fracture of right femoral neck ([name]) 05/27/2021   Fall against object 05/27/2021   Dementia ([name]) 05/27/2021   History of cardiomyopathy 05/27/2021   Nonobstructive CAD 05/27/2021   Infestation by bed bug 05/27/2021   DNR (do not resuscitate) 05/27/2021   Failure to thrive in adult/severe protein calorie malnutrition 05/27/2021   Closed right scapular fracture 05/27/2021   Osteoporosis 05/27/2021   Febrile 08/07/2019   Acute lower UTI 01/04/2019   Acute cystitis without hematuria    Acute encephalopathy 01/03/2019   Protein-calorie malnutrition, severe 12/24/2018   Acute pain of right knee    Closed right tibial fracture 12/23/2018   Mild CAD 10/24/2018   S/P kyphoplasty 10/24/2018   Stress fracture of lumbar vertebra 10/24/2018   Systolic dysfunction 10/24/2018   Elevated troponin 10/24/2018   Chest pain 10/24/2018   Brain mass 10/24/2018   Takotsubo syndrome    Vertebral compression fracture ([name]) 10/22/2018   Hyperlipidemia 02/28/2017   Dysphagia 10/04/2016   Other bursal cyst, left elbow 10/04/2016   Pill esophagitis 10/04/2016   Infected insect bite 06/29/2016   Retained foreign body in soft tissue 06/29/2016   Uncontrolled hypertension 11/18/2012    Palpitations 11/18/2012    Palliative Care Assessment & Plan   Patient Profile:    Assessment:  81 year old with a history of dementia [facility], admitted for right hip and a right scapular fracture after fall at nursing home.  She underwent IM fixation for right hip fracture on 5-21.  Palliative medicine team consulted because postop course has been complicated by delirium and to discuss broad goals of care in this context.  Has underlying history of dementia, coronary artery disease, Takotsubo cardiomyopathy, hypertension compression fractures recurrent falls and severe malnutrition. A palliative consult has been requested for ongoing goals of care discussions.   Delirium Severe protein calorie malnutrition Closed displaced R femoral neck fracture   Recommendations/Plan: Pain management with IV Dilaudid PRN. Also has Oxy IR PO PRN.  Low-dose Haldol IV as needed to be used only for severe uncontrolled agitation. Will likely benefit from residential hospice due to increasing pain, poor PO intake. Continues to require IV Dilaudid PRN for pain. Medication history reviewed.  appreciate TOC consult to facilitate residential hospice.    Code Status:    Code Status Orders  (From admission, onward)           Start     Ordered   05/27/21 1958  Do not attempt resuscitation (DNR)  Continuous       Question Answer Comment  In the event of cardiac or respiratory ARREST Do not call a "code blue"   In the event of cardiac or respiratory ARREST Do not perform Intubation, CPR, defibrillation or ACLS   In the event of cardiac or respiratory ARREST Use medication by any route, position, wound care, and other measures to relive pain and suffering. May use oxygen, suction and manual treatment of airway obstruction as needed for comfort.      05/27/21 1957           Code Status History  Date Active Date Inactive Code Status Order ID  Comments User Context   01/08/2019 1716 01/09/2019 0521 DNR [id number]  Elease Etienne, MD Inpatient   01/04/2019 0542 01/08/2019 1716 Full Code [id number]  Eduard Clos, MD ED   12/23/2018 1755 12/25/2018 1709 Full Code [id number]  Bobette Mo, MD ED   10/22/2018 1104 10/24/2018 2017 Full Code [id number]  [place] Darci Current, PA-C Inpatient      Advance Directive Documentation    Flowsheet Row Most Recent Value  Type of Advance Directive Out of facility DNR (pink MOST or yellow form)  [Pt has a Most Form that has DNR checked on it. It is at bedside]  Pre-existing out of facility DNR order (yellow form or pink MOST form) --  "MOST" Form in Place? --       Prognosis:  Days to less than 2 weeks in my opinion.   Discharge Planning: Residential hospice.   Care plan was discussed with  IDT  Thank you for allowing the Palliative Medicine Team to assist in the care of this patient.   Time In: 8.30 Time Out: 9.00 Total Time 30 Prolonged Time Billed  no       Greater than 50%  of this time was spent counseling and coordinating care related to the above assessment and plan.  Rosalin Hawking, MD  Please contact Palliative Medicine Team phone at [phone] for questions and concerns.

## 2021-07-09 DEATH — deceased

## 2023-05-20 IMAGING — CR DG CHEST 2V
2 series · 2 of 2 positions shown · non-contrast
Comparison: 01/03/2019

CLINICAL DATA: Chest pain and lower back pain

EXAM:
CHEST - 2 VIEW

[chest lat]
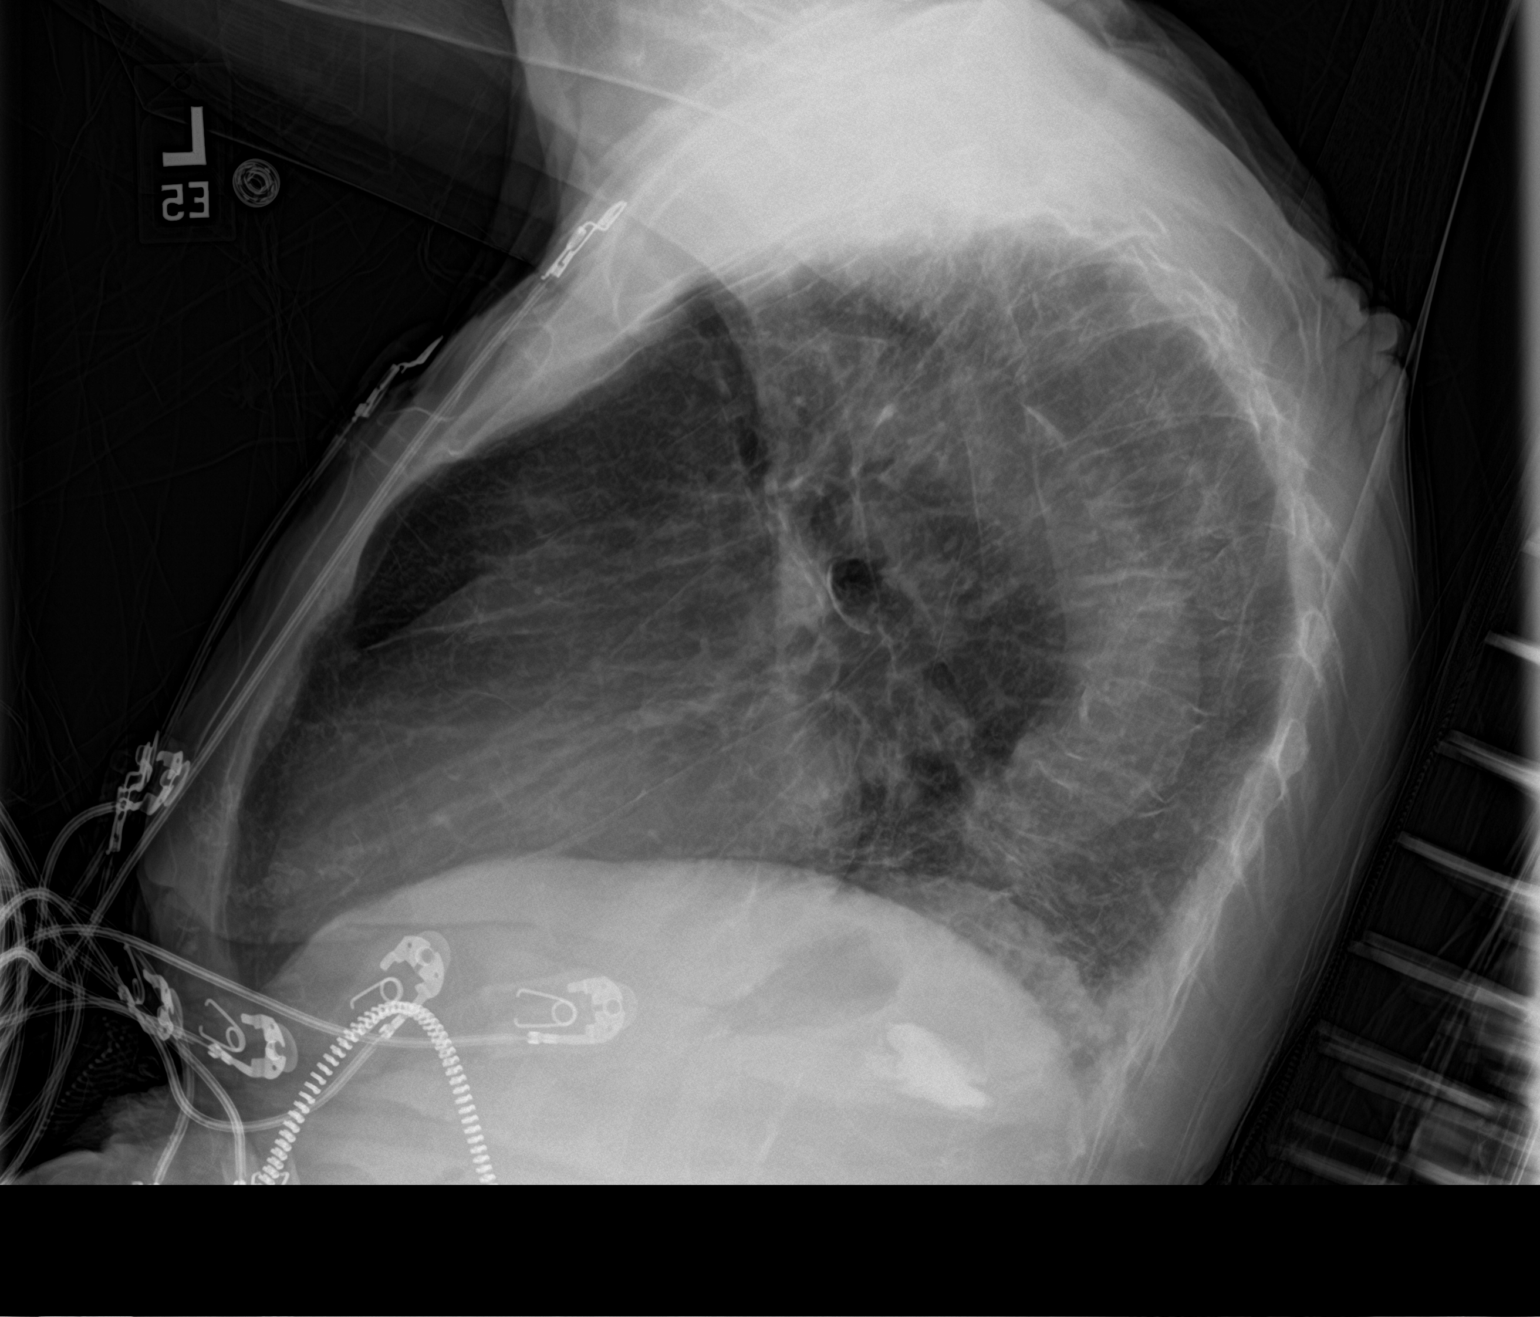

[chest ap]
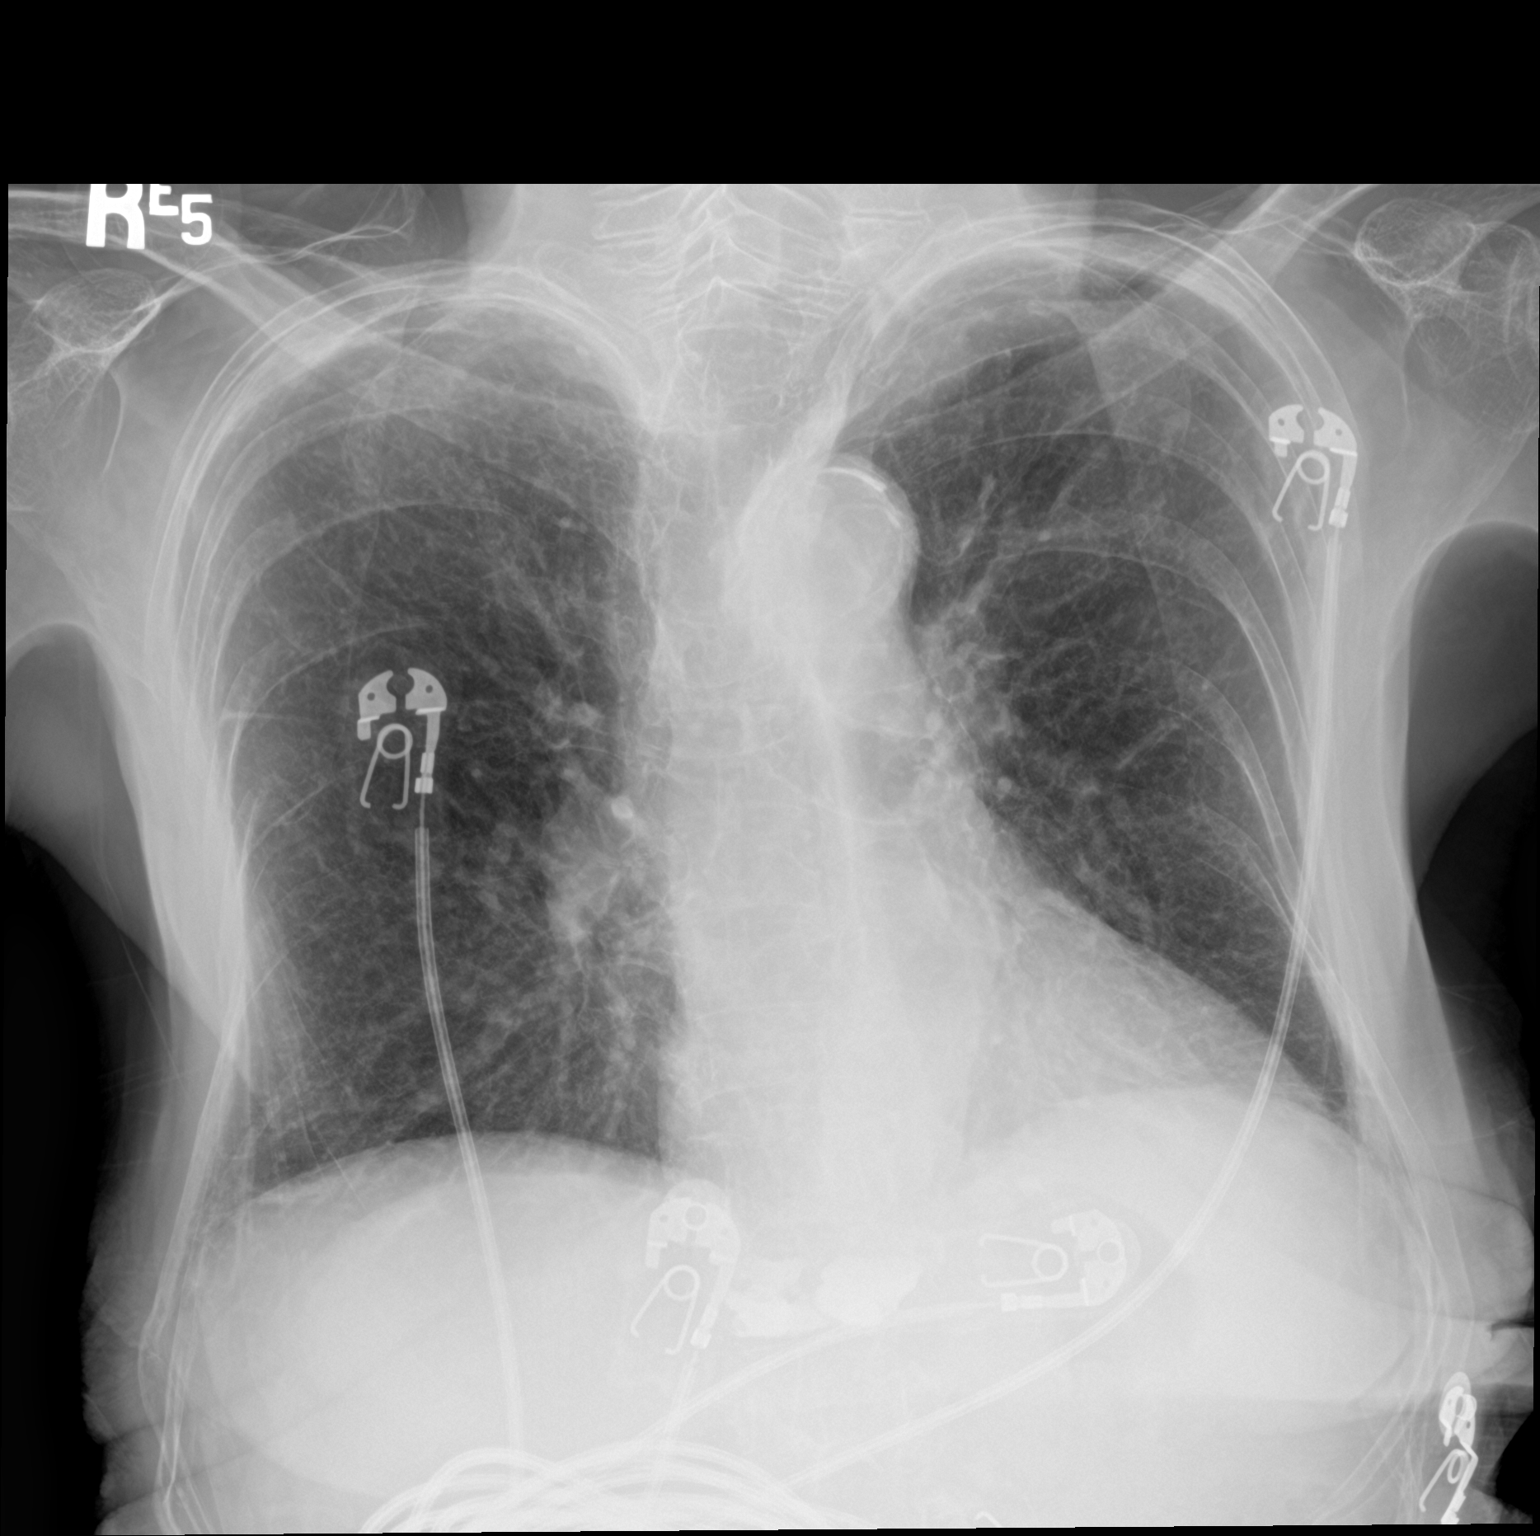

[2 of 2 positions shown; findings below may reference images not displayed]

FINDINGS: Heart size appears normal. Aortic atherosclerosis. The lungs are
clear. No airspace opacity or signs of atelectasis.

The bones appear diffusely osteopenic. There is thoracic kyphosis
deformity secondary to numerous chronic compression fractures
throughout the thoracic spine. Remote healed sternal manubrial
fracture.
IMPRESSION: No acute cardiopulmonary abnormalities.
